# Patient Record
Sex: Female | Born: 1950 | Race: Black or African American | Hispanic: No | Smoking: Never smoker
Health system: Southern US, Community
[De-identification: ages and names within clinical notes are randomized; demographics above are authoritative.]

## PROBLEM LIST (undated history)

## (undated) DIAGNOSIS — C801 Malignant (primary) neoplasm, unspecified: Secondary | ICD-10-CM

## (undated) DIAGNOSIS — E739 Lactose intolerance, unspecified: Secondary | ICD-10-CM

## (undated) DIAGNOSIS — E119 Type 2 diabetes mellitus without complications: Secondary | ICD-10-CM

## (undated) DIAGNOSIS — J309 Allergic rhinitis, unspecified: Secondary | ICD-10-CM

## (undated) DIAGNOSIS — J45909 Unspecified asthma, uncomplicated: Secondary | ICD-10-CM

## (undated) DIAGNOSIS — T7840XA Allergy, unspecified, initial encounter: Secondary | ICD-10-CM

## (undated) DIAGNOSIS — E785 Hyperlipidemia, unspecified: Secondary | ICD-10-CM

## (undated) DIAGNOSIS — C50919 Malignant neoplasm of unspecified site of unspecified female breast: Secondary | ICD-10-CM

## (undated) DIAGNOSIS — M1990 Unspecified osteoarthritis, unspecified site: Secondary | ICD-10-CM

## (undated) DIAGNOSIS — R51 Headache: Secondary | ICD-10-CM

## (undated) DIAGNOSIS — K219 Gastro-esophageal reflux disease without esophagitis: Secondary | ICD-10-CM

## (undated) DIAGNOSIS — Z5189 Encounter for other specified aftercare: Secondary | ICD-10-CM

## (undated) DIAGNOSIS — I10 Essential (primary) hypertension: Secondary | ICD-10-CM

## (undated) DIAGNOSIS — E669 Obesity, unspecified: Secondary | ICD-10-CM

## (undated) DIAGNOSIS — D649 Anemia, unspecified: Secondary | ICD-10-CM

## (undated) HISTORY — DX: Encounter for other specified aftercare: Z51.89

## (undated) HISTORY — PX: KNEE SURGERY: SHX244

## (undated) HISTORY — DX: Anemia, unspecified: D64.9

## (undated) HISTORY — DX: Headache: R51

## (undated) HISTORY — DX: Lactose intolerance, unspecified: E73.9

## (undated) HISTORY — DX: Obesity, unspecified: E66.9

## (undated) HISTORY — DX: Unspecified asthma, uncomplicated: J45.909

## (undated) HISTORY — PX: BREAST LUMPECTOMY: SHX2

## (undated) HISTORY — DX: Allergic rhinitis, unspecified: J30.9

## (undated) HISTORY — PX: ABDOMINAL HYSTERECTOMY: SHX81

## (undated) HISTORY — DX: Essential (primary) hypertension: I10

## (undated) HISTORY — DX: Allergy, unspecified, initial encounter: T78.40XA

## (undated) HISTORY — DX: Unspecified osteoarthritis, unspecified site: M19.90

## (undated) HISTORY — PX: COLONOSCOPY: SHX174

## (undated) HISTORY — DX: Hyperlipidemia, unspecified: E78.5

---

## 1989-02-22 DIAGNOSIS — Z5189 Encounter for other specified aftercare: Secondary | ICD-10-CM

## 1989-02-22 HISTORY — DX: Encounter for other specified aftercare: Z51.89

## 1997-06-27 ENCOUNTER — Other Ambulatory Visit: Admission: RE | Admit: 1997-06-27 | Discharge: 1997-06-27 | Payer: Self-pay | Admitting: Obstetrics and Gynecology

## 1998-07-16 ENCOUNTER — Ambulatory Visit (HOSPITAL_COMMUNITY): Admission: RE | Admit: 1998-07-16 | Discharge: 1998-07-16 | Payer: Self-pay | Admitting: Obstetrics and Gynecology

## 1998-07-16 ENCOUNTER — Encounter: Payer: Self-pay | Admitting: Obstetrics and Gynecology

## 1998-07-18 ENCOUNTER — Other Ambulatory Visit: Admission: RE | Admit: 1998-07-18 | Discharge: 1998-07-18 | Payer: Self-pay | Admitting: Obstetrics and Gynecology

## 1999-08-07 ENCOUNTER — Encounter: Payer: Self-pay | Admitting: Obstetrics and Gynecology

## 1999-08-07 ENCOUNTER — Ambulatory Visit (HOSPITAL_COMMUNITY): Admission: RE | Admit: 1999-08-07 | Discharge: 1999-08-07 | Payer: Self-pay | Admitting: Obstetrics and Gynecology

## 2000-08-08 ENCOUNTER — Ambulatory Visit (HOSPITAL_COMMUNITY): Admission: RE | Admit: 2000-08-08 | Discharge: 2000-08-08 | Payer: Self-pay | Admitting: Obstetrics and Gynecology

## 2000-08-08 ENCOUNTER — Encounter: Payer: Self-pay | Admitting: Obstetrics and Gynecology

## 2001-11-24 ENCOUNTER — Other Ambulatory Visit: Admission: RE | Admit: 2001-11-24 | Discharge: 2001-11-24 | Payer: Self-pay | Admitting: Obstetrics and Gynecology

## 2001-12-06 ENCOUNTER — Encounter: Payer: Self-pay | Admitting: Obstetrics and Gynecology

## 2001-12-06 ENCOUNTER — Ambulatory Visit (HOSPITAL_COMMUNITY): Admission: RE | Admit: 2001-12-06 | Discharge: 2001-12-06 | Payer: Self-pay | Admitting: Family Medicine

## 2002-12-21 ENCOUNTER — Ambulatory Visit (HOSPITAL_COMMUNITY): Admission: RE | Admit: 2002-12-21 | Discharge: 2002-12-21 | Payer: Self-pay | Admitting: Obstetrics and Gynecology

## 2003-12-23 ENCOUNTER — Ambulatory Visit (HOSPITAL_COMMUNITY): Admission: RE | Admit: 2003-12-23 | Discharge: 2003-12-23 | Payer: Self-pay | Admitting: Obstetrics and Gynecology

## 2004-10-25 ENCOUNTER — Encounter: Admission: RE | Admit: 2004-10-25 | Discharge: 2004-10-25 | Payer: Self-pay | Admitting: Emergency Medicine

## 2004-12-25 ENCOUNTER — Ambulatory Visit (HOSPITAL_COMMUNITY): Admission: RE | Admit: 2004-12-25 | Discharge: 2004-12-25 | Payer: Self-pay | Admitting: Emergency Medicine

## 2005-04-21 ENCOUNTER — Encounter: Admission: RE | Admit: 2005-04-21 | Discharge: 2005-04-21 | Payer: Self-pay | Admitting: Emergency Medicine

## 2005-05-05 ENCOUNTER — Encounter: Admission: RE | Admit: 2005-05-05 | Discharge: 2005-05-05 | Payer: Self-pay | Admitting: Emergency Medicine

## 2005-12-28 ENCOUNTER — Ambulatory Visit (HOSPITAL_COMMUNITY): Admission: RE | Admit: 2005-12-28 | Discharge: 2005-12-28 | Payer: Self-pay | Admitting: Emergency Medicine

## 2006-02-22 LAB — HM COLONOSCOPY

## 2006-08-05 ENCOUNTER — Ambulatory Visit (HOSPITAL_COMMUNITY): Admission: RE | Admit: 2006-08-05 | Discharge: 2006-08-05 | Payer: Self-pay | Admitting: Gastroenterology

## 2007-08-14 ENCOUNTER — Encounter: Payer: Self-pay | Admitting: Internal Medicine

## 2007-12-18 ENCOUNTER — Ambulatory Visit (HOSPITAL_COMMUNITY): Admission: RE | Admit: 2007-12-18 | Discharge: 2007-12-18 | Payer: Self-pay | Admitting: Obstetrics and Gynecology

## 2008-05-15 ENCOUNTER — Ambulatory Visit: Payer: Self-pay | Admitting: Internal Medicine

## 2008-05-15 DIAGNOSIS — R519 Headache, unspecified: Secondary | ICD-10-CM | POA: Insufficient documentation

## 2008-05-15 DIAGNOSIS — M1990 Unspecified osteoarthritis, unspecified site: Secondary | ICD-10-CM | POA: Insufficient documentation

## 2008-05-15 DIAGNOSIS — I10 Essential (primary) hypertension: Secondary | ICD-10-CM

## 2008-05-15 DIAGNOSIS — E739 Lactose intolerance, unspecified: Secondary | ICD-10-CM

## 2008-05-15 DIAGNOSIS — J45909 Unspecified asthma, uncomplicated: Secondary | ICD-10-CM

## 2008-05-15 DIAGNOSIS — R51 Headache: Secondary | ICD-10-CM

## 2008-05-15 DIAGNOSIS — E119 Type 2 diabetes mellitus without complications: Secondary | ICD-10-CM | POA: Insufficient documentation

## 2008-05-15 DIAGNOSIS — J309 Allergic rhinitis, unspecified: Secondary | ICD-10-CM | POA: Insufficient documentation

## 2008-05-15 DIAGNOSIS — J4520 Mild intermittent asthma, uncomplicated: Secondary | ICD-10-CM | POA: Insufficient documentation

## 2008-05-15 HISTORY — DX: Essential (primary) hypertension: I10

## 2008-05-15 HISTORY — DX: Allergic rhinitis, unspecified: J30.9

## 2008-05-15 HISTORY — DX: Unspecified asthma, uncomplicated: J45.909

## 2008-05-15 HISTORY — DX: Headache: R51

## 2008-05-15 HISTORY — DX: Unspecified osteoarthritis, unspecified site: M19.90

## 2008-05-15 HISTORY — DX: Lactose intolerance, unspecified: E73.9

## 2008-12-19 ENCOUNTER — Ambulatory Visit (HOSPITAL_COMMUNITY): Admission: RE | Admit: 2008-12-19 | Discharge: 2008-12-19 | Payer: Self-pay | Admitting: Emergency Medicine

## 2009-05-22 ENCOUNTER — Telehealth: Payer: Self-pay | Admitting: Internal Medicine

## 2009-06-05 ENCOUNTER — Ambulatory Visit: Payer: Self-pay | Admitting: Internal Medicine

## 2009-06-05 LAB — CONVERTED CEMR LAB
Bilirubin Urine: NEGATIVE
Blood in Urine, dipstick: NEGATIVE
Glucose, Urine, Semiquant: NEGATIVE
Ketones, urine, test strip: NEGATIVE
Nitrite: NEGATIVE
Protein, U semiquant: NEGATIVE
Specific Gravity, Urine: 1.02
Urobilinogen, UA: 0.2
WBC Urine, dipstick: NEGATIVE
pH: 5.5

## 2009-06-06 LAB — CONVERTED CEMR LAB
ALT: 22 units/L (ref 0–35)
AST: 22 units/L (ref 0–37)
Albumin: 4 g/dL (ref 3.5–5.2)
Alkaline Phosphatase: 64 units/L (ref 39–117)
BUN: 10 mg/dL (ref 6–23)
Basophils Absolute: 0 10*3/uL (ref 0.0–0.1)
Basophils Relative: 0.5 % (ref 0.0–3.0)
Bilirubin, Direct: 0 mg/dL (ref 0.0–0.3)
CO2: 30 meq/L (ref 19–32)
Calcium: 9.6 mg/dL (ref 8.4–10.5)
Chloride: 102 meq/L (ref 96–112)
Cholesterol: 192 mg/dL (ref 0–200)
Creatinine, Ser: 0.6 mg/dL (ref 0.4–1.2)
Eosinophils Absolute: 0.1 10*3/uL (ref 0.0–0.7)
Eosinophils Relative: 1.9 % (ref 0.0–5.0)
GFR calc non Af Amer: 130.84 mL/min (ref >60)
Glucose, Bld: 134 mg/dL — ABNORMAL HIGH (ref 70–99)
HCT: 39.2 % (ref 36.0–46.0)
HDL: 48.8 mg/dL (ref >39.00)
Hemoglobin: 13.6 g/dL (ref 12.0–15.0)
LDL Cholesterol: 119 mg/dL — ABNORMAL HIGH (ref 0–99)
Lymphocytes Relative: 26.8 % (ref 12.0–46.0)
Lymphs Abs: 1.8 10*3/uL (ref 0.7–4.0)
MCHC: 34.7 g/dL (ref 30.0–36.0)
MCV: 92.4 fL (ref 78.0–100.0)
Monocytes Absolute: 0.5 10*3/uL (ref 0.1–1.0)
Monocytes Relative: 7.8 % (ref 3.0–12.0)
Neutro Abs: 4.2 10*3/uL (ref 1.4–7.7)
Neutrophils Relative %: 63 % (ref 43.0–77.0)
Platelets: 202 10*3/uL (ref 150.0–400.0)
Potassium: 2.9 meq/L — ABNORMAL LOW (ref 3.5–5.1)
RBC: 4.24 M/uL (ref 3.87–5.11)
RDW: 13.8 % (ref 11.5–14.6)
Sodium: 143 meq/L (ref 135–145)
TSH: 1.44 microintl units/mL (ref 0.35–5.50)
Total Bilirubin: 0.4 mg/dL (ref 0.3–1.2)
Total CHOL/HDL Ratio: 4
Total Protein: 8 g/dL (ref 6.0–8.3)
Triglycerides: 122 mg/dL (ref 0.0–149.0)
VLDL: 24.4 mg/dL (ref 0.0–40.0)
WBC: 6.6 10*3/uL (ref 4.5–10.5)

## 2009-06-12 ENCOUNTER — Ambulatory Visit: Payer: Self-pay | Admitting: Internal Medicine

## 2009-09-03 ENCOUNTER — Ambulatory Visit (HOSPITAL_COMMUNITY): Admission: RE | Admit: 2009-09-03 | Discharge: 2009-09-03 | Payer: Self-pay | Admitting: Obstetrics & Gynecology

## 2009-12-11 ENCOUNTER — Ambulatory Visit: Payer: Self-pay | Admitting: Internal Medicine

## 2009-12-22 ENCOUNTER — Ambulatory Visit (HOSPITAL_COMMUNITY): Admission: RE | Admit: 2009-12-22 | Discharge: 2009-12-22 | Payer: Self-pay | Admitting: Internal Medicine

## 2009-12-22 LAB — HM MAMMOGRAPHY: HM Mammogram: NEGATIVE
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
## 2010-03-26 NOTE — Assessment & Plan Note (Signed)
Summary: 6 month fup//ccm   Vital Signs:  Mclaughlin profile:   60 year old female Height:      63 inches Weight:      272 pounds BMI:     48.36 Temp:     98.2 degrees F oral Pulse rate:   80 / minute Resp:     14 per minute BP sitting:   120 / 70  (left arm) Cuff size:   large  Vitals Entered By: Willy Eddy, LPN (December 11, 2009 10:39 AM) CC: 6 month roa Is Mclaughlin Diabetic? No   CC:  6 month roa.  History of Present Illness: Darlene Mclaughlin who is seen today for follow up of her hypertension.  She has a history of exogenous obesity and has lost 8 pounds since her last visit here.  She has a history of headaches and a history of mild episodic asthma.  No new concerns or complaints.  She feels quite well.  Today, no asthma  Preventive Screening-Counseling & Management  Alcohol-Tobacco     Smoking Status: never  Current Problems (verified): 1)  Preventive Health Care  (ICD-V70.0) 2)  Impaired Glucose Tolerance  (ICD-271.3) 3)  Osteoarthritis  (ICD-715.90) 4)  Hypertension  (ICD-401.9) 5)  Headache  (ICD-784.0) 6)  Asthma  (ICD-493.90) 7)  Allergic Rhinitis  (ICD-477.9)  Current Medications (verified): 1)  Meloxicam 15 Mg Tabs (Meloxicam) .Marland Kitchen.. 1 Once Daily 2)  Cyclobenzaprine Hcl 5 Mg Tabs (Cyclobenzaprine Hcl) .Marland Kitchen.. 1 Three Times A Day As Needed 3)  Chlorthalidone 25 Mg Tabs (Chlorthalidone) .Marland Kitchen.. 1 Once Daily 4)  Klor-Con M20 20 Meq Cr-Tabs (Potassium Chloride Crys Cr) .... Two Times A Day For 2 Weeks Then Qd  Allergies (verified): 1)  ! Naproxen Dr (Naproxen)  Contraindications/Deferment of Procedures/Staging:    Test/Procedure: FLU VAX    Reason for deferment: Mclaughlin declined   Past History:  Past Medical History: Reviewed history from 05/15/2008 and no changes required. Allergic rhinitis Asthma Headache Hypertension Osteoarthritis impaired glucose tolerance exogenous obesity  Past Surgical History: Reviewed history from 06/12/2009 and no  changes required. gravida two, para two, abortus zero, status post C-sections 1972 in 1979 Hysterectomy 1992 arthroscopic left knee surgery x 2 colonoscopy [address])  Review of Systems       The Mclaughlin complains of weight loss.  The Mclaughlin denies anorexia, fever, weight gain, vision loss, decreased hearing, hoarseness, chest pain, syncope, dyspnea on exertion, peripheral edema, prolonged cough, headaches, hemoptysis, abdominal pain, melena, hematochezia, severe indigestion/heartburn, hematuria, incontinence, genital sores, muscle weakness, suspicious skin lesions, transient blindness, difficulty walking, depression, unusual weight change, abnormal bleeding, enlarged lymph nodes, angioedema, and breast masses.    Physical Exam  General:  overweight-appearing.  130/78overweight-appearing.   Head:  Normocephalic and atraumatic without obvious abnormalities. No apparent alopecia or balding. Eyes:  No corneal or conjunctival inflammation noted. EOMI. Perrla. Funduscopic exam benign, without hemorrhages, exudates or papilledema. Vision grossly normal. Ears:  External ear exam shows no significant lesions or deformities.  Otoscopic examination reveals clear canals, tympanic membranes are intact bilaterally without bulging, retraction, inflammation or discharge. Hearing is grossly normal bilaterally. Mouth:  Oral mucosa and oropharynx without lesions or exudates.  Teeth in good repair. Neck:  No deformities, masses, or tenderness noted. Lungs:  Normal respiratory effort, chest expands symmetrically. Lungs are clear to auscultation, no crackles or wheezes. Heart:  Normal rate and regular rhythm. S1 and S2 normal without gallop, murmur, click, rub or other extra sounds. Abdomen:  Bowel sounds positive,abdomen  soft and non-tender without masses, organomegaly or hernias noted. Msk:  No deformity or scoliosis noted of thoracic or lumbar spine.   Pulses:  R and L carotid,radial,femoral,dorsalis pedis and  posterior tibial pulses are full and equal bilaterally   Impression & Recommendations:  Problem # 1:  IMPAIRED GLUCOSE TOLERANCE (ICD-271.3)  Problem # 2:  OSTEOARTHRITIS (ICD-715.90)  Her updated medication list for this problem includes:    Meloxicam 15 Mg Tabs (Meloxicam) .Marland Kitchen... 1 once daily  Her updated medication list for this problem includes:    Meloxicam 15 Mg Tabs (Meloxicam) .Marland Kitchen... 1 once daily  Problem # 3:  HYPERTENSION (ICD-401.9)  Her updated medication list for this problem includes:    Chlorthalidone 25 Mg Tabs (Chlorthalidone) .Marland Kitchen... 1 once daily  Her updated medication list for this problem includes:    Chlorthalidone 25 Mg Tabs (Chlorthalidone) .Marland Kitchen... 1 once daily  Complete Medication List: 1)  Meloxicam 15 Mg Tabs (Meloxicam) .Marland Kitchen.. 1 once daily 2)  Cyclobenzaprine Hcl 5 Mg Tabs (Cyclobenzaprine hcl) .Marland Kitchen.. 1 three times a day as needed 3)  Chlorthalidone 25 Mg Tabs (Chlorthalidone) .Marland Kitchen.. 1 once daily 4)  Klor-con M20 20 Meq Cr-tabs (Potassium chloride crys cr) .... Two times a day for 2 weeks then qd  Mclaughlin Instructions: 1)  Please schedule a follow-up appointment in 6 months for CPX  2)  Limit your Sodium (Salt) to less than 2 grams a day(slightly less than 1/2 a teaspoon) to prevent fluid retention, swelling, or worsening of symptoms. 3)  It is important that you exercise regularly at least 20 minutes 5 times a week. If you develop chest pain, have severe difficulty breathing, or feel very tired , stop exercising immediately and seek medical attention. 4)  You need to lose weight. Consider a lower calorie diet and regular exercise.  Prescriptions: KLOR-CON M20 20 MEQ CR-TABS (POTASSIUM CHLORIDE CRYS CR) two times a day for 2 weeks then qd  #90 x 6   Entered and Authorized by:   Gordy Savers  MD   Signed by:   Gordy Savers  MD on 12/11/2009   Method used:   [facility]        [facility] [address] [phone]* (retail)       [address]       [place], [place]  [address]       Ph: [phone] or [phone]       Fax: [phone]   RxID:   [id number] CHLORTHALIDONE 25 MG TABS (CHLORTHALIDONE) 1 once daily  #90 x 5   Entered and Authorized by:   Gordy Savers  MD   Signed by:   Gordy Savers  MD on 12/11/2009   Method used:   [facility]        [facility]  [address] [phone]* (retail)       [address]       [place], [place]  [address]       Ph: [phone] or [phone]       Fax: [phone]   RxID:   [id number] CYCLOBENZAPRINE HCL 5 MG TABS (CYCLOBENZAPRINE HCL) 1 three times a day as needed  #90 x 4   Entered and Authorized by:   Gordy Savers  MD   Signed by:   Gordy Savers  MD on 12/11/2009   Method used:   [facility]  [facility]        [facility] [address] [phone]* (retail)       [address]       [place], [place]  [address]       Ph: [phone] or [phone]       Fax: [phone]   RxID:   [id number] MELOXICAM 15 MG TABS (MELOXICAM) 1 once daily  #90 x 6   Entered and Authorized by:   Gordy Savers  MD   Signed by:   Gordy Savers  MD on 12/11/2009   Method used:   [facility]        [facility] [address] [phone]* (retail)       [address]       [place], [place]  [address]       Ph: [phone] or [phone]       Fax: [phone]   RxID:   [id number]    Orders Added: 1)  Est. [facility] Level III [[address]]

## 2010-03-26 NOTE — Progress Notes (Signed)
Summary: refill chlorthalidone  Phone Note Call from Patient Call back at Home Phone [phone]   Caller: Patient Call For: Darlene Savers  MD Summary of Call: chlorthalidone 25 mg call [facility] [address] [phone] pt has 2 pills left. Initial call taken by: [name],  May 22, 2009 9:22 AM    Prescriptions: CHLORTHALIDONE 25 MG TABS (CHLORTHALIDONE) 1 once daily  #30 x 1   Entered by:   [name] LPN   Authorized by:   Darlene Savers  MD   Signed by:   [name] LPN on 44/04/4740   Method used:   [facility]        [facility]  [address] [phone]* (retail)       [address]       [place], [place]  [address]       Ph: [phone] or [phone]       Fax: [phone]   RxID:   [id number]

## 2010-03-26 NOTE — Letter (Signed)
Summary: Records from Dr. Janeth Rase Office 2007 thru 2009  Records from Dr. Janeth Rase Office 2007 thru 2009   Imported By: Maryln Gottron 05/22/2008 13:19:26  _____________________________________________________________________  External Attachment:    Type:   Image     Comment:   External Document

## 2010-03-26 NOTE — Assessment & Plan Note (Signed)
Summary: CPX // RS   Vital Signs:  Patient profile:   60 year old female Height:      63 inches Weight:      280 pounds BMI:     49.78 Temp:     98.0 degrees F oral BP sitting:   118 / 80  (left arm) Cuff size:   large  Vitals Entered By: [name] LPN (June 12, 2009 1:13 PM) CC: cpx - pap 05/2009 norn , mammo 01/2009 norm , colo 2008 norm return 5-10 yrs Is Patient Diabetic? No   CC:  cpx - pap 05/2009 norn , mammo 01/2009 norm , and colo 2008 norm return 5-10 yrs.  History of Present Illness: 60 year old female, whose medical problems include hypertension impaired glucose tolerance.  Exogenous obesity, and arthritis.  She has a history of mild asthma and allergic rhinitis, which has been stable.  She has done quite well over the past year.  She has been adjusted to the death of her husband from cerebrovascular disease in September of last year.  She is a recent grandmother.  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  Preventive Screening-Counseling & Management  Alcohol-Tobacco     Smoking Status: never  Allergies: 1)  ! Naproxen Dr (Naproxen)  Past History:  Past Medical History: Reviewed history from 05/15/2008 and no changes required. Allergic rhinitis Asthma Headache Hypertension Osteoarthritis impaired glucose tolerance exogenous obesity  Past Surgical History: gravida two, para two, abortus zero, status post C-sections 1972 in 1979 Hysterectomy 1992 arthroscopic left knee surgery x 2 colonoscopy [address])  Family History: Reviewed history from 05/15/2008 and no changes required. father died at 83, a sudden cardiac death with a history of hypercholesterolemia mother, age 62 has type 2 diabetes and hypertension  Four brothers, one deceased from complications of diabetes also positive for hypertension no sisters  Social History: Reviewed history from 05/15/2008 and no changes required. 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    Physical Exam  General:  overweight-appearing.  110/74 Head:  Normocephalic and atraumatic without obvious abnormalities. No apparent alopecia or balding. Eyes:  No corneal or conjunctival inflammation noted. EOMI. Perrla. Funduscopic exam benign, without hemorrhages, exudates or papilledema. Vision grossly normal. Ears:  External ear exam shows no significant lesions or deformities.  Otoscopic examination reveals clear canals, tympanic membranes are intact bilaterally without bulging, retraction, inflammation or discharge. Hearing is grossly normal bilaterally. Nose:  External nasal examination shows no deformity or inflammation. Nasal mucosa are pink and moist without lesions or exudates. Mouth:  Oral mucosa and oropharynx without lesions or exudates.  Teeth in good repair. Neck:  No deformities, masses, or tenderness noted. Chest Wall:  No deformities, masses, or tenderness noted. Breasts:  No mass, nodules, thickening, tenderness, bulging, retraction, inflamation, nipple discharge or skin changes noted.   Lungs:  Normal respiratory effort, chest expands symmetrically. Lungs are clear to auscultation, no crackles or wheezes. Heart:  Normal rate and regular rhythm. S1 and  S2 normal without gallop, murmur, click, rub or other extra  sounds. Abdomen:  Bowel sounds positive,abdomen soft and non-tender without masses, organomegaly or hernias noted. Msk:  No deformity or scoliosis noted of thoracic or lumbar spine.   Pulses:  R and L carotid,radial,femoral,dorsalis pedis and posterior tibial pulses are full and equal bilaterally Extremities:  No clubbing, cyanosis, edema, or deformity noted with normal full range of motion of all joints.   Neurologic:  No cranial nerve deficits noted. Station and gait are normal. Plantar reflexes are down-going bilaterally. DTRs are symmetrical throughout. Sensory, motor and coordinative functions appear intact. Skin:  Intact without suspicious lesions or rashes Cervical Nodes:  No lymphadenopathy noted Axillary Nodes:  No palpable lymphadenopathy Inguinal Nodes:  No significant adenopathy Psych:  Cognition and judgment appear intact. Alert and cooperative with normal attention span and concentration. No apparent delusions, illusions, hallucinations   Impression & Recommendations:  Problem # 1:  Preventive Health Care (ICD-V70.0)  Complete Medication List: 1)  Meloxicam 15 Mg Tabs (Meloxicam) .Marland Kitchen.. 1 once daily 2)  Cyclobenzaprine Hcl 5 Mg Tabs (Cyclobenzaprine hcl) .Marland Kitchen.. 1 three times a day as needed 3)  Chlorthalidone 25 Mg Tabs (Chlorthalidone) .Marland Kitchen.. 1 once daily 4)  Klor-con M20 20 Meq Cr-tabs (Potassium chloride crys cr) .... Two times a day for 2 weeks then qd  Other Orders: EKG w/ Interpretation (93000) Prescription Created Electronically [id number])  Patient Instructions: 1)  Limit your Sodium (Salt). 2)  It is important that you exercise regularly at least 20 minutes 5 times a week. If you develop chest pain, have severe difficulty breathing, or feel very tired , stop exercising immediately and seek medical attention. 3)  You need to lose weight. Consider a lower calorie diet and regular exercise.  4)  Check your Blood Pressure regularly. If it is above: you should make an  appointment. 5)  Please schedule a follow-up appointment in 6 months. Prescriptions: KLOR-CON M20 20 MEQ CR-TABS (POTASSIUM CHLORIDE CRYS CR) two times a day for 2 weeks then qd  #90 x 6   Entered and Authorized by:   Gordy Savers  MD   Signed by:   Gordy Savers  MD on 06/12/2009   Method used:   [facility]        [facility]  [address] [phone]* (retail)       [address]       [place], [place]  [address]       Ph: [phone] or [phone]       Fax: [phone]   RxID:   [id number] CHLORTHALIDONE 25 MG TABS (CHLORTHALIDONE) 1 once daily  #90 x 6   Entered and Authorized by:   Gordy Savers  MD   Signed by:   Gordy Savers  MD on 06/12/2009   Method used:   [facility]        [facility]  [address] [phone]* (retail)       [address]       [place], [place]  [address]       Ph: [phone] or [phone]       Fax: [phone]   RxID:   [id number] MELOXICAM 15 MG TABS (MELOXICAM) 1 once daily  #90 x 6   Entered and Authorized by:   Gordy Savers  MD   Signed by:   Gordy Savers  MD on 06/12/2009   Method used:   [facility] to        [facility] [phone]* (retail)       [address]       [place], [place]  [address]       Ph: [phone] or [phone]       Fax: [phone]   RxID:   [id number]

## 2010-06-15 ENCOUNTER — Encounter: Payer: Self-pay | Admitting: Internal Medicine

## 2010-06-17 ENCOUNTER — Encounter: Payer: Self-pay | Admitting: Internal Medicine

## 2010-06-18 ENCOUNTER — Encounter: Payer: Self-pay | Admitting: Internal Medicine

## 2010-06-18 ENCOUNTER — Ambulatory Visit (INDEPENDENT_AMBULATORY_CARE_PROVIDER_SITE_OTHER): Payer: PRIVATE HEALTH INSURANCE | Admitting: Internal Medicine

## 2010-06-18 VITALS — BP 124/82 | HR 90 | Temp 98.1°F | Resp 20 | Ht 63.5 in | Wt 273.0 lb

## 2010-06-18 DIAGNOSIS — I10 Essential (primary) hypertension: Secondary | ICD-10-CM

## 2010-06-18 DIAGNOSIS — E739 Lactose intolerance, unspecified: Secondary | ICD-10-CM

## 2010-06-18 DIAGNOSIS — M1990 Unspecified osteoarthritis, unspecified site: Secondary | ICD-10-CM

## 2010-06-18 DIAGNOSIS — Z0000 Encounter for general adult medical examination without abnormal findings: Secondary | ICD-10-CM

## 2010-06-18 LAB — BASIC METABOLIC PANEL
BUN: 11 mg/dL (ref 6–23)
CO2: 30 mEq/L (ref 19–32)
Calcium: 9.3 mg/dL (ref 8.4–10.5)
Chloride: 99 mEq/L (ref 96–112)
Creatinine, Ser: 0.6 mg/dL (ref 0.4–1.2)
GFR: 125.55 mL/min (ref >60.00)
Glucose, Bld: 165 mg/dL — ABNORMAL HIGH (ref 70–99)
Potassium: 3.4 mEq/L — ABNORMAL LOW (ref 3.5–5.1)
Sodium: 140 mEq/L (ref 135–145)

## 2010-06-18 LAB — HEPATIC FUNCTION PANEL
ALT: 18 U/L (ref 0–35)
AST: 20 U/L (ref 0–37)
Albumin: 3.7 g/dL (ref 3.5–5.2)
Alkaline Phosphatase: 68 U/L (ref 39–117)
Bilirubin, Direct: 0.1 mg/dL (ref 0.0–0.3)
Total Bilirubin: 0.7 mg/dL (ref 0.3–1.2)
Total Protein: 7.3 g/dL (ref 6.0–8.3)

## 2010-06-18 LAB — CBC WITH DIFFERENTIAL/PLATELET
Basophils Absolute: 0 10*3/uL (ref 0.0–0.1)
Basophils Relative: 0.3 % (ref 0.0–3.0)
Eosinophils Absolute: 0.1 10*3/uL (ref 0.0–0.7)
Eosinophils Relative: 1.8 % (ref 0.0–5.0)
HCT: 38.4 % (ref 36.0–46.0)
Hemoglobin: 13.1 g/dL (ref 12.0–15.0)
Lymphocytes Relative: 27 % (ref 12.0–46.0)
Lymphs Abs: 1.8 10*3/uL (ref 0.7–4.0)
MCHC: 34 g/dL (ref 30.0–36.0)
MCV: 93.3 fl (ref 78.0–100.0)
Monocytes Absolute: 0.5 10*3/uL (ref 0.1–1.0)
Monocytes Relative: 7.8 % (ref 3.0–12.0)
Neutro Abs: 4.2 10*3/uL (ref 1.4–7.7)
Neutrophils Relative %: 63.1 % (ref 43.0–77.0)
Platelets: 190 10*3/uL (ref 150.0–400.0)
RBC: 4.11 Mil/uL (ref 3.87–5.11)
RDW: 13.7 % (ref 11.5–14.6)
WBC: 6.7 10*3/uL (ref 4.5–10.5)

## 2010-06-18 LAB — HEMOGLOBIN A1C: Hgb A1c MFr Bld: 8.9 % — ABNORMAL HIGH (ref 4.6–6.5)

## 2010-06-18 LAB — LIPID PANEL
Cholesterol: 190 mg/dL (ref 0–200)
HDL: 47.6 mg/dL (ref >39.00)
LDL Cholesterol: 119 mg/dL — ABNORMAL HIGH (ref 0–99)
Total CHOL/HDL Ratio: 4
Triglycerides: 117 mg/dL (ref 0.0–149.0)
VLDL: 23.4 mg/dL (ref 0.0–40.0)

## 2010-06-18 LAB — TSH: TSH: 1.02 u[IU]/mL (ref 0.35–5.50)

## 2010-06-18 MED ORDER — CHLORTHALIDONE 25 MG PO TABS: 25.0000 mg | ORAL_TABLET | Freq: Every day | ORAL | Status: DC

## 2010-06-18 MED ORDER — POTASSIUM CHLORIDE CRYS ER 20 MEQ PO TBCR: 20.0000 meq | EXTENDED_RELEASE_TABLET | Freq: Every day | ORAL | Status: DC

## 2010-06-18 MED ORDER — CYCLOBENZAPRINE HCL 5 MG PO TABS: 5.0000 mg | ORAL_TABLET | Freq: Three times a day (TID) | ORAL | Status: DC | PRN

## 2010-06-18 NOTE — Progress Notes (Signed)
Subjective:    Patient ID: Darlene Mclaughlin, female    DOB: March 29, 1950, 60 y.o.   MRN: [id number]  HPI This is a 60 year old patient who is seen today for a wellness exam. She was seen by gynecology 2 days ago. She has a history of fibroids and is status post remote hysterectomy. She has hypertension mild asthma and allergic rhinitis which have been stable. She has exogenous obesity and a history of impaired glucose tolerance she has mild osteoarthritis. Her blood pressure has been well controlled on diuretic therapy. Family history is remarkable for a history of breast cancer hypertension and diabetes    CC: cpx - pap 05/2009 norn , mammo 01/2009 norm , and colo 2008 norm return 5-10 yrs.  History of Present Illness:  60 year old female, whose medical problems include hypertension impaired glucose tolerance. Exogenous obesity, and arthritis. She has a history of mild asthma and allergic rhinitis, which has been stable. She has done quite well over the past year. She has been adjusted to the death of her husband from cerebrovascular disease in September of last year. She is a recent grandmother. 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  Preventive Screening-Counseling & Management  Alcohol-Tobacco  Smoking Status: never  Allergies:  1) ! Naproxen Dr (Naproxen)  Past History:  Past Medical History:  Reviewed history from 05/15/2008 and no changes required.  Allergic rhinitis  Asthma  Headache  Hypertension  Osteoarthritis  impaired glucose tolerance  exogenous obesity  Past Surgical History:  gravida two, para two, abortus zero, status post C-sections 1972 in 1979  Hysterectomy 1992  arthroscopic left knee surgery x 2  colonoscopy [address])  Family History:  Reviewed history from 05/15/2008 and no changes required.  father died at 8, a sudden cardiac death with a history of hypercholesterolemia  mother, age 63 has type 2 diabetes and  hypertension  Four brothers, one deceased from complications of diabetes also positive for hypertension  no sisters  Social History:  Reviewed history from 05/15/2008 and no changes required.  Married-widowed 9-10  husband is disabled due to left brain stroke with chronic right hip  two sons- one grandchild  school bus driver  Never Smoked  Review of Systems   Review of Systems  Constitutional: Negative for fever, appetite change, fatigue and unexpected weight change.  HENT: Negative for hearing loss, ear pain, nosebleeds, congestion, sore throat, mouth sores, trouble swallowing, neck stiffness, dental problem, voice change, sinus pressure and tinnitus.   Eyes: Negative for photophobia, pain, redness and visual disturbance.  Respiratory: Negative for cough, chest tightness and shortness of breath.   Cardiovascular: Negative for chest pain, palpitations and leg swelling.  Gastrointestinal: Negative for nausea, vomiting, abdominal pain, diarrhea, constipation, blood in stool, abdominal distention and rectal pain.  Genitourinary: Negative for dysuria, urgency, frequency, hematuria, flank pain, vaginal bleeding, vaginal discharge, difficulty urinating, genital sores, vaginal pain, menstrual problem and pelvic pain.  Musculoskeletal: Negative for back pain and arthralgias.  Skin: Negative for rash.  Neurological: Negative for dizziness, syncope, speech difficulty, weakness, light-headedness, numbness and headaches.  Hematological: Negative for adenopathy. Does not bruise/bleed easily.  Psychiatric/Behavioral: Negative for suicidal ideas, behavioral problems, self-injury, dysphoric mood and agitation. The patient is not nervous/anxious.        Objective:   Physical Exam  Constitutional: She is oriented to person, place, and time. She appears well-developed and well-nourished.       Morbid obesity  HENT:  Head: Normocephalic  and atraumatic.  Right Ear: External ear normal.  Left Ear:  External ear normal.  Mouth/Throat: Oropharynx is clear and moist.  Eyes: Conjunctivae and EOM are normal.  Neck: Normal range of motion. Neck supple. No JVD present. No thyromegaly present.  Cardiovascular: Normal rate, regular rhythm, normal heart sounds and intact distal pulses.   No murmur heard. Pulmonary/Chest: Effort normal and breath sounds normal. She has no wheezes. She has no rales.  Abdominal: Soft. Bowel sounds are normal. She exhibits no distension and no mass. There is no tenderness. There is no rebound and no guarding.  Musculoskeletal: Normal range of motion. She exhibits no edema and no tenderness.  Neurological: She is alert and oriented to person, place, and time. She has normal reflexes. No cranial nerve deficit. She exhibits normal muscle tone. Coordination normal.  Skin: Skin is warm and dry. No rash noted.  Psychiatric: She has a normal mood and affect. Her behavior is normal.          Assessment & Plan:   Annual health exam Exogenous obesity History of impaired glucose tolerance. We'll check a hemoglobin A1c Hypertension stable Mild osteoarthritis stable  Laboratory data reviewed. Will recheck in 6 months weight loss low salt diet exercise all encouraged

## 2010-06-18 NOTE — Patient Instructions (Signed)
You need to lose weight.  Consider a lower calorie diet and regular exercise.  Limit your sodium (Salt) intake    It is important that you exercise regularly, at least 20 minutes 3 to 4 times per week.  If you develop chest pain or shortness of breath seek  medical attention.  Take a calcium supplement, plus [phone] units of vitamin D  Return in 6 months for follow-up

## 2010-06-19 ENCOUNTER — Other Ambulatory Visit: Payer: Self-pay | Admitting: Internal Medicine

## 2010-06-19 MED ORDER — GLIMEPIRIDE 4 MG PO TABS: 4.0000 mg | ORAL_TABLET | ORAL | Status: DC

## 2010-06-19 MED ORDER — METFORMIN HCL ER 500 MG PO TB24: 500.0000 mg | ORAL_TABLET | Freq: Every day | ORAL | Status: DC

## 2010-06-19 NOTE — Progress Notes (Signed)
Quick Note:  Spoke with pt - informed of labs and new meds x2 and how to take - also discussed diet ,exercise and wt loss. Need rov in 4 weeks. KIK ______

## 2010-06-25 ENCOUNTER — Telehealth: Payer: Self-pay | Admitting: Internal Medicine

## 2010-06-25 MED ORDER — METFORMIN HCL ER 500 MG PO TB24: 1000.0000 mg | ORAL_TABLET | Freq: Every day | ORAL | Status: DC

## 2010-06-25 MED ORDER — GLIMEPIRIDE 4 MG PO TABS: 2.0000 mg | ORAL_TABLET | ORAL | Status: DC

## 2010-06-25 NOTE — Telephone Encounter (Signed)
Pt came by office and said that she had lft a vm for nurse on Monday, stating she needs clarification on how to take new meds for diabetes. Pt was instructed to take 1/2 a pill of amaryl per day, but pill is so small. Pt was also told to take 2 of the Metformin per day, but instruction on pill bottle, says to take 1 per day.

## 2010-06-25 NOTE — Telephone Encounter (Signed)
Spoke with pt - discussed meds and how to take - will see that pharmacy has new rx's with correct sig. KIK

## 2010-07-07 NOTE — Op Note (Signed)
Darlene Mclaughlin, Darlene Mclaughlin              ACCOUNT NO.:  [id number]   MEDICAL RECORD NO.:  [id number]          PATIENT TYPE:  AMB   LOCATION:  ENDO                         FACILITY:  [facility]   PHYSICIAN:  Anselmo Rod, M.D.  DATE OF BIRTH:  1950/02/25   DATE OF PROCEDURE:  08/05/2006  DATE OF DISCHARGE:                               OPERATIVE REPORT   PROCEDURE PERFORMED:  Screening colonoscopy.   ENDOSCOPIST:  Anselmo Rod.   INSTRUMENT USED:  Pentax video colonoscope.   INDICATION FOR PROCEDURE:  This 60 year old African-American female  underwent a screening colonoscopy to rule out colonic polyps, masses,  etc.  Screening colonoscopy being done in a 60 year old female who has a  history of rectal bleeding, question hemorrhoids.   PREPROCEDURE PREPARATION:  Informed consent was procured from the  patient.  The patient fasted for 4 hours prior to the procedure after  being prepped with 20 OsmoPrep pills the night of and 12 OsmoPrep on the  morning of the procedure.  Risks and benefits of the procedure,  including a 10% miss rate of cancer and polyp, were discussed with the  patient as well.   PREPROCEDURE PHYSICAL:  The patient had stable vital signs.  NECK:  Supple.  CHEST:  Clear to auscultation.  S1, S2 regular.  ABDOMEN:  Soft with normal bowel sounds.   DESCRIPTION OF THE PROCEDURE:  The patient was placed in the left  lateral decubitus position, sedated with 100 mcg of Fentanyl and 10 mg  of Versed given intravenously in slow incremental doses.  Once the  patient was adequately sedated and maintained on low flow oxygen and  continuous cardiac monitoring, the Pentax video colonoscope was advanced  from the rectum to the cecum.  The appendiceal orifice and ileocecal  valve were clearly visualized and photographed.  The terminal ileum  appeared healthy and without lesions.  No masses, polyps, erosions,  ulcerations or diverticula were seen.  Small internal hemorrhoids were  appreciated on retroflexion in the rectum.  The patient tolerated the  procedure well without immediate complications.   IMPRESSION:  1. Normal colonoscopy of the terminal ileum.  No masses, polyps or      diverticula seen.  2. Small internal hemorrhoids seen on retroflexion.   RECOMMENDATION:  1. Repeat colonoscopy within the next 10 years.  If the patient has      any abnormal symptoms in the interim, she should contact the office      immediately for further recommendations.  2. Outpatient followup as need arises in the future.      Anselmo Rod, M.D.  Electronically Signed     JNM/MEDQ  D:  08/05/2006  T:  08/05/2006  Job:  [id number]   cc:   Reuben Likes, M.D.  Fax: [phone]

## 2010-11-23 ENCOUNTER — Other Ambulatory Visit: Payer: Self-pay | Admitting: Internal Medicine

## 2010-12-10 ENCOUNTER — Telehealth: Payer: Self-pay | Admitting: *Deleted

## 2010-12-10 MED ORDER — METFORMIN HCL ER 500 MG PO TB24: 1000.0000 mg | ORAL_TABLET | Freq: Every day | ORAL | Status: DC

## 2010-12-10 NOTE — Telephone Encounter (Signed)
Spoke with pt - med refilled incorrectly - called [facility] ,spoke with pharmacist and dc's 1 qd and sent new rx for 1000mg  at dinner time

## 2010-12-10 NOTE — Telephone Encounter (Signed)
Pt is requesting a call from [name] re: a dosage change on Metformin.

## 2010-12-18 ENCOUNTER — Ambulatory Visit (INDEPENDENT_AMBULATORY_CARE_PROVIDER_SITE_OTHER): Payer: BC Managed Care – PPO | Admitting: Internal Medicine

## 2010-12-18 ENCOUNTER — Encounter: Payer: Self-pay | Admitting: Internal Medicine

## 2010-12-18 DIAGNOSIS — I10 Essential (primary) hypertension: Secondary | ICD-10-CM

## 2010-12-18 DIAGNOSIS — E119 Type 2 diabetes mellitus without complications: Secondary | ICD-10-CM

## 2010-12-18 LAB — GLUCOSE, POCT (MANUAL RESULT ENTRY): POC Glucose: 120

## 2010-12-18 MED ORDER — CHLORTHALIDONE 25 MG PO TABS: 25.0000 mg | ORAL_TABLET | Freq: Every day | ORAL | Status: DC

## 2010-12-18 MED ORDER — POTASSIUM CHLORIDE CRYS ER 20 MEQ PO TBCR: 20.0000 meq | EXTENDED_RELEASE_TABLET | Freq: Every day | ORAL | Status: DC

## 2010-12-18 MED ORDER — GLIMEPIRIDE 4 MG PO TABS: 2.0000 mg | ORAL_TABLET | ORAL | Status: DC

## 2010-12-18 MED ORDER — METFORMIN HCL ER 500 MG PO TB24: 1000.0000 mg | ORAL_TABLET | Freq: Every day | ORAL | Status: DC

## 2010-12-18 NOTE — Patient Instructions (Addendum)
Limit your sodium (Salt) intake   Please check your hemoglobin A1c every 3 months    It is important that you exercise regularly, at least 20 minutes 3 to 4 times per week.  If you develop chest pain or shortness of breath seek  medical attention.  You need to lose weight.  Consider a lower calorie diet and regular exercise.Diabetes and Exercise Regular exercise is important and can help:    Control blood glucose (sugar).     Decrease blood pressure.     Control blood lipids (cholesterol, triglycerides).     Improve overall health.  BENEFITS FROM EXERCISE  Improved fitness.     Improved flexibility.     Improved endurance.     Increased bone density.     Weight control.     Increased muscle strength.     Decreased body fat.     Improvement of the body's use of insulin, a hormone.     Increased insulin sensitivity.     Reduction of insulin needs.     Reduced stress and tension.     Helps you feel better.  People with diabetes who add exercise to their lifestyle gain additional benefits, including:  Weight loss.     Reduced appetite.     Improvement of the body's use of blood glucose.     Decreased risk factors for heart disease:     Lowering of cholesterol and triglycerides.     Raising the level of good cholesterol (high-density lipoproteins, HDL).     Lowering blood sugar.     Decreased blood pressure.  TYPE 1 DIABETES AND EXERCISE  Exercise will usually lower your blood glucose.     If blood glucose is greater than 240 mg/dl, check urine ketones. If ketones are present, do not exercise.     Location of the insulin injection sites may need to be adjusted with exercise. Avoid injecting insulin into areas of the body that will be exercised. For example, avoid injecting insulin into:     The arms when playing tennis.     The legs when jogging. For more information, discuss this with your caregiver.     Keep a record of:     Food intake.      Type and amount of exercise.     Expected peak times of insulin action.     Blood glucose levels.  Do this before, during, and after exercise. Review your records with your caregiver. This will help you to develop guidelines for adjusting food intake and insulin amounts.   TYPE 2 DIABETES AND EXERCISE  Regular physical activity can help control blood glucose.     Exercise is important because it may:     Increase the body's sensitivity to insulin.     Improve blood glucose control.     Exercise reduces the risk of heart disease. It decreases serum cholesterol and triglycerides. It also lowers blood pressure.     Those who take insulin or oral hypoglycemic agents should watch for signs of hypoglycemia. These signs include dizziness, shaking, sweating, chills, and confusion.     Body water is lost during exercise. It must be replaced. This will help to avoid loss of body fluids (dehydration) or heat stroke.  Be sure to talk to your caregiver before starting an exercise program to make sure it is safe for you. Remember, any activity is better than none.   Document Released: 05/01/2003 Document Revised: 10/21/2010 Document Reviewed: 08/15/2008 [employer] Patient  Information 2012 [place], [place].Diabetes and Standards of Medical Care   Diabetes is complicated. You may find that your diabetes team includes a dietitian, nurse, diabetes educator, eye doctor, and more. To help everyone know what is going on and to help you get the care you deserve, the following schedule of care was developed to help keep you on track. Below are the tests, exams, vaccines, medicines, education, and plans you will need. A1c test  Performed at least 2 times a year if you are meeting treatment goals.     Performed 4 times a year if therapy has changed or if you are not meeting therapy/glycemic goals.  Aspirin medicine  Take daily as directed by your caregiver.  Blood pressure test  Performed at every routine  medical visit. The goal is less than 130/80 mm/Hg.  Dental exam  Get a dental exam at least 2 times a year.  Dilated eye exam (retinal exam)  Type 1 diabetes: Get an exam within 5 years of diagnosis and then yearly.     Type 2 diabetes: Get an exam at diagnosis and then yearly.  All exams thereafter can be extended to every 2 to 3 years if one or more exams have been normal. Foot care exam  Visual foot exams are performed at every routine medical visit. The exams check for cuts, injuries, or other problems with the feet.     A comprehensive foot exam should be done yearly. This includes visual inspection as well as assessing foot pulses and testing for loss of sensation.  Kidney function test (urine microalbumin)  Performed once a year.     Type 1 diabetes: The first test is performed 5 years after diagnosis.     Type 2 diabetes: The first test is performed at the time of diagnosis.     A serum creatinine and estimated glomerular filtration rate (eGFR) test is done once a year to tell the level of chronic kidney disease (CKD), if present.  Lipid profile (Cholesterol, HDL, LDL, Triglycerides)  Performed once a year for most people. If at low risk, may be assessed every 2 years.     The goal for LDL is less than 100 mg/dl. If at high risk, the goal is less than 70 mg/dl.     The goal for HDL is higher than 40 mg/dl for men and higher than 50 mg/dl for women.     The goal for triglycerides is less than 150 mg/dl.  Flu vaccine, pneumonia vaccine, and hepatitis B vaccine  The flu vaccine is recommended yearly.     The pneumonia vaccine is generally given once in a lifetime. However, there are some instances where another vaccine is recommended. Check with your caregiver.     The hepatitis B vaccine is also recommended for adults with diabetes.  Diabetes self-management education  Recommended at diagnosis and ongoing as needed.  Treatment plan  Reviewed at every medical visit.    Document Released: 12/06/2008 Document Revised: 10/21/2010 Document Reviewed: 08/11/2010 [facility] Patient Information 2012 [place], [place].Diabetes Meal Planning Guide The diabetes meal planning guide is a tool to help you plan your meals and snacks. It is important for people with diabetes to manage their blood glucose (sugar) levels. Choosing the right foods and the right amounts throughout your day will help control your blood glucose. Eating right can even help you improve your blood pressure and reach or maintain a healthy weight. CARBOHYDRATE COUNTING MADE EASY When you eat carbohydrates, they turn to  sugar. This raises your blood glucose level. Counting carbohydrates can help you control this level so you feel better. When you plan your meals by counting carbohydrates, you can have more flexibility in what you eat and balance your medicine with your food intake. Carbohydrate counting simply means adding up the total amount of carbohydrate grams in your meals and snacks. Try to eat about the same amount at each meal. Foods with carbohydrates are listed below. Each portion below is 1 carbohydrate serving or 15 grams of carbohydrates. Ask your dietician how many grams of carbohydrates you should eat at each meal or snack. Grains and Starches  1 slice bread.      English muffin or hotdog/hamburger bun.      cup cold cereal (unsweetened).     ? cup cooked pasta or rice.      cup starchy vegetables (corn, potatoes, peas, beans, winter squash).     1 tortilla (6 inches).      bagel.     1 waffle or pancake (size of a CD).      cup cooked cereal.     4 to 6 small crackers.  *Whole grain is recommended. Fruit  1 cup fresh unsweetened berries, melon, papaya, pineapple.     1 small fresh fruit.      banana or mango.      cup fruit juice (4 oz unsweetened).      cup canned fruit in natural juice or water.     2 tbs dried fruit.     12 to 15 grapes or cherries.  Milk  and Yogurt  1 cup fat-free or 1% milk.     1 cup soy milk.     6 oz light yogurt with sugar-free sweetener.     6 oz low-fat soy yogurt.     6 oz plain yogurt.  Vegetables  1 cup raw or  cup cooked is counted as 0 carbohydrates or a "free" food.     If you eat 3 or more servings at 1 meal, count them as 1 carbohydrate serving.  Other Carbohydrates   oz chips or pretzels.      cup ice cream or frozen yogurt.      cup sherbet or sorbet.     2 inch square cake, no frosting.     1 tbs honey, sugar, jam, jelly, or syrup.     2 small cookies.     3 squares of graham crackers.     3 cups popcorn.     6 crackers.     1 cup broth-based soup.     Count 1 cup casserole or other mixed foods as 2 carbohydrate servings.     Foods with less than 20 calories in a serving may be counted as 0 carbohydrates or a "free" food.  You may want to purchase a book or computer software that lists the carbohydrate gram counts of different foods. In addition, the nutrition facts panel on the labels of the foods you eat are a good source of this information. The label will tell you how big the serving size is and the total number of carbohydrate grams you will be eating per serving. Divide this number by 15 to obtain the number of carbohydrate servings in a portion. Remember, 1 carbohydrate serving equals 15 grams of carbohydrate. SERVING SIZES Measuring foods and serving sizes helps you make sure you are getting the right amount of food. The list below tells how big or  small some common serving sizes are.  1 oz.........4 stacked dice.     3 oz........Mar[place] KitchenDeck of cards.     1 tsp.......Marland KitchenTip of little finger.     1 tbs......Marland KitchenMarland KitchenThumb.     2 tbs.......Marland KitchenGolf ball.      cup......Marland KitchenHalf of a fist.     1 cup.......Marland KitchenA fist.  SAMPLE DIABETES MEAL PLAN Below is a sample meal plan that includes foods from the grain and starches, dairy, vegetable, fruit, and meat groups. A dietician can  individualize a meal plan to fit your calorie needs and tell you the number of servings needed from each food group. However, controlling the total amount of carbohydrates in your meal or snack is more important than making sure you include all of the food groups at every meal. You may interchange carbohydrate containing foods (dairy, starches, and fruits). The meal plan below is an example of a 2000 calorie diet using carbohydrate counting. This meal plan has 17 carbohydrate servings. Breakfast  1 cup oatmeal (2 carb servings).      cup light yogurt (1 carb serving).     1 cup blueberries (1 carb serving).      cup almonds.  Snack  1 large apple (2 carb servings).     1 low-fat string cheese stick.  Lunch  Chicken breast salad.     1 cup spinach.      cup chopped tomatoes.     2 oz chicken breast, sliced.     2 tbs low-fat [place] dressing.     12 whole-wheat crackers (2 carb servings).     12 to 15 grapes (1 carb serving).     1 cup low-fat milk (1 carb serving).  Snack  1 cup carrots.      cup hummus (1 carb serving).  Dinner  3 oz broiled salmon.     1 cup brown rice (3 carb servings).  Snack  1  cups steamed broccoli (1 carb serving) drizzled with 1 tsp olive oil and lemon juice.     1 cup light pudding (2 carb servings).  DIABETES MEAL PLANNING WORKSHEET Your dietician can use this worksheet to help you decide how many servings of foods and what types of foods are right for you.   BREAKFAST Food Group and Servings / Carb Servings Grain/Starches __________________________________ Dairy __________________________________________ Vegetable ______________________________________ Fruit ___________________________________________ Meat __________________________________________ Fat ____________________________________________ LUNCH Food Group and Servings / Carb Servings Grain/Starches ___________________________________ Dairy  ___________________________________________ Fruit ____________________________________________ Meat ___________________________________________ Fat _____________________________________________ Laural Golden Food Group and Servings / Carb Servings Grain/Starches ___________________________________ Dairy ___________________________________________ Fruit ____________________________________________ Meat ___________________________________________ Fat _____________________________________________ SNACKS Food Group and Servings / Carb Servings Grain/Starches ___________________________________ Dairy ___________________________________________ Vegetable _______________________________________ Fruit ____________________________________________ Meat ___________________________________________ Fat _____________________________________________ DAILY TOTALS Starches _________________________ Vegetable ________________________ Fruit ____________________________ Dairy ____________________________ Meat ____________________________ Fat ______________________________ Document Released: 11/05/2004 Document Revised: 10/21/2010 Document Reviewed: 09/16/2008 [employer] Patient Information 2012 [place], [place].Diabetes, Eating Away From Home Sometimes, you might eat in a restaurant or have meals that are prepared by someone else. You can enjoy eating out. However, the portions in restaurants may be much larger than needed. Listed below are some ideas to help you choose foods that will keep your blood glucose (sugar) in better control.   TIPS FOR EATING OUT  Know your meal plan and how many carbohydrate servings you should have at each meal. You may wish to carry a copy of your meal plan in your purse or wallet. Learn the foods included in each food group.  Make a list of restaurants near you that offer healthy choices. Take a copy of the carry-out menus to see what they offer. Then, you can plan what you will  order ahead of time.     Become familiar with serving sizes by practicing them at home using measuring cups and spoons. Once you learn to recognize portion sizes, you will be able to correctly estimate the amount of total carbohydrate you are allowed to eat at the restaurant. Ask for a takeout box if the portion is more than you should have. When your food comes, leave the amount you should have on the plate, and put the rest in the takeout box before you start eating.     Plan ahead if your mealtime will be different from usual. Check with your caregiver to find out how to time meals and medicine if you are taking insulin.     Avoid high-fat foods, such as fried foods, cream sauces, high-fat salad dressings, or any added butter or margarine.     Do not be afraid to ask questions. Ask your server about the portion size, cooking methods, ingredients and if items can be substituted. Restaurants do not list all available items on the menu. You can ask for your main entree to be prepared using skim milk, oil instead of butter or margarine, and without gravy or sauces. Ask your waiter or waitress to serve salad dressings, gravy, sauces, margarine, and sour cream on the side. You can then add the amount your meal plan suggests.     Add more vegetables whenever possible.     Avoid items that are labeled "jumbo," "giant," "deluxe," or "supersized."     You may want to split an entre with someone and order an extra side salad.     Watch for hidden calories in foods like croutons, bacon, or cheese.     Ask your server to take away the bread basket or chips from your table.     Order a dinner salad as an appetizer.  You can eat most foods served in a restaurant. Some foods are better choices than others. Breads and Starches  Recommended: All kinds of bread (wheat, rye, white, oatmeal, [place], [place], raisin), hard or soft dinner rolls, frankfurter or hamburger buns, small bagels, small corn or  whole-wheat flour tortillas.     Avoid: Frosted or glazed breads, butter rolls, egg or cheese breads, croissants, sweet rolls, pastries, coffee cake, glazed or frosted doughnuts, muffins.  Crackers  Recommended: Animal crackers, graham, rye, saltine, oyster, and matzoth crackers. Bread sticks, melba toast, rusks, pretzels, popcorn (without fat), zwieback toast.     Avoid: High-fat snack crackers or chips. Buttered popcorn.  Cereals  Recommended: Hot and cold cereals. Whole grains such as oatmeal or shredded wheat are good choices.     Avoid: Sugar-coated or granola type cereals.  Potatoes/Pasta/Rice/Beans  Recommended: Order baked, boiled, or mashed potatoes, rice or noodles without added fat, whole beans. Order gravies, butter, margarine, or sauces on the side so you can control the amount you add.     Avoid: Hash browns or fried potatoes. Potatoes, pasta, or rice prepared with cream or cheese sauce. Potato or pasta salads prepared with large amounts of dressing. Fried beans or fried rice.  Vegetables  Recommended: Order steamed, baked, boiled, or stewed vegetables without sauces or extra fat. Ask that sauce be served on the side. If vegetables are not listed on the menu, ask what is available.  Avoid: Vegetables prepared with cream, butter, or cheese sauce. Fried vegetables.  Salad Bars  Recommended: Many of the vegetables at a salad bar are considered "free." Use lemon juice, vinegar, or low-calorie salad dressing (fewer than 20 calories per serving) as "free" dressings for your salad. Look for salad bar ingredients that have no added fat or sugar such as tomatoes, lettuce, cucumbers, broccoli, carrots, onions, and mushrooms.     Avoid: Prepared salads with large amounts of dressing, such as coleslaw, caesar salad, macaroni salad, bean salad, or carrot salad.  Fruit  Recommended: Eat fresh fruit or fresh fruit salad without added dressing. A salad bar often offers fresh fruit  choices, but canned fruit at a restaurant is usually packed in sugar or syrup.     Avoid: Sweetened canned or frozen fruits, plain or sweetened fruit juice. Fruit salads with dressing, sour cream, or sugar added to them.  Meat and Meat Substitutes  Recommended: Order broiled, baked, roasted, or grilled meat, poultry, or fish. Trim off all visible fat. Do not eat the skin of poultry. The size stated on the menu is the raw weight. Meat shrinks by  in cooking (for example, 4 oz raw equals 3 oz cooked meat).     Avoid: Deep-fat fried meat, poultry, or fish. Breaded meats.  Eggs  Recommended: Order soft, hard-cooked, poached, or scrambled eggs. Omelets may be okay, depending on what ingredients are added. Egg substitutes are also a good choice.     Avoid: Fried eggs, eggs prepared with cream or cheese sauce.  Milk  Recommended: Order low-fat or fat-free milk according to your meal plan. Plain, nonfat yogurt or flavored yogurt with no sugar added may be used as a substitute for milk. Soy milk may also be used.     Avoid: Milk shakes or sweetened milk beverages.  Soups and Combination Foods  Recommended: Clear broth or consomm are "free" foods and may be used as an appetizer. Broth-based soups with fat removed count as a starch serving and are preferred over cream soups. Soups made with beans or split peas may be eaten but count as a starch.     Avoid: Fatty soups, soup made with cream, cheese soup. Combination foods prepared with excessive amounts of fat or with cream or cheese sauces.  Desserts and Sweets  Recommended: Ask for fresh fruit. Sponge or angel food cake without icing, ice milk, no sugar added ice cream, sherbet, or frozen yogurt may fit into your meal plan occasionally.     Avoid: Pastries, puddings, pies, cakes with icing, custard, gelatin desserts.  Fats and Oils  Recommended: Choose healthy fats such as olive oil, canola oil, or tub margarine, reduced fat or fat-free sour  cream, cream cheese, avocado, or nuts.     Avoid: Any fats in excess of your allowed portion. Deep-fried foods or any food with a large amount of fat.  Note: Ask for all fats to be served on the side, and limit your portion sizes according to your meal plan. Document Released: 02/08/2005 Document Revised: 10/21/2010 Document Reviewed: 08/29/2008 [facility] Patient Information 2012 [place], [place].Diabetes, Frequently Asked Questions WHAT IS DIABETES? Most of the food we eat is turned into glucose (sugar). Our bodies use it for energy. The pancreas makes a hormone called insulin. It helps glucose get into the cells of our bodies. When you have diabetes, your body either does not make enough insulin or cannot use its own insulin as well as it should. This causes sugars to  build up in your blood. WHAT ARE THE SYMPTOMS OF DIABETES?  Frequent urination.     Excessive thirst.     Unexplained weight loss.     Extreme hunger.     Blurred vision.     Tingling or numbness in hands or feet.     Feeling very tired much of the time.     Dry, itchy skin.     Sores that are slow to heal.     Yeast infections.  WHAT ARE THE TYPES OF DIABETES? Type 1 Diabetes   About 10% of affected people have this type.     Usually occurs before the age of 34.     Usually occurs in thin to normal weight people.  Type 2 Diabetes  About 90% of affected people have this type.     Usually occurs after the age of 75.     Usually occurs in overweight people.     More likely to have:     A family history of diabetes.     A history of diabetes during pregnancy (gestational diabetes).     High blood pressure.     High cholesterol and triglycerides.  Gestational Diabetes  Occurs in about 4% of pregnancies.     Usually goes away after the baby is born.     More likely to occur in women with:     Family history of diabetes.     Previous gestational diabetes.     Obese.    Over 23 years old.    WHAT IS PRE-DIABETES? Pre-diabetes means your blood glucose is higher than normal, but lower than the diabetes range. It also means you are at risk of getting type 2 diabetes and heart disease. If you are told you have pre-diabetes, have your blood glucose checked again in 1 to 2 years. WHAT IS THE TREATMENT FOR DIABETES? Treatment is aimed at keeping blood glucose near normal levels at all times. Learning how to manage this yourself is important in treating diabetes. Depending on the type of diabetes you have, your treatment will include one or more of the following:  Monitoring your blood glucose.     Meal planning.     Exercise.    Oral medicine (pills) or insulin.  CAN DIABETES BE PREVENTED? With type 1 diabetes, prevention is more difficult, because the triggers that cause it are not yet known. With type 2 diabetes, prevention is more likely, with lifestyle changes:  Maintain a healthy weight.     Eat healthy.     Exercise.  IS THERE A CURE FOR DIABETES? No, there is no cure for diabetes. There is a lot of research going on that is looking for a cure, and progress is being made. Diabetes can be treated and controlled. People with diabetes can manage their diabetes and lead normal, active lives. SHOULD I BE TESTED FOR DIABETES? If you are at least 62 years old, you should be tested for diabetes. You should be tested again every 3 years. If you are 45 or older and overweight, you may want to get tested more often. If you are younger than 45, overweight, and have one or more of the following risk factors, you should be tested:  Family history of diabetes.     Inactive lifestyle.     High blood pressure.  WHAT ARE SOME OTHER SOURCES FOR INFORMATION ON DIABETES? The following organizations may help in your search for more information on diabetes: National Diabetes Education  Program (NDEP) Internet: SolarDiscussions.es American Diabetes Association Internet:  http://www.diabetes.org   Juvenile Diabetes Foundation International Internet: [employer]Wash.[employer] Document Released: 02/11/2003 Document Revised: 10/21/2010 Document Reviewed: 12/06/2008 [facility] Patient Information 2012 [place], [place].Diabetes, Type 2 Diabetes is a long-lasting (chronic) disease. In type 2 diabetes, the pancreas does not make enough insulin (a hormone), and the body does not respond normally to the insulin that is made. This type of diabetes was also previously called adult-onset diabetes. It usually occurs after the age of 40, but it can occur at any age.   CAUSES   Type 2 diabetes happens because the pancreas is not making enough insulin or your body has trouble using the insulin that your pancreas does make properly. SYMPTOMS    Drinking more than usual.     Urinating more than usual.     Blurred vision.     Dry, itchy skin.     Frequent infections.     Feeling more tired than usual (fatigue).  DIAGNOSIS The diagnosis of type 2 diabetes is usually made by one of the following tests:  Fasting blood glucose test. You will not eat for at least 8 hours and then take a blood test.     Random blood glucose test. Your blood glucose (sugar) is checked at any time of the day regardless of when you ate.     Oral glucose tolerance test (OGTT). Your blood glucose is measured after you have not eaten (fasted) and then after you drink a glucose containing beverage.  TREATMENT    Healthy eating.     Exercise.    Medicine, if needed.     Monitoring blood glucose.     Seeing your caregiver regularly.  HOME CARE INSTRUCTIONS    Check your blood glucose at least once a day. More frequent monitoring may be necessary, depending on your medicines and on how well your diabetes is controlled. Your caregiver will advise you.     Take your medicine as directed by your caregiver.     Do not smoke.     Make wise food choices. Ask your caregiver for information. Weight loss  can improve your diabetes.     Learn about low blood glucose (hypoglycemia) and how to treat it.     Get your eyes checked regularly.     Have a yearly physical exam. Have your blood pressure checked and your blood and urine tested.     Wear a pendant or bracelet saying that you have diabetes.     Check your feet every night for cuts, sores, blisters, and redness. Let your caregiver know if you have any problems.  SEEK MEDICAL CARE IF:    You have problems keeping your blood glucose in target range.     You have problems with your medicines.     You have symptoms of an illness that do not improve after 24 hours.     You have a sore or wound that is not healing.     You notice a change in vision or a new problem with your vision.     You have a fever.  MAKE SURE YOU:  Understand these instructions.     Will watch your condition.     Will get help right away if you are not doing well or get worse.  Document Released: 02/08/2005 Document Revised: 10/22/2010 Document Reviewed: 07/27/2010 [facility] Patient Information 2012 [place], [place].Monitoring for Diabetes There are two blood tests that help you monitor and manage your  diabetes. These include:  An A1c (hemoglobin A1c) test.   This test is an average of your glucose (or blood sugar) control over the past 3 months.     This is recommended as a way for you and your caregiver to understand how well your glucose levels are controlled on the average.     Your A1c goal will be determined by your caregiver, but it is usually best if it is less than 6.5% to 7.0%.     Glucose (sugar) attaches itself to red blood cells. The amount of glucose then can then be measured. The amount of glucose on the cells depends on how high your blood glucose has been.     SMBG test (self-monitoring blood glucose).   Using a blood glucose monitor (meter) to do SMBG testing is an easy way to monitor the amount of glucose in your blood and can help  you improve your control. The monitor will tell you what your blood glucose is at that very moment. Every person with diabetes should have a blood glucose monitor and know how to use it. The better you control your blood sugar on a daily basis, the better your A1c levels will be.  HOW OFTEN SHOULD I HAVE AN A1C LEVEL?  Every 3 months if your diabetes is not well controlled or if therapy has changed.     Every 6 months if you are meeting your treatment goals.  HOW OFTEN SHOULD I DO SMBG TESTING?   Your caregiver will recommend how often you should test. Testing times are based on the kind of medicine you take, type of diabetes you have, and your blood glucose control. Testing times can include:  Type 1 diabetes: test 3 or 4 times a day or as directed.     Type 2 diabetes and if you are taking insulin and diabetes pills: test 3 or 4 times a day or as directed.     If you are taking diabetes pills only and not reaching your target A1c: test 2 to 4 times a day or as directed.     If you are taking diabetes pills and are controlling your diabetes well with diet and exercise, your caregiver will help you decide what is appropriate.  WHAT TIME OF DAY SHOULD I TEST?   The best time of day to test your blood glucose depends on medications, mealtimes, exercise, and blood glucose control. It is best to test at different times because this will help you know how you are doing throughout the day. Your caregiver will help you decide what is best. WHAT SHOULD MY BLOOD GLUCOSE BE? Blood glucose target goals may vary depending on each persons needs, whether they have type 1 or type 2 diabetes or what medications they are taking. However, as a general rule, blood glucose should be:  Before meals...70-130 mg/dl.     After meals .Marland Kitchen...less than 180 mg/dl.  CHECK YOUR BLOOD GLUCOSE IF:  You have symptoms of low blood sugar (hypoglycemia), which may include dizziness, shaking, sweating, chills and confusion.       You have symptoms of high blood sugar (hyperglycemia), which may include sleepiness, blurred vision, frequent urination and excessive thirst.     You are learning how meals, physical activity and medicine affect your blood glucose level. The more you learn about how various foods, your medications, and activities affect you, the better job you will do of taking care of yourself.     You have a  job in which poor control could cause safety problems while driving or operating machinery.  CHECK YOUR BLOOD SUGAR MORE FREQUENTLY:  If you have medication or dietary changes.     If you begin taking other kinds of medicines.     If you become sick or your level of stress increases. With an illness, your blood sugar may even be high without eating.     Before and after exercise.  Follow your caregiver's testing recommendations during this time.   TO DISPOSE OF SHARPS: Each city or state may have different regulations. Check with your public works or Engineer, structural.  Sharps containers can be purchased from [employer] all used sharps in a container. You do not need to replace any protective covers over the needle or break the needle.     Sharps should be contained in a ridge, leakproof, puncture-resistant container.     Plastic detergent bottle.     Bleach bottle.     When container is almost full, add a solution that is 1 part laundry bleach and 9 parts tap water (it is ok to use undiluted bleach if you wish). You may want to wear gloves since bleach can damage tissue. Let the solution sit for 30 minutes.     Carefully pour all the liquid into the sanitary sewer. Be sure to prevent the sharps from falling out.     Once liquid is drained, reseal the container with lid and tape it shut with duct tape. This will prevent the cap from coming off.     Dispose of the container with your regular household trash and waste. It is a good idea to let your trash hauler know that  you will be disposing of sharps.  Document Released: 02/11/2003 Document Revised: 10/21/2010 Document Reviewed: 08/12/2008 [facility] Patient Information 2012 [place], [place].

## 2010-12-18 NOTE — Progress Notes (Signed)
  Subjective:    Patient ID: Darlene Mclaughlin, female    DOB: [birth year], 60 y.o.   MRN: [id number]  HPI  Wt Readings from Last 3 Encounters:  12/18/10 274 lb (124.286 kg)  06/18/10 273 lb (123.832 kg)  12/11/09 272 lb (123.50 kg)   60 year old patient who has a history of impaired glucose tolerance. She was seen here 6 months ago for followup of her hypertension and hemoglobin A1c 8.9. She was placed on oral medications at that time but has not been seen in followup until today. She is doing quite well and a fasting blood sugar today 120. She has had no hypoglycemia. There's been no change in her weight although she states she is eating better and exercising more. No new concerns or complaints. A glucometer and diabetic instructions are given today. Review of Systems  Constitutional: Negative.   HENT: Negative for hearing loss, congestion, sore throat, rhinorrhea, dental problem, sinus pressure and tinnitus.   Eyes: Negative for pain, discharge and visual disturbance.  Respiratory: Negative for cough and shortness of breath.   Cardiovascular: Negative for chest pain, palpitations and leg swelling.  Gastrointestinal: Negative for nausea, vomiting, abdominal pain, diarrhea, constipation, blood in stool and abdominal distention.  Genitourinary: Negative for dysuria, urgency, frequency, hematuria, flank pain, vaginal bleeding, vaginal discharge, difficulty urinating, vaginal pain and pelvic pain.  Musculoskeletal: Negative for joint swelling, arthralgias and gait problem.  Skin: Negative for rash.  Neurological: Negative for dizziness, syncope, speech difficulty, weakness, numbness and headaches.  Hematological: Negative for adenopathy.  Psychiatric/Behavioral: Negative for behavioral problems, dysphoric mood and agitation. The patient is not nervous/anxious.        Objective:   Physical Exam  Constitutional: She is oriented to person, place, and time. She appears well-developed and  well-nourished.       Weight 274 Repeat blood pressure 118/72  HENT:  Head: Normocephalic.  Right Ear: External ear normal.  Left Ear: External ear normal.  Mouth/Throat: Oropharynx is clear and moist.  Eyes: Conjunctivae and EOM are normal. Pupils are equal, round, and reactive to light.  Neck: Normal range of motion. Neck supple. No thyromegaly present.  Cardiovascular: Normal rate, regular rhythm, normal heart sounds and intact distal pulses.   Pulmonary/Chest: Effort normal and breath sounds normal.  Abdominal: Soft. Bowel sounds are normal. She exhibits no mass. There is no tenderness.  Musculoskeletal: Normal range of motion.  Lymphadenopathy:    She has no cervical adenopathy.  Neurological: She is alert and oriented to person, place, and time.  Skin: Skin is warm and dry. No rash noted.  Psychiatric: She has a normal mood and affect. Her behavior is normal.          Assessment & Plan:   Diabetes mellitus. We'll check a hemoglobin A1c. A glucometer and considerable diabetic information dispensed. Weight loss exercise all encouraged Hypertension well controlled We'll recheck 3 months

## 2011-01-12 ENCOUNTER — Other Ambulatory Visit: Payer: Self-pay | Admitting: Internal Medicine

## 2011-02-10 ENCOUNTER — Other Ambulatory Visit: Payer: Self-pay | Admitting: Internal Medicine

## 2011-02-10 DIAGNOSIS — Z1231 Encounter for screening mammogram for malignant neoplasm of breast: Secondary | ICD-10-CM

## 2011-02-11 ENCOUNTER — Ambulatory Visit (HOSPITAL_COMMUNITY)
Admission: RE | Admit: 2011-02-11 | Discharge: 2011-02-11 | Disposition: A | Discharge status: Home / Self Care | Payer: BC Managed Care – PPO | Source: Ambulatory Visit | Attending: Internal Medicine | Admitting: Internal Medicine

## 2011-02-11 DIAGNOSIS — Z1231 Encounter for screening mammogram for malignant neoplasm of breast: Secondary | ICD-10-CM | POA: Insufficient documentation

## 2011-02-17 ENCOUNTER — Other Ambulatory Visit: Payer: Self-pay | Admitting: Internal Medicine

## 2011-03-22 ENCOUNTER — Encounter: Payer: Self-pay | Admitting: Internal Medicine

## 2011-03-22 ENCOUNTER — Ambulatory Visit (INDEPENDENT_AMBULATORY_CARE_PROVIDER_SITE_OTHER): Payer: BC Managed Care – PPO | Admitting: Internal Medicine

## 2011-03-22 DIAGNOSIS — E119 Type 2 diabetes mellitus without complications: Secondary | ICD-10-CM

## 2011-03-22 DIAGNOSIS — J45909 Unspecified asthma, uncomplicated: Secondary | ICD-10-CM

## 2011-03-22 DIAGNOSIS — I10 Essential (primary) hypertension: Secondary | ICD-10-CM

## 2011-03-22 LAB — HEMOGLOBIN A1C: Hgb A1c MFr Bld: 6.1 % (ref 4.6–6.5)

## 2011-03-22 MED ORDER — CYCLOBENZAPRINE HCL 5 MG PO TABS: 10.0000 mg | ORAL_TABLET | Freq: Three times a day (TID) | ORAL | Status: DC | PRN

## 2011-03-22 NOTE — Patient Instructions (Signed)
Limit your sodium (Salt) intake   Please check your hemoglobin A1c every 3 months  You need to lose weight.  Consider a lower calorie diet and regular exercise.    It is important that you exercise regularly, at least 20 minutes 3 to 4 times per week.  If you develop chest pain or shortness of breath seek  medical attention. 

## 2011-03-22 NOTE — Progress Notes (Signed)
  Subjective:    Patient ID: Darlene Mclaughlin, female    DOB: [birth year], 61 y.o.   MRN: [id number]  HPI  61 year old patient who has a history of type 2 diabetes. This is fairly new onset last year and she has been controlled on dual therapy. Fasting blood sugar today 105. There has been no hemoglobin A1c since initiating therapy. She feels well today. No concerns or complaints. She has treated hypertension and also has done well. She remains on diuretic therapy with potassium supplementation. She has exogenous obesity. She has a history of asthma which has been stable  Wt Readings from Last 3 Encounters:  03/22/11 276 lb (125.193 kg)  12/18/10 274 lb (124.286 kg)  06/18/10 273 lb (123.832 kg)       Review of Systems  Constitutional: Negative.   HENT: Negative for hearing loss, congestion, sore throat, rhinorrhea, dental problem, sinus pressure and tinnitus.   Eyes: Negative for pain, discharge and visual disturbance.  Respiratory: Negative for cough and shortness of breath.   Cardiovascular: Negative for chest pain, palpitations and leg swelling.  Gastrointestinal: Negative for nausea, vomiting, abdominal pain, diarrhea, constipation, blood in stool and abdominal distention.  Genitourinary: Negative for dysuria, urgency, frequency, hematuria, flank pain, vaginal bleeding, vaginal discharge, difficulty urinating, vaginal pain and pelvic pain.  Musculoskeletal: Negative for joint swelling, arthralgias and gait problem.  Skin: Negative for rash.  Neurological: Negative for dizziness, syncope, speech difficulty, weakness, numbness and headaches.  Hematological: Negative for adenopathy.  Psychiatric/Behavioral: Negative for behavioral problems, dysphoric mood and agitation. The patient is not nervous/anxious.        Objective:   Physical Exam  Constitutional: She is oriented to person, place, and time. She appears well-developed and well-nourished.       Obese. Weight 276 Blood pressure  well controlled  HENT:  Head: Normocephalic.  Right Ear: External ear normal.  Left Ear: External ear normal.  Mouth/Throat: Oropharynx is clear and moist.  Eyes: Conjunctivae and EOM are normal. Pupils are equal, round, and reactive to light.  Neck: Normal range of motion. Neck supple. No thyromegaly present.  Cardiovascular: Normal rate, regular rhythm, normal heart sounds and intact distal pulses.   Pulmonary/Chest: Effort normal and breath sounds normal.  Abdominal: Soft. Bowel sounds are normal. She exhibits no mass. There is no tenderness.  Musculoskeletal: Normal range of motion.  Lymphadenopathy:    She has no cervical adenopathy.  Neurological: She is alert and oriented to person, place, and time.  Skin: Skin is warm and dry. No rash noted.  Psychiatric: She has a normal mood and affect. Her behavior is normal.          Assessment & Plan:     Diabetes mellitus appears to be well controlled. We'll check a halo A1c Hypertension stable.  Exogenous obesity. Weight loss encouraged  Recheck 3 months

## 2011-03-30 ENCOUNTER — Telehealth: Payer: Self-pay | Admitting: *Deleted

## 2011-03-30 NOTE — Telephone Encounter (Signed)
hghA1C  6.1-  excellent diabetic control.  Please call/notify patient that lab/test/procedure is normal

## 2011-03-30 NOTE — Telephone Encounter (Signed)
Pt aware.

## 2011-03-30 NOTE — Telephone Encounter (Signed)
Pt would like to have lab results.

## 2011-06-08 ENCOUNTER — Ambulatory Visit (INDEPENDENT_AMBULATORY_CARE_PROVIDER_SITE_OTHER): Payer: BC Managed Care – PPO | Admitting: Internal Medicine

## 2011-06-08 ENCOUNTER — Encounter: Payer: Self-pay | Admitting: Internal Medicine

## 2011-06-08 VITALS — BP 112/70 | Temp 98.7°F | Wt 270.0 lb

## 2011-06-08 DIAGNOSIS — M1990 Unspecified osteoarthritis, unspecified site: Secondary | ICD-10-CM

## 2011-06-08 DIAGNOSIS — E119 Type 2 diabetes mellitus without complications: Secondary | ICD-10-CM

## 2011-06-08 DIAGNOSIS — M79609 Pain in unspecified limb: Secondary | ICD-10-CM

## 2011-06-08 DIAGNOSIS — M79646 Pain in unspecified finger(s): Secondary | ICD-10-CM

## 2011-06-08 DIAGNOSIS — I10 Essential (primary) hypertension: Secondary | ICD-10-CM

## 2011-06-08 MED ORDER — NAPROXEN-ESOMEPRAZOLE 500-20 MG PO TBEC: 1.0000 | DELAYED_RELEASE_TABLET | Freq: Two times a day (BID) | ORAL | Status: DC

## 2011-06-08 NOTE — Patient Instructions (Signed)
Return as scheduled for followup  Call or return to clinic prn if these symptoms worsen or fail to improve as anticipated.

## 2011-06-08 NOTE — Progress Notes (Signed)
  Subjective:    Patient ID: Darlene Mclaughlin, female    DOB: Oct 24, 1950, 61 y.o.   MRN: [id number]  HPI  61 year old patient who has a history of treated hypertension and type 2 diabetes. For the past 6 weeks she's had intermittent left thumb pain. She is right-handed. At times she has felt a bit chilled but no history of fever symptoms are intermittent. She has been using acetaminophen only. She does have a history of osteoarthritis. No history of trauma    Review of Systems  Constitutional: Negative.   HENT: Negative for hearing loss, congestion, sore throat, rhinorrhea, dental problem, sinus pressure and tinnitus.   Eyes: Negative for pain, discharge and visual disturbance.  Respiratory: Negative for cough and shortness of breath.   Cardiovascular: Negative for chest pain, palpitations and leg swelling.  Gastrointestinal: Negative for nausea, vomiting, abdominal pain, diarrhea, constipation, blood in stool and abdominal distention.  Genitourinary: Negative for dysuria, urgency, frequency, hematuria, flank pain, vaginal bleeding, vaginal discharge, difficulty urinating, vaginal pain and pelvic pain.  Musculoskeletal: Positive for arthralgias. Negative for joint swelling and gait problem.  Skin: Negative for rash.  Neurological: Negative for dizziness, syncope, speech difficulty, weakness, numbness and headaches.  Hematological: Negative for adenopathy.  Psychiatric/Behavioral: Negative for behavioral problems, dysphoric mood and agitation. The patient is not nervous/anxious.        Objective:   Physical Exam  Constitutional: She appears well-developed and well-nourished. No distress.       Blood pressure 112/70 Weight 270 Fasting blood sugar 103  Musculoskeletal:       There is mild soft tissue swelling involving the left thumb. There is very mild tenderness along the MCP joint;  the thenar eminence also is slightly tender but no obvious soft tissue swelling. Flexion and extension of  the thumb mildly impaired due to the soft tissue swelling          Assessment & Plan:   Osteoarthritis left thumb pain. We'll place on anti-inflammatory medication. Samples of Vimovo dispensed. She will take this medication twice daily. She is scheduled for followup in 2 weeks will call if there are any worsening symptoms. Otherwise we'll reassess at that time. If she worsens we'll consider x-ray and orthopedic referral

## 2011-06-09 ENCOUNTER — Telehealth: Payer: Self-pay | Admitting: Internal Medicine

## 2011-06-09 MED ORDER — TRAMADOL HCL 50 MG PO TABS: 50.0000 mg | ORAL_TABLET | Freq: Three times a day (TID) | ORAL | Status: DC | PRN

## 2011-06-09 NOTE — Telephone Encounter (Signed)
Pt aware.

## 2011-06-09 NOTE — Telephone Encounter (Signed)
Pt called and said that the pain med she was given yesterday was Naproxen and pt is allergic to this med. Pt req different pain med for thumb. Pls let pt know if there are samples avail of diff pain med or pt uses [facility] on  [address]

## 2011-06-09 NOTE — Telephone Encounter (Signed)
Please advise change to med

## 2011-06-09 NOTE — Telephone Encounter (Signed)
Please call in a prescription for tramadol 50 mg #50 one every 6 hours as needed for pain

## 2011-06-21 ENCOUNTER — Encounter: Payer: Self-pay | Admitting: Internal Medicine

## 2011-06-21 ENCOUNTER — Ambulatory Visit (INDEPENDENT_AMBULATORY_CARE_PROVIDER_SITE_OTHER): Payer: BC Managed Care – PPO | Admitting: Internal Medicine

## 2011-06-21 DIAGNOSIS — E119 Type 2 diabetes mellitus without complications: Secondary | ICD-10-CM

## 2011-06-21 DIAGNOSIS — M1990 Unspecified osteoarthritis, unspecified site: Secondary | ICD-10-CM

## 2011-06-21 DIAGNOSIS — I10 Essential (primary) hypertension: Secondary | ICD-10-CM

## 2011-06-21 LAB — HEMOGLOBIN A1C: Hgb A1c MFr Bld: 6.1 % (ref 4.6–6.5)

## 2011-06-21 MED ORDER — CYCLOBENZAPRINE HCL 5 MG PO TABS: 10.0000 mg | ORAL_TABLET | Freq: Three times a day (TID) | ORAL | Status: DC | PRN

## 2011-06-21 MED ORDER — GLIMEPIRIDE 4 MG PO TABS: 2.0000 mg | ORAL_TABLET | ORAL | Status: DC

## 2011-06-21 MED ORDER — GLUCOSE BLOOD VI STRP: 1.0000 | ORAL_STRIP | Freq: Two times a day (BID) | Status: DC

## 2011-06-21 MED ORDER — POTASSIUM CHLORIDE CRYS ER 20 MEQ PO TBCR: 20.0000 meq | EXTENDED_RELEASE_TABLET | Freq: Every day | ORAL | Status: DC

## 2011-06-21 MED ORDER — METFORMIN HCL ER 500 MG PO TB24: 1000.0000 mg | ORAL_TABLET | Freq: Every day | ORAL | Status: DC

## 2011-06-21 NOTE — Progress Notes (Signed)
  Subjective:    Patient ID: Darlene Mclaughlin, female    DOB: March 07, 1950, 61 y.o.   MRN: [id number]  HPI 61 year old patient who has a history of hypertension and diabetes. She was syncopal with good pain and swelling involving her left thumb this has greatly improved she still has a trigger finger involving the thumb but the pain and soft tissue swelling about the MCP joint and thenar eminence has largely resolved. She has maintained her in ice twice in the control. Her last hemoglobin A1c 6.1. There has been no hypoglycemic symptoms     Review of Systems  Constitutional: Negative.   HENT: Negative for hearing loss, congestion, sore throat, rhinorrhea, dental problem, sinus pressure and tinnitus.   Eyes: Negative for pain, discharge and visual disturbance.  Respiratory: Negative for cough and shortness of breath.   Cardiovascular: Negative for chest pain, palpitations and leg swelling.  Gastrointestinal: Negative for nausea, vomiting, abdominal pain, diarrhea, constipation, blood in stool and abdominal distention.  Genitourinary: Negative for dysuria, urgency, frequency, hematuria, flank pain, vaginal bleeding, vaginal discharge, difficulty urinating, vaginal pain and pelvic pain.  Musculoskeletal: Negative for joint swelling, arthralgias (left thumb pain resolved) and gait problem.  Skin: Negative for rash.  Neurological: Negative for dizziness, syncope, speech difficulty, weakness, numbness and headaches.  Hematological: Negative for adenopathy.  Psychiatric/Behavioral: Negative for behavioral problems, dysphoric mood and agitation. The patient is not nervous/anxious.        Objective:   Physical Exam  Constitutional: She is oriented to person, place, and time. She appears well-developed and well-nourished.       Blood pressure low normal Weight 272  HENT:  Head: Normocephalic.  Right Ear: External ear normal.  Left Ear: External ear normal.  Mouth/Throat: Oropharynx is clear and  moist.  Eyes: Conjunctivae and EOM are normal. Pupils are equal, round, and reactive to light.  Neck: Normal range of motion. Neck supple. No thyromegaly present.  Cardiovascular: Normal rate, regular rhythm, normal heart sounds and intact distal pulses.   Pulmonary/Chest: Effort normal and breath sounds normal.  Abdominal: Soft. Bowel sounds are normal. She exhibits no mass. There is no tenderness.  Musculoskeletal: Normal range of motion.       Trigger finger left thumb no significant tenderness or soft tissue swelling about the left thumb or thenar eminence  Lymphadenopathy:    She has no cervical adenopathy.  Neurological: She is alert and oriented to person, place, and time.  Skin: Skin is warm and dry. No rash noted.  Psychiatric: She has a normal mood and affect. Her behavior is normal.          Assessment & Plan:  Diabetes. Appears to be under excellent control. We'll check a hemoglobin A1c Diabetes well controlled Exogenous obesity  Check a hemoglobin A1c Weight loss encouraged Recheck 3 months

## 2011-06-21 NOTE — Patient Instructions (Signed)
Please check your hemoglobin A1c every 3 months    It is important that you exercise regularly, at least 20 minutes 3 to 4 times per week.  If you develop chest pain or shortness of breath seek  medical attention.  You need to lose weight.  Consider a lower calorie diet and regular exercise. 

## 2011-06-25 ENCOUNTER — Telehealth: Payer: Self-pay | Admitting: Internal Medicine

## 2011-06-25 NOTE — Telephone Encounter (Signed)
Spoke with pt - informed lab normal 6.1

## 2011-06-25 NOTE — Telephone Encounter (Signed)
Patient called stating that she would like a call back with A1C results. Please assist.

## 2011-07-08 ENCOUNTER — Other Ambulatory Visit: Payer: Self-pay | Admitting: Internal Medicine

## 2011-08-06 ENCOUNTER — Other Ambulatory Visit: Payer: Self-pay | Admitting: Internal Medicine

## 2011-08-19 ENCOUNTER — Other Ambulatory Visit: Payer: Self-pay | Admitting: Internal Medicine

## 2011-09-20 ENCOUNTER — Telehealth: Payer: Self-pay | Admitting: Internal Medicine

## 2011-09-20 ENCOUNTER — Ambulatory Visit (INDEPENDENT_AMBULATORY_CARE_PROVIDER_SITE_OTHER): Payer: BC Managed Care – PPO | Admitting: Internal Medicine

## 2011-09-20 ENCOUNTER — Encounter: Payer: Self-pay | Admitting: Internal Medicine

## 2011-09-20 VITALS — BP 120/80 | Temp 98.2°F | Wt 265.0 lb

## 2011-09-20 DIAGNOSIS — M1990 Unspecified osteoarthritis, unspecified site: Secondary | ICD-10-CM

## 2011-09-20 DIAGNOSIS — E119 Type 2 diabetes mellitus without complications: Secondary | ICD-10-CM

## 2011-09-20 DIAGNOSIS — I10 Essential (primary) hypertension: Secondary | ICD-10-CM

## 2011-09-20 LAB — HEMOGLOBIN A1C: Hgb A1c MFr Bld: 5.9 % (ref 4.6–6.5)

## 2011-09-20 MED ORDER — TRAMADOL HCL 50 MG PO TABS: 50.0000 mg | ORAL_TABLET | Freq: Four times a day (QID) | ORAL | Status: DC | PRN

## 2011-09-20 MED ORDER — CYCLOBENZAPRINE HCL 5 MG PO TABS: ORAL_TABLET | ORAL | Status: DC

## 2011-09-20 NOTE — Patient Instructions (Signed)
Please check your hemoglobin A1c every 3 months    It is important that you exercise regularly, at least 20 minutes 3 to 4 times per week.  If you develop chest pain or shortness of breath seek  medical attention.  Limit your sodium (Salt) intake  You need to lose weight.  Consider a lower calorie diet and regular exercise.    

## 2011-09-20 NOTE — Telephone Encounter (Signed)
noted 

## 2011-09-20 NOTE — Progress Notes (Signed)
Subjective:    Patient ID: Darlene Mclaughlin, female    DOB: February 26, 1950, 61 y.o.   MRN: [id number]  HPI  60 year old patient who is seen today for followup. She has type 2 diabetes and hypertension. She has osteoarthritis. She has done quite well. Her last 2 hemoglobin A1c's have been 6.1. No new concerns or complaints.  Past Medical History  Diagnosis Date  . ALLERGIC RHINITIS 05/15/2008  . ASTHMA 05/15/2008  . Headache 05/15/2008  . HYPERTENSION 05/15/2008  . IMPAIRED GLUCOSE TOLERANCE 05/15/2008  . OSTEOARTHRITIS 05/15/2008  . Obesity     History   Social History  . Marital Status: Married    Spouse Name: N/A    Number of Children: N/A  . Years of Education: N/A   Occupational History  . Not on file.   Social History Main Topics  . Smoking status: Never Smoker   . Smokeless tobacco: Never Used  . Alcohol Use: Yes     RARELY  . Drug Use: No  . Sexually Active: Not on file   Other Topics Concern  . Not on file   Social History Narrative  . No narrative on file    Past Surgical History  Procedure Date  . Cesarean section   . Abdominal hysterectomy   . Knee surgery     arthroscopic left    Family History  Problem Relation Age of Onset  . Diabetes Mother   . Hypertension Mother   . Cancer Mother     Breast cancer  . Hyperlipidemia Father     Allergies  Allergen Reactions  . Naproxen     Current Outpatient Prescriptions on File Prior to Visit  Medication Sig Dispense Refill  . acetaminophen (TYLENOL) 650 MG CR tablet Take 650 mg by mouth every 8 (eight) hours as needed.        Marland Kitchen aspirin 81 MG tablet Take 81 mg by mouth daily.        . chlorthalidone (HYGROTON) 25 MG tablet TAKE 1 TABLET BY MOUTH EVERY DAY  90 tablet  3  . cyclobenzaprine (FLEXERIL) 5 MG tablet TAKE 2 TABLETS (10 MG TOTAL) BY MOUTH 3 (THREE) TIMES DAILY AS NEEDED FOR MUSCLE SPASMS.  30 tablet  1  . glimepiride (AMARYL) 4 MG tablet TAKE 1/2 TABLET BY MOUTH EVERY MORNING.  45 tablet  3  .  glucose blood (FREESTYLE LITE) test strip 1 each by Other route 2 (two) times daily. Use as instructed  100 each  12  . metFORMIN (GLUCOPHAGE XR) 500 MG 24 hr tablet Take 2 tablets (1,000 mg total) by mouth daily with supper.  180 tablet  3  . Multiple Vitamin (MULTIVITAMIN) tablet Take 1 tablet by mouth daily.        . NON FORMULARY WEEKLY ALLERGY INJECTIONS       . potassium chloride SA (K-DUR,KLOR-CON) 20 MEQ tablet Take 1 tablet (20 mEq total) by mouth daily.  90 tablet  6  . traMADol (ULTRAM) 50 MG tablet TAKE 1 TABLET (50 MG TOTAL) BY MOUTH EVERY 8 (EIGHT) HOURS AS NEEDED FOR PAIN.  50 tablet  1  . DISCONTD: glimepiride (AMARYL) 4 MG tablet Take 0.5 tablets (2 mg total) by mouth every morning.  45 tablet  3    BP 120/80  Temp 98.2 F (36.8 C) (Oral)  Wt 265 lb (120.203 kg)      Review of Systems  Constitutional: Negative.   HENT: Negative for hearing loss, congestion, sore throat, rhinorrhea, dental  problem, sinus pressure and tinnitus.   Eyes: Negative for pain, discharge and visual disturbance.  Respiratory: Negative for cough and shortness of breath.   Cardiovascular: Negative for chest pain, palpitations and leg swelling.  Gastrointestinal: Negative for nausea, vomiting, abdominal pain, diarrhea, constipation, blood in stool and abdominal distention.  Genitourinary: Negative for dysuria, urgency, frequency, hematuria, flank pain, vaginal bleeding, vaginal discharge, difficulty urinating, vaginal pain and pelvic pain.  Musculoskeletal: Negative for joint swelling, arthralgias and gait problem.  Skin: Negative for rash.  Neurological: Negative for dizziness, syncope, speech difficulty, weakness, numbness and headaches.  Hematological: Negative for adenopathy.  Psychiatric/Behavioral: Negative for behavioral problems, dysphoric mood and agitation. The patient is not nervous/anxious.        Objective:   Physical Exam  Constitutional: She is oriented to person, place, and  time. She appears well-developed and well-nourished.  HENT:  Head: Normocephalic.  Right Ear: External ear normal.  Left Ear: External ear normal.  Mouth/Throat: Oropharynx is clear and moist.  Eyes: Conjunctivae and EOM are normal. Pupils are equal, round, and reactive to light.  Neck: Normal range of motion. Neck supple. No thyromegaly present.  Cardiovascular: Normal rate, regular rhythm, normal heart sounds and intact distal pulses.   Pulmonary/Chest: Effort normal and breath sounds normal.  Abdominal: Soft. Bowel sounds are normal. She exhibits no mass. There is no tenderness.  Musculoskeletal: Normal range of motion.  Lymphadenopathy:    She has no cervical adenopathy.  Neurological: She is alert and oriented to person, place, and time.  Skin: Skin is warm and dry. No rash noted.  Psychiatric: She has a normal mood and affect. Her behavior is normal.          Assessment & Plan:    Diabetes Mellitus.   Check HghA1C Htn- stable DJD

## 2011-10-11 ENCOUNTER — Ambulatory Visit (INDEPENDENT_AMBULATORY_CARE_PROVIDER_SITE_OTHER): Payer: BC Managed Care – PPO | Admitting: Internal Medicine

## 2011-10-11 DIAGNOSIS — Z0000 Encounter for general adult medical examination without abnormal findings: Secondary | ICD-10-CM

## 2011-10-13 LAB — TB SKIN TEST: TB Skin Test: NEGATIVE

## 2011-10-25 ENCOUNTER — Other Ambulatory Visit: Payer: Self-pay | Admitting: Internal Medicine

## 2011-12-21 ENCOUNTER — Encounter: Payer: Self-pay | Admitting: Internal Medicine

## 2011-12-21 ENCOUNTER — Ambulatory Visit (INDEPENDENT_AMBULATORY_CARE_PROVIDER_SITE_OTHER): Payer: BC Managed Care – PPO | Admitting: Internal Medicine

## 2011-12-21 VITALS — BP 110/70 | Temp 98.5°F | Wt 263.0 lb

## 2011-12-21 DIAGNOSIS — M1990 Unspecified osteoarthritis, unspecified site: Secondary | ICD-10-CM

## 2011-12-21 DIAGNOSIS — I10 Essential (primary) hypertension: Secondary | ICD-10-CM

## 2011-12-21 DIAGNOSIS — E119 Type 2 diabetes mellitus without complications: Secondary | ICD-10-CM

## 2011-12-21 DIAGNOSIS — Z23 Encounter for immunization: Secondary | ICD-10-CM

## 2011-12-21 NOTE — Progress Notes (Signed)
Subjective:    Patient ID: Darlene Mclaughlin, female    DOB: 03-19-1950, 61 y.o.   MRN: [id number]  HPI  61 year old patient who is seen today for followup of type 2 diabetes and hypertension. She has obesity and chronic low back pain. Generally doing quite well except for the back pain. Her last hemoglobin A1c 5.9. Hemoglobin A1c have been normal since starting therapy approximately one year ago she does monitor home blood sugar readings with fairly normal results. No new concerns or complaints today  Past Medical History  Diagnosis Date  . ALLERGIC RHINITIS 05/15/2008  . ASTHMA 05/15/2008  . Headache 05/15/2008  . HYPERTENSION 05/15/2008  . IMPAIRED GLUCOSE TOLERANCE 05/15/2008  . OSTEOARTHRITIS 05/15/2008  . Obesity     History   Social History  . Marital Status: Married    Spouse Name: N/A    Number of Children: N/A  . Years of Education: N/A   Occupational History  . Not on file.   Social History Main Topics  . Smoking status: Never Smoker   . Smokeless tobacco: Never Used  . Alcohol Use: Yes     RARELY  . Drug Use: No  . Sexually Active: Not on file   Other Topics Concern  . Not on file   Social History Narrative  . No narrative on file    Past Surgical History  Procedure Date  . Cesarean section   . Abdominal hysterectomy   . Knee surgery     arthroscopic left    Family History  Problem Relation Age of Onset  . Diabetes Mother   . Hypertension Mother   . Cancer Mother     Breast cancer  . Hyperlipidemia Father     Allergies  Allergen Reactions  . Naproxen     Current Outpatient Prescriptions on File Prior to Visit  Medication Sig Dispense Refill  . acetaminophen (TYLENOL) 650 MG CR tablet Take 650 mg by mouth every 8 (eight) hours as needed.        Marland Kitchen aspirin 81 MG tablet Take 81 mg by mouth daily.        . chlorthalidone (HYGROTON) 25 MG tablet TAKE 1 TABLET BY MOUTH EVERY DAY  90 tablet  3  . cyclobenzaprine (FLEXERIL) 5 MG tablet 1 tablet every 8  hours as needed for muscle spasm  30 tablet  1  . glimepiride (AMARYL) 4 MG tablet TAKE 1/2 TABLET BY MOUTH EVERY MORNING.  45 tablet  3  . glucose blood (FREESTYLE LITE) test strip 1 each by Other route 2 (two) times daily. Use as instructed  100 each  12  . metFORMIN (GLUCOPHAGE XR) 500 MG 24 hr tablet Take 2 tablets (1,000 mg total) by mouth daily with supper.  180 tablet  3  . Multiple Vitamin (MULTIVITAMIN) tablet Take 1 tablet by mouth daily.        . NON FORMULARY WEEKLY ALLERGY INJECTIONS       . potassium chloride SA (K-DUR,KLOR-CON) 20 MEQ tablet Take 1 tablet (20 mEq total) by mouth daily.  90 tablet  6  . traMADol (ULTRAM) 50 MG tablet Take 1 tablet (50 mg total) by mouth every 6 (six) hours as needed for pain.  50 tablet  4  . traMADol (ULTRAM) 50 MG tablet TAKE 1 TABLET (50 MG TOTAL) BY MOUTH EVERY 8 (EIGHT) HOURS AS NEEDED FOR PAIN.  50 tablet  1    BP 110/70  Temp 98.5 F (36.9 C) (Oral)  Wt  263 lb (119.296 kg)      Review of Systems  Constitutional: Negative.   HENT: Negative for hearing loss, congestion, sore throat, rhinorrhea, dental problem, sinus pressure and tinnitus.   Eyes: Negative for pain, discharge and visual disturbance.  Respiratory: Negative for cough and shortness of breath.   Cardiovascular: Negative for chest pain, palpitations and leg swelling.  Gastrointestinal: Negative for nausea, vomiting, abdominal pain, diarrhea, constipation, blood in stool and abdominal distention.  Genitourinary: Negative for dysuria, urgency, frequency, hematuria, flank pain, vaginal bleeding, vaginal discharge, difficulty urinating, vaginal pain and pelvic pain.  Musculoskeletal: Positive for back pain. Negative for joint swelling, arthralgias and gait problem.  Skin: Negative for rash.  Neurological: Negative for dizziness, syncope, speech difficulty, weakness, numbness and headaches.  Hematological: Negative for adenopathy.  Psychiatric/Behavioral: Negative for  behavioral problems, dysphoric mood and agitation. The patient is not nervous/anxious.        Objective:   Physical Exam  Constitutional: She is oriented to person, place, and time. She appears well-developed and well-nourished.       Obese weight 263 blood pressure 110/70   HENT:  Head: Normocephalic.  Right Ear: External ear normal.  Left Ear: External ear normal.  Mouth/Throat: Oropharynx is clear and moist.  Eyes: Conjunctivae normal and EOM are normal. Pupils are equal, round, and reactive to light.  Neck: Normal range of motion. Neck supple. No thyromegaly present.  Cardiovascular: Normal rate, regular rhythm, normal heart sounds and intact distal pulses.   Pulmonary/Chest: Effort normal and breath sounds normal.  Abdominal: Soft. Bowel sounds are normal. She exhibits no mass. There is no tenderness.  Musculoskeletal: Normal range of motion.  Lymphadenopathy:    She has no cervical adenopathy.  Neurological: She is alert and oriented to person, place, and time.  Skin: Skin is warm and dry. No rash noted.  Psychiatric: She has a normal mood and affect. Her behavior is normal.          Assessment & Plan:  Diabetes mellitus type 2. Well controlled on oral agents. We'll schedule CPX in 4 months Hypertension excellent control on diuretic therapy. We'll consider ACE inhibition if additional blood pressure medications needed Obesity weight loss encouraged Chronic low back pain. Continue present regimen and efforts at weight loss  CPX 4 months

## 2011-12-21 NOTE — Patient Instructions (Signed)
Limit your sodium (Salt) intake    It is important that you exercise regularly, at least 20 minutes 3 to 4 times per week.  If you develop chest pain or shortness of breath seek  medical attention.  You need to lose weight.  Consider a lower calorie diet and regular exercise.   Please check your hemoglobin A1c every 3 months   

## 2012-01-05 ENCOUNTER — Other Ambulatory Visit: Payer: Self-pay | Admitting: Internal Medicine

## 2012-01-05 NOTE — Telephone Encounter (Signed)
Med filled.  

## 2012-01-09 ENCOUNTER — Other Ambulatory Visit: Payer: Self-pay | Admitting: Internal Medicine

## 2012-01-10 NOTE — Telephone Encounter (Signed)
Med filled.  

## 2012-01-25 ENCOUNTER — Other Ambulatory Visit (HOSPITAL_COMMUNITY): Payer: Self-pay | Admitting: Obstetrics & Gynecology

## 2012-01-25 DIAGNOSIS — Z1231 Encounter for screening mammogram for malignant neoplasm of breast: Secondary | ICD-10-CM

## 2012-02-14 ENCOUNTER — Ambulatory Visit (HOSPITAL_COMMUNITY)
Admission: RE | Admit: 2012-02-14 | Discharge: 2012-02-14 | Disposition: A | Discharge status: Home / Self Care | Payer: BC Managed Care – PPO | Source: Ambulatory Visit | Attending: Obstetrics & Gynecology | Admitting: Obstetrics & Gynecology

## 2012-02-14 DIAGNOSIS — Z1231 Encounter for screening mammogram for malignant neoplasm of breast: Secondary | ICD-10-CM | POA: Insufficient documentation

## 2012-04-12 ENCOUNTER — Other Ambulatory Visit: Payer: Self-pay | Admitting: Internal Medicine

## 2012-04-14 ENCOUNTER — Other Ambulatory Visit (INDEPENDENT_AMBULATORY_CARE_PROVIDER_SITE_OTHER): Payer: BC Managed Care – PPO

## 2012-04-14 DIAGNOSIS — Z0000 Encounter for general adult medical examination without abnormal findings: Secondary | ICD-10-CM

## 2012-04-14 LAB — POCT URINALYSIS DIPSTICK
Bilirubin, UA: NEGATIVE
Blood, UA: NEGATIVE
Glucose, UA: NEGATIVE
Ketones, UA: NEGATIVE
Leukocytes, UA: NEGATIVE
Nitrite, UA: NEGATIVE
Protein, UA: NEGATIVE
Spec Grav, UA: 1.01
Urobilinogen, UA: 0.2
pH, UA: 6.5

## 2012-04-14 LAB — CBC WITH DIFFERENTIAL/PLATELET
Basophils Absolute: 0 10*3/uL (ref 0.0–0.1)
Basophils Relative: 0.5 % (ref 0.0–3.0)
Eosinophils Absolute: 0.1 10*3/uL (ref 0.0–0.7)
Eosinophils Relative: 1.7 % (ref 0.0–5.0)
HCT: 36.3 % (ref 36.0–46.0)
Hemoglobin: 12.1 g/dL (ref 12.0–15.0)
Lymphocytes Relative: 27.6 % (ref 12.0–46.0)
Lymphs Abs: 2.1 10*3/uL (ref 0.7–4.0)
MCHC: 33.5 g/dL (ref 30.0–36.0)
MCV: 90.6 fl (ref 78.0–100.0)
Monocytes Absolute: 0.5 10*3/uL (ref 0.1–1.0)
Monocytes Relative: 6.8 % (ref 3.0–12.0)
Neutro Abs: 4.9 10*3/uL (ref 1.4–7.7)
Neutrophils Relative %: 63.4 % (ref 43.0–77.0)
Platelets: 203 10*3/uL (ref 150.0–400.0)
RBC: 4 Mil/uL (ref 3.87–5.11)
RDW: 14 % (ref 11.5–14.6)
WBC: 7.8 10*3/uL (ref 4.5–10.5)

## 2012-04-14 LAB — MICROALBUMIN / CREATININE URINE RATIO
Creatinine,U: 62.8 mg/dL
Microalb Creat Ratio: 0.3 mg/g (ref 0.0–30.0)
Microalb, Ur: 0.2 mg/dL (ref 0.0–1.9)

## 2012-04-14 LAB — BASIC METABOLIC PANEL
BUN: 13 mg/dL (ref 6–23)
CO2: 31 mEq/L (ref 19–32)
Calcium: 9.3 mg/dL (ref 8.4–10.5)
Chloride: 104 mEq/L (ref 96–112)
Creatinine, Ser: 0.6 mg/dL (ref 0.4–1.2)
GFR: 140.37 mL/min (ref >60.00)
Glucose, Bld: 97 mg/dL (ref 70–99)
Potassium: 3.4 mEq/L — ABNORMAL LOW (ref 3.5–5.1)
Sodium: 140 mEq/L (ref 135–145)

## 2012-04-14 LAB — HEPATIC FUNCTION PANEL
ALT: 17 U/L (ref 0–35)
AST: 17 U/L (ref 0–37)
Albumin: 3.6 g/dL (ref 3.5–5.2)
Alkaline Phosphatase: 62 U/L (ref 39–117)
Bilirubin, Direct: 0 mg/dL (ref 0.0–0.3)
Total Bilirubin: 0.4 mg/dL (ref 0.3–1.2)
Total Protein: 7.3 g/dL (ref 6.0–8.3)

## 2012-04-14 LAB — LIPID PANEL
Cholesterol: 175 mg/dL (ref 0–200)
HDL: 43.8 mg/dL (ref >39.00)
LDL Cholesterol: 116 mg/dL — ABNORMAL HIGH (ref 0–99)
Total CHOL/HDL Ratio: 4
Triglycerides: 75 mg/dL (ref 0.0–149.0)
VLDL: 15 mg/dL (ref 0.0–40.0)

## 2012-04-14 LAB — TSH: TSH: 1.69 u[IU]/mL (ref 0.35–5.50)

## 2012-04-14 LAB — HEMOGLOBIN A1C: Hgb A1c MFr Bld: 6 % (ref 4.6–6.5)

## 2012-04-21 ENCOUNTER — Encounter: Payer: Self-pay | Admitting: Internal Medicine

## 2012-04-21 ENCOUNTER — Ambulatory Visit (INDEPENDENT_AMBULATORY_CARE_PROVIDER_SITE_OTHER): Payer: BC Managed Care – PPO | Admitting: Internal Medicine

## 2012-04-21 VITALS — BP 120/70 | HR 83 | Temp 98.0°F | Resp 18 | Ht 63.0 in | Wt 256.0 lb

## 2012-04-21 DIAGNOSIS — E119 Type 2 diabetes mellitus without complications: Secondary | ICD-10-CM

## 2012-04-21 DIAGNOSIS — I10 Essential (primary) hypertension: Secondary | ICD-10-CM

## 2012-04-21 DIAGNOSIS — M1990 Unspecified osteoarthritis, unspecified site: Secondary | ICD-10-CM

## 2012-04-21 DIAGNOSIS — Z0000 Encounter for general adult medical examination without abnormal findings: Secondary | ICD-10-CM

## 2012-04-21 NOTE — Patient Instructions (Addendum)
Please check your hemoglobin A1c every 3 months  Limit your sodium (Salt) intake    It is important that you exercise regularly, at least 20 minutes 3 to 4 times per week.  If you develop chest pain or shortness of breath seek  medical attention.  You need to lose weight.  Consider a lower calorie diet and regular exercise. 

## 2012-04-21 NOTE — Progress Notes (Signed)
Subjective:    Patient ID: Darlene Mclaughlin, female    DOB: [birth year], 62 y.o.   MRN: [id number]  HPI  62 year old patient seen today for a wellness exam  Preventive Screening-Counseling & Management  Alcohol-Tobacco  Smoking Status: never   Allergies:  1) ! Naproxen Dr (Naproxen)   Past History:  Past Medical History:  Reviewed history from 05/15/2008 and no changes required.  Allergic rhinitis  Asthma  Headache  Hypertension  Osteoarthritis  Diabetes mellitus type 2 exogenous obesity   Past Surgical History:  gravida two, para two, abortus zero, status post C-sections 1972 in 1979  Hysterectomy 1992  arthroscopic left knee surgery x 2  colonoscopy [address])   Family History:  Reviewed history from 05/15/2008 and no changes required.  father died at 51, a sudden cardiac death with a history of hypercholesterolemia  mother, age 73 has type 2 diabetes and hypertension  Four brothers, one deceased from complications of diabetes also positive for hypertension  no sisters   Social History:  Reviewed history from 05/15/2008 and no changes required.  Married-widowed 9-10   two sons- one grandchild  school bus driver  Never Smoked    Past Medical History  Diagnosis Date  . ALLERGIC RHINITIS 05/15/2008  . ASTHMA 05/15/2008  . Headache 05/15/2008  . HYPERTENSION 05/15/2008  . IMPAIRED GLUCOSE TOLERANCE 05/15/2008  . OSTEOARTHRITIS 05/15/2008  . Obesity     History   Social History  . Marital Status: Widowed    Spouse Name: N/A    Number of Children: N/A  . Years of Education: N/A   Occupational History  . Not on file.   Social History Main Topics  . Smoking status: Never Smoker   . Smokeless tobacco: Never Used  . Alcohol Use: Yes     Comment: RARELY  . Drug Use: No  . Sexually Active: Not on file   Other Topics Concern  . Not on file   Social History Narrative  . No narrative on file    Past Surgical History  Procedure Laterality Date  .  Cesarean section    . Abdominal hysterectomy    . Knee surgery      arthroscopic left    Family History  Problem Relation Age of Onset  . Diabetes Mother   . Hypertension Mother   . Cancer Mother     Breast cancer  . Hyperlipidemia Father     Allergies  Allergen Reactions  . Naproxen     Current Outpatient Prescriptions on File Prior to Visit  Medication Sig Dispense Refill  . acetaminophen (TYLENOL) 650 MG CR tablet Take 650 mg by mouth every 8 (eight) hours as needed.        Marland Kitchen aspirin 81 MG tablet Take 81 mg by mouth daily.        . chlorthalidone (HYGROTON) 25 MG tablet TAKE 1 TABLET BY MOUTH EVERY DAY  90 tablet  3  . cyclobenzaprine (FLEXERIL) 5 MG tablet TAKE1 TABLET EVERY 8 HOURS AS NEEDED FOR MUSCLE SPASM  30 tablet  0  . glimepiride (AMARYL) 4 MG tablet TAKE 1/2 TABLET BY MOUTH EVERY MORNING.  45 tablet  3  . glucose blood (FREESTYLE LITE) test strip 1 each by Other route 2 (two) times daily. Use as instructed  100 each  12  . metFORMIN (GLUCOPHAGE XR) 500 MG 24 hr tablet Take 2 tablets (1,000 mg total) by mouth daily with supper.  180 tablet  3  . Multiple Vitamin (  MULTIVITAMIN) tablet Take 1 tablet by mouth daily.        . NON FORMULARY WEEKLY ALLERGY INJECTIONS       . potassium chloride SA (K-DUR,KLOR-CON) 20 MEQ tablet Take 1 tablet (20 mEq total) by mouth daily.  90 tablet  6  . traMADol (ULTRAM) 50 MG tablet Take 1 tablet (50 mg total) by mouth every 6 (six) hours as needed for pain.  50 tablet  4   No current facility-administered medications on file prior to visit.    BP 120/70  Pulse 83  Temp(Src) 98 F (36.7 C) (Oral)  Resp 18  Ht 5\' 3"  (1.6 m)  Wt 256 lb (116.121 kg)  BMI 45.36 kg/m2  SpO2 98%     Review of Systems  Constitutional: Negative.   HENT: Negative for hearing loss, congestion, sore throat, rhinorrhea, dental problem, sinus pressure and tinnitus.   Eyes: Negative for pain, discharge and visual disturbance.  Respiratory: Negative for  cough and shortness of breath.   Cardiovascular: Negative for chest pain, palpitations and leg swelling.  Gastrointestinal: Negative for nausea, vomiting, abdominal pain, diarrhea, constipation, blood in stool and abdominal distention.  Genitourinary: Negative for dysuria, urgency, frequency, hematuria, flank pain, vaginal bleeding, vaginal discharge, difficulty urinating, vaginal pain and pelvic pain.  Musculoskeletal: Negative for joint swelling, arthralgias and gait problem.  Skin: Negative for rash.  Neurological: Negative for dizziness, syncope, speech difficulty, weakness, numbness and headaches.  Hematological: Negative for adenopathy.  Psychiatric/Behavioral: Negative for behavioral problems, dysphoric mood and agitation. The patient is not nervous/anxious.        Objective:   Physical Exam  Constitutional: She is oriented to person, place, and time. She appears well-developed and well-nourished.  HENT:  Head: Normocephalic and atraumatic.  Right Ear: External ear normal.  Left Ear: External ear normal.  Mouth/Throat: Oropharynx is clear and moist.  Eyes: Conjunctivae and EOM are normal.  Neck: Normal range of motion. Neck supple. No JVD present. No thyromegaly present.  Cardiovascular: Normal rate, regular rhythm, normal heart sounds and intact distal pulses.   No murmur heard. Pulmonary/Chest: Effort normal and breath sounds normal. She has no wheezes. She has no rales.  Abdominal: Soft. Bowel sounds are normal. She exhibits no distension and no mass. There is no tenderness. There is no rebound and no guarding.  Musculoskeletal: Normal range of motion. She exhibits no edema and no tenderness.  Neurological: She is alert and oriented to person, place, and time. She has normal reflexes. No cranial nerve deficit. She exhibits normal muscle tone. Coordination normal.  Skin: Skin is warm and dry. No rash noted.  Psychiatric: She has a normal mood and affect. Her behavior is normal.           Assessment & Plan:   Preventive health examination Hypertension stable Type 2 diabetes.  Well-controlled. Hemoglobin A1c 6.0 Recheck 3 months

## 2012-05-18 ENCOUNTER — Other Ambulatory Visit: Payer: Self-pay | Admitting: Internal Medicine

## 2012-07-13 ENCOUNTER — Other Ambulatory Visit: Payer: Self-pay | Admitting: Internal Medicine

## 2012-07-19 ENCOUNTER — Encounter: Payer: Self-pay | Admitting: Internal Medicine

## 2012-07-19 ENCOUNTER — Ambulatory Visit (INDEPENDENT_AMBULATORY_CARE_PROVIDER_SITE_OTHER): Payer: BC Managed Care – PPO | Admitting: Internal Medicine

## 2012-07-19 VITALS — BP 110/66 | HR 82 | Temp 98.6°F | Resp 18 | Wt 263.0 lb

## 2012-07-19 DIAGNOSIS — M1990 Unspecified osteoarthritis, unspecified site: Secondary | ICD-10-CM

## 2012-07-19 DIAGNOSIS — I10 Essential (primary) hypertension: Secondary | ICD-10-CM

## 2012-07-19 DIAGNOSIS — E119 Type 2 diabetes mellitus without complications: Secondary | ICD-10-CM

## 2012-07-19 MED ORDER — FLUTICASONE PROPIONATE 50 MCG/ACT NA SUSP: 1.0000 | Freq: Every day | NASAL | Status: DC

## 2012-07-19 NOTE — Progress Notes (Signed)
  Subjective:    Patient ID: Darlene Mclaughlin, female    DOB: Sep 17, 1950, 62 y.o.   MRN: [id number]  HPI  62 year old patient who is seen today for followup. She has hypertension obesity and type 2 diabetes. Hemoglobin A1c is have basically been in a normal range on therapy. No concerns or complaints. No hypoglycemia. Was seen 3 months ago for an annual exam. Laboratory studies were reviewed.  Wt Readings from Last 3 Encounters:  07/19/12 263 lb (119.296 kg)  04/21/12 256 lb (116.121 kg)  12/21/11 263 lb (119.296 kg)      Review of Systems  Constitutional: Negative.   HENT: Negative for hearing loss, congestion, sore throat, rhinorrhea, dental problem, sinus pressure and tinnitus.   Eyes: Negative for pain, discharge and visual disturbance.  Respiratory: Negative for cough and shortness of breath.   Cardiovascular: Negative for chest pain, palpitations and leg swelling.  Gastrointestinal: Negative for nausea, vomiting, abdominal pain, diarrhea, constipation, blood in stool and abdominal distention.  Genitourinary: Negative for dysuria, urgency, frequency, hematuria, flank pain, vaginal bleeding, vaginal discharge, difficulty urinating, vaginal pain and pelvic pain.  Musculoskeletal: Negative for joint swelling, arthralgias and gait problem.  Skin: Negative for rash.  Neurological: Negative for dizziness, syncope, speech difficulty, weakness, numbness and headaches.  Hematological: Negative for adenopathy.  Psychiatric/Behavioral: Negative for behavioral problems, dysphoric mood and agitation. The patient is not nervous/anxious.        Objective:   Physical Exam  Constitutional: She is oriented to person, place, and time. She appears well-developed and well-nourished.  HENT:  Head: Normocephalic.  Right Ear: External ear normal.  Left Ear: External ear normal.  Mouth/Throat: Oropharynx is clear and moist.  Eyes: Conjunctivae and EOM are normal. Pupils are equal, round, and reactive  to light.  Neck: Normal range of motion. Neck supple. No thyromegaly present.  Cardiovascular: Normal rate, regular rhythm, normal heart sounds and intact distal pulses.   Pulmonary/Chest: Effort normal and breath sounds normal.  Abdominal: Soft. Bowel sounds are normal. She exhibits no mass. There is no tenderness.  Musculoskeletal: Normal range of motion.  Lymphadenopathy:    She has no cervical adenopathy.  Neurological: She is alert and oriented to person, place, and time.  Skin: Skin is warm and dry. No rash noted.  Psychiatric: She has a normal mood and affect. Her behavior is normal.          Assessment & Plan:      Diabetes mellitus-   Well controlled. We'll recheck in 5 months Exogenous obesity. Weight loss encouraged Hypertension stable  Medications refilled

## 2012-07-19 NOTE — Patient Instructions (Signed)
Limit your sodium (Salt) intake    It is important that you exercise regularly, at least 20 minutes 3 to 4 times per week.  If you develop chest pain or shortness of breath seek  medical attention.  You need to lose weight.  Consider a lower calorie diet and regular exercise. 

## 2012-08-03 ENCOUNTER — Other Ambulatory Visit: Payer: Self-pay | Admitting: Internal Medicine

## 2012-09-18 ENCOUNTER — Other Ambulatory Visit: Payer: Self-pay | Admitting: Internal Medicine

## 2012-11-15 ENCOUNTER — Other Ambulatory Visit: Payer: Self-pay | Admitting: Internal Medicine

## 2012-12-19 ENCOUNTER — Encounter: Payer: Self-pay | Admitting: Internal Medicine

## 2012-12-19 ENCOUNTER — Ambulatory Visit (INDEPENDENT_AMBULATORY_CARE_PROVIDER_SITE_OTHER): Payer: BC Managed Care – PPO | Admitting: Internal Medicine

## 2012-12-19 VITALS — BP 120/66 | HR 74 | Temp 98.2°F | Resp 20 | Wt 269.0 lb

## 2012-12-19 DIAGNOSIS — I10 Essential (primary) hypertension: Secondary | ICD-10-CM

## 2012-12-19 DIAGNOSIS — Z23 Encounter for immunization: Secondary | ICD-10-CM

## 2012-12-19 DIAGNOSIS — E119 Type 2 diabetes mellitus without complications: Secondary | ICD-10-CM

## 2012-12-19 DIAGNOSIS — E669 Obesity, unspecified: Secondary | ICD-10-CM

## 2012-12-19 LAB — HEMOGLOBIN A1C: Hgb A1c MFr Bld: 6.2 % (ref 4.6–6.5)

## 2012-12-19 NOTE — Progress Notes (Signed)
Subjective:    Patient ID: Darlene Mclaughlin, female    DOB: 08-Sep-1950, 62 y.o.   MRN: [id number]  HPI  44 ear old patient who is seen today for followup. She has type 2 diabetes which has been tightly controlled on oral agents. She has treated hypertension exogenous obesity and a history of asthma. No new concerns or complaints today. Unfortunately she continues to gain weight.  Past Medical History  Diagnosis Date  . ALLERGIC RHINITIS 05/15/2008  . ASTHMA 05/15/2008  . Headache(784.0) 05/15/2008  . HYPERTENSION 05/15/2008  . IMPAIRED GLUCOSE TOLERANCE 05/15/2008  . OSTEOARTHRITIS 05/15/2008  . Obesity     History   Social History  . Marital Status: Widowed    Spouse Name: N/A    Number of Children: N/A  . Years of Education: N/A   Occupational History  . Not on file.   Social History Main Topics  . Smoking status: Never Smoker   . Smokeless tobacco: Never Used  . Alcohol Use: Yes     Comment: RARELY  . Drug Use: No  . Sexual Activity: Not on file   Other Topics Concern  . Not on file   Social History Narrative  . No narrative on file    Past Surgical History  Procedure Laterality Date  . Cesarean section    . Abdominal hysterectomy    . Knee surgery      arthroscopic left    Family History  Problem Relation Age of Onset  . Diabetes Mother   . Hypertension Mother   . Cancer Mother     Breast cancer  . Hyperlipidemia Father     Allergies  Allergen Reactions  . Naproxen     Current Outpatient Prescriptions on File Prior to Visit  Medication Sig Dispense Refill  . acetaminophen (TYLENOL) 650 MG CR tablet Take 650 mg by mouth every 8 (eight) hours as needed.        Marland Kitchen aspirin 81 MG tablet Take 81 mg by mouth daily.        . chlorthalidone (HYGROTON) 25 MG tablet TAKE 1 TABLET BY MOUTH EVERY DAY  90 tablet  3  . cyclobenzaprine (FLEXERIL) 5 MG tablet TAKE 1 TABLET BY MOUTH EVERY 8 HOURS AS NEEDED FOR MUSCLE SPASMS  30 tablet  3  . fluticasone (FLONASE) 50  MCG/ACT nasal spray Place 1 spray into the nose daily.  16 g  6  . glimepiride (AMARYL) 4 MG tablet TAKE 1/2 TABLET (2 MG TOTAL) BY MOUTH EVERY MORNING.  45 tablet  3  . glucose blood (FREESTYLE LITE) test strip 1 each by Other route 2 (two) times daily. Use as instructed  100 each  12  . metFORMIN (GLUCOPHAGE-XR) 500 MG 24 hr tablet TAKE 2 TABLETS (1,000 MG TOTAL) BY MOUTH DAILY WITH SUPPER.  180 tablet  3  . Multiple Vitamin (MULTIVITAMIN) tablet Take 1 tablet by mouth daily.        . NON FORMULARY WEEKLY ALLERGY INJECTIONS       . potassium chloride SA (KLOR-CON M20) 20 MEQ tablet TAKE 1 TABLET DAILY.  90 tablet  2  . traMADol (ULTRAM) 50 MG tablet TAKE 1 TABLET (50 MG TOTAL) BY MOUTH EVERY 6 (SIX) HOURS AS NEEDED FOR PAIN.  50 tablet  4   No current facility-administered medications on file prior to visit.    BP 120/66  Pulse 74  Temp(Src) 98.2 F (36.8 C) (Oral)  Resp 20  Wt 269 lb (122.018 kg)  BMI 47.66 kg/m2  SpO2 99%       Review of Systems  Constitutional: Positive for unexpected weight change.  HENT: Negative for congestion, dental problem, hearing loss, rhinorrhea, sinus pressure, sore throat and tinnitus.   Eyes: Negative for pain, discharge and visual disturbance.  Respiratory: Negative for cough and shortness of breath.   Cardiovascular: Negative for chest pain, palpitations and leg swelling.  Gastrointestinal: Negative for nausea, vomiting, abdominal pain, diarrhea, constipation, blood in stool and abdominal distention.  Genitourinary: Negative for dysuria, urgency, frequency, hematuria, flank pain, vaginal bleeding, vaginal discharge, difficulty urinating, vaginal pain and pelvic pain.  Musculoskeletal: Negative for arthralgias, gait problem and joint swelling.  Skin: Negative for rash.  Neurological: Negative for dizziness, syncope, speech difficulty, weakness, numbness and headaches.  Hematological: Negative for adenopathy.  Psychiatric/Behavioral: Negative  for behavioral problems, dysphoric mood and agitation. The patient is not nervous/anxious.        Objective:   Physical Exam  Constitutional: She is oriented to person, place, and time. She appears well-developed and well-nourished.  Weight 269 Blood pressure 120/70  HENT:  Head: Normocephalic.  Right Ear: External ear normal.  Left Ear: External ear normal.  Mouth/Throat: Oropharynx is clear and moist.  Eyes: Conjunctivae and EOM are normal. Pupils are equal, round, and reactive to light.  Neck: Normal range of motion. Neck supple. No thyromegaly present.  Cardiovascular: Normal rate, regular rhythm, normal heart sounds and intact distal pulses.   Pulmonary/Chest: Effort normal and breath sounds normal.  Abdominal: Soft. Bowel sounds are normal. She exhibits no mass. There is no tenderness.  Musculoskeletal: Normal range of motion.  Lymphadenopathy:    She has no cervical adenopathy.  Neurological: She is alert and oriented to person, place, and time.  Skin: Skin is warm and dry. No rash noted.  Psychiatric: She has a normal mood and affect. Her behavior is normal.          Assessment & Plan:   Diabetes mellitus. We'll check a hemoglobin A1c. Weight loss encouraged Hypertension well controlled. We'll continue present therapy Exogenous obesity. Weight loss encouraged

## 2012-12-19 NOTE — Patient Instructions (Signed)

## 2012-12-19 NOTE — Progress Notes (Signed)
  Subjective:    Patient ID: Darlene Mclaughlin, female    DOB: 07-15-1950, 62 y.o.   MRN: [id number]  HPI  Wt Readings from Last 3 Encounters:  12/19/12 269 lb (122.018 kg)  07/19/12 263 lb (119.296 kg)  04/21/12 256 lb (116.121 kg)    Review of Systems     Objective:   Physical Exam        Assessment & Plan:

## 2013-01-02 ENCOUNTER — Other Ambulatory Visit: Payer: Self-pay | Admitting: Internal Medicine

## 2013-01-24 ENCOUNTER — Other Ambulatory Visit: Payer: Self-pay | Admitting: Internal Medicine

## 2013-03-22 ENCOUNTER — Encounter: Payer: Self-pay | Admitting: Family Medicine

## 2013-03-22 ENCOUNTER — Ambulatory Visit (INDEPENDENT_AMBULATORY_CARE_PROVIDER_SITE_OTHER): Payer: BC Managed Care – PPO | Admitting: Family Medicine

## 2013-03-22 VITALS — BP 126/68 | HR 87 | Temp 97.7°F | Wt 271.0 lb

## 2013-03-22 DIAGNOSIS — J329 Chronic sinusitis, unspecified: Secondary | ICD-10-CM

## 2013-03-22 DIAGNOSIS — L259 Unspecified contact dermatitis, unspecified cause: Secondary | ICD-10-CM

## 2013-03-22 MED ORDER — PREDNISONE 10 MG PO TABS: ORAL_TABLET | ORAL | Status: DC

## 2013-03-22 MED ORDER — DOXYCYCLINE HYCLATE 100 MG PO CAPS: 100.0000 mg | ORAL_CAPSULE | Freq: Two times a day (BID) | ORAL | Status: DC

## 2013-03-22 NOTE — Progress Notes (Signed)
Pre visit review using our clinic review tool, if applicable. No additional management support is needed unless otherwise documented below in the visit note. 

## 2013-03-22 NOTE — Progress Notes (Signed)
   Subjective:    Patient ID: Darlene Mclaughlin, female    DOB: 20-Dec-1950, 63 y.o.   MRN: [id number]  HPI Acute visit. Patient seen as a work-in. 2 week history sinus congestion and facial pain. She's had some yellowish nasal discharge. Intermittent malaise. No fever. She's tried over-the-counter medications without much improvement. Rare cough. Thick yellow nasal discharge. Occasional bloody discharge.  Patient also describes a small bump on the left side of her nose about 2 weeks ago. She popped this and started using some topical Neosporin. She now has pruritic rash which is spreading around this region. No further pustules. No fever. No history of MRSA.  Past Medical History  Diagnosis Date  . ALLERGIC RHINITIS 05/15/2008  . ASTHMA 05/15/2008  . Headache(784.0) 05/15/2008  . HYPERTENSION 05/15/2008  . IMPAIRED GLUCOSE TOLERANCE 05/15/2008  . OSTEOARTHRITIS 05/15/2008  . Obesity    Past Surgical History  Procedure Laterality Date  . Cesarean section    . Abdominal hysterectomy    . Knee surgery      arthroscopic left    reports that she has never smoked. She has never used smokeless tobacco. She reports that she drinks alcohol. She reports that she does not use illicit drugs. family history includes Cancer in her mother; Diabetes in her mother; Hyperlipidemia in her father; Hypertension in her mother. Allergies  Allergen Reactions  . Naproxen       Review of Systems  Constitutional: Negative for fever and chills.  HENT: Positive for congestion and sinus pressure. Negative for sore throat.   Respiratory: Positive for cough. Negative for shortness of breath.   Cardiovascular: Negative for chest pain.  Neurological: Negative for headaches.  Hematological: Negative for adenopathy.       Objective:   Physical Exam  Constitutional: She appears well-developed and well-nourished.  HENT:  Right Ear: External ear normal.  Left Ear: External ear normal.  Nose: Nose normal.    Mouth/Throat: Oropharynx is clear and moist.  Neck: Neck supple.  Cardiovascular: Normal rate.   Pulmonary/Chest: Effort normal and breath sounds normal. No respiratory distress. She has no wheezes. She has no rales.  Lymphadenopathy:    She has no cervical adenopathy.  Skin: Rash noted.  Patient has slightly raised erythematous rash left side of nose extending to left cheek region. Nontender. No warmth to touch. No pustules. No vesicles.          Assessment & Plan:  #1 acute sinusitis. Given duration of symptoms start doxycycline 100 mg twice a day for 10 days #2 probable contact dermatitis left face secondary to Neosporin. She describes initial pustule followed by Neosporin and pruritic rash. Her current rash is not consistent with cellulitis. Nevertheless, she will be on doxycycline. We've also recommended brief prednisone taper as she has tried topical hydrocortisone cream without improvement. She will leave off Neosporin

## 2013-03-22 NOTE — Patient Instructions (Signed)
Contact Dermatitis Contact dermatitis is a reaction to certain substances that touch the skin. Contact dermatitis can be either irritant contact dermatitis or allergic contact dermatitis. Irritant contact dermatitis does not require previous exposure to the substance for a reaction to occur.Allergic contact dermatitis only occurs if you have been exposed to the substance before. Upon a repeat exposure, your body reacts to the substance.  CAUSES  Many substances can cause contact dermatitis. Irritant dermatitis is most commonly caused by repeated exposure to mildly irritating substances, such as:  Makeup.  Soaps.  Detergents.  Bleaches.  Acids.  Metal salts, such as nickel. Allergic contact dermatitis is most commonly caused by exposure to:  Poisonous plants.  Chemicals (deodorants, shampoos).  Jewelry.  Latex.  Neomycin in triple antibiotic cream.  Preservatives in products, including clothing. SYMPTOMS  The area of skin that is exposed may develop:  Dryness or flaking.  Redness.  Cracks.  Itching.  Pain or a burning sensation.  Blisters. With allergic contact dermatitis, there may also be swelling in areas such as the eyelids, mouth, or genitals.  DIAGNOSIS  Your caregiver can usually tell what the problem is by doing a physical exam. In cases where the cause is uncertain and an allergic contact dermatitis is suspected, a patch skin test may be performed to help determine the cause of your dermatitis. TREATMENT Treatment includes protecting the skin from further contact with the irritating substance by avoiding that substance if possible. Barrier creams, powders, and gloves may be helpful. Your caregiver may also recommend:  Steroid creams or ointments applied 2 times daily. For best results, soak the rash area in cool water for 20 minutes. Then apply the medicine. Cover the area with a plastic wrap. You can store the steroid cream in the refrigerator for a "chilly"  effect on your rash. That may decrease itching. Oral steroid medicines may be needed in more severe cases.  Antibiotics or antibacterial ointments if a skin infection is present.  Antihistamine lotion or an antihistamine taken by mouth to ease itching.  Lubricants to keep moisture in your skin.  Burow's solution to reduce redness and soreness or to dry a weeping rash. Mix one packet or tablet of solution in 2 cups cool water. Dip a clean washcloth in the mixture, wring it out a bit, and put it on the affected area. Leave the cloth in place for 30 minutes. Do this as often as possible throughout the day.  Taking several cornstarch or baking soda baths daily if the area is too large to cover with a washcloth. Harsh chemicals, such as alkalis or acids, can cause skin damage that is like a burn. You should flush your skin for 15 to 20 minutes with cold water after such an exposure. You should also seek immediate medical care after exposure. Bandages (dressings), antibiotics, and pain medicine may be needed for severely irritated skin.  HOME CARE INSTRUCTIONS  Avoid the substance that caused your reaction.  Keep the area of skin that is affected away from hot water, soap, sunlight, chemicals, acidic substances, or anything else that would irritate your skin.  Do not scratch the rash. Scratching may cause the rash to become infected.  You may take cool baths to help stop the itching.  Only take over-the-counter or prescription medicines as directed by your caregiver.  See your caregiver for follow-up care as directed to make sure your skin is healing properly. SEEK MEDICAL CARE IF:   Your condition is not better after 3   days of treatment.  You seem to be getting worse.  You see signs of infection such as swelling, tenderness, redness, soreness, or warmth in the affected area.  You have any problems related to your medicines. Document Released: 02/06/2000 Document Revised: 05/03/2011  Document Reviewed: 07/14/2010 [facility] Patient Information 2014 [place], [place]. Sinusitis Sinusitis is redness, soreness, and swelling (inflammation) of the paranasal sinuses. Paranasal sinuses are air pockets within the bones of your face (beneath the eyes, the middle of the forehead, or above the eyes). In healthy paranasal sinuses, mucus is able to drain out, and air is able to circulate through them by way of your nose. However, when your paranasal sinuses are inflamed, mucus and air can become trapped. This can allow bacteria and other germs to grow and cause infection. Sinusitis can develop quickly and last only a short time (acute) or continue over a long period (chronic). Sinusitis that lasts for more than 12 weeks is considered chronic.  CAUSES  Causes of sinusitis include:  Allergies.  Structural abnormalities, such as displacement of the cartilage that separates your nostrils (deviated septum), which can decrease the air flow through your nose and sinuses and affect sinus drainage.  Functional abnormalities, such as when the small hairs (cilia) that line your sinuses and help remove mucus do not work properly or are not present. SYMPTOMS  Symptoms of acute and chronic sinusitis are the same. The primary symptoms are pain and pressure around the affected sinuses. Other symptoms include:  Upper toothache.  Earache.  Headache.  Bad breath.  Decreased sense of smell and taste.  A cough, which worsens when you are lying flat.  Fatigue.  Fever.  Thick drainage from your nose, which often is green and may contain pus (purulent).  Swelling and warmth over the affected sinuses. DIAGNOSIS  Your caregiver will perform a physical exam. During the exam, your caregiver may:  Look in your nose for signs of abnormal growths in your nostrils (nasal polyps).  Tap over the affected sinus to check for signs of infection.  View the inside of your sinuses (endoscopy) with a special  imaging device with a light attached (endoscope), which is inserted into your sinuses. If your caregiver suspects that you have chronic sinusitis, one or more of the following tests may be recommended:  Allergy tests.  Nasal culture A sample of mucus is taken from your nose and sent to a lab and screened for bacteria.  Nasal cytology A sample of mucus is taken from your nose and examined by your caregiver to determine if your sinusitis is related to an allergy. TREATMENT  Most cases of acute sinusitis are related to a viral infection and will resolve on their own within 10 days. Sometimes medicines are prescribed to help relieve symptoms (pain medicine, decongestants, nasal steroid sprays, or saline sprays).  However, for sinusitis related to a bacterial infection, your caregiver will prescribe antibiotic medicines. These are medicines that will help kill the bacteria causing the infection.  Rarely, sinusitis is caused by a fungal infection. In theses cases, your caregiver will prescribe antifungal medicine. For some cases of chronic sinusitis, surgery is needed. Generally, these are cases in which sinusitis recurs more than 3 times per year, despite other treatments. HOME CARE INSTRUCTIONS   Drink plenty of water. Water helps thin the mucus so your sinuses can drain more easily.  Use a humidifier.  Inhale steam 3 to 4 times a day (for example, sit in the bathroom with the shower running).  Apply a warm, moist washcloth to your face 3 to 4 times a day, or as directed by your caregiver.  Use saline nasal sprays to help moisten and clean your sinuses.  Take over-the-counter or prescription medicines for pain, discomfort, or fever only as directed by your caregiver. SEEK IMMEDIATE MEDICAL CARE IF:  You have increasing pain or severe headaches.  You have nausea, vomiting, or drowsiness.  You have swelling around your face.  You have vision problems.  You have a stiff neck.  You have  difficulty breathing. MAKE SURE YOU:   Understand these instructions.  Will watch your condition.  Will get help right away if you are not doing well or get worse. Document Released: 02/08/2005 Document Revised: 05/03/2011 Document Reviewed: 02/23/2011 [facility] Patient Information 2014 [place], [place].

## 2013-05-11 ENCOUNTER — Other Ambulatory Visit: Payer: Self-pay | Admitting: Internal Medicine

## 2013-05-11 DIAGNOSIS — Z1231 Encounter for screening mammogram for malignant neoplasm of breast: Secondary | ICD-10-CM

## 2013-05-18 ENCOUNTER — Ambulatory Visit (HOSPITAL_COMMUNITY)
Admission: RE | Admit: 2013-05-18 | Discharge: 2013-05-18 | Disposition: A | Discharge status: Home / Self Care | Payer: BC Managed Care – PPO | Source: Ambulatory Visit | Attending: Internal Medicine | Admitting: Internal Medicine

## 2013-05-18 DIAGNOSIS — Z1231 Encounter for screening mammogram for malignant neoplasm of breast: Secondary | ICD-10-CM | POA: Insufficient documentation

## 2013-05-28 ENCOUNTER — Other Ambulatory Visit (INDEPENDENT_AMBULATORY_CARE_PROVIDER_SITE_OTHER): Payer: BC Managed Care – PPO

## 2013-05-28 DIAGNOSIS — Z0000 Encounter for general adult medical examination without abnormal findings: Secondary | ICD-10-CM

## 2013-05-28 LAB — LIPID PANEL
Cholesterol: 197 mg/dL (ref 0–200)
HDL: 51.4 mg/dL (ref >39.00)
LDL Cholesterol: 123 mg/dL — ABNORMAL HIGH (ref 0–99)
Total CHOL/HDL Ratio: 4
Triglycerides: 111 mg/dL (ref 0.0–149.0)
VLDL: 22.2 mg/dL (ref 0.0–40.0)

## 2013-05-28 LAB — CBC WITH DIFFERENTIAL/PLATELET
Basophils Absolute: 0 10*3/uL (ref 0.0–0.1)
Basophils Relative: 0.4 % (ref 0.0–3.0)
Eosinophils Absolute: 0.2 10*3/uL (ref 0.0–0.7)
Eosinophils Relative: 2.4 % (ref 0.0–5.0)
HCT: 39.7 % (ref 36.0–46.0)
Hemoglobin: 13.1 g/dL (ref 12.0–15.0)
Lymphocytes Relative: 27.6 % (ref 12.0–46.0)
Lymphs Abs: 1.9 10*3/uL (ref 0.7–4.0)
MCHC: 33 g/dL (ref 30.0–36.0)
MCV: 93.1 fl (ref 78.0–100.0)
Monocytes Absolute: 0.6 10*3/uL (ref 0.1–1.0)
Monocytes Relative: 7.9 % (ref 3.0–12.0)
Neutro Abs: 4.3 10*3/uL (ref 1.4–7.7)
Neutrophils Relative %: 61.7 % (ref 43.0–77.0)
Platelets: 208 10*3/uL (ref 150.0–400.0)
RBC: 4.26 Mil/uL (ref 3.87–5.11)
RDW: 14 % (ref 11.5–14.6)
WBC: 7 10*3/uL (ref 4.5–10.5)

## 2013-05-28 LAB — BASIC METABOLIC PANEL
BUN: 19 mg/dL (ref 6–23)
CO2: 30 mEq/L (ref 19–32)
Calcium: 9.3 mg/dL (ref 8.4–10.5)
Chloride: 102 mEq/L (ref 96–112)
Creatinine, Ser: 0.6 mg/dL (ref 0.4–1.2)
GFR: 137.05 mL/min (ref >60.00)
Glucose, Bld: 106 mg/dL — ABNORMAL HIGH (ref 70–99)
Potassium: 3.4 mEq/L — ABNORMAL LOW (ref 3.5–5.1)
Sodium: 140 mEq/L (ref 135–145)

## 2013-05-28 LAB — POCT URINALYSIS DIPSTICK
Bilirubin, UA: NEGATIVE
Blood, UA: NEGATIVE
Glucose, UA: NEGATIVE
Ketones, UA: NEGATIVE
Leukocytes, UA: NEGATIVE
Nitrite, UA: NEGATIVE
Protein, UA: NEGATIVE
Spec Grav, UA: 1.02
Urobilinogen, UA: 0.2
pH, UA: 6

## 2013-05-28 LAB — HEPATIC FUNCTION PANEL
ALT: 22 U/L (ref 0–35)
AST: 19 U/L (ref 0–37)
Albumin: 3.8 g/dL (ref 3.5–5.2)
Alkaline Phosphatase: 53 U/L (ref 39–117)
Bilirubin, Direct: 0 mg/dL (ref 0.0–0.3)
Total Bilirubin: 0.5 mg/dL (ref 0.3–1.2)
Total Protein: 7.6 g/dL (ref 6.0–8.3)

## 2013-05-28 LAB — MICROALBUMIN / CREATININE URINE RATIO
Creatinine,U: 139.8 mg/dL
Microalb Creat Ratio: 0.4 mg/g (ref 0.0–30.0)
Microalb, Ur: 0.5 mg/dL (ref 0.0–1.9)

## 2013-05-28 LAB — TSH: TSH: 0.88 u[IU]/mL (ref 0.35–5.50)

## 2013-05-28 LAB — HEMOGLOBIN A1C: Hgb A1c MFr Bld: 6.4 % (ref 4.6–6.5)

## 2013-05-28 NOTE — Addendum Note (Signed)
Addended by: Elmer Picker on: 05/28/2013 08:05 AM   Modules accepted: Orders

## 2013-06-04 ENCOUNTER — Encounter: Payer: Self-pay | Admitting: Internal Medicine

## 2013-06-04 ENCOUNTER — Ambulatory Visit (INDEPENDENT_AMBULATORY_CARE_PROVIDER_SITE_OTHER): Payer: BC Managed Care – PPO | Admitting: Internal Medicine

## 2013-06-04 VITALS — BP 130/82 | HR 87 | Temp 98.6°F | Resp 20 | Ht 64.0 in | Wt 273.0 lb

## 2013-06-04 DIAGNOSIS — I10 Essential (primary) hypertension: Secondary | ICD-10-CM

## 2013-06-04 DIAGNOSIS — M1990 Unspecified osteoarthritis, unspecified site: Secondary | ICD-10-CM

## 2013-06-04 DIAGNOSIS — J45909 Unspecified asthma, uncomplicated: Secondary | ICD-10-CM

## 2013-06-04 DIAGNOSIS — Z23 Encounter for immunization: Secondary | ICD-10-CM

## 2013-06-04 DIAGNOSIS — E669 Obesity, unspecified: Secondary | ICD-10-CM

## 2013-06-04 DIAGNOSIS — E119 Type 2 diabetes mellitus without complications: Secondary | ICD-10-CM

## 2013-06-04 MED ORDER — TRAMADOL HCL 50 MG PO TABS: ORAL_TABLET | ORAL | Status: DC

## 2013-06-04 NOTE — Progress Notes (Signed)
Subjective:    Patient ID: Darlene Mclaughlin, female    DOB: 1950-05-22, 63 y.o.   MRN: [id number]  HPI   63 year-old patient seen today for a wellness exam  Wt Readings from Last 3 Encounters:  06/04/13 273 lb (123.832 kg)  03/22/13 271 lb (122.925 kg)  12/19/12 269 lb (122.018 kg)    Preventive Screening-Counseling & Management  Alcohol-Tobacco  Smoking Status: never   Allergies:  1) ! Naproxen Dr (Naproxen)   Past History:  Past Medical History:   Allergic rhinitis  Asthma  Headache  Hypertension  Osteoarthritis  Diabetes mellitus type 2 exogenous obesity   Past Surgical History:  gravida two, para two, abortus zero, status post C-sections 1972 in 1979  Hysterectomy 1992  arthroscopic left knee surgery x 2  colonoscopy 2008 [name]   Family History:   father died at 41, a sudden cardiac death with a history of hypercholesterolemia  mother, age 70 has type 2 diabetes and hypertension  Four brothers, one deceased from complications of diabetes also positive for hypertension  no sisters   Social History:   Married-widowed 9-10   two sons- one grandchild  school bus driver  Never Smoked    Past Medical History  Diagnosis Date  . ALLERGIC RHINITIS 05/15/2008  . ASTHMA 05/15/2008  . Headache(784.0) 05/15/2008  . HYPERTENSION 05/15/2008  . IMPAIRED GLUCOSE TOLERANCE 05/15/2008  . OSTEOARTHRITIS 05/15/2008  . Obesity     History   Social History  . Marital Status: Widowed    Spouse Name: N/A    Number of Children: N/A  . Years of Education: N/A   Occupational History  . Not on file.   Social History Main Topics  . Smoking status: Never Smoker   . Smokeless tobacco: Never Used  . Alcohol Use: Yes     Comment: RARELY  . Drug Use: No  . Sexual Activity: Not on file   Other Topics Concern  . Not on file   Social History Narrative  . No narrative on file    Past Surgical History  Procedure Laterality Date  . Cesarean section    . Abdominal  hysterectomy    . Knee surgery      arthroscopic left    Family History  Problem Relation Age of Onset  . Diabetes Mother   . Hypertension Mother   . Cancer Mother     Breast cancer  . Hyperlipidemia Father     Allergies  Allergen Reactions  . Naproxen     Current Outpatient Prescriptions on File Prior to Visit  Medication Sig Dispense Refill  . acetaminophen (TYLENOL) 650 MG CR tablet Take 650 mg by mouth every 8 (eight) hours as needed.        Marland Kitchen aspirin 81 MG tablet Take 81 mg by mouth daily.        . chlorthalidone (HYGROTON) 25 MG tablet TAKE 1 TABLET (25 MG TOTAL) BY MOUTH DAILY.  90 tablet  3  . cyclobenzaprine (FLEXERIL) 5 MG tablet TAKE 1 TABLET BY MOUTH EVERY 8 HOURS AS NEEDED FOR MUSCLE SPASMS  30 tablet  3  . fluticasone (FLONASE) 50 MCG/ACT nasal spray Place 1 spray into the nose daily.  16 g  6  . glimepiride (AMARYL) 4 MG tablet TAKE 1/2 TABLET (2 MG TOTAL) BY MOUTH EVERY MORNING.  45 tablet  3  . glucose blood (FREESTYLE LITE) test strip 1 each by Other route 2 (two) times daily. Use as instructed  100  each  12  . metFORMIN (GLUCOPHAGE-XR) 500 MG 24 hr tablet TAKE 2 TABLETS (1,000 MG TOTAL) BY MOUTH DAILY WITH SUPPER.  180 tablet  3  . Multiple Vitamin (MULTIVITAMIN) tablet Take 1 tablet by mouth daily.        . NON FORMULARY WEEKLY ALLERGY INJECTIONS       . potassium chloride SA (KLOR-CON M20) 20 MEQ tablet TAKE 1 TABLET DAILY.  90 tablet  2   No current facility-administered medications on file prior to visit.    BP 130/82  Pulse 87  Temp(Src) 98.6 F (37 C) (Oral)  Resp 20  Ht 5\' 4"  (1.626 m)  Wt 273 lb (123.832 kg)  BMI 46.84 kg/m2  SpO2 97%     Review of Systems  Constitutional: Negative.   HENT: Negative for congestion, dental problem, hearing loss, rhinorrhea, sinus pressure, sore throat and tinnitus.   Eyes: Negative for pain, discharge and visual disturbance.  Respiratory: Negative for cough and shortness of breath.   Cardiovascular:  Negative for chest pain, palpitations and leg swelling.  Gastrointestinal: Negative for nausea, vomiting, abdominal pain, diarrhea, constipation, blood in stool and abdominal distention.  Genitourinary: Negative for dysuria, urgency, frequency, hematuria, flank pain, vaginal bleeding, vaginal discharge, difficulty urinating, vaginal pain and pelvic pain.  Musculoskeletal: Negative for arthralgias, gait problem and joint swelling.  Skin: Negative for rash.  Neurological: Negative for dizziness, syncope, speech difficulty, weakness, numbness and headaches.  Hematological: Negative for adenopathy.  Psychiatric/Behavioral: Negative for behavioral problems, dysphoric mood and agitation. The patient is not nervous/anxious.        Objective:   Physical Exam  Constitutional: She is oriented to person, place, and time. She appears well-developed and well-nourished.  HENT:  Head: Normocephalic and atraumatic.  Right Ear: External ear normal.  Left Ear: External ear normal.  Mouth/Throat: Oropharynx is clear and moist.  Eyes: Conjunctivae and EOM are normal.  Neck: Normal range of motion. Neck supple. No JVD present. No thyromegaly present.  Cardiovascular: Normal rate, regular rhythm, normal heart sounds and intact distal pulses.   No murmur heard. Pulmonary/Chest: Effort normal and breath sounds normal. She has no wheezes. She has no rales.  Abdominal: Soft. Bowel sounds are normal. She exhibits no distension and no mass. There is no tenderness. There is no rebound and no guarding.  Musculoskeletal: Normal range of motion. She exhibits no edema and no tenderness.  Neurological: She is alert and oriented to person, place, and time. She has normal reflexes. No cranial nerve deficit. She exhibits normal muscle tone. Coordination normal.  Skin: Skin is warm and dry. No rash noted.  Psychiatric: She has a normal mood and affect. Her behavior is normal.          Assessment & Plan:   Preventive  health examination Hypertension stable Type 2 diabetes.  Well-controlled. Hemoglobin A1c 6.0 Recheck 6 months  Will discuss statin therapy Eye  Examination recommended

## 2013-06-04 NOTE — Patient Instructions (Addendum)
You need to lose weight.  Consider a lower calorie diet and regular exercise.    It is important that you exercise regularly, at least 20 minutes 3 to 4 times per week.  If you develop chest pain or shortness of breath seek  medical attention.  Please see your eye doctor yearly to check for diabetic eye damage

## 2013-06-04 NOTE — Progress Notes (Signed)
Pre-visit discussion using our clinic review tool. No additional management support is needed unless otherwise documented below in the visit note.  

## 2013-06-10 ENCOUNTER — Other Ambulatory Visit: Payer: Self-pay | Admitting: Internal Medicine

## 2013-06-18 ENCOUNTER — Telehealth: Payer: Self-pay

## 2013-06-18 NOTE — Telephone Encounter (Signed)
Relevant patient education mailed to patient.  

## 2013-07-09 ENCOUNTER — Other Ambulatory Visit: Payer: Self-pay | Admitting: Internal Medicine

## 2013-07-31 ENCOUNTER — Other Ambulatory Visit: Payer: Self-pay | Admitting: Internal Medicine

## 2013-08-10 ENCOUNTER — Other Ambulatory Visit: Payer: Self-pay | Admitting: Internal Medicine

## 2013-12-05 ENCOUNTER — Encounter: Payer: Self-pay | Admitting: Internal Medicine

## 2013-12-05 ENCOUNTER — Ambulatory Visit (INDEPENDENT_AMBULATORY_CARE_PROVIDER_SITE_OTHER): Payer: Medicare Other | Admitting: Internal Medicine

## 2013-12-05 VITALS — BP 110/60 | HR 71 | Temp 98.0°F | Resp 20 | Ht 64.0 in | Wt 270.0 lb

## 2013-12-05 DIAGNOSIS — M4806 Spinal stenosis, lumbar region: Secondary | ICD-10-CM

## 2013-12-05 DIAGNOSIS — E119 Type 2 diabetes mellitus without complications: Secondary | ICD-10-CM

## 2013-12-05 DIAGNOSIS — I10 Essential (primary) hypertension: Secondary | ICD-10-CM

## 2013-12-05 DIAGNOSIS — M48061 Spinal stenosis, lumbar region without neurogenic claudication: Secondary | ICD-10-CM | POA: Insufficient documentation

## 2013-12-05 DIAGNOSIS — Z23 Encounter for immunization: Secondary | ICD-10-CM

## 2013-12-05 LAB — HEMOGLOBIN A1C: Hgb A1c MFr Bld: 6.1 % (ref 4.6–6.5)

## 2013-12-05 NOTE — Progress Notes (Signed)
Subjective:    Patient ID: Darlene Mclaughlin, female    DOB: 22-Jan-1951, 63 y.o.   MRN: [id number]  HPI BP Readings from Last 3 Encounters:  12/05/13 110/60  06/04/13 130/82  03/22/13 126/68    Wt Readings from Last 3 Encounters:  12/05/13 270 lb (122.471 kg)  06/04/13 273 lb (123.832 kg)  03/22/13 271 lb (122.55 kg)   63 year old patient who is seen today for followup.  She has type 2 diabetes, which has been under excellent control.  She has treated hypertension.  She has a history of excised obesity and osteoarthritis.  She has moderately severe spinal stenosis and continues to have significant low back and right leg pain.  A lumbar MRI was performed in 2007 Otherwise, no complaints Denies any cardiopulmonary complaints On examination, earlier this year CPX 6 months ago   Past Medical History  Diagnosis Date  . ALLERGIC RHINITIS 05/15/2008  . ASTHMA 05/15/2008  . Headache(784.0) 05/15/2008  . HYPERTENSION 05/15/2008  . IMPAIRED GLUCOSE TOLERANCE 05/15/2008  . OSTEOARTHRITIS 05/15/2008  . Obesity     History   Social History  . Marital Status: Widowed    Spouse Name: N/A    Number of Children: N/A  . Years of Education: N/A   Occupational History  . Not on file.   Social History Main Topics  . Smoking status: Never Smoker   . Smokeless tobacco: Never Used  . Alcohol Use: Yes     Comment: RARELY  . Drug Use: No  . Sexual Activity: Not on file   Other Topics Concern  . Not on file   Social History Narrative  . No narrative on file    Past Surgical History  Procedure Laterality Date  . Cesarean section    . Abdominal hysterectomy    . Knee surgery      arthroscopic left    Family History  Problem Relation Age of Onset  . Diabetes Mother   . Hypertension Mother   . Cancer Mother     Breast cancer  . Hyperlipidemia Father     Allergies  Allergen Reactions  . Naproxen     Current Outpatient Prescriptions on File Prior to Visit  Medication Sig  Dispense Refill  . acetaminophen (TYLENOL) 650 MG CR tablet Take 650 mg by mouth every 8 (eight) hours as needed.        Marland Kitchen aspirin 81 MG tablet Take 81 mg by mouth daily.        . chlorthalidone (HYGROTON) 25 MG tablet TAKE 1 TABLET (25 MG TOTAL) BY MOUTH DAILY.  90 tablet  3  . cyclobenzaprine (FLEXERIL) 5 MG tablet TAKE 1 TABLET BY MOUTH EVERY 8 HOURS AS NEEDED FOR MUSCLE SPASMS  30 tablet  3  . cyclobenzaprine (FLEXERIL) 5 MG tablet TAKE 1 TABLET BY MOUTH EVERY 8 HOURS AS NEEDED FOR MUSCLE SPASMS  30 tablet  5  . fluticasone (FLONASE) 50 MCG/ACT nasal spray Place 1 spray into the nose daily.  16 g  6  . glimepiride (AMARYL) 4 MG tablet TAKE 1/2 TABLET (2 MG TOTAL) BY MOUTH EVERY MORNING.  45 tablet  1  . glucose blood (FREESTYLE LITE) test strip 1 each by Other route 2 (two) times daily. Use as instructed  100 each  12  . KLOR-CON M20 20 MEQ tablet TAKE 1 TABLET DAILY.  90 tablet  1  . metFORMIN (GLUCOPHAGE-XR) 500 MG 24 hr tablet TAKE 2 TABLETS (1,000 MG TOTAL) BY MOUTH DAILY  WITH SUPPER.  180 tablet  1  . Multiple Vitamin (MULTIVITAMIN) tablet Take 1 tablet by mouth daily.        . NON FORMULARY WEEKLY ALLERGY INJECTIONS       . traMADol (ULTRAM) 50 MG tablet TAKE 1 TABLET (50 MG TOTAL) BY MOUTH EVERY 6 (SIX) HOURS AS NEEDED FOR PAIN.  50 tablet  2   No current facility-administered medications on file prior to visit.    BP 110/60  Pulse 71  Temp(Src) 98 F (36.7 C) (Oral)  Resp 20  Ht 5\' 4"  (1.626 m)  Wt 270 lb (122.471 kg)  BMI 46.32 kg/m2  SpO2 99%     Review of Systems  Constitutional: Negative.   HENT: Negative for congestion, dental problem, hearing loss, rhinorrhea, sinus pressure, sore throat and tinnitus.   Eyes: Negative for pain, discharge and visual disturbance.  Respiratory: Negative for cough and shortness of breath.   Cardiovascular: Negative for chest pain, palpitations and leg swelling.  Gastrointestinal: Negative for nausea, vomiting, abdominal pain,  diarrhea, constipation, blood in stool and abdominal distention.  Genitourinary: Negative for dysuria, urgency, frequency, hematuria, flank pain, vaginal bleeding, vaginal discharge, difficulty urinating, vaginal pain and pelvic pain.  Musculoskeletal: Positive for back pain. Negative for arthralgias, gait problem and joint swelling.  Skin: Negative for rash.  Neurological: Negative for dizziness, syncope, speech difficulty, weakness, numbness and headaches.  Hematological: Negative for adenopathy.  Psychiatric/Behavioral: Negative for behavioral problems, dysphoric mood and agitation. The patient is not nervous/anxious.        Objective:   Physical Exam  Constitutional: She is oriented to person, place, and time. She appears well-developed and well-nourished.  HENT:  Head: Normocephalic.  Right Ear: External ear normal.  Left Ear: External ear normal.  Mouth/Throat: Oropharynx is clear and moist.  Eyes: Conjunctivae and EOM are normal. Pupils are equal, round, and reactive to light.  Neck: Normal range of motion. Neck supple. No thyromegaly present.  Cardiovascular: Normal rate, regular rhythm, normal heart sounds and intact distal pulses.   Decreased right dorsalis pedis pulse  Pulmonary/Chest: Effort normal and breath sounds normal.  Abdominal: Soft. Bowel sounds are normal. She exhibits no mass. There is no tenderness.  Musculoskeletal: Normal range of motion.  Lymphadenopathy:    She has no cervical adenopathy.  Neurological: She is alert and oriented to person, place, and time.  Skin: Skin is warm and dry. No rash noted.  Psychiatric: She has a normal mood and affect. Her behavior is normal.          Assessment & Plan:   Diabetes mellitus.  Appears to be under excellent control.  We'll check a hemoglobin A1c Hypertension well controlled Symptomatic spinal stenosis.  Options discussed.  She will consider osteoarthritis Exogenous obesity  CPX 6 months

## 2013-12-05 NOTE — Patient Instructions (Signed)
You need to lose weight.  Consider a lower calorie diet and regular exercise.  Limit your sodium (Salt) intake    It is important that you exercise regularly, at least 20 minutes 3 to 4 times per week.  If you develop chest pain or shortness of breath seek  medical attention.  Return in 6 months for follow-up

## 2013-12-05 NOTE — Progress Notes (Signed)
Pre visit review using our clinic review tool, if applicable. No additional management support is needed unless otherwise documented below in the visit note. 

## 2013-12-06 ENCOUNTER — Telehealth: Payer: Self-pay | Admitting: Internal Medicine

## 2013-12-06 NOTE — Telephone Encounter (Signed)
emmi mailed  °

## 2013-12-12 ENCOUNTER — Other Ambulatory Visit: Payer: Self-pay | Admitting: Internal Medicine

## 2013-12-28 ENCOUNTER — Other Ambulatory Visit: Payer: Self-pay | Admitting: Internal Medicine

## 2014-01-25 ENCOUNTER — Other Ambulatory Visit: Payer: Self-pay | Admitting: Internal Medicine

## 2014-02-22 DIAGNOSIS — C50919 Malignant neoplasm of unspecified site of unspecified female breast: Secondary | ICD-10-CM

## 2014-02-22 HISTORY — DX: Malignant neoplasm of unspecified site of unspecified female breast: C50.919

## 2014-03-14 ENCOUNTER — Other Ambulatory Visit: Payer: Self-pay | Admitting: *Deleted

## 2014-03-14 MED ORDER — TIZANIDINE HCL 4 MG PO TABS: 4.0000 mg | ORAL_TABLET | Freq: Three times a day (TID) | ORAL | Status: DC | PRN

## 2014-03-14 NOTE — Telephone Encounter (Signed)
Pt notified medication change for Flexeril to Zanaflex Rx sent to pharmacy. Pt verbalized understanding.

## 2014-03-19 ENCOUNTER — Ambulatory Visit (INDEPENDENT_AMBULATORY_CARE_PROVIDER_SITE_OTHER): Payer: Medicare Other | Admitting: Internal Medicine

## 2014-03-19 ENCOUNTER — Encounter: Payer: Self-pay | Admitting: Internal Medicine

## 2014-03-19 VITALS — BP 120/70 | HR 85 | Temp 98.0°F | Resp 20 | Ht 64.0 in | Wt 265.0 lb

## 2014-03-19 DIAGNOSIS — I10 Essential (primary) hypertension: Secondary | ICD-10-CM

## 2014-03-19 DIAGNOSIS — M48061 Spinal stenosis, lumbar region without neurogenic claudication: Secondary | ICD-10-CM

## 2014-03-19 DIAGNOSIS — G5711 Meralgia paresthetica, right lower limb: Secondary | ICD-10-CM

## 2014-03-19 DIAGNOSIS — R002 Palpitations: Secondary | ICD-10-CM

## 2014-03-19 DIAGNOSIS — M4806 Spinal stenosis, lumbar region: Secondary | ICD-10-CM

## 2014-03-19 DIAGNOSIS — E119 Type 2 diabetes mellitus without complications: Secondary | ICD-10-CM

## 2014-03-19 MED ORDER — PANTOPRAZOLE SODIUM 40 MG PO TBEC: 40.0000 mg | DELAYED_RELEASE_TABLET | Freq: Every day | ORAL | Status: DC

## 2014-03-19 NOTE — Progress Notes (Signed)
Subjective:    Patient ID: Darlene Mclaughlin, female    DOB: 1950/07/22, 64 y.o.   MRN: [id number]  HPI  64 year old patient who has hypertension and diabetes.  She also has a history of lumbar spinal stenosis.  She continues to have chronic low back pain that is aggravated by activity.  The past month she has had a painful dysesthesia involving her right lateral thigh area.  For the past couple days.  She is also has some vague chest discomfort that is worse at night and improves with sitting.  She has tried some antacids with some improvement.   Past Medical History  Diagnosis Date  . ALLERGIC RHINITIS 05/15/2008  . ASTHMA 05/15/2008  . Headache(784.0) 05/15/2008  . HYPERTENSION 05/15/2008  . IMPAIRED GLUCOSE TOLERANCE 05/15/2008  . OSTEOARTHRITIS 05/15/2008  . Obesity     History   Social History  . Marital Status: Widowed    Spouse Name: N/A    Number of Children: N/A  . Years of Education: N/A   Occupational History  . Not on file.   Social History Main Topics  . Smoking status: Never Smoker   . Smokeless tobacco: Never Used  . Alcohol Use: Yes     Comment: RARELY  . Drug Use: No  . Sexual Activity: Not on file   Other Topics Concern  . Not on file   Social History Narrative    Past Surgical History  Procedure Laterality Date  . Cesarean section    . Abdominal hysterectomy    . Knee surgery      arthroscopic left    Family History  Problem Relation Age of Onset  . Diabetes Mother   . Hypertension Mother   . Cancer Mother     Breast cancer  . Hyperlipidemia Father     Allergies  Allergen Reactions  . Naproxen     Current Outpatient Prescriptions on File Prior to Visit  Medication Sig Dispense Refill  . acetaminophen (TYLENOL) 650 MG CR tablet Take 650 mg by mouth every 8 (eight) hours as needed.      Marland Kitchen aspirin 81 MG tablet Take 81 mg by mouth daily.      . chlorthalidone (HYGROTON) 25 MG tablet TAKE 1 TABLET (25 MG TOTAL) BY MOUTH DAILY. 90 tablet  1  . fluticasone (FLONASE) 50 MCG/ACT nasal spray Place 1 spray into the nose daily. 16 g 6  . glimepiride (AMARYL) 4 MG tablet TAKE 1/2 TABLET (2 MG TOTAL) BY MOUTH EVERY MORNING. 45 tablet 1  . glucose blood (FREESTYLE LITE) test strip 1 each by Other route 2 (two) times daily. Use as instructed 100 each 12  . KLOR-CON M20 20 MEQ tablet TAKE 1 TABLET DAILY. 90 tablet 1  . metFORMIN (GLUCOPHAGE-XR) 500 MG 24 hr tablet TAKE 2 TABLETS (1,000 MG TOTAL) BY MOUTH DAILY WITH SUPPER. 180 tablet 1  . Multiple Vitamin (MULTIVITAMIN) tablet Take 1 tablet by mouth daily.      . NON FORMULARY WEEKLY ALLERGY INJECTIONS     . tiZANidine (ZANAFLEX) 4 MG tablet Take 1 tablet (4 mg total) by mouth every 8 (eight) hours as needed for muscle spasms. 30 tablet 5  . traMADol (ULTRAM) 50 MG tablet TAKE 1 TABLET BY MOUTH EVERY 6 HOURS AS NEEDED FOR PAIN 50 tablet 2   No current facility-administered medications on file prior to visit.    BP 120/70 mmHg  Pulse 85  Temp(Src) 98 F (36.7 C) (Oral)  Resp 20  Ht 5\' 4"  (1.626 m)  Wt 265 lb (120.203 kg)  BMI 45.46 kg/m2  SpO2 97%      Review of Systems  Constitutional: Negative.   HENT: Negative for congestion, dental problem, hearing loss, rhinorrhea, sinus pressure, sore throat and tinnitus.   Eyes: Negative for pain, discharge and visual disturbance.  Respiratory: Negative for cough and shortness of breath.   Cardiovascular: Positive for chest pain. Negative for palpitations and leg swelling.  Gastrointestinal: Negative for nausea, vomiting, abdominal pain, diarrhea, constipation, blood in stool and abdominal distention.  Genitourinary: Negative for dysuria, urgency, frequency, hematuria, flank pain, vaginal bleeding, vaginal discharge, difficulty urinating, vaginal pain and pelvic pain.  Musculoskeletal: Positive for back pain. Negative for joint swelling, arthralgias and gait problem.  Skin: Negative for rash.  Neurological: Positive for numbness.  Negative for dizziness, syncope, speech difficulty, weakness and headaches.  Hematological: Negative for adenopathy.  Psychiatric/Behavioral: Negative for behavioral problems, dysphoric mood and agitation. The patient is not nervous/anxious.        Objective:   Physical Exam  Constitutional: She is oriented to person, place, and time. She appears well-developed and well-nourished.  Obese No distress Blood pressure 120/70  HENT:  Head: Normocephalic.  Right Ear: External ear normal.  Left Ear: External ear normal.  Mouth/Throat: Oropharynx is clear and moist.  Eyes: Conjunctivae and EOM are normal. Pupils are equal, round, and reactive to light.  Neck: Normal range of motion. Neck supple. No thyromegaly present.  Cardiovascular: Normal rate, regular rhythm, normal heart sounds and intact distal pulses.   Pulmonary/Chest: Effort normal and breath sounds normal.  Abdominal: Soft. Bowel sounds are normal. She exhibits no mass. There is no tenderness.  Musculoskeletal: Normal range of motion.  Lymphadenopathy:    She has no cervical adenopathy.  Neurological: She is alert and oriented to person, place, and time.  Numbness in the distribution of the right lateral femoral cutaneous nerve  Skin: Skin is warm and dry. No rash noted.  Psychiatric: She has a normal mood and affect. Her behavior is normal.          Assessment & Plan:    dyspepsia.  Will place on a antireflux diet and treat with short-term PPI therapy Diabetes, well controlled Hypertension, well-controlled

## 2014-03-19 NOTE — Patient Instructions (Addendum)
Avoids foods high in acid such as tomatoes citrus juices, and spicy foods.  Avoid eating within two hours of lying down or before exercising.  Do not overheat.  Try smaller more frequent meals.  If symptoms persist, elevate the head of her bed 12 inches while sleeping.  Limit your sodium (Salt) intake   Please check your hemoglobin A1c every 3 months  Food Choices for Gastroesophageal Reflux Disease When you have gastroesophageal reflux disease (GERD), the foods you eat and your eating habits are very important. Choosing the right foods can help ease your discomfort.  WHAT GUIDELINES DO I NEED TO FOLLOW?   Choose fruits, vegetables, whole grains, and low-fat dairy products.   Choose low-fat meat, fish, and poultry.  Limit fats such as oils, salad dressings, butter, nuts, and avocado.   Keep a food diary. This helps you identify foods that cause symptoms.   Avoid foods that cause symptoms. These may be different for everyone.   Eat small meals often instead of 3 large meals a day.   Eat your meals slowly, in a place where you are relaxed.   Limit fried foods.   Cook foods using methods other than frying.   Avoid drinking alcohol.   Avoid drinking large amounts of liquids with your meals.   Avoid bending over or lying down until 2-3 hours after eating.  WHAT FOODS ARE NOT RECOMMENDED?  These are some foods and drinks that may make your symptoms worse: Vegetables Tomatoes. Tomato juice. Tomato and spaghetti sauce. Chili peppers. Onion and garlic. Horseradish. Fruits Oranges, grapefruit, and lemon (fruit and juice). Meats High-fat meats, fish, and poultry. This includes hot dogs, ribs, ham, sausage, salami, and bacon. Dairy Whole milk and chocolate milk. Sour cream. Cream. Butter. Ice cream. Cream cheese.  Drinks Coffee and tea. Bubbly (carbonated) drinks or energy drinks. Condiments Hot sauce. Barbecue sauce.  Sweets/Desserts Chocolate and cocoa. Donuts.  Peppermint and spearmint. Fats and Oils High-fat foods. This includes [place] fries and potato chips. Other Vinegar. Strong spices. This includes black pepper, white pepper, red pepper, cayenne, curry powder, cloves, ginger, and chili powder. The items listed above may not be a complete list of foods and drinks to avoid. Contact your dietitian for more information. Document Released: 08/10/2011 Document Revised: 02/13/2013 Document Reviewed: 12/13/2012 [facility] Patient Information 2015 [place], [place]. This information is not intended to replace advice given to you by your health care provider. Make sure you discuss any questions you have with your health care provider.

## 2014-04-17 ENCOUNTER — Other Ambulatory Visit: Payer: Self-pay | Admitting: *Deleted

## 2014-04-17 MED ORDER — PANTOPRAZOLE SODIUM 40 MG PO TBEC: 40.0000 mg | DELAYED_RELEASE_TABLET | Freq: Every day | ORAL | Status: DC

## 2014-04-18 ENCOUNTER — Telehealth: Payer: Self-pay | Admitting: *Deleted

## 2014-04-18 NOTE — Telephone Encounter (Signed)
Rx sent yesterday

## 2014-04-18 NOTE — Telephone Encounter (Signed)
pantoprazole (PROTONIX) 40 MG tablet  Medication   Date: 04/17/2014  Department: [facility]: Marletta Lor, MD      Order Providers    Prescribing Provider Encounter Provider   Marletta Lor, MD Marian Sorrow, LPN    Medication Detail      Disp Refills Start End     pantoprazole (PROTONIX) 40 MG tablet 90 tablet 3 04/17/2014     Sig - Route: Take 1 tablet (40 mg total) by mouth daily. - Oral    E-Prescribing Status: Receipt confirmed by pharmacy (04/17/2014 12:51 PM EST)     Pharmacy    [id number] - [place], [place] Encounter   Priority and Order Details

## 2014-05-18 ENCOUNTER — Other Ambulatory Visit: Payer: Self-pay | Admitting: Internal Medicine

## 2014-06-05 ENCOUNTER — Other Ambulatory Visit: Payer: Medicare Other

## 2014-06-06 ENCOUNTER — Other Ambulatory Visit: Payer: Self-pay | Admitting: Internal Medicine

## 2014-06-12 ENCOUNTER — Encounter: Payer: Medicare Other | Admitting: Internal Medicine

## 2014-06-13 ENCOUNTER — Encounter: Payer: Self-pay | Admitting: Family Medicine

## 2014-06-13 ENCOUNTER — Ambulatory Visit (INDEPENDENT_AMBULATORY_CARE_PROVIDER_SITE_OTHER): Payer: Medicare Other | Admitting: Family Medicine

## 2014-06-13 VITALS — BP 124/70 | HR 120 | Temp 98.1°F | Ht 64.0 in | Wt 261.5 lb

## 2014-06-13 DIAGNOSIS — R300 Dysuria: Secondary | ICD-10-CM | POA: Diagnosis not present

## 2014-06-13 LAB — POCT URINALYSIS DIPSTICK
Blood, UA: NEGATIVE
Glucose, UA: NEGATIVE
Ketones, UA: NEGATIVE
Leukocytes, UA: NEGATIVE
Nitrite, UA: NEGATIVE
Protein, UA: NEGATIVE
Spec Grav, UA: 1.015
Urobilinogen, UA: >=8
pH, UA: 7

## 2014-06-13 LAB — URINALYSIS, MICROSCOPIC ONLY: RBC / HPF: NONE SEEN (ref 0–?)

## 2014-06-13 NOTE — Progress Notes (Signed)
Pre visit review using our clinic review tool, if applicable. No additional management support is needed unless otherwise documented below in the visit note. 

## 2014-06-13 NOTE — Progress Notes (Signed)
HPI:  Darlene Mclaughlin is a pleasant 64 yo patient of Dr. Burnice Logan here for an acute visit for:  Dysuria: -started 3 days ago -symptoms: urinary frequency and urgency, odor, thought had blood in urine when wiped a few days ago (this was after a large hard stool and she has hemorrhoids and sometimes has bleeding with these so she is not sure if this was in the urine or not) -denies: fevers, nausea, flank pain, abd pain, vaginal discharge, diarrhea -took some motrin the week prior to these symptoms for a few days for tooth pain -she reports a hx of UTI  ROS: See pertinent positives and negatives per HPI.  Past Medical History  Diagnosis Date  . ALLERGIC RHINITIS 05/15/2008  . ASTHMA 05/15/2008  . Headache(784.0) 05/15/2008  . HYPERTENSION 05/15/2008  . IMPAIRED GLUCOSE TOLERANCE 05/15/2008  . OSTEOARTHRITIS 05/15/2008  . Obesity     Past Surgical History  Procedure Laterality Date  . Cesarean section    . Abdominal hysterectomy    . Knee surgery      arthroscopic left    Family History  Problem Relation Age of Onset  . Diabetes Mother   . Hypertension Mother   . Cancer Mother     Breast cancer  . Hyperlipidemia Father     History   Social History  . Marital Status: Widowed    Spouse Name: N/A  . Number of Children: N/A  . Years of Education: N/A   Social History Main Topics  . Smoking status: Never Smoker   . Smokeless tobacco: Never Used  . Alcohol Use: Yes     Comment: RARELY  . Drug Use: No  . Sexual Activity: Not on file   Other Topics Concern  . None   Social History Narrative     Current outpatient prescriptions:  .  acetaminophen (TYLENOL) 650 MG CR tablet, Take 650 mg by mouth every 8 (eight) hours as needed.  , Disp: , Rfl:  .  aspirin 81 MG tablet, Take 81 mg by mouth daily.  , Disp: , Rfl:  .  chlorthalidone (HYGROTON) 25 MG tablet, TAKE 1 TABLET (25 MG TOTAL) BY MOUTH DAILY., Disp: 90 tablet, Rfl: 1 .  fluticasone (FLONASE) 50 MCG/ACT nasal  spray, Place 1 spray into the nose daily., Disp: 16 g, Rfl: 6 .  glimepiride (AMARYL) 4 MG tablet, TAKE 1/2 TABLET (2 MG TOTAL) BY MOUTH EVERY MORNING., Disp: 45 tablet, Rfl: 1 .  glucose blood (FREESTYLE LITE) test strip, 1 each by Other route 2 (two) times daily. Use as instructed, Disp: 100 each, Rfl: 12 .  KLOR-CON M20 20 MEQ tablet, TAKE 1 TABLET DAILY., Disp: 90 tablet, Rfl: 1 .  metFORMIN (GLUCOPHAGE-XR) 500 MG 24 hr tablet, TAKE 2 TABLETS (1,000 MG TOTAL) BY MOUTH DAILY WITH SUPPER., Disp: 180 tablet, Rfl: 4 .  Multiple Vitamin (MULTIVITAMIN) tablet, Take 1 tablet by mouth daily.  , Disp: , Rfl:  .  NON FORMULARY, WEEKLY ALLERGY INJECTIONS , Disp: , Rfl:  .  pantoprazole (PROTONIX) 40 MG tablet, Take 1 tablet (40 mg total) by mouth daily., Disp: 90 tablet, Rfl: 3 .  tiZANidine (ZANAFLEX) 4 MG tablet, Take 1 tablet (4 mg total) by mouth every 8 (eight) hours as needed for muscle spasms., Disp: 30 tablet, Rfl: 5 .  traMADol (ULTRAM) 50 MG tablet, TAKE 1 TABLET BY MOUTH EVERY 6 HOURS AS NEEDED FOR PAIN, Disp: 50 tablet, Rfl: 2  EXAM:  Filed Vitals:   06/13/14  1254  BP: 124/70  Pulse: 120  Temp: 98.1 F (36.7 C)    Body mass index is 44.86 kg/(m^2).  GENERAL: vitals reviewed and listed above, alert, oriented, appears well hydrated and in no acute distress  HEENT: atraumatic, conjunttiva clear, no obvious abnormalities on inspection of external nose and ears  NECK: no obvious masses on inspection  LUNGS: clear to auscultation bilaterally, no wheezes, rales or rhonchi, good air movement  CV: HRRR, no peripheral edema  ABD: soft, NTTP, no CVA TTP  MS: moves all extremities without noticeable abnormality  PSYCH: pleasant and cooperative, no obvious depression or anxiety  ASSESSMENT AND PLAN:  Discussed the following assessment and plan:  Dysuria  -udip pending,micro, cult pedning tx accordingly -offered eval of her hemorrhoids, but she reports this is a chronic problem  that Dr. Raliegh Ip is aware of and she has a physical scheduled in 1 month -advised if urine studies not revealing and symptoms persist to recheck at follow up, or rtc if new or worsening concerns   There are no Patient Instructions on file for this visit.   Colin Benton R.

## 2014-06-15 ENCOUNTER — Other Ambulatory Visit: Payer: Self-pay | Admitting: Family Medicine

## 2014-06-15 LAB — URINE CULTURE: Colony Count: >=100000

## 2014-06-18 MED ORDER — NITROFURANTOIN MONOHYD MACRO 100 MG PO CAPS: 100.0000 mg | ORAL_CAPSULE | Freq: Two times a day (BID) | ORAL | Status: DC

## 2014-06-18 NOTE — Addendum Note (Signed)
Addended by: Agnes Lawrence on: 06/18/2014 10:42 AM   Modules accepted: Orders

## 2014-06-21 ENCOUNTER — Encounter: Payer: Medicare Other | Admitting: Internal Medicine

## 2014-06-26 ENCOUNTER — Other Ambulatory Visit: Payer: Self-pay | Admitting: Internal Medicine

## 2014-07-10 ENCOUNTER — Other Ambulatory Visit (INDEPENDENT_AMBULATORY_CARE_PROVIDER_SITE_OTHER): Payer: Medicare Other

## 2014-07-10 DIAGNOSIS — Z0000 Encounter for general adult medical examination without abnormal findings: Secondary | ICD-10-CM | POA: Diagnosis not present

## 2014-07-10 DIAGNOSIS — I10 Essential (primary) hypertension: Secondary | ICD-10-CM

## 2014-07-10 LAB — POCT URINALYSIS DIPSTICK
Bilirubin, UA: NEGATIVE
Blood, UA: NEGATIVE
Glucose, UA: NEGATIVE
Ketones, UA: NEGATIVE
Leukocytes, UA: NEGATIVE
Nitrite, UA: NEGATIVE
Protein, UA: NEGATIVE
Spec Grav, UA: 1.015
Urobilinogen, UA: 4
pH, UA: 7.5

## 2014-07-10 LAB — CBC WITH DIFFERENTIAL/PLATELET
Basophils Absolute: 0 10*3/uL (ref 0.0–0.1)
Basophils Relative: 0.4 % (ref 0.0–3.0)
Eosinophils Absolute: 0.1 10*3/uL (ref 0.0–0.7)
Eosinophils Relative: 2.2 % (ref 0.0–5.0)
HCT: 39.7 % (ref 36.0–46.0)
Hemoglobin: 13.5 g/dL (ref 12.0–15.0)
Lymphocytes Relative: 29.2 % (ref 12.0–46.0)
Lymphs Abs: 1.9 10*3/uL (ref 0.7–4.0)
MCHC: 33.9 g/dL (ref 30.0–36.0)
MCV: 96.4 fl (ref 78.0–100.0)
Monocytes Absolute: 0.8 10*3/uL (ref 0.1–1.0)
Monocytes Relative: 11.8 % (ref 3.0–12.0)
Neutro Abs: 3.7 10*3/uL (ref 1.4–7.7)
Neutrophils Relative %: 56.4 % (ref 43.0–77.0)
Platelets: 133 10*3/uL — ABNORMAL LOW (ref 150.0–400.0)
RBC: 4.12 Mil/uL (ref 3.87–5.11)
RDW: 20.9 % — ABNORMAL HIGH (ref 11.5–15.5)
WBC: 6.6 10*3/uL (ref 4.0–10.5)

## 2014-07-10 LAB — MICROALBUMIN / CREATININE URINE RATIO
Creatinine,U: 52.5 mg/dL
Microalb Creat Ratio: 1.3 mg/g (ref 0.0–30.0)
Microalb, Ur: <0.7 mg/dL (ref 0.0–1.9)

## 2014-07-10 LAB — LIPID PANEL
Cholesterol: 166 mg/dL (ref 0–200)
HDL: 33.2 mg/dL — ABNORMAL LOW (ref >39.00)
LDL Cholesterol: 115 mg/dL — ABNORMAL HIGH (ref 0–99)
NonHDL: 132.8
Total CHOL/HDL Ratio: 5
Triglycerides: 91 mg/dL (ref 0.0–149.0)
VLDL: 18.2 mg/dL (ref 0.0–40.0)

## 2014-07-10 LAB — COMPREHENSIVE METABOLIC PANEL
ALT: 229 U/L — ABNORMAL HIGH (ref 0–35)
AST: 207 U/L — ABNORMAL HIGH (ref 0–37)
Albumin: 2.9 g/dL — ABNORMAL LOW (ref 3.5–5.2)
Alkaline Phosphatase: 95 U/L (ref 39–117)
BUN: 9 mg/dL (ref 6–23)
CO2: 30 mEq/L (ref 19–32)
Calcium: 9.2 mg/dL (ref 8.4–10.5)
Chloride: 102 mEq/L (ref 96–112)
Creatinine, Ser: 0.67 mg/dL (ref 0.40–1.20)
GFR: 113.33 mL/min (ref >60.00)
Glucose, Bld: 62 mg/dL — ABNORMAL LOW (ref 70–99)
Potassium: 3.4 mEq/L — ABNORMAL LOW (ref 3.5–5.1)
Sodium: 139 mEq/L (ref 135–145)
Total Bilirubin: 3.5 mg/dL — ABNORMAL HIGH (ref 0.2–1.2)
Total Protein: 7.2 g/dL (ref 6.0–8.3)

## 2014-07-10 LAB — TSH: TSH: 1.88 u[IU]/mL (ref 0.35–4.50)

## 2014-07-10 LAB — HEMOGLOBIN A1C: Hgb A1c MFr Bld: 5.2 % (ref 4.6–6.5)

## 2014-07-11 ENCOUNTER — Other Ambulatory Visit: Payer: Self-pay | Admitting: Internal Medicine

## 2014-07-11 DIAGNOSIS — R748 Abnormal levels of other serum enzymes: Secondary | ICD-10-CM

## 2014-07-16 ENCOUNTER — Other Ambulatory Visit: Payer: Self-pay | Admitting: Internal Medicine

## 2014-07-16 ENCOUNTER — Telehealth: Payer: Self-pay | Admitting: *Deleted

## 2014-07-16 ENCOUNTER — Encounter: Payer: Self-pay | Admitting: Internal Medicine

## 2014-07-16 ENCOUNTER — Ambulatory Visit (INDEPENDENT_AMBULATORY_CARE_PROVIDER_SITE_OTHER): Payer: Medicare Other | Admitting: Internal Medicine

## 2014-07-16 ENCOUNTER — Other Ambulatory Visit: Payer: Self-pay | Admitting: *Deleted

## 2014-07-16 ENCOUNTER — Encounter: Payer: Self-pay | Admitting: *Deleted

## 2014-07-16 VITALS — BP 124/70 | HR 106 | Temp 98.0°F | Resp 20 | Ht 64.0 in | Wt 263.0 lb

## 2014-07-16 DIAGNOSIS — R7989 Other specified abnormal findings of blood chemistry: Secondary | ICD-10-CM

## 2014-07-16 DIAGNOSIS — M48061 Spinal stenosis, lumbar region without neurogenic claudication: Secondary | ICD-10-CM

## 2014-07-16 DIAGNOSIS — R945 Abnormal results of liver function studies: Secondary | ICD-10-CM

## 2014-07-16 DIAGNOSIS — I10 Essential (primary) hypertension: Secondary | ICD-10-CM | POA: Diagnosis not present

## 2014-07-16 DIAGNOSIS — R17 Unspecified jaundice: Secondary | ICD-10-CM

## 2014-07-16 DIAGNOSIS — R748 Abnormal levels of other serum enzymes: Secondary | ICD-10-CM

## 2014-07-16 DIAGNOSIS — Z0000 Encounter for general adult medical examination without abnormal findings: Secondary | ICD-10-CM | POA: Diagnosis not present

## 2014-07-16 DIAGNOSIS — E119 Type 2 diabetes mellitus without complications: Secondary | ICD-10-CM

## 2014-07-16 DIAGNOSIS — J3089 Other allergic rhinitis: Secondary | ICD-10-CM

## 2014-07-16 LAB — COMPREHENSIVE METABOLIC PANEL
ALT: 225 U/L — ABNORMAL HIGH (ref 0–35)
AST: 219 U/L — ABNORMAL HIGH (ref 0–37)
Albumin: 3.1 g/dL — ABNORMAL LOW (ref 3.5–5.2)
Alkaline Phosphatase: 93 U/L (ref 39–117)
BUN: 17 mg/dL (ref 6–23)
CO2: 29 mEq/L (ref 19–32)
Calcium: 9.4 mg/dL (ref 8.4–10.5)
Chloride: 102 mEq/L (ref 96–112)
Creatinine, Ser: 0.73 mg/dL (ref 0.40–1.20)
GFR: 102.64 mL/min (ref >60.00)
Glucose, Bld: 98 mg/dL (ref 70–99)
Potassium: 3.2 mEq/L — ABNORMAL LOW (ref 3.5–5.1)
Sodium: 140 mEq/L (ref 135–145)
Total Bilirubin: 3.9 mg/dL — ABNORMAL HIGH (ref 0.2–1.2)
Total Protein: 7.6 g/dL (ref 6.0–8.3)

## 2014-07-16 MED ORDER — TIZANIDINE HCL 4 MG PO TABS: 4.0000 mg | ORAL_TABLET | Freq: Three times a day (TID) | ORAL | Status: DC | PRN

## 2014-07-16 MED ORDER — TRAMADOL HCL 50 MG PO TABS: 50.0000 mg | ORAL_TABLET | Freq: Four times a day (QID) | ORAL | Status: DC | PRN

## 2014-07-16 MED ORDER — GLIMEPIRIDE 4 MG PO TABS: ORAL_TABLET | ORAL | Status: DC

## 2014-07-16 NOTE — Telephone Encounter (Signed)
Called [facility] and spoke to [place], told her Dr. Raliegh Ip said to cancel Abd Ultrasound and I just put in orders for STAT CT ABD/PELVIS W/WO Contrast, Dx: Jaundice and elevated Liver enzymes. Roberta verbalized understanding and stated can do tomorrow at 4:00pm at [facility] [address]. Have pt pickup contrast tomorrow morning and we will give her further instructions. Told her okay, thanks.

## 2014-07-16 NOTE — Progress Notes (Signed)
Subjective:    Patient ID: Darlene Mclaughlin, female    DOB: [birth year], 64 y.o.   MRN: [id number]  HPI 64 year old patient who is seen today for a preventive health examination Screening laboratory data was reviewed and revealed elevated liver function studies.  She is scheduled for an abdominal ultrasound in 2 days.  She feels poorly with anorexia and weakness.  Symptoms are improved compared to last month when she also had some abdominal discomfort  Glimepiride was discontinued due to mild hypoglycemia and poor oral intake.  Hemoglobin A1c was in a nondiabetic range.  She remains on metformin therapy for type 2 diabetes  She has a long history of lumbar spinal stenosis.  Lumbar MRI was reviewed from 2007.  The patient walks with a cane and continues to have back pain and some leg weakness  Past Medical History  Diagnosis Date  . ALLERGIC RHINITIS 05/15/2008  . ASTHMA 05/15/2008  . Headache(784.0) 05/15/2008  . HYPERTENSION 05/15/2008  . IMPAIRED GLUCOSE TOLERANCE 05/15/2008  . OSTEOARTHRITIS 05/15/2008  . Obesity     History   Social History  . Marital Status: Widowed    Spouse Name: N/A  . Number of Children: N/A  . Years of Education: N/A   Occupational History  . Not on file.   Social History Main Topics  . Smoking status: Never Smoker   . Smokeless tobacco: Never Used  . Alcohol Use: Yes     Comment: RARELY  . Drug Use: No  . Sexual Activity: Not on file   Other Topics Concern  . Not on file   Social History Narrative    Past Surgical History  Procedure Laterality Date  . Cesarean section    . Abdominal hysterectomy    . Knee surgery      arthroscopic left    Family History  Problem Relation Age of Onset  . Diabetes Mother   . Hypertension Mother   . Cancer Mother     Breast cancer  . Hyperlipidemia Father     Allergies  Allergen Reactions  . Naproxen     Current Outpatient Prescriptions on File Prior to Visit  Medication Sig Dispense Refill  .  acetaminophen (TYLENOL) 650 MG CR tablet Take 650 mg by mouth every 8 (eight) hours as needed.      Marland Kitchen aspirin 81 MG tablet Take 81 mg by mouth daily.      . chlorthalidone (HYGROTON) 25 MG tablet TAKE 1 TABLET (25 MG TOTAL) BY MOUTH DAILY. 90 tablet 1  . fluticasone (FLONASE) 50 MCG/ACT nasal spray Place 1 spray into the nose daily. 16 g 6  . glucose blood (FREESTYLE LITE) test strip 1 each by Other route 2 (two) times daily. Use as instructed 100 each 12  . KLOR-CON M20 20 MEQ tablet TAKE 1 TABLET DAILY. 90 tablet 1  . metFORMIN (GLUCOPHAGE-XR) 500 MG 24 hr tablet TAKE 2 TABLETS (1,000 MG TOTAL) BY MOUTH DAILY WITH SUPPER. 180 tablet 4  . Multiple Vitamin (MULTIVITAMIN) tablet Take 1 tablet by mouth daily.      . NON FORMULARY WEEKLY ALLERGY INJECTIONS     . pantoprazole (PROTONIX) 40 MG tablet Take 1 tablet (40 mg total) by mouth daily. 90 tablet 3   No current facility-administered medications on file prior to visit.    BP 124/70 mmHg  Pulse 106  Temp(Src) 98 F (36.7 C) (Oral)  Resp 20  Ht 5\' 4"  (1.626 m)  Wt 263 lb (119.296 kg)  BMI 45.12 kg/m2  SpO2 98%   1. Risk factors, based on past  M,S,F history.  Cardiovascular risk factors include hypertension, type 2 diabetes  2.  Physical activities: Walks with a cane fairly sedentary.  Complains of unsteady gait  3.  Depression/mood: No history of major depression or mood disorder  4.  Hearing: No deficits  5.  ADL's: Independent  6.  Fall risk: Moderate to high.  Walks with a cane  7.  Home safety: No problems identified  8.  Height weight, and visual acuity; height and weight stable no change in visual acuity  9.  Counseling: Heart healthy diet.  Annual eye examination.  All encouraged  10. Lab orders based on risk factors: Laboratory studies reviewed including hemoglobin A1c  11. Referral : Neurosurgical referral for evaluation of spinal stenosis  12. Care plan: Heart healthy diet, weight loss.  More activity.  All  encouraged.  Will follow-up the liver function studies as well as an abdominal ultrasound.  May need ERCP  13. Cognitive assessment: Alert in order with normal affect.  No cognitive dysfunction  14. Screening: Annual eye examinations recommended.  The patient will continue to have colonoscopies at 10 year intervals.  Will consider bone density studies every 2 or 3 years  15. Provider List Update: Includes primary care medicine, neurosurgery radiology and ophthalmology     Review of Systems  Constitutional: Positive for activity change, appetite change and fatigue.  HENT: Negative for congestion, dental problem, hearing loss, rhinorrhea, sinus pressure, sore throat and tinnitus.   Eyes: Negative for pain, discharge and visual disturbance.  Respiratory: Negative for cough and shortness of breath.   Cardiovascular: Negative for chest pain, palpitations and leg swelling.  Gastrointestinal: Positive for abdominal pain. Negative for nausea, vomiting, diarrhea, constipation, blood in stool and abdominal distention.  Genitourinary: Negative for dysuria, urgency, frequency, hematuria, flank pain, vaginal bleeding, vaginal discharge, difficulty urinating, vaginal pain and pelvic pain.  Musculoskeletal: Negative for joint swelling, arthralgias and gait problem.  Skin: Negative for rash.  Neurological: Positive for weakness. Negative for dizziness, syncope, speech difficulty, numbness and headaches.  Hematological: Negative for adenopathy.  Psychiatric/Behavioral: Negative for behavioral problems, dysphoric mood and agitation. The patient is not nervous/anxious.        Objective:   Physical Exam  Constitutional: She is oriented to person, place, and time. She appears well-developed and well-nourished.  Obese No distress Blood pressure well controlled  HENT:  Head: Normocephalic and atraumatic.  Right Ear: External ear normal.  Left Ear: External ear normal.  Mouth/Throat: Oropharynx is clear  and moist.  Eyes: Conjunctivae and EOM are normal.  Neck: Normal range of motion. Neck supple. No JVD present. No thyromegaly present.  Cardiovascular: Normal rate, regular rhythm, normal heart sounds and intact distal pulses.   No murmur heard. Pulmonary/Chest: Effort normal and breath sounds normal. She has no wheezes. She has no rales.  Abdominal: Soft. Bowel sounds are normal. She exhibits no distension and no mass. There is no tenderness. There is no rebound and no guarding.  Obese, soft and nontender.  No organomegaly Lower abdominal surgical scars  Genitourinary: Vagina normal.  Musculoskeletal: Normal range of motion. She exhibits no edema or tenderness.  Neurological: She is alert and oriented to person, place, and time. She has normal reflexes. No cranial nerve deficit. She exhibits normal muscle tone. Coordination normal.  Skin: Skin is warm and dry. No rash noted.  Psychiatric: She has a normal mood and affect. Her behavior  is normal.          Assessment & Plan:   Preventive health examination Elevated liver function studies.  Will repeat today in follow-up abdominal ultrasound as scheduled.  May need ERCP Diabetes, stable Essential hypertension, well-controlled Lumbar spinal stenosis.  Patient has persistent back pain and leg discomfort.  Will repeat follow-up.  Lumbar MRI and refer to neurosurgery  Recheck 2 weeks Will call if she develops any new or worsening symptoms

## 2014-07-16 NOTE — Telephone Encounter (Signed)
Spoke to pt told her Dr.K has cancelled the Ultrasound that was scheduled and now is ordering a CT Abd/Pelvis W/WO Contrast and is scheduled for tomorrow at 4:00pm address is  [address]. Pt verbalized understanding. Told pt need to go there tomorrow morning to pickup contrast and they will give you further instructions prior to procedure at 4:00 pm. Pt verbalized understanding.

## 2014-07-16 NOTE — Progress Notes (Signed)
Pre visit review using our clinic review tool, if applicable. No additional management support is needed unless otherwise documented below in the visit note. 

## 2014-07-16 NOTE — Patient Instructions (Signed)
Lumbar MRI Neurosurgical consultation Abdominal ultrasound as scheduled  .

## 2014-07-17 ENCOUNTER — Ambulatory Visit
Admission: RE | Admit: 2014-07-17 | Discharge: 2014-07-17 | Disposition: A | Discharge status: Home / Self Care | Payer: Medicare Other | Source: Ambulatory Visit | Attending: Internal Medicine | Admitting: Internal Medicine

## 2014-07-17 ENCOUNTER — Other Ambulatory Visit: Payer: Self-pay | Admitting: Internal Medicine

## 2014-07-17 ENCOUNTER — Telehealth: Payer: Self-pay | Admitting: Internal Medicine

## 2014-07-17 ENCOUNTER — Inpatient Hospital Stay: Admission: RE | Admit: 2014-07-17 | Payer: Medicare Other | Source: Ambulatory Visit

## 2014-07-17 DIAGNOSIS — R17 Unspecified jaundice: Secondary | ICD-10-CM

## 2014-07-17 DIAGNOSIS — R748 Abnormal levels of other serum enzymes: Secondary | ICD-10-CM

## 2014-07-17 MED ORDER — IOPAMIDOL (ISOVUE-300) INJECTION 61%
125.0000 mL | Freq: Once | INTRAVENOUS | Status: AC | PRN
  Administered 2014-07-17: 125 mL via INTRAVENOUS

## 2014-07-17 NOTE — Telephone Encounter (Signed)
Spoke to Karns at [place] and put new order in for CT ABD W Contrast due to insurance. Dorian Pod verbalized understanding.

## 2014-07-17 NOTE — Telephone Encounter (Signed)
PT IS SCHEDULED FOR A CT ABDOMEN PELVIS W CONTRAST. The only way insurance would approve this is it was CT Abdomen w/o &w/contrast  . So [facility]  ( [id number]) is asking if you can can enter a A new order for CT abdomen

## 2014-07-18 ENCOUNTER — Telehealth: Payer: Self-pay | Admitting: Internal Medicine

## 2014-07-18 ENCOUNTER — Other Ambulatory Visit: Payer: Medicare Other

## 2014-07-18 DIAGNOSIS — R17 Unspecified jaundice: Secondary | ICD-10-CM

## 2014-07-18 DIAGNOSIS — R748 Abnormal levels of other serum enzymes: Secondary | ICD-10-CM

## 2014-07-18 NOTE — Telephone Encounter (Signed)
Pt called back, told her okay to  resume Metformin in 48 hours per Dr.K. Pt verbalized understanding.

## 2014-07-18 NOTE — Telephone Encounter (Signed)
Pt had ct scan yesterday and per instructions do not take metformin for 48 hrs and to contact your doctor. Pt would like donna to return her call to see when she can resume metformin

## 2014-07-18 NOTE — Telephone Encounter (Signed)
Left message on voicemail to call office on home and mobile.  

## 2014-07-19 ENCOUNTER — Telehealth: Payer: Self-pay

## 2014-07-19 ENCOUNTER — Ambulatory Visit
Admission: RE | Admit: 2014-07-19 | Discharge: 2014-07-19 | Disposition: A | Discharge status: Home / Self Care | Payer: Medicare Other | Source: Ambulatory Visit | Attending: Internal Medicine | Admitting: Internal Medicine

## 2014-07-19 DIAGNOSIS — R945 Abnormal results of liver function studies: Principal | ICD-10-CM

## 2014-07-19 DIAGNOSIS — M48061 Spinal stenosis, lumbar region without neurogenic claudication: Secondary | ICD-10-CM

## 2014-07-19 DIAGNOSIS — R7989 Other specified abnormal findings of blood chemistry: Secondary | ICD-10-CM

## 2014-07-19 NOTE — Telephone Encounter (Signed)
Pt has been notified of appt and labs, she was given the address and phone number

## 2014-07-19 NOTE — Telephone Encounter (Signed)
I did speak with Dr. Raliegh Ip about her earlier this week. OK to double book for next Friday. I have office hours Monday but am double booked twice already that day.  Thanks         I'd like her to get the following labs; Hepatitis A (IgM and IgG), Hepatitis B surface antigen, Hepatitis B surface antibody, Hepatitis C antibody, total iron, ferritin, TIBC, ANA, AMA, alphafeto protein (AFP), anti smooth muscle antibody, TTG, total IgA level, CBC, CMET, INR.        thanks            ----- Message -----     From: Barron Alvine, CMA     Sent: 07/18/2014  3:22 PM      To: Milus Banister, MD        Dr Ardis Hughs do you want this pt double booked?        ----- Message -----     From: Marvel Plan     Sent: 07/18/2014  3:15 PM      To: Barron Alvine, CMA        Please notes from this referral - Looks like Dr. Burnice Logan and Dr. Ardis Hughs have spoke; not sure what they had in mind in terms of scheduling. Advise please.

## 2014-07-21 ENCOUNTER — Other Ambulatory Visit: Payer: Self-pay | Admitting: Internal Medicine

## 2014-07-23 ENCOUNTER — Other Ambulatory Visit (INDEPENDENT_AMBULATORY_CARE_PROVIDER_SITE_OTHER): Payer: Medicare Other

## 2014-07-23 DIAGNOSIS — Z5181 Encounter for therapeutic drug level monitoring: Secondary | ICD-10-CM | POA: Diagnosis not present

## 2014-07-23 DIAGNOSIS — R945 Abnormal results of liver function studies: Principal | ICD-10-CM

## 2014-07-23 DIAGNOSIS — R7989 Other specified abnormal findings of blood chemistry: Secondary | ICD-10-CM

## 2014-07-23 LAB — CBC WITH DIFFERENTIAL/PLATELET
Basophils Absolute: 0 10*3/uL (ref 0.0–0.1)
Basophils Relative: 0.3 % (ref 0.0–3.0)
Eosinophils Absolute: 0.1 10*3/uL (ref 0.0–0.7)
Eosinophils Relative: 2.4 % (ref 0.0–5.0)
HCT: 43.1 % (ref 36.0–46.0)
Hemoglobin: 14.8 g/dL (ref 12.0–15.0)
Lymphocytes Relative: 27.7 % (ref 12.0–46.0)
Lymphs Abs: 1.5 10*3/uL (ref 0.7–4.0)
MCHC: 34.3 g/dL (ref 30.0–36.0)
MCV: 97.6 fl (ref 78.0–100.0)
Monocytes Absolute: 0.5 10*3/uL (ref 0.1–1.0)
Monocytes Relative: 10.1 % (ref 3.0–12.0)
Neutro Abs: 3.2 10*3/uL (ref 1.4–7.7)
Neutrophils Relative %: 59.5 % (ref 43.0–77.0)
Platelets: 152 10*3/uL (ref 150.0–400.0)
RBC: 4.42 Mil/uL (ref 3.87–5.11)
RDW: 19 % — ABNORMAL HIGH (ref 11.5–15.5)
WBC: 5.3 10*3/uL (ref 4.0–10.5)

## 2014-07-23 LAB — COMPREHENSIVE METABOLIC PANEL
ALT: 213 U/L — ABNORMAL HIGH (ref 0–35)
AST: 187 U/L — ABNORMAL HIGH (ref 0–37)
Albumin: 3.1 g/dL — ABNORMAL LOW (ref 3.5–5.2)
Alkaline Phosphatase: 86 U/L (ref 39–117)
BUN: 11 mg/dL (ref 6–23)
CO2: 31 mEq/L (ref 19–32)
Calcium: 9.3 mg/dL (ref 8.4–10.5)
Chloride: 100 mEq/L (ref 96–112)
Creatinine, Ser: 0.76 mg/dL (ref 0.40–1.20)
GFR: 97.98 mL/min (ref >60.00)
Glucose, Bld: 127 mg/dL — ABNORMAL HIGH (ref 70–99)
Potassium: 3.2 mEq/L — ABNORMAL LOW (ref 3.5–5.1)
Sodium: 139 mEq/L (ref 135–145)
Total Bilirubin: 2.7 mg/dL — ABNORMAL HIGH (ref 0.2–1.2)
Total Protein: 7.7 g/dL (ref 6.0–8.3)

## 2014-07-23 LAB — FERRITIN: Ferritin: 781.2 ng/mL — ABNORMAL HIGH (ref 10.0–291.0)

## 2014-07-23 LAB — PROTIME-INR
INR: 1.3 ratio — ABNORMAL HIGH (ref 0.8–1.0)
Prothrombin Time: 14.2 s — ABNORMAL HIGH (ref 9.6–13.1)

## 2014-07-23 LAB — IGA: IgA: 826 mg/dL — ABNORMAL HIGH (ref 68–378)

## 2014-07-24 LAB — HEPATITIS B SURFACE ANTIBODY,QUALITATIVE: Hep B S Ab: NEGATIVE

## 2014-07-24 LAB — HEPATITIS A ANTIBODY, TOTAL: Hep A Total Ab: REACTIVE — AB

## 2014-07-24 LAB — HEPATITIS B SURFACE ANTIGEN: Hepatitis B Surface Ag: NEGATIVE

## 2014-07-24 LAB — HEPATITIS B CORE ANTIBODY, TOTAL: Hep B Core Total Ab: NONREACTIVE

## 2014-07-24 LAB — TISSUE TRANSGLUTAMINASE, IGA: Tissue Transglutaminase Ab, IgA: 1 U/mL (ref <4)

## 2014-07-24 LAB — AFP TUMOR MARKER: AFP-Tumor Marker: 19.4 ng/mL — ABNORMAL HIGH (ref <6.1)

## 2014-07-24 LAB — HEPATITIS C ANTIBODY: HCV Ab: NEGATIVE

## 2014-07-24 LAB — ANA: Anti Nuclear Antibody(ANA): NEGATIVE

## 2014-07-25 LAB — MITOCHONDRIAL ANTIBODIES: Mitochondrial M2 Ab, IgG: 0.61 (ref <0.91)

## 2014-07-26 ENCOUNTER — Other Ambulatory Visit (INDEPENDENT_AMBULATORY_CARE_PROVIDER_SITE_OTHER): Payer: Medicare Other

## 2014-07-26 ENCOUNTER — Encounter: Payer: Self-pay | Admitting: Gastroenterology

## 2014-07-26 ENCOUNTER — Ambulatory Visit (INDEPENDENT_AMBULATORY_CARE_PROVIDER_SITE_OTHER): Payer: Medicare Other | Admitting: Gastroenterology

## 2014-07-26 VITALS — BP 128/76 | HR 80 | Ht 64.0 in | Wt 263.0 lb

## 2014-07-26 DIAGNOSIS — R748 Abnormal levels of other serum enzymes: Secondary | ICD-10-CM | POA: Diagnosis not present

## 2014-07-26 LAB — ANTI-SMOOTH MUSCLE ANTIBODY, IGG: Smooth Muscle Ab: 49 U — ABNORMAL HIGH (ref <20)

## 2014-07-26 LAB — IBC PANEL
Iron: 172 ug/dL — ABNORMAL HIGH (ref 42–145)
Saturation Ratios: 65.3 % — ABNORMAL HIGH (ref 20.0–50.0)
Transferrin: 188 mg/dL — ABNORMAL LOW (ref 212.0–360.0)

## 2014-07-26 NOTE — Patient Instructions (Addendum)
You will have labs checked today in the basement lab.  Please head down after you check out with the front desk  (hepatitis A Ab IgG, hepatitis A Ab IgM, total iron, TIBC) macrobid started April 2016, can cause hepatitis, cholestasis (jaundice), hepatic necrosis... You should not resume this zanaflex started 02/2014 #1 most common, serious reaction is hepatotoxicity.  You should stop this MRI abd with MRCP images (?dilated CBD, elevated liver tests)  You have been scheduled for an MRI at [facility] on  08/06/14. Your appointment time is 4 pm. Please arrive 15 minutes prior to your appointment time for registration purposes. Please make certain not to have anything to eat or drink 6 hours prior to your test. In addition, if you have any metal in your body, have a pacemaker or defibrillator, please be sure to let your ordering physician know. This test typically takes 45 minutes to 1 hour to complete. Obesity can cause liver problems that can even progress to cirrhosis, liver failure, you should try to lose weight.

## 2014-07-26 NOTE — Progress Notes (Signed)
HPI: This is a very pleasant 63 year old woman    who was referred to me by Marletta Lor, MD  to evaluate  Elevated liver tests    Chief complaint is elevated liver tests  Never had liver disease; never hepatitis.  Her brother had pancreatic cancer, died.  Never big etoh.  Overall weight has been stable.  No signficant abd pains.  No jaudice.  CT scan with IV and PO contrast 06/2014: 1. Mild to moderate degradation, secondary to patient body habitus.2. Possible cholelithiasis, without acute cholecystitis.3. No biliary ductal dilatation or other explanation for jaundice.4. Two indeterminate left renal lesions. Most likely complex cysts. If the patient can undergo pre and post contrast abdominal MRI, this is suggested. If not, ultrasound surveillance could be performed toconfirm ongoing size stability.5. Possible constipation  Labs 06/2014: T bili 2.7, AST 180-200, ALT 200-230. (LFTs 2012-2105 all normal) Platelets normal.. Ferritin 781. Alpha-fetoprotein 19.4. INR 1.3, ANA negative, anti-smooth muscle antibody pending, hepatitis A total antibody reactive, hepatitis B surface antigen negative, hepatitis B surface antibody negative, hepatitis B core antibody negative, hepatitis C antibody negative, celiac sprue test negative, AMA negative  macrobid started April 2016, can cause hepatitis, cholestasis (jaundice), hepatic necrosis (never took this before) zanaflex started 02/2014 #1 most common, serious reaction is hepatotoxicity  (stopped 2 weeks ago, took at night)  Stopped due to hallucinations, which resolved after she stopped.   Review of systems: Pertinent positive and negative review of systems were noted in the above HPI section. Complete review of systems was performed and was otherwise normal.   Past Medical History  Diagnosis Date  . ALLERGIC RHINITIS 05/15/2008  . ASTHMA 05/15/2008  . Headache(784.0) 05/15/2008  . HYPERTENSION 05/15/2008  . IMPAIRED GLUCOSE TOLERANCE  05/15/2008  . OSTEOARTHRITIS 05/15/2008  . Obesity     Past Surgical History  Procedure Laterality Date  . Cesarean section    . Abdominal hysterectomy    . Knee surgery      arthroscopic left    Current Outpatient Prescriptions  Medication Sig Dispense Refill  . acetaminophen (TYLENOL) 650 MG CR tablet Take 650 mg by mouth every 8 (eight) hours as needed.      Marland Kitchen aspirin 81 MG tablet Take 81 mg by mouth daily.      . chlorthalidone (HYGROTON) 25 MG tablet TAKE 1 TABLET (25 MG TOTAL) BY MOUTH DAILY. 90 tablet 1  . EPIPEN 2-PAK 0.3 MG/0.3ML SOAJ injection 0.3 mg.   1  . fluticasone (FLONASE) 50 MCG/ACT nasal spray Place 1 spray into the nose daily. 16 g 6  . glucose blood (FREESTYLE LITE) test strip 1 each by Other route 2 (two) times daily. Use as instructed 100 each 12  . KLOR-CON M20 20 MEQ tablet TAKE 1 TABLET DAILY. 90 tablet 1  . metFORMIN (GLUCOPHAGE-XR) 500 MG 24 hr tablet TAKE 2 TABLETS (1,000 MG TOTAL) BY MOUTH DAILY WITH SUPPER. 180 tablet 4  . Multiple Vitamin (MULTIVITAMIN) tablet Take 1 tablet by mouth daily.      . NON FORMULARY WEEKLY ALLERGY INJECTIONS     . pantoprazole (PROTONIX) 40 MG tablet Take 1 tablet (40 mg total) by mouth daily. 90 tablet 3  . PROAIR HFA 108 (90 BASE) MCG/ACT inhaler Inhale 1 puff into the lungs every 6 (six) hours as needed.   0  . tiZANidine (ZANAFLEX) 4 MG tablet Take 1 tablet (4 mg total) by mouth every 8 (eight) hours as needed for muscle spasms. 30 tablet 5  .  traMADol (ULTRAM) 50 MG tablet Take 1 tablet (50 mg total) by mouth every 6 (six) hours as needed. for pain 50 tablet 3   No current facility-administered medications for this visit.    Allergies as of 07/26/2014 - Review Complete 07/26/2014  Allergen Reaction Noted  . Naproxen      Family History  Problem Relation Age of Onset  . Diabetes Mother   . Hypertension Mother   . Cancer Mother     Breast cancer  . Hyperlipidemia Father     History   Social History  .  Marital Status: Widowed    Spouse Name: N/A  . Number of Children: N/A  . Years of Education: N/A   Occupational History  . Retired    Social History Main Topics  . Smoking status: Never Smoker   . Smokeless tobacco: Never Used  . Alcohol Use: Yes     Comment: RARELY  . Drug Use: No  . Sexual Activity: Not on file   Other Topics Concern  . Not on file   Social History Narrative     Physical Exam: Ht 5\' 4"  (1.626 m)  Wt 263 lb (119.296 kg)  BMI 45.12 kg/m2 Constitutional: generally well-appearing Psychiatric: alert and oriented x3 Eyes: extraocular movements intact Mouth: oral pharynx moist, no lesions Neck: supple no lymphadenopathy Cardiovascular: heart regular rate and rhythm Lungs: clear to auscultation bilaterally Abdomen: soft, nontender, nondistended, no obvious ascites, no peritoneal signs, normal bowel sounds Extremities: no lower extremity edema bilaterally Skin: no lesions on visible extremities   Assessment and plan: 64 y.o. female with  elevated liver tests, unclear etiology  2 of her recently started medicines both can cause liver toxicity. Zanaflex and Macrobid. She artery stopped Zanaflex for some hallucination side effects. The Macrobid was the first time she never taken it and she is artery stopped. We are adding these to her allergy list and discontinuing them from her medicine list now. She is morbidly obese and this can cause fatty liver, and even progress to cirrhosis and liver failure. Her liver tests are a bit out of range for usual fatty liver disease but perhaps that is contributing. I recommended she try to lose weight which she has artery been doing her common bile duct was on the high side of normal diameter and given her family history of pancreatic cancer I would like to repeat imaging, this time with MRI, MRCP images to rule out structural, anatomic causes of her liver irritation.   Owens Loffler, MD [facility] 07/26/2014, 1:13  PM  Cc: Marletta Lor, MD

## 2014-07-27 LAB — HEPATITIS A ANTIBODY, TOTAL: Hep A Total Ab: REACTIVE — AB

## 2014-07-30 ENCOUNTER — Ambulatory Visit (INDEPENDENT_AMBULATORY_CARE_PROVIDER_SITE_OTHER): Payer: Medicare Other | Admitting: Internal Medicine

## 2014-07-30 ENCOUNTER — Other Ambulatory Visit: Payer: Self-pay

## 2014-07-30 ENCOUNTER — Other Ambulatory Visit: Payer: Self-pay | Admitting: *Deleted

## 2014-07-30 ENCOUNTER — Encounter: Payer: Self-pay | Admitting: Internal Medicine

## 2014-07-30 VITALS — BP 118/70 | HR 81 | Temp 98.0°F | Resp 20 | Ht 64.0 in | Wt 263.0 lb

## 2014-07-30 DIAGNOSIS — R7989 Other specified abnormal findings of blood chemistry: Secondary | ICD-10-CM

## 2014-07-30 DIAGNOSIS — I10 Essential (primary) hypertension: Secondary | ICD-10-CM

## 2014-07-30 DIAGNOSIS — E119 Type 2 diabetes mellitus without complications: Secondary | ICD-10-CM | POA: Diagnosis not present

## 2014-07-30 DIAGNOSIS — R945 Abnormal results of liver function studies: Secondary | ICD-10-CM

## 2014-07-30 MED ORDER — FLUTICASONE PROPIONATE 50 MCG/ACT NA SUSP: 1.0000 | Freq: Every day | NASAL | Status: AC

## 2014-07-30 NOTE — Patient Instructions (Signed)
Limit your sodium (Salt) intake    It is important that you exercise regularly, at least 20 minutes 3 to 4 times per week.  If you develop chest pain or shortness of breath seek  medical attention.  You need to lose weight.  Consider a lower calorie diet and regular exercise.  Return in 6 months for follow-up   

## 2014-07-30 NOTE — Progress Notes (Signed)
Subjective:    Patient ID: Darlene Mclaughlin, female    DOB: 07/05/1950, 64 y.o.   MRN: [id number]  HPI  Lab Results  Component Value Date   HGBA1C 5.2 07/10/2014   64 year old patient who has been seen recently by GI due to elevated LFTs.  She is scheduled for a MRCP later this month.  She continues to feel well without fatigue, pruritus, anorexia or other constitutional complaints She has hypertension which has been well controlled She has type 2 diabetes.  Past Medical History  Diagnosis Date  . ALLERGIC RHINITIS 05/15/2008  . ASTHMA 05/15/2008  . Headache(784.0) 05/15/2008  . HYPERTENSION 05/15/2008  . IMPAIRED GLUCOSE TOLERANCE 05/15/2008  . OSTEOARTHRITIS 05/15/2008  . Obesity     History   Social History  . Marital Status: Widowed    Spouse Name: N/A  . Number of Children: N/A  . Years of Education: N/A   Occupational History  . Retired    Social History Main Topics  . Smoking status: Never Smoker   . Smokeless tobacco: Never Used  . Alcohol Use: Yes     Comment: RARELY  . Drug Use: No  . Sexual Activity: Not on file   Other Topics Concern  . Not on file   Social History Narrative    Past Surgical History  Procedure Laterality Date  . Cesarean section    . Abdominal hysterectomy    . Knee surgery      arthroscopic left    Family History  Problem Relation Age of Onset  . Diabetes Mother   . Hypertension Mother   . Cancer Mother     Breast cancer  . Hyperlipidemia Father     Allergies  Allergen Reactions  . Macrobid [Nitrofurantoin Monohyd Macro]      Liver problems  . Naproxen   . Zanaflex [Tizanidine Hcl] Other (See Comments)     Liver problems    Current Outpatient Prescriptions on File Prior to Visit  Medication Sig Dispense Refill  . acetaminophen (TYLENOL) 650 MG CR tablet Take 650 mg by mouth every 8 (eight) hours as needed.      Marland Kitchen aspirin 81 MG tablet Take 81 mg by mouth daily.      . chlorthalidone (HYGROTON) 25 MG tablet TAKE 1  TABLET (25 MG TOTAL) BY MOUTH DAILY. 90 tablet 1  . EPIPEN 2-PAK 0.3 MG/0.3ML SOAJ injection 0.3 mg.   1  . fluticasone (FLONASE) 50 MCG/ACT nasal spray Place 1 spray into the nose daily. 16 g 6  . glucose blood (FREESTYLE LITE) test strip 1 each by Other route 2 (two) times daily. Use as instructed 100 each 12  . KLOR-CON M20 20 MEQ tablet TAKE 1 TABLET DAILY. 90 tablet 1  . metFORMIN (GLUCOPHAGE-XR) 500 MG 24 hr tablet TAKE 2 TABLETS (1,000 MG TOTAL) BY MOUTH DAILY WITH SUPPER. 180 tablet 4  . Multiple Vitamin (MULTIVITAMIN) tablet Take 1 tablet by mouth daily.      . NON FORMULARY WEEKLY ALLERGY INJECTIONS     . pantoprazole (PROTONIX) 40 MG tablet Take 1 tablet (40 mg total) by mouth daily. 90 tablet 3  . PROAIR HFA 108 (90 BASE) MCG/ACT inhaler Inhale 1 puff into the lungs every 6 (six) hours as needed.   0  . traMADol (ULTRAM) 50 MG tablet Take 1 tablet (50 mg total) by mouth every 6 (six) hours as needed. for pain 50 tablet 3   No current facility-administered medications on file prior to  visit.    BP 118/70 mmHg  Pulse 81  Temp(Src) 98 F (36.7 C) (Oral)  Resp 20  Ht 5\' 4"  (1.626 m)  Wt 263 lb (119.296 kg)  BMI 45.12 kg/m2  SpO2 98%    Review of Systems  Constitutional: Negative.   HENT: Negative for congestion, dental problem, hearing loss, rhinorrhea, sinus pressure, sore throat and tinnitus.   Eyes: Negative for pain, discharge and visual disturbance.  Respiratory: Negative for cough and shortness of breath.   Cardiovascular: Negative for chest pain, palpitations and leg swelling.  Gastrointestinal: Negative for nausea, vomiting, abdominal pain, diarrhea, constipation, blood in stool and abdominal distention.  Genitourinary: Negative for dysuria, urgency, frequency, hematuria, flank pain, vaginal bleeding, vaginal discharge, difficulty urinating, vaginal pain and pelvic pain.  Musculoskeletal: Negative for joint swelling, arthralgias and gait problem.  Skin: Negative  for rash.  Neurological: Negative for dizziness, syncope, speech difficulty, weakness, numbness and headaches.  Hematological: Negative for adenopathy.  Psychiatric/Behavioral: Negative for behavioral problems, dysphoric mood and agitation. The patient is not nervous/anxious.        Objective:   Physical Exam  Constitutional: She is oriented to person, place, and time. She appears well-developed and well-nourished.  Obese No distress Blood pressure low normal  HENT:  Head: Normocephalic.  Right Ear: External ear normal.  Left Ear: External ear normal.  Mouth/Throat: Oropharynx is clear and moist.  Anicteric  Eyes: Conjunctivae and EOM are normal. Pupils are equal, round, and reactive to light.  Neck: Normal range of motion. Neck supple. No thyromegaly present.  Cardiovascular: Normal rate, regular rhythm, normal heart sounds and intact distal pulses.   Pulmonary/Chest: Effort normal and breath sounds normal.  Abdominal: Soft. Bowel sounds are normal. She exhibits no mass. There is no tenderness.  Musculoskeletal: Normal range of motion.  Lymphadenopathy:    She has no cervical adenopathy.  Neurological: She is alert and oriented to person, place, and time.  Skin: Skin is warm and dry. No rash noted.  Psychiatric: She has a normal mood and affect. Her behavior is normal.          Assessment & Plan:   Elevated LFTs.  Workup in progress.  Possible fatty liver or drug toxicity Hypertension, stable Diabetes mellitus stable  Recheck 6 months MRCP pending

## 2014-07-30 NOTE — Progress Notes (Signed)
Pre visit review using our clinic review tool, if applicable. No additional management support is needed unless otherwise documented below in the visit note. 

## 2014-08-04 LAB — HM DIABETES EYE EXAM

## 2014-08-05 ENCOUNTER — Telehealth: Payer: Self-pay

## 2014-08-05 ENCOUNTER — Other Ambulatory Visit (INDEPENDENT_AMBULATORY_CARE_PROVIDER_SITE_OTHER): Payer: Medicare Other

## 2014-08-05 DIAGNOSIS — R945 Abnormal results of liver function studies: Principal | ICD-10-CM

## 2014-08-05 DIAGNOSIS — R7989 Other specified abnormal findings of blood chemistry: Secondary | ICD-10-CM | POA: Diagnosis not present

## 2014-08-05 LAB — HEPATIC FUNCTION PANEL
ALT: 70 U/L — ABNORMAL HIGH (ref 0–35)
AST: 60 U/L — ABNORMAL HIGH (ref 0–37)
Albumin: 3 g/dL — ABNORMAL LOW (ref 3.5–5.2)
Alkaline Phosphatase: 77 U/L (ref 39–117)
Bilirubin, Direct: 0.8 mg/dL — ABNORMAL HIGH (ref 0.0–0.3)
Total Bilirubin: 2.2 mg/dL — ABNORMAL HIGH (ref 0.2–1.2)
Total Protein: 7.1 g/dL (ref 6.0–8.3)

## 2014-08-05 NOTE — Telephone Encounter (Signed)
-----   Message from Barron Alvine, [place] sent at 07/30/2014  4:32 PM EDT ----- Pt to get labs see 07/30/14 note

## 2014-08-05 NOTE — Telephone Encounter (Signed)
Pt had labs drawn today

## 2014-08-06 ENCOUNTER — Ambulatory Visit (HOSPITAL_COMMUNITY)
Admission: RE | Admit: 2014-08-06 | Discharge: 2014-08-06 | Disposition: A | Discharge status: Home / Self Care | Payer: Medicare Other | Source: Ambulatory Visit | Attending: Gastroenterology | Admitting: Gastroenterology

## 2014-08-06 ENCOUNTER — Encounter: Payer: Self-pay | Admitting: Internal Medicine

## 2014-08-06 ENCOUNTER — Other Ambulatory Visit: Payer: Self-pay | Admitting: Gastroenterology

## 2014-08-06 ENCOUNTER — Other Ambulatory Visit: Payer: Self-pay

## 2014-08-06 DIAGNOSIS — R748 Abnormal levels of other serum enzymes: Secondary | ICD-10-CM

## 2014-08-06 DIAGNOSIS — R7989 Other specified abnormal findings of blood chemistry: Secondary | ICD-10-CM | POA: Diagnosis not present

## 2014-08-06 DIAGNOSIS — N289 Disorder of kidney and ureter, unspecified: Secondary | ICD-10-CM | POA: Diagnosis not present

## 2014-08-06 DIAGNOSIS — E669 Obesity, unspecified: Secondary | ICD-10-CM | POA: Diagnosis not present

## 2014-08-06 DIAGNOSIS — K8020 Calculus of gallbladder without cholecystitis without obstruction: Secondary | ICD-10-CM | POA: Insufficient documentation

## 2014-08-06 MED ORDER — GADOBENATE DIMEGLUMINE 529 MG/ML IV SOLN
20.0000 mL | Freq: Once | INTRAVENOUS | Status: AC | PRN
  Administered 2014-08-06: 19 mL via INTRAVENOUS

## 2014-09-03 ENCOUNTER — Telehealth: Payer: Self-pay

## 2014-09-03 NOTE — Telephone Encounter (Signed)
Letter mailed to remind pt to have labs 

## 2014-09-03 NOTE — Telephone Encounter (Signed)
-----   Message from [place] sent at 08/06/2014  9:48 AM EDT ----- Pt to get labs see 08/06/14 results note

## 2014-09-05 ENCOUNTER — Other Ambulatory Visit: Payer: Self-pay

## 2014-09-05 ENCOUNTER — Other Ambulatory Visit (INDEPENDENT_AMBULATORY_CARE_PROVIDER_SITE_OTHER): Payer: Medicare Other

## 2014-09-05 DIAGNOSIS — R7989 Other specified abnormal findings of blood chemistry: Secondary | ICD-10-CM

## 2014-09-05 DIAGNOSIS — R748 Abnormal levels of other serum enzymes: Secondary | ICD-10-CM | POA: Diagnosis not present

## 2014-09-05 DIAGNOSIS — R945 Abnormal results of liver function studies: Principal | ICD-10-CM

## 2014-09-05 LAB — HEPATIC FUNCTION PANEL
ALT: 47 U/L — ABNORMAL HIGH (ref 0–35)
AST: 37 U/L (ref 0–37)
Albumin: 3.3 g/dL — ABNORMAL LOW (ref 3.5–5.2)
Alkaline Phosphatase: 68 U/L (ref 39–117)
Bilirubin, Direct: 0.3 mg/dL (ref 0.0–0.3)
Total Bilirubin: 1.1 mg/dL (ref 0.2–1.2)
Total Protein: 7.3 g/dL (ref 6.0–8.3)

## 2014-09-05 LAB — FERRITIN: Ferritin: 408.4 ng/mL — ABNORMAL HIGH (ref 10.0–291.0)

## 2014-10-25 ENCOUNTER — Other Ambulatory Visit: Payer: Self-pay | Admitting: Internal Medicine

## 2014-10-25 DIAGNOSIS — Z1231 Encounter for screening mammogram for malignant neoplasm of breast: Secondary | ICD-10-CM

## 2014-11-05 ENCOUNTER — Ambulatory Visit (HOSPITAL_COMMUNITY)
Admission: RE | Admit: 2014-11-05 | Discharge: 2014-11-05 | Disposition: A | Discharge status: Home / Self Care | Payer: Medicare Other | Source: Ambulatory Visit | Attending: Internal Medicine | Admitting: Internal Medicine

## 2014-11-05 DIAGNOSIS — Z1231 Encounter for screening mammogram for malignant neoplasm of breast: Secondary | ICD-10-CM | POA: Insufficient documentation

## 2014-11-07 ENCOUNTER — Other Ambulatory Visit: Payer: Self-pay | Admitting: Internal Medicine

## 2014-11-07 DIAGNOSIS — R928 Other abnormal and inconclusive findings on diagnostic imaging of breast: Secondary | ICD-10-CM

## 2014-11-12 ENCOUNTER — Ambulatory Visit
Admission: RE | Admit: 2014-11-12 | Discharge: 2014-11-12 | Disposition: A | Discharge status: Home / Self Care | Payer: Medicare Other | Source: Ambulatory Visit | Attending: Internal Medicine | Admitting: Internal Medicine

## 2014-11-12 ENCOUNTER — Other Ambulatory Visit: Payer: Self-pay | Admitting: Internal Medicine

## 2014-11-12 DIAGNOSIS — R928 Other abnormal and inconclusive findings on diagnostic imaging of breast: Secondary | ICD-10-CM

## 2014-11-15 ENCOUNTER — Ambulatory Visit
Admission: RE | Admit: 2014-11-15 | Discharge: 2014-11-15 | Disposition: A | Discharge status: Home / Self Care | Payer: Medicare Other | Source: Ambulatory Visit | Attending: Internal Medicine | Admitting: Internal Medicine

## 2014-11-15 ENCOUNTER — Other Ambulatory Visit: Payer: Self-pay | Admitting: Internal Medicine

## 2014-11-15 DIAGNOSIS — R928 Other abnormal and inconclusive findings on diagnostic imaging of breast: Secondary | ICD-10-CM

## 2014-11-22 ENCOUNTER — Ambulatory Visit: Payer: Self-pay | Admitting: Surgery

## 2014-11-22 DIAGNOSIS — D0511 Intraductal carcinoma in situ of right breast: Secondary | ICD-10-CM

## 2014-11-22 NOTE — H&P (Signed)
Darlene Mclaughlin 11/22/2014 9:52 AM Location: [facility] Patient #: [id number] DOB: 05/03/1950 Widowed / Language: [name] / Race: Black or African American Female History of Present Illness Darlene Moores A. Cornett MD; 11/22/2014 12:24 PM) Patient words: right breast DCIS  Pt sent at the request of Dr Darlene Mclaughlin for abnormal microcalcifications on her mammogram on th right. Core bx showed DCIS. Cluster appears small after viewing on mammogram. Area is lower to outer quadrant. Pt denies mass, discharge or pain.        CLINICAL DATA: Patient returns today to evaluate right breast calcifications identified on a recent screening mammogram.  EXAM: DIGITAL DIAGNOSTIC RIGHT MAMMOGRAM  COMPARISON: Previous exams including recent screening mammogram dated 11/05/2014.  ACR Breast Density Category b: There are scattered areas of fibroglandular density.  FINDINGS: On today's additional views with magnification, grouped punctate and amorphous calcifications are confirmed within the lower outer quadrant of the right breast, 6-7 o'clock axis, at middle to posterior depth.  IMPRESSION: Grouped punctate and amorphous calcifications confirmed within the lower outer quadrant of the right breast, 6-7 o'clock axis region, for which stereotactic biopsy is recommended.           ADDITIONAL INFORMATION: PROGNOSTIC INDICATORS Results: IMMUNOHISTOCHEMICAL AND MORPHOMETRIC ANALYSIS PERFORMED MANUALLY Estrogen Receptor: 100%, POSITIVE, STRONG STAINING INTENSITY Progesterone Receptor: 90%, POSITIVE, STRONG STAINING INTENSITY REFERENCE RANGE ESTROGEN RECEPTOR NEGATIVE 0% POSITIVE =>1% REFERENCE RANGE PROGESTERONE RECEPTOR NEGATIVE 0% POSITIVE =>1% All controls stained appropriately Darlene Cutter MD Pathologist, Electronic Signature ( Signed 11/19/2014) FINAL DIAGNOSIS Diagnosis Breast, right, needle core biopsy, lower outer - DUCTAL CARCINOMA IN SITU WITH CALCIFICATIONS. -  FIBROADENOMA. - SEE COMMENT. 1 of 2 FINAL for Darlene Mclaughlin [phone]) Microscopic Comment The carcinoma appears intermediate grade. Estrogen receptor and progesterone receptor studies will be performed and the results reported separately. (JBK:gt, 11/18/14) Darlene Cutter MD Pathologist, Electronic Signature (Case signed 11/18/2014) Specimen Gross and Clinical Information Specimen Comment In formalin 9:35; right breast calcs Specimen(s) Obtained: Breast, right, needle core biopsy, lower outer Specimen Clinical Information FCC/fibroadenoma vs. DCIS Gross TIF is 9:35 a.m. on 11/15/14, the CIT is not provided. Received in formalin within a coretainer is a 2.8 x 2.8 x 1.4 cm aggregate of soft, yellow white fibrofatty tissue. The specimen is entirely submitted in three blocks. (TB:kh 11-15-14) Stain(s) used in Diagnosis: The following stain(s) were used in diagnosing the case: PR-ACIS, ER-ACIS. The control(s) stained appropriately. Disclaimer Estrogen receptor (SP1), immunohistochemical stains are performed on formalin fixed, paraffin embedded tissue using a 3,3"-diaminobenzidine (DAB) chromogen and DAKO Autostainer System. The staining intensity of the nucleus is scored morphometrically using the Automated Cellular Imaging System (ACIS) and is reported as the percentage of tumor cell nuclei demonstrating specific nuclear staining. PR progesterone receptor (PgR 636), immunohistochemical stains are performed on formalin fixed, paraffin embedded tissue using a 3,3"-diaminobenzidine (DAB) chromogen and DAKO Autostainer System. The staining intensity of the nucleus is scored morphometrically using the Automated Cellular Imaging System (ACIS) and is reported as the percentage of tumor cell nuclei demonstrating specific nuclear staining. Report signed out from the following location(s) Technical Component was performed at [facility]. [address],[place] [address].[id number]., Technical component and interpretation was performed at [facility] [address], [place], [place] [address]. CLIA #: [id number], 2 of.  The patient is a 64 year old female   Other Problems Darlene Mclaughlin, CMA; 11/22/2014 9:52 AM) Arthritis Asthma Back Pain Breast Cancer Diabetes Mellitus Hemorrhoids High blood pressure Kidney Stone Lump In Breast  Past Surgical History Darlene Mclaughlin, CMA; 11/22/2014 9:52 AM) Breast Biopsy Right. Cesarean Section - Multiple Hysterectomy (due to cancer) - Partial Knee Surgery Left. Sentinel Lymph Node Biopsy  Diagnostic Studies History Darlene Mclaughlin, CMA; 11/22/2014 9:52 AM) Colonoscopy 5-10 years ago Mammogram 1-3 years ago  Allergies Darlene Mclaughlin, CMA; 11/22/2014 9:54 AM) Macrobid *URINARY ANTI-INFECTIVES* Naproxen *ANALGESICS - ANTI-INFLAMMATORY* Zanaflex *MUSCULOSKELETAL THERAPY AGENTS*  Medication History (Darlene Mclaughlin, CMA; 11/22/2014 9:55 AM) Chlorthalidone (25MG  Tablet, Oral) Active. Fluticasone Propionate (50MCG/ACT Suspension, Nasal) Active. [place]-Con M20 [facility] Tablet ER, Oral) Active. MetFORMIN HCl ER (500MG  Tablet ER 24HR, Oral) Active. ProAir HFA (108 (90 Base)MCG/ACT Aerosol Soln, Inhalation) Active. TiZANidine HCl (4MG  Tablet, Oral) Active. Pantoprazole Sodium (40MG  Tablet DR, Oral) Active. TraMADol HCl (50MG  Tablet, Oral as needed) Active. EpiPen 2-Pak (0.3MG /0.3ML Soln Auto-inj, Injection) Active. Medications Reconciled  Social History Darlene Mclaughlin, CMA; 11/22/2014 9:52 AM) Alcohol use Occasional alcohol use. Caffeine use Carbonated beverages, Coffee, Tea. No drug use Tobacco use Never smoker.  Family History Darlene Mclaughlin, [place]; 11/22/2014 9:52 AM) Alcohol Abuse Brother. Arthritis Mother. Breast Cancer Mother. Depression Brother. Diabetes Mellitus Brother, Mother. Heart Disease Brother. Hypertension Brother,  Mother. Malignant Neoplasm Of Pancreas Brother.  Pregnancy / Birth History Darlene Mclaughlin, [place]; 11/22/2014 9:52 AM) Age at menarche 31 years. Gravida 2 Maternal age 64-25 Para 2 Regular periods     Review of Systems ([place]; 11/22/2014 9:52 AM) General Present- Fatigue. Not Present- Appetite Loss, Chills, Fever, Night Sweats, Weight Gain and Weight Loss. Skin Present- Dryness. Not Present- Change in Wart/Mole, Hives, Jaundice, New Lesions, Non-Healing Wounds, Rash and Ulcer. HEENT Present- Seasonal Allergies and Wears glasses/contact lenses. Not Present- Earache, Hearing Loss, Hoarseness, Nose Bleed, Oral Ulcers, Ringing in the Ears, Sinus Pain, Sore Throat, Visual Disturbances and Yellow Eyes. Respiratory Not Present- Bloody sputum, Chronic Cough, Difficulty Breathing, Snoring and Wheezing. Breast Not Present- Breast Mass, Breast Pain, Nipple Discharge and Skin Changes. Cardiovascular Present- Leg Cramps, Palpitations and Swelling of Extremities. Not Present- Chest Pain, Difficulty Breathing Lying Down, Rapid Heart Rate and Shortness of Breath. Gastrointestinal Present- Bloating, Constipation, Excessive gas, Hemorrhoids and Indigestion. Not Present- Abdominal Pain, Bloody Stool, Change in Bowel Habits, Chronic diarrhea, Difficulty Swallowing, Gets full quickly at meals, Nausea, Rectal Pain and Vomiting. Female Genitourinary Present- Nocturia and Urgency. Not Present- Frequency, Painful Urination and Pelvic Pain. Musculoskeletal Present- Back Pain, Joint Pain, Joint Stiffness, Muscle Pain, Muscle Weakness and Swelling of Extremities. Neurological Present- Numbness, Trouble walking and Weakness. Not Present- Decreased Memory, Fainting, Headaches, Seizures, Tingling and Tremor. Psychiatric Not Present- Anxiety, Bipolar, Change in Sleep Pattern, Depression, Fearful and Frequent crying. Endocrine Present- Cold Intolerance and New Diabetes. Not Present- Excessive Hunger, Hair Changes,  Heat Intolerance and Hot flashes. Hematology Not Present- Easy Bruising, Excessive bleeding, Gland problems, HIV and Persistent Infections.  Vitals (Darlene Mclaughlin CMA; 11/22/2014 9:53 AM) 11/22/2014 9:53 AM Weight: 261 lb Height: 65in Body Surface Area: 2.33 m Body Mass Index: 43.43 kg/m Temp.: 97.77F(Temporal)  Pulse: 77 (Regular)  BP: 138/78 (Sitting, Left Arm, Standard)     Physical Exam (Thomas A. Cornett MD; 11/22/2014 12:22 PM)  General Mental Status-Alert. General Appearance-Consistent with stated age. Hydration-Well hydrated. Voice-Normal.  Head and Neck Head-normocephalic, atraumatic with no lesions or palpable masses. Trachea-midline. Thyroid Gland Characteristics - normal size and consistency.  Eye Eyeball - Bilateral-Extraocular movements intact. Sclera/Conjunctiva - Bilateral-No scleral icterus.  Chest and Lung Exam Chest and lung exam reveals -quiet, even and easy respiratory effort with no use of accessory muscles and on  auscultation, normal breath sounds, no adventitious sounds and normal vocal resonance. Inspection Chest Wall - Normal. Back - normal.  Breast Breast - Left-Symmetric, Non Tender, No Biopsy scars, no Dimpling, No Inflammation, No Lumpectomy scars, No Mastectomy scars, No Peau d' Orange. Breast - Right-Symmetric, Non Tender, No Biopsy scars, no Dimpling, No Inflammation, No Lumpectomy scars, No Mastectomy scars, No Peau d' Orange. Breast Lump-No Palpable Breast Mass.  Cardiovascular Cardiovascular examination reveals -normal heart sounds, regular rate and rhythm with no murmurs and normal pedal pulses bilaterally.  Abdomen Inspection Inspection of the abdomen reveals - No Hernias. Skin - Scar - no surgical scars. Palpation/Percussion Palpation and Percussion of the abdomen reveal - Soft, Non Tender, No Rebound tenderness, No Rigidity (guarding) and No hepatosplenomegaly. Auscultation Auscultation of  the abdomen reveals - Bowel sounds normal.  Neurologic Neurologic evaluation reveals -alert and oriented x 3 with no impairment of recent or remote memory. Mental Status-Normal.  Musculoskeletal Normal Exam - Left-Upper Extremity Strength Normal and Lower Extremity Strength Normal. Normal Exam - Right-Upper Extremity Strength Normal and Lower Extremity Strength Normal.  Lymphatic Head & Neck  General Head & Neck Lymphatics: Bilateral - Description - Normal. Axillary  General Axillary Region: Bilateral - Description - Normal. Tenderness - Non Tender. Femoral & Inguinal  Generalized Femoral & Inguinal Lymphatics: Bilateral - Description - Normal. Tenderness - Non Tender.    Assessment & Plan (Thomas A. Cornett MD; 11/22/2014 12:24 PM)  BREAST NEOPLASM, TIS (DCIS), RIGHT (D05.11) Impression: discussed mastectomy with reconstruction vs patial mastectomy discussed other treatments and the need for more surgery she would like to conserve her breast at this point she has opted for right breast seed localized lumpectomy she understands she may need radiation treatment or may need more surgery Risk of lumpectomy include bleeding, infection, seroma, more surgery, use of seed/wire, wound care, cosmetic deformity and the need for other treatments, death , blood clots, death. Pt agrees to proceed.  Current Plans Pt Education - [facility] Breast Cancer Information Given - [facility] "Breast Journey" Package Pt Education - [facility] Breast Biopsy HCI: discussed with patient and provided information. Pt Education - Patient education: Ductal carcinoma in situ (DCIS) (The Basics): discussed with patient and provided information.   The anatomy and the physiology was discussed. The pathophysiology and natural history of the disease was discussed. Options were discussed and recommendations were made. Technique, risks, benefits, & alternatives were discussed. Risks such as stroke, heart attack, bleeding,  indection, death, and other risks discussed. Questions answered. The patient agrees to proceed.

## 2014-11-25 ENCOUNTER — Telehealth: Payer: Self-pay | Admitting: *Deleted

## 2014-11-25 NOTE — Telephone Encounter (Signed)
Received referral from [facility].  Called pt and confirmed 11/26/14 appt w/ her.  Unable to mail packet - gave verbal, directions, instructions and placed a note for an intake form to be given to the pt at time of check in.  Emailed Shavon at [facility] to make her aware.  Emailed [place] and [place] for tracking.  Placed a copy of the records in Dr. Ernestina Penna box and took one to HIM to scan.

## 2014-11-26 ENCOUNTER — Encounter: Payer: Self-pay | Admitting: Hematology

## 2014-11-26 ENCOUNTER — Other Ambulatory Visit: Payer: Self-pay | Admitting: Surgery

## 2014-11-26 ENCOUNTER — Telehealth: Payer: Self-pay | Admitting: Hematology

## 2014-11-26 ENCOUNTER — Ambulatory Visit (HOSPITAL_BASED_OUTPATIENT_CLINIC_OR_DEPARTMENT_OTHER): Payer: Medicare Other | Admitting: Hematology

## 2014-11-26 VITALS — BP 161/53 | HR 77 | Temp 98.4°F | Resp 18 | Ht 65.0 in | Wt 262.3 lb

## 2014-11-26 DIAGNOSIS — Z170 Estrogen receptor positive status [ER+]: Secondary | ICD-10-CM

## 2014-11-26 DIAGNOSIS — D0511 Intraductal carcinoma in situ of right breast: Secondary | ICD-10-CM | POA: Insufficient documentation

## 2014-11-26 DIAGNOSIS — Z23 Encounter for immunization: Secondary | ICD-10-CM | POA: Diagnosis not present

## 2014-11-26 DIAGNOSIS — C50511 Malignant neoplasm of lower-outer quadrant of right female breast: Secondary | ICD-10-CM

## 2014-11-26 MED ORDER — INFLUENZA VAC SPLIT QUAD 0.5 ML IM SUSY
0.5000 mL | PREFILLED_SYRINGE | Freq: Once | INTRAMUSCULAR | Status: AC
  Administered 2014-11-26: 0.5 mL via INTRAMUSCULAR
  Filled 2014-11-26: qty 0.5

## 2014-11-26 NOTE — Progress Notes (Signed)
[facility] NOTE  Patient Care Team: Marletta Lor, MD as PCP - General  CHIEF COMPLAINTS/PURPOSE OF CONSULTATION:  Right breast DCIS   HISTORY OF PRESENTING ILLNESS:  Darlene Mclaughlin 64 y.o. female is here because of recent diagnosis of right breast DCIS.   The cancer was detected by screening mammogram. She does mammogram every year. The cancer was not palpable prior to diagnosis. She denies any other new symptoms.  She has chronic back pain, and had epidural injection twice which helped a lot. She also has chronic arthritis, especially knees and feet, she takes tramadol for both back and knee pain, she takes 0-1tab/day.  She denies any other symptoms. She has good appetite and eats well. She has good energy level. She is widowed, lives alone and very independent. She has 2 sons and 3 grandchildren, and she sees him often.   SUMMARY OF ONCOLOGIC HISTORY: Oncology History   Breast cancer of lower-outer quadrant of right female breast [facility])   Staging form: Breast, AJCC 7th Edition     Clinical stage from 11/15/2014: Stage 0 (Tis (DCIS), N0, M0) - Signed by Truitt Merle, MD on 11/26/2014       Breast cancer of lower-outer quadrant of right female breast ([facility])   11/06/2014 Mammogram Screening mammogram showed calcifications in the lower outer quadrant of the right breast, which was confirmed by diagnostic mammogram   11/15/2014 Initial Biopsy Breast core needle biopsy showed ductal carcinoma in situ with calcifications.   11/15/2014 Initial Diagnosis Breast cancer of lower-outer quadrant of right female breast ([facility])   11/15/2014 Receptors her2 ER 100% positive, PR 90% positive    In terms of breast cancer risk profile:  She menarched at early age of 53 and went to menopause at age 61 (hysrectomy)  She had 2 pregnancy and 2 children, her first child was born at age 48 She did not breast-fed her child.  She received birth control pills for approximately 5 years   She was never exposed to fertility medications or hormone replacement therapy.  She has family history of Breast cancer (mother)   MEDICAL HISTORY:  Past Medical History  Diagnosis Date  . ALLERGIC RHINITIS 05/15/2008  . ASTHMA 05/15/2008  . Headache(784.0) 05/15/2008  . HYPERTENSION 05/15/2008  . IMPAIRED GLUCOSE TOLERANCE 05/15/2008  . OSTEOARTHRITIS 05/15/2008  . Obesity     SURGICAL HISTORY: Past Surgical History  Procedure Laterality Date  . Cesarean section    . Abdominal hysterectomy    . Knee surgery      arthroscopic left    SOCIAL HISTORY: Social History   Social History  . Marital Status: Widowed    Spouse Name: N/A  . Number of Children: N/A  . Years of Education: N/A   Occupational History  . Retired    Social History Main Topics  . Smoking status: Never Smoker   . Smokeless tobacco: Never Used  . Alcohol Use: Yes     Comment: RARELY  . Drug Use: No  . Sexual Activity: Not on file   Other Topics Concern  . Not on file   Social History Narrative    FAMILY HISTORY: Family History  Problem Relation Age of Onset  . Diabetes Mother   . Hypertension Mother   . Cancer Mother 54    Breast cancer twice   . Hyperlipidemia Father   . Cancer Brother     pancreatic cancer   . Diabetes Brother     ALLERGIES:  is allergic to macrobid; naproxen; and zanaflex.  MEDICATIONS:  Current Outpatient Prescriptions  Medication Sig Dispense Refill  . acetaminophen (TYLENOL) 650 MG CR tablet Take 650 mg by mouth every 8 (eight) hours as needed.      . chlorthalidone (HYGROTON) 25 MG tablet TAKE 1 TABLET (25 MG TOTAL) BY MOUTH DAILY. 90 tablet 1  . fluticasone (FLONASE) 50 MCG/ACT nasal spray Place 1 spray into both nostrils daily. 16 g 5  . glucose blood (FREESTYLE LITE) test strip 1 each by Other route 2 (two) times daily. Use as instructed 100 each 12  . KLOR-CON M20 20 MEQ tablet TAKE 1 TABLET DAILY. 90 tablet 1  . metFORMIN (GLUCOPHAGE-XR) 500 MG 24 hr  tablet TAKE 2 TABLETS (1,000 MG TOTAL) BY MOUTH DAILY WITH SUPPER. 180 tablet 4  . Multiple Vitamin (MULTIVITAMIN) tablet Take 1 tablet by mouth daily.      . NON FORMULARY WEEKLY ALLERGY INJECTIONS     . pantoprazole (PROTONIX) 40 MG tablet Take 1 tablet (40 mg total) by mouth daily. 90 tablet 3  . PROAIR HFA 108 (90 BASE) MCG/ACT inhaler Inhale 1 puff into the lungs every 6 (six) hours as needed.   0  . traMADol (ULTRAM) 50 MG tablet Take 1 tablet (50 mg total) by mouth every 6 (six) hours as needed. for pain 50 tablet 3  . aspirin 81 MG tablet Take 81 mg by mouth daily.      Marland Kitchen EPIPEN 2-PAK 0.3 MG/0.3ML SOAJ injection 0.3 mg.   1   No current facility-administered medications for this visit.    REVIEW OF SYSTEMS:   Constitutional: Denies fevers, chills or abnormal night sweats Eyes: Denies blurriness of vision, double vision or watery eyes Ears, nose, mouth, throat, and face: Denies mucositis or sore throat Respiratory: Denies cough, dyspnea or wheezes Cardiovascular: Denies palpitation, chest discomfort or lower extremity swelling Gastrointestinal:  Denies nausea, heartburn or change in bowel habits Skin: Denies abnormal skin rashes Lymphatics: Denies new lymphadenopathy or easy bruising Neurological:Denies numbness, tingling or new weaknesses Behavioral/Psych: Mood is stable, no new changes  All other systems were reviewed with the patient and are negative.  PHYSICAL EXAMINATION: ECOG PERFORMANCE STATUS: 1 - Symptomatic but completely ambulatory  Filed Vitals:   11/26/14 1443  BP: 161/53  Pulse: 77  Temp: 98.4 F (36.9 C)  Resp: 18   Filed Weights   11/26/14 1443  Weight: 262 lb 4.8 oz (118.978 kg)    GENERAL:alert, no distress and comfortable SKIN: skin color, texture, turgor are normal, no rashes or significant lesions EYES: normal, conjunctiva are pink and non-injected, sclera clear OROPHARYNX:no exudate, no erythema and lips, buccal mucosa, and tongue normal   NECK: supple, thyroid normal size, non-tender, without nodularity LYMPH:  no palpable lymphadenopathy in the cervical, axillary or inguinal LUNGS: clear to auscultation and percussion with normal breathing effort HEART: regular rate & rhythm and no murmurs and no lower extremity edema ABDOMEN:abdomen soft, non-tender and normal bowel sounds Musculoskeletal:no cyanosis of digits and no clubbing  PSYCH: alert & oriented x 3 with fluent speech NEURO: no focal motor/sensory deficits Breasts: Breast inspection showed them to be symmetrical with no nipple discharge. (+) Small ecchymosis and a small lump at the biopsy site, likely hematoma.  Palpation of the left breast and axilla revealed no obvious mass that I could appreciate.   LABORATORY DATA:  I have reviewed the data as listed Lab Results  Component Value Date   WBC 5.3 07/23/2014  HGB 14.8 07/23/2014   HCT 43.1 07/23/2014   MCV 97.6 07/23/2014   PLT 152.0 07/23/2014   Lab Results  Component Value Date   NA 139 07/23/2014   K 3.2* 07/23/2014   CL 100 07/23/2014   CO2 31 07/23/2014    PATHOLOGY REPORT Diagnosis 11/15/2014 Breast, right, needle core biopsy, lower outer - DUCTAL CARCINOMA IN SITU WITH CALCIFICATIONS. - FIBROADENOMA. - SEE COMMENT. Results: IMMUNOHISTOCHEMICAL AND MORPHOMETRIC ANALYSIS PERFORMED MANUALLY Estrogen Receptor: 100%, POSITIVE, STRONG STAINING INTENSITY Progesterone Receptor: 90%, POSITIVE, STRONG STAINING INTENSITY   RADIOGRAPHIC STUDIES: I have personally reviewed the radiological images as listed and agreed with the findings in the report.  Mm Digital Diagnostic Unilat R 11/12/2014      IMPRESSION: Grouped punctate and amorphous calcifications confirmed within the lower outer quadrant of the right breast, 6-7 o'clock axis region, for which stereotactic biopsy is recommended.  RECOMMENDATION: Stereotactic biopsy, possibly with tomosynthesis guidance, for the right breast calcifications.   Stereotactic biopsy is scheduled for September 23rd.  I have discussed the findings and recommendations with the patient. Results were also provided in writing at the conclusion of the visit. If applicable, a reminder letter will be sent to the patient regarding the next appointment.  BI-RADS CATEGORY  4: Suspicious.   Electronically Signed   By: Franki Cabot M.D.   On: 11/12/2014 12:09   Mm Digital Screening Bilateral 11/06/2014   ACR Breast Density Category b: There are scattered areas of fibroglandular density.  FINDINGS: In the right breast, calcifications warrant further evaluation with magnified views. In the left breast, no findings suspicious for malignancy. Images were processed with CAD.  IMPRESSION: Further evaluation is suggested for calcifications in the right breast.  RECOMMENDATION: Diagnostic mammogram of the right breast. (Code:FI-R-3M)  The patient will be contacted regarding the findings, and additional imaging will be scheduled.  BI-RADS CATEGORY  0: Incomplete. Need additional imaging evaluation and/or prior mammograms for comparison.   Electronically Signed   By: Nolon Nations M.D.   On: 11/06/2014 10:27     ASSESSMENT: 15 -year-old African-American female, with past medical history of hypertension, IgG, arthritis, chronic back pain, obesity, and the well-controlled asthma, who was found to have right breast DCIS by screening mammogram.  PLAN:  #1 right breast DCIS, ER/PR positive -The patient had early stage disease. She will be cured by complete surgical resection.  -She is scheduled to have right lumpectomy by Dr. Brantley Stage on August 25 -Any form of adjuvant treatment is for prevention of disease recurrence.   -She is scheduled to see radiation oncologist Dr. Valere Dross next week -Giving a strong ER/PR positivity of her tumor, I recommend chemoprevention with tamoxifen or anastrozole. She does have moderate arthritis, and has had hysterectomy in the past, no risk for endometrial  cancer, I recommended tamoxifen. The potential side effects, including hot flash, skiing and vaginal dryness, slight increase of cardiovascular disease, thrombosis, were discussed with her, and I given her a written material about tamoxifen today.  I plan to see her back in approximately 4 weeks, to review her surgical past, and for further discussion about the role of adjuvant endocrine therapy.   My plan would be to start her on adjuvant tamoxifen after she recovers from radiation treatment   #2 Bone health Her last DEXA scan was 2011, will repeat a 1 after her surgery.  #3 arthritis, hypertension, diabetes -She'll continue follow-up with her primary care physician  Plan -I'll see her back in 4 weeks.  All  questions were answered. The patient knows to call the clinic with any problems, questions or concerns. I spent 50 minutes counseling the patient face to face. The total time spent in the appointment was 60 minutes and more than 50% was on counseling.     Truitt Merle, MD 11/26/2014 3:45 PM

## 2014-11-26 NOTE — Telephone Encounter (Signed)
Pt confirmed MD visit per 10/04 POF, gave pt AVS and Calendar.Marland Kitchen KJ

## 2014-11-26 NOTE — Addendum Note (Signed)
Addended by: Jesse Fall on: 11/26/2014 04:03 PM   Modules accepted: Orders

## 2014-11-29 ENCOUNTER — Encounter: Payer: Self-pay | Admitting: Hematology

## 2014-11-29 NOTE — Progress Notes (Signed)
Called pt to introduce myself as Estate manager/land agent and to see if patient has any financial questions or concerns. Pt unsure if she has met deductible/oop for this year. Explained to pt what my role is once treatment plan has been established. Pt states she will be having Radiation at this time. Gave pt Meredith's contact information for Radiation financial questions or concerns. Pt has my name and number for any other questions or concerns regarding financial assistance.

## 2014-12-03 ENCOUNTER — Encounter: Payer: Self-pay | Admitting: Radiation Oncology

## 2014-12-03 NOTE — Progress Notes (Signed)
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 - SEE COMMENT.  To have a Lumpectomy on 03/18/14  Receptor Status: ER(100%), PR (90%), Her2-neu ()  Darlene Mclaughlin. Darlene Mclaughlin presented with a positive mammogram finding which showed calcifications in the lower outer quadrant of the right breast  Past/Anticipated interventions by surgeon, if XID:HWYSHU of Right Breast  Past/Anticipated interventions by medical oncology, if any: Chemotherapy - adjuvant Tamoxifen following radiation therapy  Lymphedema issues, if any: None  Pain issues, if any:  Yes- constant from Arthritis  SAFETY ISSUES:  Prior radiation? No  Pacemaker/ICD? No  Possible current pregnancy?No  Is the patient on methotrexate? No  Current Complaints / other details:   She menarched at early age of 74 and went to menopause at age 17 (hysterectomy)  She had 2 pregnancy and 2 children, her first child was born at age 69 She did not breast-fed her child.  She received birth control pills for approximately 5 years  She was never exposed to fertility medications or hormone replacement therapy.  She has family history of Breast cancer (mother)     Darlene Mclaughlin, Darlene Curb, RN 12/03/2014,4:50 PM

## 2014-12-04 ENCOUNTER — Ambulatory Visit
Admission: RE | Admit: 2014-12-04 | Discharge: 2014-12-04 | Disposition: A | Discharge status: Home / Self Care | Payer: Medicare Other | Source: Ambulatory Visit | Attending: Radiation Oncology | Admitting: Radiation Oncology

## 2014-12-04 ENCOUNTER — Encounter: Payer: Self-pay | Admitting: Radiation Oncology

## 2014-12-04 ENCOUNTER — Telehealth: Payer: Self-pay | Admitting: *Deleted

## 2014-12-04 VITALS — BP 121/71 | HR 99 | Temp 99.6°F | Ht 65.0 in | Wt 258.2 lb

## 2014-12-04 DIAGNOSIS — E669 Obesity, unspecified: Secondary | ICD-10-CM | POA: Diagnosis not present

## 2014-12-04 DIAGNOSIS — Z170 Estrogen receptor positive status [ER+]: Secondary | ICD-10-CM | POA: Diagnosis not present

## 2014-12-04 DIAGNOSIS — C50511 Malignant neoplasm of lower-outer quadrant of right female breast: Secondary | ICD-10-CM

## 2014-12-04 DIAGNOSIS — Z7982 Long term (current) use of aspirin: Secondary | ICD-10-CM | POA: Insufficient documentation

## 2014-12-04 DIAGNOSIS — Z6841 Body Mass Index (BMI) 40.0 and over, adult: Secondary | ICD-10-CM | POA: Diagnosis not present

## 2014-12-04 DIAGNOSIS — Z79899 Other long term (current) drug therapy: Secondary | ICD-10-CM | POA: Insufficient documentation

## 2014-12-04 NOTE — Addendum Note (Signed)
Encounter addended by: Benn Moulder, RN on: 12/04/2014  2:34 PM<BR>     Documentation filed: Charges VN

## 2014-12-04 NOTE — Progress Notes (Signed)
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 female who is seen today through the courtesy of Dr. Brantley Stage for evaluation of her DCIS of the right breast.  At the time of a screening mammogram at [facility] on 11/05/2014 she was felt to have suspicious calcifications within the right breast.  Additional views on 11/12/2014 showed a grouped punctate and amorphous calcifications within the lower outer quadrant in the 6 to 7:00 position.  Stereotactic biopsy on 11/15/2014 was diagnostic for DCIS, intermediate grade which was strongly ER/PR positive.  She tells me she is scheduled for surgery with Dr. Brantley Stage on October 25.  She is without complaints today. PREVIOUS RADIATION THERAPY: No   PAST MEDICAL HISTORY:  has a past medical history of ALLERGIC RHINITIS (05/15/2008); ASTHMA (05/15/2008); VOZDGUYQ(034.7) (05/15/2008); HYPERTENSION (05/15/2008); IMPAIRED GLUCOSE TOLERANCE (05/15/2008); OSTEOARTHRITIS (05/15/2008); and Obesity.     PAST SURGICAL HISTORY:  Past Surgical History  Procedure Laterality Date  . Cesarean section    . Abdominal hysterectomy    . Knee surgery      arthroscopic left     FAMILY HISTORY: family history includes Cancer in her brother; Cancer (age of onset: 76) in her mother; Diabetes in her brother and mother; Hyperlipidemia in her father; Hypertension in her mother.  Her mother died at age 47 with a history of breast cancer diagnoses at age 70, and again at 99 (separate primaries).  Her father died at 9 from cardiac disease.   SOCIAL HISTORY:  reports that she has never smoked. She has never used smokeless  tobacco. She reports that she drinks alcohol. She reports that she does not use illicit drugs.  Would've for the past 6 years, 2 sons.  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   ALLERGIES: Macrobid; Naproxen; and Zanaflex   MEDICATIONS:  Current Outpatient Prescriptions  Medication Sig Dispense Refill  . acetaminophen (TYLENOL) 650 MG CR tablet Take 650 mg by mouth every 8 (eight) hours as needed.      Marland Kitchen aspirin 81 MG tablet Take 81 mg by mouth daily.      . chlorthalidone (HYGROTON) 25 MG tablet TAKE 1 TABLET (25 MG TOTAL) BY MOUTH DAILY. 90 tablet 1  . EPIPEN 2-PAK 0.3 MG/0.3ML SOAJ injection 0.3 mg.   1  . fluticasone (FLONASE) 50 MCG/ACT nasal spray Place 1 spray into both nostrils daily. 16 g 5  . glucose blood (FREESTYLE LITE) test strip 1 each by Other route 2 (two) times daily. Use as instructed 100 each 12  . KLOR-CON M20 20 MEQ tablet TAKE 1 TABLET DAILY. 90 tablet 1  . metFORMIN (GLUCOPHAGE-XR) 500 MG 24 hr tablet TAKE 2 TABLETS (1,000 MG TOTAL) BY MOUTH DAILY WITH SUPPER. 180 tablet 4  . Multiple Vitamin (MULTIVITAMIN) tablet Take 1 tablet by mouth daily.      . NON FORMULARY WEEKLY ALLERGY INJECTIONS     . pantoprazole (PROTONIX) 40 MG tablet Take 1 tablet (40 mg total) by mouth daily. 90 tablet 3  . PROAIR HFA 108 (90  BASE) MCG/ACT inhaler Inhale 1 puff into the lungs every 6 (six) hours as needed.   0  . traMADol (ULTRAM) 50 MG tablet Take 1 tablet (50 mg total) by mouth every 6 (six) hours as needed. for pain 50 tablet 3   No current facility-administered medications for this encounter.     REVIEW OF SYSTEMS:  Pertinent items are noted in HPI.    PHYSICAL EXAM:  height is 5\' 5"  (1.651 m) and weight is 258 lb 3.2 oz (117.119 kg). Her temperature is 99.6 F (37.6 C). Her blood pressure is 121/71 and her pulse is 99.   Alert and oriented 63 year old African-American female appearing younger than her stated age.  Nodes: There is no  palpable cervical, supraclavicular, or axillary lymphadenopathy.  Breasts: She is large breasted.  There is a punctate biopsy wound at approximately 7:00 along the lower outer quadrant of the right breast.  No masses are appreciated.  Extremities: Without edema.   LABORATORY DATA:  Lab Results  Component Value Date   WBC 5.3 07/23/2014   HGB 14.8 07/23/2014   HCT 43.1 07/23/2014   MCV 97.6 07/23/2014   PLT 152.0 07/23/2014   Lab Results  Component Value Date   NA 139 07/23/2014   K 3.2* 07/23/2014   CL 100 07/23/2014   CO2 31 07/23/2014   Lab Results  Component Value Date   ALT 47* 09/05/2014   AST 37 09/05/2014   ALKPHOS 68 09/05/2014   BILITOT 1.1 09/05/2014      IMPRESSION: Stage 0 (Tis N0 M0) DCIS of the right breast.  We discussed local management options which include mastectomy or partial mastectomy.  We will obtain a post surgical mammogram to confirm removal of all suspicious microcalcifications.  Ordinarily, we would offer her post operative radiation therapy but there is a chance that her risk for recurrence would be satisfactorily low that she may do well with antiestrogen therapy alone.  I will make a final recommendation following her surgery.   PLAN: As discussed above.  I will see her for a follow-up visit in early November.   I spent 45  minutes face to face with the patient and more than 50% of that time was spent in counseling and/or coordination of care.

## 2014-12-04 NOTE — Addendum Note (Signed)
Encounter addended by: Benn Moulder, RN on: 12/04/2014  3:34 PM<BR>     Documentation filed: Arn Medal VN

## 2014-12-04 NOTE — Telephone Encounter (Signed)
SPOKE WITH MS. Darlene Mclaughlin ADVISED HER OF HER APPOINTMENT ON NOV 8TH @ 9 AM

## 2014-12-06 ENCOUNTER — Telehealth: Payer: Self-pay | Admitting: *Deleted

## 2014-12-06 NOTE — Telephone Encounter (Signed)
Spoke to pt concerning care plan and needs. Pt denies questions or concerns regarding dx or treatment care plan. Encourage pt to call with needs. Received verbal understanding. Discuss survivorship program that she will be referred to at the end of xrt.  Contact information given.

## 2014-12-06 NOTE — Telephone Encounter (Signed)
Left vm for pt to return call regarding navigation resources. Contact information given. 

## 2014-12-09 ENCOUNTER — Other Ambulatory Visit (INDEPENDENT_AMBULATORY_CARE_PROVIDER_SITE_OTHER): Payer: Medicare Other

## 2014-12-09 ENCOUNTER — Other Ambulatory Visit: Payer: Self-pay | Admitting: *Deleted

## 2014-12-09 DIAGNOSIS — R7989 Other specified abnormal findings of blood chemistry: Secondary | ICD-10-CM

## 2014-12-09 DIAGNOSIS — R945 Abnormal results of liver function studies: Principal | ICD-10-CM

## 2014-12-09 LAB — HEPATIC FUNCTION PANEL
ALT: 21 U/L (ref 0–35)
AST: 21 U/L (ref 0–37)
Albumin: 3.6 g/dL (ref 3.5–5.2)
Alkaline Phosphatase: 71 U/L (ref 39–117)
Bilirubin, Direct: 0.2 mg/dL (ref 0.0–0.3)
Total Bilirubin: 0.6 mg/dL (ref 0.2–1.2)
Total Protein: 7.5 g/dL (ref 6.0–8.3)

## 2014-12-10 ENCOUNTER — Encounter (HOSPITAL_BASED_OUTPATIENT_CLINIC_OR_DEPARTMENT_OTHER): Payer: Self-pay | Admitting: *Deleted

## 2014-12-11 ENCOUNTER — Encounter (HOSPITAL_BASED_OUTPATIENT_CLINIC_OR_DEPARTMENT_OTHER)
Admission: RE | Admit: 2014-12-11 | Discharge: 2014-12-11 | Disposition: A | Discharge status: Home / Self Care | Payer: Medicare Other | Source: Ambulatory Visit | Attending: Surgery | Admitting: Surgery

## 2014-12-11 DIAGNOSIS — D0511 Intraductal carcinoma in situ of right breast: Secondary | ICD-10-CM | POA: Diagnosis not present

## 2014-12-11 DIAGNOSIS — E119 Type 2 diabetes mellitus without complications: Secondary | ICD-10-CM | POA: Diagnosis not present

## 2014-12-11 DIAGNOSIS — I10 Essential (primary) hypertension: Secondary | ICD-10-CM | POA: Diagnosis not present

## 2014-12-11 DIAGNOSIS — Z6841 Body Mass Index (BMI) 40.0 and over, adult: Secondary | ICD-10-CM | POA: Diagnosis not present

## 2014-12-11 LAB — COMPREHENSIVE METABOLIC PANEL
ALT: 22 U/L (ref 14–54)
AST: 27 U/L (ref 15–41)
Albumin: 3.5 g/dL (ref 3.5–5.0)
Alkaline Phosphatase: 69 U/L (ref 38–126)
Anion gap: 12 (ref 5–15)
BUN: 11 mg/dL (ref 6–20)
CO2: 28 mmol/L (ref 22–32)
Calcium: 9.3 mg/dL (ref 8.9–10.3)
Chloride: 99 mmol/L — ABNORMAL LOW (ref 101–111)
Creatinine, Ser: 0.61 mg/dL (ref 0.44–1.00)
GFR calc Af Amer: >60 mL/min (ref >60)
GFR calc non Af Amer: >60 mL/min (ref >60)
Glucose, Bld: 150 mg/dL — ABNORMAL HIGH (ref 65–99)
Potassium: 3.4 mmol/L — ABNORMAL LOW (ref 3.5–5.1)
Sodium: 139 mmol/L (ref 135–145)
Total Bilirubin: 0.7 mg/dL (ref 0.3–1.2)
Total Protein: 7.5 g/dL (ref 6.5–8.1)

## 2014-12-11 LAB — CBC WITH DIFFERENTIAL/PLATELET
Basophils Absolute: 0 10*3/uL (ref 0.0–0.1)
Basophils Relative: 1 %
Eosinophils Absolute: 0.2 10*3/uL (ref 0.0–0.7)
Eosinophils Relative: 3 %
HCT: 42 % (ref 36.0–46.0)
Hemoglobin: 14.5 g/dL (ref 12.0–15.0)
Lymphocytes Relative: 30 %
Lymphs Abs: 2.4 10*3/uL (ref 0.7–4.0)
MCH: 33.6 pg (ref 26.0–34.0)
MCHC: 34.5 g/dL (ref 30.0–36.0)
MCV: 97.2 fL (ref 78.0–100.0)
Monocytes Absolute: 0.7 10*3/uL (ref 0.1–1.0)
Monocytes Relative: 9 %
Neutro Abs: 4.7 10*3/uL (ref 1.7–7.7)
Neutrophils Relative %: 57 %
Platelets: 192 10*3/uL (ref 150–400)
RBC: 4.32 MIL/uL (ref 3.87–5.11)
RDW: 12.1 % (ref 11.5–15.5)
WBC: 8 10*3/uL (ref 4.0–10.5)

## 2014-12-16 ENCOUNTER — Ambulatory Visit
Admission: RE | Admit: 2014-12-16 | Discharge: 2014-12-16 | Disposition: A | Discharge status: Home / Self Care | Payer: Medicare Other | Source: Ambulatory Visit | Attending: Surgery | Admitting: Surgery

## 2014-12-16 DIAGNOSIS — D0511 Intraductal carcinoma in situ of right breast: Secondary | ICD-10-CM

## 2014-12-17 ENCOUNTER — Encounter (HOSPITAL_BASED_OUTPATIENT_CLINIC_OR_DEPARTMENT_OTHER)
Admission: RE | Disposition: A | Discharge status: Home / Self Care | Payer: Self-pay | Source: Ambulatory Visit | Attending: Surgery

## 2014-12-17 ENCOUNTER — Ambulatory Visit (HOSPITAL_BASED_OUTPATIENT_CLINIC_OR_DEPARTMENT_OTHER): Payer: Medicare Other | Admitting: Anesthesiology

## 2014-12-17 ENCOUNTER — Ambulatory Visit
Admission: RE | Admit: 2014-12-17 | Discharge: 2014-12-17 | Disposition: A | Discharge status: Home / Self Care | Payer: Medicare Other | Source: Ambulatory Visit | Attending: Surgery | Admitting: Surgery

## 2014-12-17 ENCOUNTER — Ambulatory Visit (HOSPITAL_BASED_OUTPATIENT_CLINIC_OR_DEPARTMENT_OTHER)
Admission: RE | Admit: 2014-12-17 | Discharge: 2014-12-17 | Disposition: A | Discharge status: Home / Self Care | Payer: Medicare Other | Source: Ambulatory Visit | Attending: Surgery | Admitting: Surgery

## 2014-12-17 ENCOUNTER — Encounter (HOSPITAL_BASED_OUTPATIENT_CLINIC_OR_DEPARTMENT_OTHER): Payer: Self-pay | Admitting: Anesthesiology

## 2014-12-17 DIAGNOSIS — I10 Essential (primary) hypertension: Secondary | ICD-10-CM | POA: Insufficient documentation

## 2014-12-17 DIAGNOSIS — E119 Type 2 diabetes mellitus without complications: Secondary | ICD-10-CM | POA: Insufficient documentation

## 2014-12-17 DIAGNOSIS — D0511 Intraductal carcinoma in situ of right breast: Secondary | ICD-10-CM | POA: Insufficient documentation

## 2014-12-17 DIAGNOSIS — Z6841 Body Mass Index (BMI) 40.0 and over, adult: Secondary | ICD-10-CM | POA: Insufficient documentation

## 2014-12-17 HISTORY — DX: Type 2 diabetes mellitus without complications: E11.9

## 2014-12-17 HISTORY — PX: BREAST LUMPECTOMY WITH RADIOACTIVE SEED LOCALIZATION: SHX6424

## 2014-12-17 HISTORY — PX: BREAST LUMPECTOMY: SHX2

## 2014-12-17 HISTORY — DX: Gastro-esophageal reflux disease without esophagitis: K21.9

## 2014-12-17 HISTORY — DX: Malignant (primary) neoplasm, unspecified: C80.1

## 2014-12-17 LAB — GLUCOSE, CAPILLARY
Glucose-Capillary: 117 mg/dL — ABNORMAL HIGH (ref 65–99)
Glucose-Capillary: 110 mg/dL — ABNORMAL HIGH (ref 65–99)

## 2014-12-17 SURGERY — BREAST LUMPECTOMY WITH RADIOACTIVE SEED LOCALIZATION
Anesthesia: General | Site: Breast | Laterality: Right

## 2014-12-17 MED ORDER — HYDROMORPHONE HCL 1 MG/ML IJ SOLN: 0.2500 mg | INTRAMUSCULAR | Status: DC | PRN

## 2014-12-17 MED ORDER — MIDAZOLAM HCL 2 MG/2ML IJ SOLN: 1.0000 mg | INTRAMUSCULAR | Status: DC | PRN

## 2014-12-17 MED ORDER — OXYCODONE-ACETAMINOPHEN 5-325 MG PO TABS: 1.0000 | ORAL_TABLET | ORAL | Status: DC | PRN

## 2014-12-17 MED ORDER — BUPIVACAINE-EPINEPHRINE (PF) 0.25% -1:200000 IJ SOLN
INTRAMUSCULAR | Status: DC | PRN
  Administered 2014-12-17: 10 mL

## 2014-12-17 MED ORDER — CEFAZOLIN SODIUM-DEXTROSE 2-3 GM-% IV SOLR
INTRAVENOUS | Status: AC
  Filled 2014-12-17: qty 50

## 2014-12-17 MED ORDER — GLYCOPYRROLATE 0.2 MG/ML IJ SOLN: 0.2000 mg | Freq: Once | INTRAMUSCULAR | Status: DC | PRN

## 2014-12-17 MED ORDER — LIDOCAINE HCL (CARDIAC) 20 MG/ML IV SOLN
INTRAVENOUS | Status: DC | PRN
  Administered 2014-12-17: 50 mg via INTRAVENOUS

## 2014-12-17 MED ORDER — DEXTROSE 5 % IV SOLN: 3.0000 g | INTRAVENOUS | Status: DC

## 2014-12-17 MED ORDER — MIDAZOLAM HCL 2 MG/2ML IJ SOLN
INTRAMUSCULAR | Status: AC
  Filled 2014-12-17: qty 4

## 2014-12-17 MED ORDER — MIDAZOLAM HCL 5 MG/5ML IJ SOLN
INTRAMUSCULAR | Status: DC | PRN
  Administered 2014-12-17: 2 mg via INTRAVENOUS

## 2014-12-17 MED ORDER — FENTANYL CITRATE (PF) 100 MCG/2ML IJ SOLN
INTRAMUSCULAR | Status: DC | PRN
  Administered 2014-12-17 (×2): 50 ug via INTRAVENOUS
  Administered 2014-12-17: 100 ug via INTRAVENOUS

## 2014-12-17 MED ORDER — FENTANYL CITRATE (PF) 100 MCG/2ML IJ SOLN: 50.0000 ug | INTRAMUSCULAR | Status: DC | PRN

## 2014-12-17 MED ORDER — METHYLENE BLUE 1 % INJ SOLN
INTRAMUSCULAR | Status: AC
  Filled 2014-12-17: qty 10

## 2014-12-17 MED ORDER — PROMETHAZINE HCL 25 MG/ML IJ SOLN
6.2500 mg | INTRAMUSCULAR | Status: DC | PRN
Start: 2014-12-17 — End: 2014-12-17

## 2014-12-17 MED ORDER — PROPOFOL 10 MG/ML IV BOLUS
INTRAVENOUS | Status: AC
  Filled 2014-12-17: qty 20

## 2014-12-17 MED ORDER — LIDOCAINE HCL (CARDIAC) 20 MG/ML IV SOLN
INTRAVENOUS | Status: AC
  Filled 2014-12-17: qty 5

## 2014-12-17 MED ORDER — ONDANSETRON HCL 4 MG/2ML IJ SOLN
INTRAMUSCULAR | Status: DC | PRN
  Administered 2014-12-17: 4 mg via INTRAVENOUS

## 2014-12-17 MED ORDER — DEXAMETHASONE SODIUM PHOSPHATE 10 MG/ML IJ SOLN
INTRAMUSCULAR | Status: AC
  Filled 2014-12-17: qty 1

## 2014-12-17 MED ORDER — CEFAZOLIN SODIUM-DEXTROSE 2-3 GM-% IV SOLR
INTRAVENOUS | Status: DC | PRN
  Administered 2014-12-17: 2 g via INTRAVENOUS

## 2014-12-17 MED ORDER — OXYCODONE-ACETAMINOPHEN 5-325 MG PO TABS
ORAL_TABLET | ORAL | Status: AC
  Filled 2014-12-17: qty 1

## 2014-12-17 MED ORDER — SCOPOLAMINE 1 MG/3DAYS TD PT72: 1.0000 | MEDICATED_PATCH | Freq: Once | TRANSDERMAL | Status: DC | PRN

## 2014-12-17 MED ORDER — LACTATED RINGERS IV SOLN
INTRAVENOUS | Status: DC
  Administered 2014-12-17 (×2): via INTRAVENOUS

## 2014-12-17 MED ORDER — SODIUM CHLORIDE 0.9 % IJ SOLN
INTRAMUSCULAR | Status: AC
  Filled 2014-12-17: qty 10

## 2014-12-17 MED ORDER — OXYCODONE-ACETAMINOPHEN 5-325 MG PO TABS
1.0000 | ORAL_TABLET | Freq: Once | ORAL | Status: AC
  Administered 2014-12-17: 1 via ORAL

## 2014-12-17 MED ORDER — SODIUM CHLORIDE 0.9 % IR SOLN
Status: DC | PRN
  Administered 2014-12-17: 200 mL

## 2014-12-17 MED ORDER — FENTANYL CITRATE (PF) 100 MCG/2ML IJ SOLN
INTRAMUSCULAR | Status: AC
  Filled 2014-12-17: qty 4

## 2014-12-17 MED ORDER — ONDANSETRON HCL 4 MG/2ML IJ SOLN
INTRAMUSCULAR | Status: AC
  Filled 2014-12-17: qty 2

## 2014-12-17 MED ORDER — PROPOFOL 10 MG/ML IV BOLUS
INTRAVENOUS | Status: DC | PRN
  Administered 2014-12-17: 200 mg via INTRAVENOUS

## 2014-12-17 SURGICAL SUPPLY — 43 items
APPLIER CLIP 9.375 MED OPEN (MISCELLANEOUS) ×2
APR CLP MED 9.3 20 MLT OPN (MISCELLANEOUS) ×1
BINDER BREAST XLRG (GAUZE/BANDAGES/DRESSINGS) IMPLANT
BINDER BREAST XXLRG (GAUZE/BANDAGES/DRESSINGS) IMPLANT
BLADE SURG 15 STRL LF DISP TIS (BLADE) ×1 IMPLANT
BLADE SURG 15 STRL SS (BLADE) ×2
CANISTER SUC SOCK COL 7IN (MISCELLANEOUS) ×1 IMPLANT
CANISTER SUCT 1200ML W/VALVE (MISCELLANEOUS) IMPLANT
CHLORAPREP W/TINT 26ML (MISCELLANEOUS) ×2 IMPLANT
CLIP APPLIE 9.375 MED OPEN (MISCELLANEOUS) IMPLANT
COVER BACK TABLE 60X90IN (DRAPES) ×2 IMPLANT
COVER MAYO STAND STRL (DRAPES) ×2 IMPLANT
COVER PROBE W GEL 5X96 (DRAPES) ×2 IMPLANT
DEVICE DUBIN W/COMP PLATE 8390 (MISCELLANEOUS) ×2 IMPLANT
DRAPE LAPAROTOMY 100X72 PEDS (DRAPES) ×2 IMPLANT
DRAPE UTILITY XL STRL (DRAPES) ×2 IMPLANT
ELECT COATED BLADE 2.86 ST (ELECTRODE) ×2 IMPLANT
ELECT REM PT RETURN 9FT ADLT (ELECTROSURGICAL) ×2
ELECTRODE REM PT RTRN 9FT ADLT (ELECTROSURGICAL) ×1 IMPLANT
GLOVE BIOGEL PI IND STRL 7.0 (GLOVE) IMPLANT
GLOVE BIOGEL PI IND STRL 8 (GLOVE) ×1 IMPLANT
GLOVE BIOGEL PI INDICATOR 7.0 (GLOVE) ×2
GLOVE BIOGEL PI INDICATOR 8 (GLOVE) ×1
GLOVE ECLIPSE 6.5 STRL STRAW (GLOVE) ×1 IMPLANT
GLOVE ECLIPSE 8.0 STRL XLNG CF (GLOVE) ×2 IMPLANT
GOWN STRL REUS W/ TWL LRG LVL3 (GOWN DISPOSABLE) ×2 IMPLANT
GOWN STRL REUS W/TWL LRG LVL3 (GOWN DISPOSABLE) ×4
HEMOSTAT SNOW SURGICEL 2X4 (HEMOSTASIS) IMPLANT
KIT MARKER MARGIN INK (KITS) ×2 IMPLANT
LIQUID BAND (GAUZE/BANDAGES/DRESSINGS) ×2 IMPLANT
NDL HYPO 25X1 1.5 SAFETY (NEEDLE) ×1 IMPLANT
NEEDLE HYPO 25X1 1.5 SAFETY (NEEDLE) ×2 IMPLANT
NS IRRIG 1000ML POUR BTL (IV SOLUTION) ×2 IMPLANT
PACK BASIN DAY SURGERY FS (CUSTOM PROCEDURE TRAY) ×2 IMPLANT
PENCIL BUTTON HOLSTER BLD 10FT (ELECTRODE) ×2 IMPLANT
SLEEVE SCD COMPRESS KNEE MED (MISCELLANEOUS) ×2 IMPLANT
SPONGE LAP 18X18 X RAY DECT (DISPOSABLE) ×1 IMPLANT
SUT MNCRL AB 4-0 PS2 18 (SUTURE) ×2 IMPLANT
SUT VICRYL 3-0 CR8 SH (SUTURE) ×3 IMPLANT
SYR CONTROL 10ML LL (SYRINGE) ×2 IMPLANT
TOWEL OR 17X24 6PK STRL BLUE (TOWEL DISPOSABLE) ×2 IMPLANT
TUBE CONNECTING 20X1/4 (TUBING) ×1 IMPLANT
YANKAUER SUCT BULB TIP NO VENT (SUCTIONS) ×1 IMPLANT

## 2014-12-17 NOTE — Interval H&P Note (Signed)
History and Physical Interval Note:  12/17/2014 9:48 AM  Darlene Mclaughlin  has presented today for surgery, with the diagnosis of Right Breast DCIS  The various methods of treatment have been discussed with the patient and family. After consideration of risks, benefits and other options for treatment, the patient has consented to  Procedure(s): RIGHT BREAST RADIOACTIVE SEED GUIDED PARTIAL MASTECTOMY (Right) as a surgical intervention .  The patient's history has been reviewed, patient examined, no change in status, stable for surgery.  I have reviewed the patient's chart and labs.  Questions were answered to the patient's satisfaction.     Elene Downum A.

## 2014-12-17 NOTE — Discharge Instructions (Signed)
[facility] °Office Phone Number [phone] ° °BREAST BIOPSY/ PARTIAL MASTECTOMY: POST OP INSTRUCTIONS ° °Always review your discharge instruction sheet given to you by the facility where your surgery was performed. ° °IF YOU HAVE DISABILITY OR FAMILY LEAVE FORMS, YOU MUST BRING THEM TO THE OFFICE FOR PROCESSING.  DO NOT GIVE THEM TO YOUR DOCTOR. ° °1. A prescription for pain medication may be given to you upon discharge.  Take your pain medication as prescribed, if needed.  If narcotic pain medicine is not needed, then you may take acetaminophen (Tylenol) or ibuprofen (Advil) as needed. °2. Take your usually prescribed medications unless otherwise directed °3. If you need a refill on your pain medication, please contact your pharmacy.  They will contact our office to request authorization.  Prescriptions will not be filled after 5pm or on week-ends. °4. You should eat very light the first 24 hours after surgery, such as soup, crackers, pudding, etc.  Resume your normal diet the day after surgery. °5. Most patients will experience some swelling and bruising in the breast.  Ice packs and a good support bra will help.  Swelling and bruising can take several days to resolve.  °6. It is common to experience some constipation if taking pain medication after surgery.  Increasing fluid intake and taking a stool softener will usually help or prevent this problem from occurring.  A mild laxative (Milk of Magnesia or Miralax) should be taken according to package directions if there are no bowel movements after 48 hours. °7. Unless discharge instructions indicate otherwise, you may remove your bandages 24-48 hours after surgery, and you may shower at that time.  You may have steri-strips (small skin tapes) in place directly over the incision.  These strips should be left on the skin for 7-10 days.  If your surgeon used skin glue on the incision, you may shower in 24 hours.  The glue will flake off over the  next 2-3 weeks.  Any sutures or staples will be removed at the office during your follow-up visit. °8. ACTIVITIES:  You may resume regular daily activities (gradually increasing) beginning the next day.  Wearing a good support bra or sports bra minimizes pain and swelling.  You may have sexual intercourse when it is comfortable. °a. You may drive when you no longer are taking prescription pain medication, you can comfortably wear a seatbelt, and you can safely maneuver your car and apply brakes. °b. RETURN TO WORK:  ______________________________________________________________________________________ °9. You should see your doctor in the office for a follow-up appointment approximately two weeks after your surgery.  Your doctor’s nurse will typically make your follow-up appointment when she calls you with your pathology report.  Expect your pathology report 2-3 business days after your surgery.  You may call to check if you do not hear from us after three days. °10. OTHER INSTRUCTIONS: _______________________________________________________________________________________________ _____________________________________________________________________________________________________________________________________ °_____________________________________________________________________________________________________________________________________ °_____________________________________________________________________________________________________________________________________ ° °WHEN TO CALL YOUR DOCTOR: °1. Fever over 101.0 °2. Nausea and/or vomiting. °3. Extreme swelling or bruising. °4. Continued bleeding from incision. °5. Increased pain, redness, or drainage from the incision. ° °The clinic staff is available to answer your questions during regular business hours.  Please don’t hesitate to call and ask to speak to one of the nurses for clinical concerns.  If you have a medical emergency, go to the nearest  emergency room or call 911.  A surgeon from [facility] is always on call at the hospital. ° °For further questions, please visit [email or web address]  ° ° ° °  Post Anesthesia Home Care Instructions ° °Activity: °Get plenty of rest for the remainder of the day. A responsible adult should stay with you for 24 hours following the procedure.  °For the next 24 hours, DO NOT: °-Drive a car °-Operate machinery °-Drink alcoholic beverages °-Take any medication unless instructed by your physician °-Make any legal decisions or sign important papers. ° °Meals: °Start with liquid foods such as gelatin or soup. Progress to regular foods as tolerated. Avoid greasy, spicy, heavy foods. If nausea and/or vomiting occur, drink only clear liquids until the nausea and/or vomiting subsides. Call your physician if vomiting continues. ° °Special Instructions/Symptoms: °Your throat may feel dry or sore from the anesthesia or the breathing tube placed in your throat during surgery. If this causes discomfort, gargle with warm salt water. The discomfort should disappear within 24 hours. ° °If you had a scopolamine patch placed behind your ear for the management of post- operative nausea and/or vomiting: ° °1. The medication in the patch is effective for 72 hours, after which it should be removed.  Wrap patch in a tissue and discard in the trash. Wash hands thoroughly with soap and water. °2. You may remove the patch earlier than 72 hours if you experience unpleasant side effects which may include dry mouth, dizziness or visual disturbances. °3. Avoid touching the patch. Wash your hands with soap and water after contact with the patch. °  ° °

## 2014-12-17 NOTE — Anesthesia Preprocedure Evaluation (Signed)
Anesthesia Evaluation  Patient identified by MRN, date of birth, ID band Patient awake    Reviewed: Allergy & Precautions, NPO status , Patient's Chart, lab work & pertinent test results  Airway Mallampati: II  TM Distance: >3 FB Neck ROM: Full    Dental no notable dental hx.    Pulmonary asthma ,    Pulmonary exam normal breath sounds clear to auscultation       Cardiovascular hypertension, Normal cardiovascular exam Rhythm:Regular Rate:Normal     Neuro/Psych negative neurological ROS  negative psych ROS   GI/Hepatic Neg liver ROS, GERD  Medicated,  Endo/Other  diabetesMorbid obesity  Renal/GU negative Renal ROS  negative genitourinary   Musculoskeletal negative musculoskeletal ROS (+)   Abdominal   Peds negative pediatric ROS (+)  Hematology negative hematology ROS (+)   Anesthesia Other Findings   Reproductive/Obstetrics negative OB ROS                             Anesthesia Physical Anesthesia Plan  ASA: III  Anesthesia Plan: General   Post-op Pain Management:    Induction: Intravenous  Airway Management Planned: LMA  Additional Equipment:   Intra-op Plan:   Post-operative Plan: Extubation in OR  Informed Consent: I have reviewed the patients History and Physical, chart, labs and discussed the procedure including the risks, benefits and alternatives for the proposed anesthesia with the patient or authorized representative who has indicated his/her understanding and acceptance.   Dental advisory given  Plan Discussed with: CRNA and Surgeon  Anesthesia Plan Comments:         Anesthesia Quick Evaluation

## 2014-12-17 NOTE — Anesthesia Postprocedure Evaluation (Signed)
Anesthesia Post Note  Patient: Darlene Mclaughlin  Procedure(s) Performed: Procedure(s) (LRB): RIGHT BREAST RADIOACTIVE SEED GUIDED PARTIAL MASTECTOMY (Right)  Anesthesia type: general  Patient location: PACU  Post pain: Pain level controlled  Post assessment: Patient's Cardiovascular Status Stable  Last Vitals:  Filed Vitals:   12/17/14 1246  BP: 147/75  Pulse: 68  Temp: 36.6 C  Resp: 18    Post vital signs: Reviewed and stable  Level of consciousness: sedated  Complications: No apparent anesthesia complications

## 2014-12-17 NOTE — Anesthesia Procedure Notes (Signed)
Procedure Name: LMA Insertion Date/Time: 12/17/2014 10:12 AM Performed by: Toula Moos L Pre-anesthesia Checklist: Patient identified, Emergency Drugs available, Suction available and Patient being monitored Patient Re-evaluated:Patient Re-evaluated prior to inductionOxygen Delivery Method: Circle System Utilized Preoxygenation: Pre-oxygenation with 100% oxygen Intubation Type: IV induction Ventilation: Mask ventilation without difficulty LMA: LMA inserted LMA Size: 4.0 Number of attempts: 1 Airway Equipment and Method: Bite block Placement Confirmation: positive ETCO2 Tube secured with: Tape Dental Injury: Teeth and Oropharynx as per pre-operative assessment

## 2014-12-17 NOTE — Op Note (Signed)
Preoperative diagnosis: DCIS right breast   Postoperative diagnosis: Same   Procedure: RIGHT  breast seed localized lumpectomy   Surgeon: Erroll Luna M.D.    Anesthesia: LMA with 0.25 % local with epinephrine    EBL: 20 cc    Specimen: Right  breast mass with clip and seed to pathology  Drains: None    Indications for procedure: Patient presents for treatment of her right breast DCIS . She has opted for breast conservation after lengthy discussion of treatment options to include breast conservation surgery and mastectomy and reconstruction. Risks, benefits and alternatives discussed with the patient.The procedure has been discussed with the patient. Alternatives to surgery have been discussed with the patient. Risks of surgery include bleeding, Infection, Seroma formation, death, and the need for further surgery. The patient understands and wishes to proceed.Sentinel lymph node mapping and dissection has been discussed with the patient. Risk of bleeding, Infection, Seroma formation, Additional procedures,, Shoulder weakness , Shoulder stiffness, Nerve and blood vessel injury and reaction to the mapping dyes have been discussed. Alternatives to surgery have been discussed with the patient. The patient agrees to proceed.   Description of procedure: Patient underwent placement of RIGHT  breast need a radiology earlier in the week. She presents to the holding area and questions are answered. Neoprobe was used to verify clip placement in the left breast.  Questions answered. Patient taken back to operating room and placed supine on the operating room table. Patient received 2 g of Ancef. After induction of LMA anesthesia RIGHT breast was prepped and draped in a sterile fashion.  Neoprobe was used to identify the radioactive seen in the RIGHT CENTRAL quadrant. Curvilinear incision made AROUND THE NAC  and dissection was carried around to excise all tissue around both the clip and seed. Radiograph  showed the mass,  Seed and clip with gross negative margins. Both seed  and clip were in the specimen. Specimen sent to pathology.  Wound  was irrigated and closed with 3-0 Vicryl and 4-0 Monocryl.Clips placed into the cavity. Liquid adhesive  applied. All final counts sponge, needle instruments found to be correct at this point. Patient awoke, taken to recovery in satisfactory condition.

## 2014-12-17 NOTE — Transfer of Care (Signed)
Immediate Anesthesia Transfer of Care Note  Patient: Darlene Mclaughlin  Procedure(s) Performed: Procedure(s): RIGHT BREAST RADIOACTIVE SEED GUIDED PARTIAL MASTECTOMY (Right)  Patient Location: PACU  Anesthesia Type:General  Level of Consciousness: awake and oriented  Airway & Oxygen Therapy: Patient Spontanous Breathing and Patient connected to face mask oxygen  Post-op Assessment: Report given to RN and Post -op Vital signs reviewed and stable  Post vital signs: Reviewed and stable  Last Vitals:  Filed Vitals:   12/17/14 0842  BP: 131/67  Pulse: 68  Temp: 36.9 C  Resp: 18    Complications: No apparent anesthesia complications

## 2014-12-17 NOTE — H&P (View-Only) (Signed)
Darlene Mclaughlin 11/22/2014 9:52 AM Location: [facility] Patient #: [id number] DOB: 05/03/1950 Widowed / Language: Darlene Mclaughlin / Race: Black or African American Female History of Present Illness [name] A. Cledith Kamiya MD; 11/22/2014 12:24 PM) Patient words: right breast DCIS  Pt sent at the request of Dr Shelly Bombard for abnormal microcalcifications on her mammogram on th right. Core bx showed DCIS. Cluster appears small after viewing on mammogram. Area is lower to outer quadrant. Pt denies mass, discharge or pain.        CLINICAL DATA: Patient returns today to evaluate right breast calcifications identified on a recent screening mammogram.  EXAM: DIGITAL DIAGNOSTIC RIGHT MAMMOGRAM  COMPARISON: Previous exams including recent screening mammogram dated 11/05/2014.  ACR Breast Density Category b: There are scattered areas of fibroglandular density.  FINDINGS: On today's additional views with magnification, grouped punctate and amorphous calcifications are confirmed within the lower outer quadrant of the right breast, 6-7 o'clock axis, at middle to posterior depth.  IMPRESSION: Grouped punctate and amorphous calcifications confirmed within the lower outer quadrant of the right breast, 6-7 o'clock axis region, for which stereotactic biopsy is recommended.           ADDITIONAL INFORMATION: PROGNOSTIC INDICATORS Results: IMMUNOHISTOCHEMICAL AND MORPHOMETRIC ANALYSIS PERFORMED MANUALLY Estrogen Receptor: 100%, POSITIVE, STRONG STAINING INTENSITY Progesterone Receptor: 90%, POSITIVE, STRONG STAINING INTENSITY REFERENCE RANGE ESTROGEN RECEPTOR NEGATIVE 0% POSITIVE =>1% REFERENCE RANGE PROGESTERONE RECEPTOR NEGATIVE 0% POSITIVE =>1% All controls stained appropriately Enid Cutter MD Pathologist, Electronic Signature ( Signed 11/19/2014) FINAL DIAGNOSIS Diagnosis Breast, right, needle core biopsy, lower outer - DUCTAL CARCINOMA IN SITU WITH CALCIFICATIONS. -  FIBROADENOMA. - SEE COMMENT. 1 of 2 FINAL for Darlene, Mclaughlin [phone]) Microscopic Comment The carcinoma appears intermediate grade. Estrogen receptor and progesterone receptor studies will be performed and the results reported separately. (JBK:gt, 11/18/14) Enid Cutter MD Pathologist, Electronic Signature (Case signed 11/18/2014) Specimen Gross and Clinical Information Specimen Comment In formalin 9:35; right breast calcs Specimen(s) Obtained: Breast, right, needle core biopsy, lower outer Specimen Clinical Information FCC/fibroadenoma vs. DCIS Gross TIF is 9:35 a.m. on 11/15/14, the CIT is not provided. Received in formalin within a coretainer is a 2.8 x 2.8 x 1.4 cm aggregate of soft, yellow white fibrofatty tissue. The specimen is entirely submitted in three blocks. (TB:kh 11-15-14) Stain(s) used in Diagnosis: The following stain(s) were used in diagnosing the case: PR-ACIS, ER-ACIS. The control(s) stained appropriately. Disclaimer Estrogen receptor (SP1), immunohistochemical stains are performed on formalin fixed, paraffin embedded tissue using a 3,3"-diaminobenzidine (DAB) chromogen and DAKO Autostainer System. The staining intensity of the nucleus is scored morphometrically using the Automated Cellular Imaging System (ACIS) and is reported as the percentage of tumor cell nuclei demonstrating specific nuclear staining. PR progesterone receptor (PgR 636), immunohistochemical stains are performed on formalin fixed, paraffin embedded tissue using a 3,3"-diaminobenzidine (DAB) chromogen and DAKO Autostainer System. The staining intensity of the nucleus is scored morphometrically using the Automated Cellular Imaging System (ACIS) and is reported as the percentage of tumor cell nuclei demonstrating specific nuclear staining. Report signed out from the following location(s) Technical Component was performed at [facility]. [address],[place] [address].[id number]., Technical component and interpretation was performed at [facility] [address], [place], [place] [address]. CLIA #: [id number], 2 of.  The patient is a 64 year old female   Other Problems Marjean Donna, CMA; 11/22/2014 9:52 AM) Arthritis Asthma Back Pain Breast Cancer Diabetes Mellitus Hemorrhoids High blood pressure Kidney Stone Lump In Breast  Past Surgical History Marjean Donna, CMA; 11/22/2014 9:52 AM) Breast Biopsy Right. Cesarean Section - Multiple Hysterectomy (due to cancer) - Partial Knee Surgery Left. Sentinel Lymph Node Biopsy  Diagnostic Studies History Marjean Donna, CMA; 11/22/2014 9:52 AM) Colonoscopy 5-10 years ago Mammogram 1-3 years ago  Allergies Marjean Donna, CMA; 11/22/2014 9:54 AM) Macrobid *URINARY ANTI-INFECTIVES* Naproxen *ANALGESICS - ANTI-INFLAMMATORY* Zanaflex *MUSCULOSKELETAL THERAPY AGENTS*  Medication History (Sonya Bynum, CMA; 11/22/2014 9:55 AM) Chlorthalidone (25MG  Tablet, Oral) Active. Fluticasone Propionate (50MCG/ACT Suspension, Nasal) Active. Klor-Con M20 [facility] Tablet ER, Oral) Active. MetFORMIN HCl ER (500MG  Tablet ER 24HR, Oral) Active. ProAir HFA (108 (90 Base)MCG/ACT Aerosol Soln, Inhalation) Active. TiZANidine HCl (4MG  Tablet, Oral) Active. Pantoprazole Sodium (40MG  Tablet DR, Oral) Active. TraMADol HCl (50MG  Tablet, Oral as needed) Active. EpiPen 2-Pak (0.3MG /0.3ML Soln Auto-inj, Injection) Active. Medications Reconciled  Social History Marjean Donna, CMA; 11/22/2014 9:52 AM) Alcohol use Occasional alcohol use. Caffeine use Carbonated beverages, Coffee, Tea. No drug use Tobacco use Never smoker.  Family History Marjean Donna, [place]; 11/22/2014 9:52 AM) Alcohol Abuse Brother. Arthritis Mother. Breast Cancer Mother. Depression Brother. Diabetes Mellitus Brother, Mother. Heart Disease Brother. Hypertension Brother,  Mother. Malignant Neoplasm Of Pancreas Brother.  Pregnancy / Birth History Marjean Donna, [place]; 11/22/2014 9:52 AM) Age at menarche 31 years. Gravida 2 Maternal age 64-25 Para 2 Regular periods     Review of Systems ([place]; 11/22/2014 9:52 AM) General Present- Fatigue. Not Present- Appetite Loss, Chills, Fever, Night Sweats, Weight Gain and Weight Loss. Skin Present- Dryness. Not Present- Change in Wart/Mole, Hives, Jaundice, New Lesions, Non-Healing Wounds, Rash and Ulcer. HEENT Present- Seasonal Allergies and Wears glasses/contact lenses. Not Present- Earache, Hearing Loss, Hoarseness, Nose Bleed, Oral Ulcers, Ringing in the Ears, Sinus Pain, Sore Throat, Visual Disturbances and Yellow Eyes. Respiratory Not Present- Bloody sputum, Chronic Cough, Difficulty Breathing, Snoring and Wheezing. Breast Not Present- Breast Mass, Breast Pain, Nipple Discharge and Skin Changes. Cardiovascular Present- Leg Cramps, Palpitations and Swelling of Extremities. Not Present- Chest Pain, Difficulty Breathing Lying Down, Rapid Heart Rate and Shortness of Breath. Gastrointestinal Present- Bloating, Constipation, Excessive gas, Hemorrhoids and Indigestion. Not Present- Abdominal Pain, Bloody Stool, Change in Bowel Habits, Chronic diarrhea, Difficulty Swallowing, Gets full quickly at meals, Nausea, Rectal Pain and Vomiting. Female Genitourinary Present- Nocturia and Urgency. Not Present- Frequency, Painful Urination and Pelvic Pain. Musculoskeletal Present- Back Pain, Joint Pain, Joint Stiffness, Muscle Pain, Muscle Weakness and Swelling of Extremities. Neurological Present- Numbness, Trouble walking and Weakness. Not Present- Decreased Memory, Fainting, Headaches, Seizures, Tingling and Tremor. Psychiatric Not Present- Anxiety, Bipolar, Change in Sleep Pattern, Depression, Fearful and Frequent crying. Endocrine Present- Cold Intolerance and New Diabetes. Not Present- Excessive Hunger, Hair Changes,  Heat Intolerance and Hot flashes. Hematology Not Present- Easy Bruising, Excessive bleeding, Gland problems, HIV and Persistent Infections.  Vitals (Sonya Bynum CMA; 11/22/2014 9:53 AM) 11/22/2014 9:53 AM Weight: 261 lb Height: 65in Body Surface Area: 2.33 m Body Mass Index: 43.43 kg/m Temp.: 97.77F(Temporal)  Pulse: 77 (Regular)  BP: 138/78 (Sitting, Left Arm, Standard)     Physical Exam (Gunda Maqueda A. Conal Shetley MD; 11/22/2014 12:22 PM)  General Mental Status-Alert. General Appearance-Consistent with stated age. Hydration-Well hydrated. Voice-Normal.  Head and Neck Head-normocephalic, atraumatic with no lesions or palpable masses. Trachea-midline. Thyroid Gland Characteristics - normal size and consistency.  Eye Eyeball - Bilateral-Extraocular movements intact. Sclera/Conjunctiva - Bilateral-No scleral icterus.  Chest and Lung Exam Chest and lung exam reveals -quiet, even and easy respiratory effort with no use of accessory muscles and on  auscultation, normal breath sounds, no adventitious sounds and normal vocal resonance. Inspection Chest Wall - Normal. Back - normal.  Breast Breast - Left-Symmetric, Non Tender, No Biopsy scars, no Dimpling, No Inflammation, No Lumpectomy scars, No Mastectomy scars, No Peau d' Orange. Breast - Right-Symmetric, Non Tender, No Biopsy scars, no Dimpling, No Inflammation, No Lumpectomy scars, No Mastectomy scars, No Peau d' Orange. Breast Lump-No Palpable Breast Mass.  Cardiovascular Cardiovascular examination reveals -normal heart sounds, regular rate and rhythm with no murmurs and normal pedal pulses bilaterally.  Abdomen Inspection Inspection of the abdomen reveals - No Hernias. Skin - Scar - no surgical scars. Palpation/Percussion Palpation and Percussion of the abdomen reveal - Soft, Non Tender, No Rebound tenderness, No Rigidity (guarding) and No hepatosplenomegaly. Auscultation Auscultation of  the abdomen reveals - Bowel sounds normal.  Neurologic Neurologic evaluation reveals -alert and oriented x 3 with no impairment of recent or remote memory. Mental Status-Normal.  Musculoskeletal Normal Exam - Left-Upper Extremity Strength Normal and Lower Extremity Strength Normal. Normal Exam - Right-Upper Extremity Strength Normal and Lower Extremity Strength Normal.  Lymphatic Head & Neck  General Head & Neck Lymphatics: Bilateral - Description - Normal. Axillary  General Axillary Region: Bilateral - Description - Normal. Tenderness - Non Tender. Femoral & Inguinal  Generalized Femoral & Inguinal Lymphatics: Bilateral - Description - Normal. Tenderness - Non Tender.    Assessment & Plan (Nazareth Norenberg A. Yosselin Zoeller MD; 11/22/2014 12:24 PM)  BREAST NEOPLASM, TIS (DCIS), RIGHT (D05.11) Impression: discussed mastectomy with reconstruction vs patial mastectomy discussed other treatments and the need for more surgery she would like to conserve her breast at this point she has opted for right breast seed localized lumpectomy she understands she may need radiation treatment or may need more surgery Risk of lumpectomy include bleeding, infection, seroma, more surgery, use of seed/wire, wound care, cosmetic deformity and the need for other treatments, death , blood clots, death. Pt agrees to proceed.  Current Plans Pt Education - [facility] Breast Cancer Information Given - [facility] "Breast Journey" Package Pt Education - [facility] Breast Biopsy HCI: discussed with patient and provided information. Pt Education - Patient education: Ductal carcinoma in situ (DCIS) (The Basics): discussed with patient and provided information.   The anatomy and the physiology was discussed. The pathophysiology and natural history of the disease was discussed. Options were discussed and recommendations were made. Technique, risks, benefits, & alternatives were discussed. Risks such as stroke, heart attack, bleeding,  indection, death, and other risks discussed. Questions answered. The patient agrees to proceed.

## 2014-12-18 ENCOUNTER — Encounter (HOSPITAL_BASED_OUTPATIENT_CLINIC_OR_DEPARTMENT_OTHER): Payer: Self-pay | Admitting: Surgery

## 2014-12-23 ENCOUNTER — Other Ambulatory Visit: Payer: Self-pay | Admitting: Internal Medicine

## 2014-12-27 ENCOUNTER — Telehealth: Payer: Self-pay | Admitting: Hematology

## 2014-12-27 ENCOUNTER — Ambulatory Visit (HOSPITAL_BASED_OUTPATIENT_CLINIC_OR_DEPARTMENT_OTHER): Payer: Medicare Other | Admitting: Hematology

## 2014-12-27 VITALS — BP 151/49 | HR 74 | Temp 98.5°F | Resp 18 | Ht 65.0 in | Wt 259.4 lb

## 2014-12-27 DIAGNOSIS — D0511 Intraductal carcinoma in situ of right breast: Secondary | ICD-10-CM | POA: Diagnosis not present

## 2014-12-27 DIAGNOSIS — I10 Essential (primary) hypertension: Secondary | ICD-10-CM | POA: Diagnosis not present

## 2014-12-27 DIAGNOSIS — E119 Type 2 diabetes mellitus without complications: Secondary | ICD-10-CM

## 2014-12-27 DIAGNOSIS — C50511 Malignant neoplasm of lower-outer quadrant of right female breast: Secondary | ICD-10-CM

## 2014-12-27 DIAGNOSIS — Z170 Estrogen receptor positive status [ER+]: Secondary | ICD-10-CM

## 2014-12-27 NOTE — Telephone Encounter (Signed)
per pof to sch pt appt-gave pt copy of avs °

## 2014-12-27 NOTE — Progress Notes (Signed)
[place] NOTE  Patient Care Team: Marletta Lor, MD as PCP - General Erroll Luna, MD as Consulting Physician (General Surgery)  CHIEF COMPLAINTS/PURPOSE OF CONSULTATION:  Right breast DCIS   HISTORY OF PRESENTING ILLNESS:  Darlene Mclaughlin 64 y.o. female is here because of recent diagnosis of right breast DCIS.   The cancer was detected by screening mammogram. She does mammogram every year. The cancer was not palpable prior to diagnosis. She denies any other new symptoms.  She has chronic back pain, and had epidural injection twice which helped a lot. She also has chronic arthritis, especially knees and feet, she takes tramadol for both back and knee pain, she takes 0-1tab/day.  She denies any other symptoms. She has good appetite and eats well. She has good energy level. She is widowed, lives alone and very independent. She has 2 sons and 3 grandchildren, and she sees him often.   SUMMARY OF ONCOLOGIC HISTORY: Oncology History   Breast cancer of lower-outer quadrant of right female breast [facility])   Staging form: Breast, AJCC 7th Edition     Clinical stage from 11/15/2014: Stage 0 (Tis (DCIS), N0, M0) - Signed by Truitt Merle, MD on 11/26/2014       Breast cancer of lower-outer quadrant of right female breast ([place])   11/06/2014 Mammogram Screening mammogram showed calcifications in the lower outer quadrant of the right breast, which was confirmed by diagnostic mammogram   11/15/2014 Initial Biopsy Breast core needle biopsy showed ductal carcinoma in situ with calcifications.   11/15/2014 Initial Diagnosis Breast cancer of lower-outer quadrant of right female breast ([place])   11/15/2014 Receptors her2 ER 100% positive, PR 90% positive   12/17/2014 Surgery Right breast lumpectomy.   12/17/2014 Pathology Results Right breast lumpectomy showed usual ductal Hyperplasia and fibroadenoma. No residual ductal carcinoma in situ.     In terms of breast cancer risk  profile:  She menarched at early age of 45 and went to menopause at age 42 (hysrectomy)  She had 2 pregnancy and 2 children, her first child was born at age 47 She did not breast-fed her child.  She received birth control pills for approximately 5 years  She was never exposed to fertility medications or hormone replacement therapy.  She has family history of Breast cancer (mother)  INTERIM HISTORY Josphine returns for follow-up. She underwent right breast lumpectomy on 12/17/2014. She tolerated surgery very well, has minimal residual pain at the incision site. She has recovered well from surgery. Her chronic back pain and arthritis are stable. No other new complaints.  MEDICAL HISTORY:  Past Medical History  Diagnosis Date  . ALLERGIC RHINITIS 05/15/2008  . ASTHMA 05/15/2008  . Headache(784.0) 05/15/2008  . HYPERTENSION 05/15/2008  . IMPAIRED GLUCOSE TOLERANCE 05/15/2008  . Obesity   . Diabetes mellitus without complication ([place])   . GERD (gastroesophageal reflux disease)   . OSTEOARTHRITIS 05/15/2008    back  . Cancer [facility])     DCIS right breast    SURGICAL HISTORY: Past Surgical History  Procedure Laterality Date  . Cesarean section    . Abdominal hysterectomy    . Knee surgery      arthroscopic left  . Breast lumpectomy with radioactive seed localization Right 12/17/2014    Procedure: RIGHT BREAST RADIOACTIVE SEED GUIDED PARTIAL MASTECTOMY;  Surgeon: Erroll Luna, MD;  Location: [facility];  Service: General;  Laterality: Right;    SOCIAL HISTORY: Social History   Social History  .  Marital Status: Widowed    Spouse Name: N/A  . Number of Children: N/A  . Years of Education: N/A   Occupational History  . Retired    Social History Main Topics  . Smoking status: Never Smoker   . Smokeless tobacco: Never Used  . Alcohol Use: Yes     Comment: RARELY  . Drug Use: No  . Sexual Activity: Not on file   Other Topics Concern  . Not on file   Social  History Narrative    FAMILY HISTORY: Family History  Problem Relation Age of Onset  . Diabetes Mother   . Hypertension Mother   . Cancer Mother 21    Breast cancer twice   . Hyperlipidemia Father   . Cancer Brother     pancreatic cancer   . Diabetes Brother     ALLERGIES:  is allergic to macrobid; naproxen; and zanaflex.  MEDICATIONS:  Current Outpatient Prescriptions  Medication Sig Dispense Refill  . acetaminophen (TYLENOL) 650 MG CR tablet Take 650 mg by mouth every 8 (eight) hours as needed.      Marland Kitchen aspirin 81 MG tablet Take 81 mg by mouth daily.      . chlorthalidone (HYGROTON) 25 MG tablet TAKE 1 TABLET (25 MG TOTAL) BY MOUTH DAILY. 90 tablet 1  . EPIPEN 2-PAK 0.3 MG/0.3ML SOAJ injection 0.3 mg.   1  . fluticasone (FLONASE) 50 MCG/ACT nasal spray Place 1 spray into both nostrils daily. 16 g 5  . glucose blood (FREESTYLE LITE) test strip 1 each by Other route 2 (two) times daily. Use as instructed 100 each 12  . KLOR-CON M20 20 MEQ tablet TAKE 1 TABLET DAILY. 90 tablet 1  . metFORMIN (GLUCOPHAGE-XR) 500 MG 24 hr tablet TAKE 2 TABLETS (1,000 MG TOTAL) BY MOUTH DAILY WITH SUPPER. 180 tablet 4  . Multiple Vitamin (MULTIVITAMIN) tablet Take 1 tablet by mouth daily.      . NON FORMULARY WEEKLY ALLERGY INJECTIONS     . oxyCODONE-acetaminophen (ROXICET) 5-325 MG tablet Take 1 tablet by mouth every 4 (four) hours as needed. 30 tablet 0  . pantoprazole (PROTONIX) 40 MG tablet Take 1 tablet (40 mg total) by mouth daily. 90 tablet 3  . PROAIR HFA 108 (90 BASE) MCG/ACT inhaler Inhale 1 puff into the lungs every 6 (six) hours as needed.   0  . traMADol (ULTRAM) 50 MG tablet Take 1 tablet (50 mg total) by mouth every 6 (six) hours as needed. for pain 50 tablet 3   No current facility-administered medications for this visit.    REVIEW OF SYSTEMS:   Constitutional: Denies fevers, chills or abnormal night sweats Eyes: Denies blurriness of vision, double vision or watery eyes Ears, nose,  mouth, throat, and face: Denies mucositis or sore throat Respiratory: Denies cough, dyspnea or wheezes Cardiovascular: Denies palpitation, chest discomfort or lower extremity swelling Gastrointestinal:  Denies nausea, heartburn or change in bowel habits Skin: Denies abnormal skin rashes Lymphatics: Denies new lymphadenopathy or easy bruising Neurological:Denies numbness, tingling or new weaknesses Behavioral/Psych: Mood is stable, no new changes  All other systems were reviewed with the patient and are negative.  PHYSICAL EXAMINATION: ECOG PERFORMANCE STATUS: 1 - Symptomatic but completely ambulatory  Filed Vitals:   12/27/14 1038  BP: 151/49  Pulse: 74  Temp: 98.5 F (36.9 C)  Resp: 18   Filed Weights   12/27/14 1038  Weight: 259 lb 6.4 oz (117.663 kg)    GENERAL:alert, no distress and comfortable SKIN: skin  color, texture, turgor are normal, no rashes or significant lesions EYES: normal, conjunctiva are pink and non-injected, sclera clear OROPHARYNX:no exudate, no erythema and lips, buccal mucosa, and tongue normal  NECK: supple, thyroid normal size, non-tender, without nodularity LYMPH:  no palpable lymphadenopathy in the cervical, axillary or inguinal LUNGS: clear to auscultation and percussion with normal breathing effort HEART: regular rate & rhythm and no murmurs and no lower extremity edema ABDOMEN:abdomen soft, non-tender and normal bowel sounds Musculoskeletal:no cyanosis of digits and no clubbing  PSYCH: alert & oriented x 3 with fluent speech NEURO: no focal motor/sensory deficits Breasts: Breast inspection showed them to be symmetrical with no nipple discharge. Right breast incision site is clean and healing well.  Palpation of the left breast and axilla revealed no obvious mass that I could appreciate.   LABORATORY DATA:  I have reviewed the data as listed Lab Results  Component Value Date   WBC 8.0 12/11/2014   HGB 14.5 12/11/2014   HCT 42.0 12/11/2014    MCV 97.2 12/11/2014   PLT 192 12/11/2014   Lab Results  Component Value Date   NA 139 12/11/2014   K 3.4* 12/11/2014   CL 99* 12/11/2014   CO2 28 12/11/2014    PATHOLOGY REPORT Diagnosis 11/15/2014 Breast, right, needle core biopsy, lower outer - DUCTAL CARCINOMA IN SITU WITH CALCIFICATIONS. - FIBROADENOMA. - SEE COMMENT. Results: IMMUNOHISTOCHEMICAL AND MORPHOMETRIC ANALYSIS PERFORMED MANUALLY Estrogen Receptor: 100%, POSITIVE, STRONG STAINING INTENSITY Progesterone Receptor: 90%, POSITIVE, STRONG STAINING INTENSITY  Diagnosis 12/17/2014 Breast, lumpectomy, Right USUAL DUCTAL HYPERPLASIA AND FIBROADENOMA BIOPSY SITE CHANGES NO RESIDUAL DUCTAL CARCINOMA IN SITU IS IDENTIFIED  RADIOGRAPHIC STUDIES: I have personally reviewed the radiological images as listed and agreed with the findings in the report.  Mm Digital Diagnostic Unilat R 11/12/2014      IMPRESSION: Grouped punctate and amorphous calcifications confirmed within the lower outer quadrant of the right breast, 6-7 o'clock axis region, for which stereotactic biopsy is recommended.  RECOMMENDATION: Stereotactic biopsy, possibly with tomosynthesis guidance, for the right breast calcifications.  Stereotactic biopsy is scheduled for September 23rd.  I have discussed the findings and recommendations with the patient. Results were also provided in writing at the conclusion of the visit. If applicable, a reminder letter will be sent to the patient regarding the next appointment.  BI-RADS CATEGORY  4: Suspicious.   Electronically Signed   By: Franki Cabot M.D.   On: 11/12/2014 12:09   Mm Digital Screening Bilateral 11/06/2014   ACR Breast Density Category b: There are scattered areas of fibroglandular density.  FINDINGS: In the right breast, calcifications warrant further evaluation with magnified views. In the left breast, no findings suspicious for malignancy. Images were processed with CAD.  IMPRESSION: Further evaluation is suggested  for calcifications in the right breast.  RECOMMENDATION: Diagnostic mammogram of the right breast. (Code:[id number])  The patient will be contacted regarding the findings, and additional imaging will be scheduled.  BI-RADS CATEGORY  0: Incomplete. Need additional imaging evaluation and/or prior mammograms for comparison.   Electronically Signed   By: Nolon Nations M.D.   On: 11/06/2014 10:27     ASSESSMENT: 12 -year-old African-American female, with past medical history of hypertension, IgG, arthritis, chronic back pain, obesity, and the well-controlled asthma, who was found to have right breast DCIS by screening mammogram.  PLAN:  #1 right breast DCIS, ER/PR positive, no residual tumor on surgical path -I reviewed her surgical pathology results, which showed no residual malignant cells.The patient had  early stage disease. She  Is cured.  -Any form of adjuvant treatment is for prevention of disease recurrence.  -Giving a strong ER/PR positivity of her tumor, I recommend chemoprevention with tamoxifen or anastrozole. She does have moderate arthritis, and has had hysterectomy in the past, no risk for endometrial cancer, I recommended tamoxifen. The potential side effects, including hot flash, skiing and vaginal dryness, slight increase of cardiovascular disease, thrombosis, were discussed with her again today.  she is interested and agrees to proceed after she completes adjuvant radiation  -She is scheduled to see Dr. Valere Dross again to start breast irradiation  -We discussed breast cancer surveillance, she is agreeable to continue annual screening mammogram. -I encouraged her to eat healthy and accessed regularly. She is not a very active due to the back pain and arthritis, I also encouraged her to consider weight loss, she is a motivated.  #2 Bone health Her last DEXA scan was 2011, will repeat one in the next few month   #3 arthritis, hypertension, diabetes -She'll continue follow-up with her  primary care physician  Plan -I'll see her back after she completes breast irradiation.   All questions were answered. The patient knows to call the clinic with any problems, questions or concerns. I spent 20 minutes counseling the patient face to face. The total time spent in the appointment was 30 minutes and more than 50% was on counseling.     Truitt Merle, MD 12/28/2014 11:59 AM

## 2014-12-28 ENCOUNTER — Encounter: Payer: Self-pay | Admitting: Hematology

## 2014-12-30 NOTE — Progress Notes (Signed)
Location of Breast Cancer: Right Breast, lower outer  Histology per Pathology Report:   12/17/14 Diagnosis Breast, lumpectomy, Right USUAL DUCTAL HYPERPLASIA AND FIBROADENOMA BIOPSY SITE CHANGES NO RESIDUAL DUCTAL CARCINOMA IN SITU IS IDENTIFIED  Receptor Status: ER(100%R (90%) Her2-neu (neg)  Presentation: Found on mammogram followed by a 3D mammogram  Past/Anticipated interventions by surgeon, if JME:[id number]: surgical pathology results, which showed no residual malignant cells  Past/Anticipated interventions by medical oncology, if any: Chemotherapy Giving a strong ER/PR positivity of her tumor,  recommend chemoprevention with tamoxifen or anastrozole  Lymphedema issues, if any:  None  Pain issues, if any:  Intermittent achy/throbbing pain in her right breast  SAFETY ISSUES:  Prior radiation? No  Pacemaker/ICD? No  Possible current pregnancy?No  Is the patient on methotrexate? No  Current Complaints / other details:   She menarched at early age of 6 and went to menopause at age 57 (hysrectomy)  She had 2 pregnancy and 2 children, her first child was born at age 9 She did not breast-fed her child.  She received birth control pills for approximately 5 years  She was never exposed to fertility medications or hormone replacement therapy.  She has family history of Breast cancer (mother)     Deirdre Evener, RN 12/30/2014,6:17 PM

## 2014-12-31 ENCOUNTER — Encounter: Payer: Self-pay | Admitting: Radiation Oncology

## 2014-12-31 ENCOUNTER — Ambulatory Visit
Admission: RE | Admit: 2014-12-31 | Discharge: 2014-12-31 | Disposition: A | Discharge status: Home / Self Care | Payer: Medicare Other | Source: Ambulatory Visit | Attending: Radiation Oncology | Admitting: Radiation Oncology

## 2014-12-31 ENCOUNTER — Telehealth: Payer: Self-pay | Admitting: *Deleted

## 2014-12-31 VITALS — BP 120/61 | HR 67 | Temp 97.8°F | Ht 65.0 in | Wt 261.1 lb

## 2014-12-31 DIAGNOSIS — C50511 Malignant neoplasm of lower-outer quadrant of right female breast: Secondary | ICD-10-CM | POA: Diagnosis not present

## 2014-12-31 NOTE — Telephone Encounter (Signed)
Called patient to inform of mammogram on 01-30-15- arrival time - 9:30 am @ [facility], spoke with patient and she  Is aware of this test

## 2014-12-31 NOTE — Progress Notes (Signed)
CC: Dr. Erroll Luna, Dr. Truitt Merle, Dr. 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 Heck is a pleasant 64 year old female who is seen today for review in the management of her DCIS of the right breast.  I first saw the patient in consultation on 12/04/2014. At the time of a screening mammogram at [facility] on 11/05/2014 she was felt to have suspicious calcifications within the right breast. Additional views on 11/12/2014 showed a grouped punctate and amorphous calcifications within the lower outer quadrant in the 6 to 7:00 position. Stereotactic biopsy on 11/15/2014 was diagnostic for DCIS, intermediate grade which was strongly ER/PR positive. She underwent a right partial mastectomy on 12/17/2014.  There is no residual DCIS within the surgical specimen.  She was seen in consultation by Dr. Burr Medico who offered her antiestrogen therapy.  She is without complaints today.  Physical examination: Alert and oriented 64 year old black female appearing her stated age. Filed Vitals:   12/31/14 0919  BP: 120/61  Pulse: 67  Temp: 97.8 F (36.6 C)   Nodes: There is no palpable cervical, supraclavicular, or axillary lymphadenopathy.  Breasts: There is a partial mastectomy wound along the periareolar region of the right breast extending from approximately 12 to 5:00.  The wound is healing well.  There is a right breast seroma measuring at least 8cm along the superomedial breast.  Left breast without masses or lesions.  Extremities: Without edema.  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  I explained to the patient that her risk for local recurrence is related to size of DCIS, surgical margins, grade of tumor, and age.  I performed a calculation for the risk of local recurrence using the [facility] nomogram.  With no adjuvant radiation or antiestrogen therapy she has a  16% risk for a local recurrence.  Adding radiation alone would reduce this risk to 6%, adding antiestrogen therapy alone would reduce the risk to 8%, and adding both radiation and antiestrogen therapy would reduce the risk for local recurrence down to 3%.  An advantage of antiestrogen therapy would be to reduce her risk, not only a local recurrence, but also the development of a new neoplasm in either breast.  She is willing to take antiestrogen therapy.  Adding radiation therapy to antiestrogen therapy would only reduce her risk for local recurrence a further 5%, or 1 in 20 patients.  Based on this nomogram, I would feel comfortable with antiestrogen therapy alone.  She will think things over and get back in touch with me if she changes her mind.  Lastly, we briefly discussed the use of Oncotype DX to determine the risk for local recurrence for DCIS, and this is currently experimental and being studied in a national trial.  I will get her scheduled for a postoperative mammogram in December to confirm removal of all suspicious microcalcifications and serve as a baseline study for future examinations.  Plan: As discussed above.  30 minutes was spent face-to-face with the patient, primarily counseling patient and coordinating her care.

## 2015-01-29 ENCOUNTER — Ambulatory Visit (INDEPENDENT_AMBULATORY_CARE_PROVIDER_SITE_OTHER): Payer: Medicare Other | Admitting: Internal Medicine

## 2015-01-29 ENCOUNTER — Encounter: Payer: Self-pay | Admitting: Internal Medicine

## 2015-01-29 VITALS — BP 130/68 | HR 71 | Temp 97.9°F | Resp 20 | Ht 65.0 in | Wt 262.0 lb

## 2015-01-29 DIAGNOSIS — Z23 Encounter for immunization: Secondary | ICD-10-CM

## 2015-01-29 DIAGNOSIS — I10 Essential (primary) hypertension: Secondary | ICD-10-CM | POA: Diagnosis not present

## 2015-01-29 DIAGNOSIS — E119 Type 2 diabetes mellitus without complications: Secondary | ICD-10-CM | POA: Diagnosis not present

## 2015-01-29 LAB — HEMOGLOBIN A1C: Hgb A1c MFr Bld: 6.6 % — ABNORMAL HIGH (ref 4.6–6.5)

## 2015-01-29 NOTE — Addendum Note (Signed)
Addended by: Marian Sorrow on: 01/29/2015 09:14 AM   Modules accepted: Orders

## 2015-01-29 NOTE — Progress Notes (Signed)
Subjective:    Patient ID: Darlene Mclaughlin, female    DOB: August 12, 1950, 64 y.o.   MRN: [id number]  HPI  Lab Results  Component Value Date   HGBA1C 5.2 07/10/2014   Wt Readings from Last 3 Encounters:  01/29/15 262 lb (118.842 kg)  12/31/14 261 lb 1.6 oz (118.434 kg)  12/27/14 259 lb 6.4 oz (117.62 kg)   64 year old patient who is seen today for hypertension and follow-up of her type 2 diabetes.  She continues to do well.  She is followed by oncology.  No concerns or complaints.  She does have obesity which is unchanged.  Past Medical History  Diagnosis Date  . ALLERGIC RHINITIS 05/15/2008  . ASTHMA 05/15/2008  . Headache(784.0) 05/15/2008  . HYPERTENSION 05/15/2008  . IMPAIRED GLUCOSE TOLERANCE 05/15/2008  . Obesity   . Diabetes mellitus without complication ([place])   . GERD (gastroesophageal reflux disease)   . OSTEOARTHRITIS 05/15/2008    back  . Cancer [facility])     DCIS right breast    Social History   Social History  . Marital Status: Widowed    Spouse Name: N/A  . Number of Children: N/A  . Years of Education: N/A   Occupational History  . Retired    Social History Main Topics  . Smoking status: Never Smoker   . Smokeless tobacco: Never Used  . Alcohol Use: Yes     Comment: RARELY  . Drug Use: No  . Sexual Activity: Not on file   Other Topics Concern  . Not on file   Social History Narrative    Past Surgical History  Procedure Laterality Date  . Cesarean section    . Abdominal hysterectomy    . Knee surgery      arthroscopic left  . Breast lumpectomy with radioactive seed localization Right 12/17/2014    Procedure: RIGHT BREAST RADIOACTIVE SEED GUIDED PARTIAL MASTECTOMY;  Surgeon: Erroll Luna, MD;  Location: [place];  Service: General;  Laterality: Right;    Family History  Problem Relation Age of Onset  . Diabetes Mother   . Hypertension Mother   . Cancer Mother 20    Breast cancer twice   . Hyperlipidemia Father   . Cancer  Brother     pancreatic cancer   . Diabetes Brother     Allergies  Allergen Reactions  . Macrobid [Nitrofurantoin Monohyd Macro]      Liver problems  . Naproxen   . Zanaflex [Tizanidine Hcl] Other (See Comments)     Liver problems    Current Outpatient Prescriptions on File Prior to Visit  Medication Sig Dispense Refill  . acetaminophen (TYLENOL) 650 MG CR tablet Take 650 mg by mouth every 8 (eight) hours as needed.      Marland Kitchen aspirin 81 MG tablet Take 81 mg by mouth daily.      . chlorthalidone (HYGROTON) 25 MG tablet TAKE 1 TABLET (25 MG TOTAL) BY MOUTH DAILY. 90 tablet 1  . EPIPEN 2-PAK 0.3 MG/0.3ML SOAJ injection 0.3 mg.   1  . fluticasone (FLONASE) 50 MCG/ACT nasal spray Place 1 spray into both nostrils daily. 16 g 5  . glucose blood (FREESTYLE LITE) test strip 1 each by Other route 2 (two) times daily. Use as instructed 100 each 12  . KLOR-CON M20 20 MEQ tablet TAKE 1 TABLET DAILY. 90 tablet 1  . metFORMIN (GLUCOPHAGE-XR) 500 MG 24 hr tablet TAKE 2 TABLETS (1,000 MG TOTAL) BY MOUTH DAILY WITH SUPPER. 180  tablet 4  . Multiple Vitamin (MULTIVITAMIN) tablet Take 1 tablet by mouth daily.      . NON FORMULARY WEEKLY ALLERGY INJECTIONS     . pantoprazole (PROTONIX) 40 MG tablet Take 1 tablet (40 mg total) by mouth daily. 90 tablet 3  . PROAIR HFA 108 (90 BASE) MCG/ACT inhaler Inhale 1 puff into the lungs every 6 (six) hours as needed.   0  . traMADol (ULTRAM) 50 MG tablet Take 1 tablet (50 mg total) by mouth every 6 (six) hours as needed. for pain 50 tablet 3   No current facility-administered medications on file prior to visit.    BP 130/68 mmHg  Pulse 71  Temp(Src) 97.9 F (36.6 C) (Oral)  Resp 20  Ht 5\' 5"  (1.651 m)  Wt 262 lb (118.842 kg)  BMI 43.60 kg/m2  SpO2 99%     Review of Systems  Constitutional: Negative.   HENT: Negative for congestion, dental problem, hearing loss, rhinorrhea, sinus pressure, sore throat and tinnitus.   Eyes: Negative for pain, discharge and  visual disturbance.  Respiratory: Negative for cough and shortness of breath.   Cardiovascular: Negative for chest pain, palpitations and leg swelling.  Gastrointestinal: Negative for nausea, vomiting, abdominal pain, diarrhea, constipation, blood in stool and abdominal distention.  Genitourinary: Negative for dysuria, urgency, frequency, hematuria, flank pain, vaginal bleeding, vaginal discharge, difficulty urinating, vaginal pain and pelvic pain.  Musculoskeletal: Negative for joint swelling, arthralgias and gait problem.  Skin: Negative for rash.  Neurological: Negative for dizziness, syncope, speech difficulty, weakness, numbness and headaches.  Hematological: Negative for adenopathy.  Psychiatric/Behavioral: Negative for behavioral problems, dysphoric mood and agitation. The patient is not nervous/anxious.        Objective:   Physical Exam  Constitutional: She is oriented to person, place, and time. She appears well-developed and well-nourished.  HENT:  Head: Normocephalic.  Right Ear: External ear normal.  Left Ear: External ear normal.  Mouth/Throat: Oropharynx is clear and moist.  Eyes: Conjunctivae and EOM are normal. Pupils are equal, round, and reactive to light.  Neck: Normal range of motion. Neck supple. No thyromegaly present.  Cardiovascular: Normal rate, regular rhythm, normal heart sounds and intact distal pulses.   Pulmonary/Chest: Effort normal and breath sounds normal.  Abdominal: Soft. Bowel sounds are normal. She exhibits no mass. There is no tenderness.  Musculoskeletal: Normal range of motion.  Lymphadenopathy:    She has no cervical adenopathy.  Neurological: She is alert and oriented to person, place, and time.  Skin: Skin is warm and dry. No rash noted.  Psychiatric: She has a normal mood and affect. Her behavior is normal.          Assessment & Plan:   Diabetes mellitus.  Will check hemoglobin A1c Essential hypertension, stable.  No change in  therapy  Recheck 6 months

## 2015-01-29 NOTE — Patient Instructions (Signed)
Limit your sodium (Salt) intake  You need to lose weight.  Consider a lower calorie diet and regular exercise.  Please check your blood pressure on a regular basis.  If it is consistently greater than 150/90, please make an office appointment.  Return in 6 months for follow-up   

## 2015-01-29 NOTE — Progress Notes (Signed)
Pre visit review using our clinic review tool, if applicable. No additional management support is needed unless otherwise documented below in the visit note. 

## 2015-01-30 ENCOUNTER — Encounter: Payer: Self-pay | Admitting: Radiation Oncology

## 2015-01-30 ENCOUNTER — Ambulatory Visit
Admission: RE | Admit: 2015-01-30 | Discharge: 2015-01-30 | Disposition: A | Discharge status: Home / Self Care | Payer: Medicare Other | Source: Ambulatory Visit | Attending: Radiation Oncology | Admitting: Radiation Oncology

## 2015-01-30 DIAGNOSIS — C50511 Malignant neoplasm of lower-outer quadrant of right female breast: Secondary | ICD-10-CM

## 2015-01-30 NOTE — Progress Notes (Signed)
CC: Dr. Truitt Merle, Dr. Erroll Luna   Chart note:  Darlene Mclaughlin had her right breast mammogram and there were no residual calcifications.  I spoke with the patient, and she will consider antiestrogen therapy which she meets with Dr. Burr Medico in early January.  No plans for radiation therapy.

## 2015-01-31 ENCOUNTER — Other Ambulatory Visit: Payer: Self-pay | Admitting: Internal Medicine

## 2015-02-07 ENCOUNTER — Telehealth: Payer: Self-pay | Admitting: Internal Medicine

## 2015-02-07 NOTE — Telephone Encounter (Signed)
Pt would like blood work results °

## 2015-02-10 NOTE — Telephone Encounter (Signed)
Spoke to pt, told her Hemoglobin A1c was 6.6, not bad one point above normal. Pt verbalized understanding.

## 2015-02-15 ENCOUNTER — Other Ambulatory Visit: Payer: Self-pay | Admitting: Internal Medicine

## 2015-02-26 ENCOUNTER — Ambulatory Visit (HOSPITAL_BASED_OUTPATIENT_CLINIC_OR_DEPARTMENT_OTHER): Payer: Medicare Other | Admitting: Hematology

## 2015-02-26 ENCOUNTER — Encounter: Payer: Self-pay | Admitting: Hematology

## 2015-02-26 VITALS — BP 121/60 | HR 85 | Temp 98.6°F | Resp 19 | Wt 252.7 lb

## 2015-02-26 DIAGNOSIS — C50511 Malignant neoplasm of lower-outer quadrant of right female breast: Secondary | ICD-10-CM

## 2015-02-26 DIAGNOSIS — D0511 Intraductal carcinoma in situ of right breast: Secondary | ICD-10-CM

## 2015-02-26 DIAGNOSIS — I10 Essential (primary) hypertension: Secondary | ICD-10-CM

## 2015-02-26 DIAGNOSIS — Z170 Estrogen receptor positive status [ER+]: Secondary | ICD-10-CM | POA: Diagnosis not present

## 2015-02-26 DIAGNOSIS — E119 Type 2 diabetes mellitus without complications: Secondary | ICD-10-CM

## 2015-02-26 MED ORDER — ANASTROZOLE 1 MG PO TABS: 1.0000 mg | ORAL_TABLET | Freq: Every day | ORAL | Status: DC

## 2015-02-26 MED ORDER — EXEMESTANE 25 MG PO TABS: 25.0000 mg | ORAL_TABLET | Freq: Every day | ORAL | Status: DC

## 2015-02-26 NOTE — Progress Notes (Signed)
[place] FOLLOW UP NOTE  Patient Care Team: Marletta Lor, MD as PCP - General Erroll Luna, MD as Consulting Physician (General Surgery)  CHIEF COMPLAINTS:  Follow up right breast DCIS  . Oncology History   Breast cancer of lower-outer quadrant of right female breast [facility])   Staging form: Breast, AJCC 7th Edition     Clinical stage from 11/15/2014: Stage 0 (Tis (DCIS), N0, M0) - Signed by Truitt Merle, MD on 11/26/2014       Breast cancer of lower-outer quadrant of right female breast ([place])   11/06/2014 Mammogram Screening mammogram showed calcifications in the lower outer quadrant of the right breast, which was confirmed by diagnostic mammogram   11/15/2014 Initial Biopsy Breast core needle biopsy showed ductal carcinoma in situ with calcifications.   11/15/2014 Initial Diagnosis Breast cancer of lower-outer quadrant of right female breast ([place])   11/15/2014 Receptors her2 ER 100% positive, PR 90% positive   12/17/2014 Surgery Right breast lumpectomy.   12/17/2014 Pathology Results Right breast lumpectomy showed usual ductal Hyperplasia and fibroadenoma. No residual ductal carcinoma in situ.     HISTORY OF PRESENTING ILLNESS:  Darlene Mclaughlin 65 y.o. female is here because of recent diagnosis of right breast DCIS.   The cancer was detected by screening mammogram. She does mammogram every year. The cancer was not palpable prior to diagnosis. She denies any other new symptoms.  She has chronic back pain, and had epidural injection twice which helped a lot. She also has chronic arthritis, especially knees and feet, she takes tramadol for both back and knee pain, she takes 0-1tab/day.  She denies any other symptoms. She has good appetite and eats well. She has good energy level. She is widowed, lives alone and very independent. She has 2 sons and 3 grandchildren, and she sees him often. In terms of breast cancer risk profile:  She menarched at early age of 58 and went  to menopause at age 40 (hysrectomy)  She had 2 pregnancy and 2 children, her first child was born at age 68 She did not breast-fed her child.  She received birth control pills for approximately 5 years  She was never exposed to fertility medications or hormone replacement therapy.  She has family history of Breast cancer (mother)   CURRENT THERAPY: pending  Aromasin  25 mg once daily  INTERIM HISTORY: Ms  Darlene Mclaughlin returns for follow-up. She is clinically doing very well.  She still has chronic stable back pain, for which she takes tramadol 2-3 times a week. No other significant pain, or other discomfort. She had one week location with her family members at the [facility] last week, and really enjoyed. She has met patient oncologist Dr. Valere Dross,  [facility] do not recommend adjuvant radiation. She is here to discuss adjuvant endocrine therapy.   MEDICAL HISTORY:  Past Medical History  Diagnosis Date  . ALLERGIC RHINITIS 05/15/2008  . ASTHMA 05/15/2008  . Headache(784.0) 05/15/2008  . HYPERTENSION 05/15/2008  . IMPAIRED GLUCOSE TOLERANCE 05/15/2008  . Obesity   . Diabetes mellitus without complication ([place])   . GERD (gastroesophageal reflux disease)   . OSTEOARTHRITIS 05/15/2008    back  . Cancer [facility])     DCIS right breast    SURGICAL HISTORY: Past Surgical History  Procedure Laterality Date  . Cesarean section    . Abdominal hysterectomy    . Knee surgery      arthroscopic left  . Breast lumpectomy with radioactive seed localization  Right 12/17/2014    Procedure: RIGHT BREAST RADIOACTIVE SEED GUIDED PARTIAL MASTECTOMY;  Surgeon: Erroll Luna, MD;  Location: [place];  Service: General;  Laterality: Right;    SOCIAL HISTORY: Social History   Social History  . Marital Status: Widowed    Spouse Name: N/A  . Number of Children: N/A  . Years of Education: N/A   Occupational History  . Retired    Social History Main Topics  . Smoking status: Never Smoker   .  Smokeless tobacco: Never Used  . Alcohol Use: Yes     Comment: RARELY  . Drug Use: No  . Sexual Activity: Not on file   Other Topics Concern  . Not on file   Social History Narrative    FAMILY HISTORY: Family History  Problem Relation Age of Onset  . Diabetes Mother   . Hypertension Mother   . Cancer Mother 90    Breast cancer twice   . Hyperlipidemia Father   . Cancer Brother     pancreatic cancer   . Diabetes Brother     ALLERGIES:  is allergic to macrobid; naproxen; and zanaflex.  MEDICATIONS:  Current Outpatient Prescriptions  Medication Sig Dispense Refill  . acetaminophen (TYLENOL) 650 MG CR tablet Take 650 mg by mouth every 8 (eight) hours as needed.      Marland Kitchen aspirin 81 MG tablet Take 81 mg by mouth daily.      . chlorthalidone (HYGROTON) 25 MG tablet TAKE 1 TABLET (25 MG TOTAL) BY MOUTH DAILY. 90 tablet 1  . EPIPEN 2-PAK 0.3 MG/0.3ML SOAJ injection 0.3 mg.   1  . fluticasone (FLONASE) 50 MCG/ACT nasal spray Place 1 spray into both nostrils daily. 16 g 5  . glucose blood (FREESTYLE LITE) test strip 1 each by Other route 2 (two) times daily. Use as instructed 100 each 12  . KLOR-CON M20 20 MEQ tablet TAKE 1 TABLET DAILY. 90 tablet 1  . metFORMIN (GLUCOPHAGE-XR) 500 MG 24 hr tablet TAKE 2 TABLETS (1,000 MG TOTAL) BY MOUTH DAILY WITH SUPPER. 180 tablet 4  . Multiple Vitamin (MULTIVITAMIN) tablet Take 1 tablet by mouth daily.      . NON FORMULARY WEEKLY ALLERGY INJECTIONS     . pantoprazole (PROTONIX) 40 MG tablet Take 1 tablet (40 mg total) by mouth daily. 90 tablet 3  . PROAIR HFA 108 (90 BASE) MCG/ACT inhaler Inhale 1 puff into the lungs every 6 (six) hours as needed.   0  . traMADol (ULTRAM) 50 MG tablet TAKE 1 TABLET BY MOUTH EVERY 6 HOURS AS NEEDED FOR PAIN 50 tablet 2  . exemestane (AROMASIN) 25 MG tablet Take 1 tablet (25 mg total) by mouth daily after breakfast. 30 tablet 2   No current facility-administered medications for this visit.    REVIEW OF SYSTEMS:    Constitutional: Denies fevers, chills or abnormal night sweats Eyes: Denies blurriness of vision, double vision or watery eyes Ears, nose, mouth, throat, and face: Denies mucositis or sore throat Respiratory: Denies cough, dyspnea or wheezes Cardiovascular: Denies palpitation, chest discomfort or lower extremity swelling Gastrointestinal:  Denies nausea, heartburn or change in bowel habits Skin: Denies abnormal skin rashes Lymphatics: Denies new lymphadenopathy or easy bruising Neurological:Denies numbness, tingling or new weaknesses Behavioral/Psych: Mood is stable, no new changes  All other systems were reviewed with the patient and are negative.  PHYSICAL EXAMINATION: ECOG PERFORMANCE STATUS: 1 - Symptomatic but completely ambulatory  Filed Vitals:   02/26/15 0843  BP:  121/60  Pulse: 85  Temp: 98.6 F (37 C)  Resp: 19   Filed Weights   02/26/15 0843  Weight: 252 lb 11.2 oz (114.624 kg)    GENERAL:alert, no distress and comfortable SKIN: skin color, texture, turgor are normal, no rashes or significant lesions EYES: normal, conjunctiva are pink and non-injected, sclera clear OROPHARYNX:no exudate, no erythema and lips, buccal mucosa, and tongue normal  NECK: supple, thyroid normal size, non-tender, without nodularity LYMPH:  no palpable lymphadenopathy in the cervical, axillary or inguinal LUNGS: clear to auscultation and percussion with normal breathing effort HEART: regular rate & rhythm and no murmurs and no lower extremity edema ABDOMEN:abdomen soft, non-tender and normal bowel sounds Musculoskeletal:no cyanosis of digits and no clubbing  PSYCH: alert & oriented x 3 with fluent speech NEURO: no focal motor/sensory deficits Breasts: Breast inspection showed them to be symmetrical with no nipple discharge. Right breast incision site is clean and healing well.  Palpation of the left breast and axilla revealed no obvious mass that I could appreciate.   LABORATORY DATA:   I have reviewed the data as listed Lab Results  Component Value Date   WBC 8.0 12/11/2014   HGB 14.5 12/11/2014   HCT 42.0 12/11/2014   MCV 97.2 12/11/2014   PLT 192 12/11/2014   Lab Results  Component Value Date   NA 139 12/11/2014   K 3.4* 12/11/2014   CL 99* 12/11/2014   CO2 28 12/11/2014    PATHOLOGY REPORT Diagnosis 11/15/2014 Breast, right, needle core biopsy, lower outer - DUCTAL CARCINOMA IN SITU WITH CALCIFICATIONS. - FIBROADENOMA. - SEE COMMENT. Results: IMMUNOHISTOCHEMICAL AND MORPHOMETRIC ANALYSIS PERFORMED MANUALLY Estrogen Receptor: 100%, POSITIVE, STRONG STAINING INTENSITY Progesterone Receptor: 90%, POSITIVE, STRONG STAINING INTENSITY  Diagnosis 12/17/2014 Breast, lumpectomy, Right USUAL DUCTAL HYPERPLASIA AND FIBROADENOMA BIOPSY SITE CHANGES NO RESIDUAL DUCTAL CARCINOMA IN SITU IS IDENTIFIED  RADIOGRAPHIC STUDIES: I have personally reviewed the radiological images as listed and agreed with the findings in the report.  Mm Digital Diagnostic Unilat R 11/12/2014      IMPRESSION: Grouped punctate and amorphous calcifications confirmed within the lower outer quadrant of the right breast, 6-7 o'clock axis region, for which stereotactic biopsy is recommended.  RECOMMENDATION: Stereotactic biopsy, possibly with tomosynthesis guidance, for the right breast calcifications.  Stereotactic biopsy is scheduled for September 23rd.  I have discussed the findings and recommendations with the patient. Results were also provided in writing at the conclusion of the visit. If applicable, a reminder letter will be sent to the patient regarding the next appointment.  BI-RADS CATEGORY  4: Suspicious.   Electronically Signed   By: Franki Cabot M.D.   On: 11/12/2014 12:09   Mm Digital Screening Bilateral 11/06/2014   ACR Breast Density Category b: There are scattered areas of fibroglandular density.  FINDINGS: In the right breast, calcifications warrant further evaluation with magnified  views. In the left breast, no findings suspicious for malignancy. Images were processed with CAD.  IMPRESSION: Further evaluation is suggested for calcifications in the right breast.  RECOMMENDATION: Diagnostic mammogram of the right breast. (Code:[id number])  The patient will be contacted regarding the findings, and additional imaging will be scheduled.  BI-RADS CATEGORY  0: Incomplete. Need additional imaging evaluation and/or prior mammograms for comparison.   Electronically Signed   By: Nolon Nations M.D.   On: 11/06/2014 10:27     ASSESSMENT: 34 -year-old African-American female, with past medical history of hypertension, IgG, arthritis, chronic back pain, obesity, and the well-controlled asthma, who  was found to have right breast DCIS by screening mammogram.  PLAN:  #1 right breast DCIS, ER/PR strongly positive -I reviewed her surgical pathology results, which showed no residual malignant cells.The patient had early stage disease. She  Is cured.  -Any form of adjuvant treatment is for preventive  - I discussed her risk of future breast cancer by using the [facility] breast cancer nomogram. Given her age and positive family history , her risk of recurrence is 10% in 5 years and 16% in 10 years. Antiestrogen therapy would decrease the risk by 50%. -adjuvant radiation is not recommended -Giving a strong ER/PR positivity of her tumor, I recommend chemoprevention with anastrozole. Potential size effects, which includes but not limited to, hot flash, scan of vaginal dryness, osteoporosis, muscular and joint discomfort, slight increased risk of cardiovascular disease and cataracts, we'll discuss with her in great details. She agrees to proceed. I sent a prescription of anastrozole to her pharmacy today. -She does have chronic back pain, if her arthritis gets worse with anastrozole, I'll switch her to tamoxifen. -We discussed breast cancer surveillance, she is agreeable to continue annual  screening mammogram. -I encouraged her to eat healthy and accessed regularly. She is not a very active due to the back pain and arthritis, I also encouraged her to consider weight loss, she is a motivated.  #2 Bone health Her last DEXA scan was 2011, will repeat one in the next month   #3 arthritis, hypertension, diabetes -She'll continue follow-up with her primary care physician  Plan -Start anastrozole 1 mg once daily in the next few days -I'll see her back in a week's period  All questions were answered. The patient knows to call the clinic with any problems, questions or concerns. I spent 20 minutes counseling the patient face to face. The total time spent in the appointment was 30 minutes and more than 50% was on counseling.     Truitt Merle, MD 02/26/2015 9:16 AM

## 2015-02-26 NOTE — Addendum Note (Signed)
Addended by: Truitt Merle on: 02/26/2015 05:01 PM   Modules accepted: Orders

## 2015-02-27 ENCOUNTER — Telehealth: Payer: Self-pay | Admitting: Hematology

## 2015-02-27 ENCOUNTER — Other Ambulatory Visit: Payer: Self-pay | Admitting: *Deleted

## 2015-02-27 DIAGNOSIS — C50511 Malignant neoplasm of lower-outer quadrant of right female breast: Secondary | ICD-10-CM

## 2015-02-27 NOTE — Telephone Encounter (Signed)
S/w pt, gave appt for dexa scan at [facility] on 2/3 @ 10am arrive at 9.40am. Advised will call back with surviorship appt.

## 2015-03-03 ENCOUNTER — Telehealth: Payer: Self-pay | Admitting: Hematology

## 2015-03-03 NOTE — Telephone Encounter (Signed)
Added SCP visit for 3/9. Spoke with patient she is aware. Also confirmed 3/2 lab/YF and 2/3 bone density.

## 2015-03-28 ENCOUNTER — Ambulatory Visit
Admission: RE | Admit: 2015-03-28 | Discharge: 2015-03-28 | Disposition: A | Discharge status: Home / Self Care | Payer: Medicare Other | Source: Ambulatory Visit | Attending: Hematology | Admitting: Hematology

## 2015-03-28 DIAGNOSIS — C50511 Malignant neoplasm of lower-outer quadrant of right female breast: Secondary | ICD-10-CM

## 2015-04-23 ENCOUNTER — Telehealth: Payer: Self-pay | Admitting: Hematology

## 2015-04-23 NOTE — Telephone Encounter (Signed)
Due to YF out moved 3/2 lab/fu to 3/16. Spoke with patient she is aware.

## 2015-04-24 ENCOUNTER — Ambulatory Visit: Payer: Medicare Other | Admitting: Hematology

## 2015-04-24 ENCOUNTER — Other Ambulatory Visit: Payer: Medicare Other

## 2015-05-01 ENCOUNTER — Ambulatory Visit (HOSPITAL_BASED_OUTPATIENT_CLINIC_OR_DEPARTMENT_OTHER): Payer: Medicare Other | Admitting: Nurse Practitioner

## 2015-05-01 ENCOUNTER — Encounter: Payer: Self-pay | Admitting: Nurse Practitioner

## 2015-05-01 VITALS — BP 149/71 | HR 93 | Temp 98.1°F | Resp 18 | Ht 65.0 in | Wt 261.0 lb

## 2015-05-01 DIAGNOSIS — Z79811 Long term (current) use of aromatase inhibitors: Secondary | ICD-10-CM

## 2015-05-01 DIAGNOSIS — C50511 Malignant neoplasm of lower-outer quadrant of right female breast: Secondary | ICD-10-CM

## 2015-05-01 DIAGNOSIS — Z170 Estrogen receptor positive status [ER+]: Secondary | ICD-10-CM | POA: Diagnosis not present

## 2015-05-01 DIAGNOSIS — D0511 Intraductal carcinoma in situ of right breast: Secondary | ICD-10-CM

## 2015-05-01 NOTE — Progress Notes (Signed)
CLINIC:  Cancer Survivorship   REASON FOR VISIT:  Routine follow-up post-treatment for a recent history of breast cancer.  BRIEF ONCOLOGIC HISTORY:  Oncology History   Breast cancer of lower-outer quadrant of right female breast [facility])   Staging form: Breast, AJCC 7th Edition     Clinical stage from 11/15/2014: Stage 0 (Tis (DCIS), N0, M0) - Signed by Truitt Merle, MD on 11/26/2014       Breast cancer of lower-outer quadrant of right female breast ([place])   11/06/2014 Mammogram Screening mammogram showed calcifications in the lower outer quadrant of the right breast, which was confirmed by diagnostic mammogram   11/15/2014 Initial Biopsy Breast core needle biopsy showed ductal carcinoma in situ with calcifications.   11/15/2014 Receptors her2 ER+ (100%); PR+ (90%)   11/15/2014 Clinical Stage Stage 0: Tis N0   12/17/2014 Surgery Right breast lumpectomy.   12/17/2014 Pathology Results Right breast lumpectomy showed usual ductal Hyperplasia and fibroadenoma. No residual ductal carcinoma in situ.     Radiation Therapy Not recommended due to lack of invasive disease at time of definitive surgery   03/28/2015 -  Anti-estrogen oral therapy Anastrozole 1 mg daily begun 03/28/2015    INTERVAL HISTORY:  Ms. Larose presents to the [facility] today for our initial meeting to review her survivorship care plan detailing her treatment course for breast cancer, as well as monitoring long-term side effects of that treatment, education regarding health maintenance, screening, and overall wellness and health promotion.     Overall, Ms. Schwarz reports feeling well since her surgery back in October 2016.  She ended up not requiring radiation due to the lack of residual malignancy at the time of surgery.  She has begun her anastrozole and is tolerating it well without significant hot flashes, vaginal dryness or joint pain. She sees Dr. Burr Medico next Thursday in follow up.  She continues with hip and back pain,  which predated her cancer diagnosis.  This is no worse. She has mild fatigue, which is unchanged. She sees her orthopedic doctor for intermittent injections, which do provide relief.  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  She denies any change in her breast.  Ms. Petterson has had no headache, cough, or shortness of breath.  She has a good appetite and denies any weight loss.    REVIEW OF SYSTEMS:  General: Mild fatigue.  Denies fever, chills, unintentional weight loss, or night sweats. HEENT: Denies visual changes, hearing loss, mouth sores or difficulty swallowing. Cardiac: Denies palpitations, chest pain, and lower extremity edema.  Respiratory: Denies wheeze or dyspnea on exertion.  Breast: Denies any new nodularity, masses, tenderness, nipple changes, or nipple discharge.  GI: Denies abdominal pain, constipation, diarrhea, nausea, or vomiting.  GU: Denies dysuria, hematuria, vaginal bleeding, vaginal discharge, or vaginal dryness.  Musculoskeletal: Hip and back pain, as above.  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   Skin: Denies rash, pruritis, or open wounds.  Psych: Difficulty falling / staying asleep, as above. Denies depression, anxiety, or memory loss.   A 14-point review of systems was completed and was negative, except as noted above.   ONCOLOGY TREATMENT TEAM:  1. Surgeon:  Dr. Brantley Stage at [facility]  2. Medical Oncologist: Dr. Burr Medico 3. Radiation Oncologist: Dr. Valere Dross    PAST MEDICAL/SURGICAL HISTORY:  Past Medical History  Diagnosis Date  . ALLERGIC RHINITIS  05/15/2008  . ASTHMA 05/15/2008  . Headache(784.0) 05/15/2008  . HYPERTENSION 05/15/2008  . IMPAIRED GLUCOSE TOLERANCE 05/15/2008  . Obesity   . Diabetes mellitus without complication ([place])   . GERD (gastroesophageal reflux disease)   . OSTEOARTHRITIS 05/15/2008     back  . Cancer [facility])     DCIS right breast   Past Surgical History  Procedure Laterality Date  . Cesarean section    . Abdominal hysterectomy    . Knee surgery      arthroscopic left  . Breast lumpectomy with radioactive seed localization Right 12/17/2014    Procedure: RIGHT BREAST RADIOACTIVE SEED GUIDED PARTIAL MASTECTOMY;  Surgeon: Erroll Luna, MD;  Location: [facility];  Service: General;  Laterality: Right;     ALLERGIES:  Allergies  Allergen Reactions  . Macrobid [Nitrofurantoin Monohyd Macro]      Liver problems  . Naproxen   . Zanaflex [Tizanidine Hcl] Other (See Comments)     Liver problems     CURRENT MEDICATIONS:  Current Outpatient Prescriptions on File Prior to Visit  Medication Sig Dispense Refill  . acetaminophen (TYLENOL) 650 MG CR tablet Take 650 mg by mouth every 8 (eight) hours as needed.      Marland Kitchen anastrozole (ARIMIDEX) 1 MG tablet Take 1 tablet (1 mg total) by mouth daily. 30 tablet 2  . aspirin 81 MG tablet Take 81 mg by mouth daily.      . chlorthalidone (HYGROTON) 25 MG tablet TAKE 1 TABLET (25 MG TOTAL) BY MOUTH DAILY. 90 tablet 1  . fluticasone (FLONASE) 50 MCG/ACT nasal spray Place 1 spray into both nostrils daily. 16 g 5  . glucose blood (FREESTYLE LITE) test strip 1 each by Other route 2 (two) times daily. Use as instructed 100 each 12  . KLOR-CON M20 20 MEQ tablet TAKE 1 TABLET DAILY. 90 tablet 1  . metFORMIN (GLUCOPHAGE-XR) 500 MG 24 hr tablet TAKE 2 TABLETS (1,000 MG TOTAL) BY MOUTH DAILY WITH SUPPER. 180 tablet 4  . Multiple Vitamin (MULTIVITAMIN) tablet Take 1 tablet by mouth daily.      . NON FORMULARY WEEKLY ALLERGY INJECTIONS     . pantoprazole (PROTONIX) 40 MG tablet Take 1 tablet (40 mg total) by mouth daily. 90 tablet 3  . PROAIR HFA 108 (90 BASE) MCG/ACT inhaler Inhale 1 puff into the lungs every 6 (six) hours as needed.   0  . traMADol (ULTRAM) 50 MG tablet TAKE 1 TABLET BY MOUTH EVERY 6 HOURS AS NEEDED FOR PAIN 50  tablet 2  . EPIPEN 2-PAK 0.3 MG/0.3ML SOAJ injection 0.3 mg. Reported on 05/01/2015  1   No current facility-administered medications on file prior to visit.     ONCOLOGIC FAMILY HISTORY:  Family History  Problem Relation Age of Onset  . Diabetes Mother   . Hypertension Mother   . Cancer Mother 58    Breast cancer twice   . Hyperlipidemia Father   . Cancer Brother     pancreatic cancer   . Diabetes Brother      GENETIC COUNSELING/TESTING: No    SOCIAL HISTORY:  DEKOTA KIRLIN is widowed and lives alone in [place], [place].  She has 2 children. 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  She denies any current or history of tobacco or illicit drug use.  She rarely uses alcohol (less than 1 time / every 2 months).   PHYSICAL EXAMINATION:  Vital Signs: Filed Vitals:   05/01/15 1314  BP: 149/71  Pulse: 93  Temp: 98.1 F (36.7 C)  Resp: 18   ECOG Performance Status: 0  General: Well-nourished, well-appearing female in no acute distress.  She is unaccompanied in clinic today.   HEENT: Head is atraumatic and normocephalic.  Pupils equal and reactive to light and accomodation. Conjunctivae clear without exudate.  Sclerae anicteric. Oral mucosa is pink, moist, and intact without lesions.  Oropharynx is pink without lesions or erythema.  Lymph: No cervical, supraclavicular, infraclavicular, or axillary lymphadenopathy noted on palpation.  Cardiovascular: Regular rate and rhythm without murmurs, rubs, or gallops. Respiratory: Clear to auscultation bilaterally. Chest expansion symmetric without accessory muscle use on inspiration or expiration.  GI: Abdomen soft and round. No tenderness to palpation. Bowel sounds normoactive in 4 quadrants. GU: Deferred.    Neuro: No focal deficits. Steady gait.  Psych: Mood and affect normal and appropriate for situation.  Extremities: No edema, cyanosis, or clubbing.  Skin: Warm and dry. No open lesions noted.   LABORATORY  DATA:  None for this visit.  DIAGNOSTIC IMAGING:  None for this visit.     ASSESSMENT AND PLAN:   1. 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 Santillano is doing well without clinical symptoms worrisome for disease recurrence. She will follow-up with her medical oncologist,  Dr. Burr Medico, next week with history and physical examination per surveillance protocol.  She will continue her anti-estrogen therapy with anastrozole at this time. She was instructed to make [place] aware if she begins to experience any new or increased side effects of the medication and I could see her back in clinic to help manage those side effects, as needed. A comprehensive survivorship care plan and treatment summary was reviewed with the patient today detailing her breast cancer diagnosis, treatment course, potential late/long-term effects of treatment, appropriate follow-up care with recommendations for the future, and patient education resources.  A copy of this summary, along with a letter will be sent to the patient's primary care provider via in basket message after today's visit.  Ms. Allocca is welcome to return to the Survivorship Clinic in the future, as needed; no follow-up will be scheduled at this time.    2. Bone health:  Given Ms. Mowbray's age/history of breast cancer and her current treatment regimen including endocrine therapy with anastrozole, she is at risk for bone demineralization.  Per our records, her last DEXA scan was performed in February 2017 revealing osteopenia.  We will continue to monitor this closely while she is on endocrine therapy.  In the meantime, she was encouraged to increase her consumption of foods rich in calcium and vitamin D as well as to increase her weight-bearing  activities.  She was given education on specific activities to promote bone health.  3. Cancer screening:  Due to Ms. Elena's history and her age, she should receive screening for skin cancers and colon cancer. She is S/P hysterectomy.  The information and recommendations are listed on the patient's comprehensive care plan/treatment summary and were reviewed in detail with the patient.    4. Health maintenance and wellness promotion: Ms. Dettmer was encouraged to consume 5-7 servings of fruits and vegetables per day. We reviewed the "Nutrition Rainbow" handout, as well as discussed recommendations to maximize nutrition and minimize recurrence, such as increased intake  of fruits, vegetables, lean proteins, and minimizing the intake of red meats and processed foods.  She was also encouraged to engage in moderate to vigorous exercise for 30 minutes per day most days of the week. She has signed back up for the water aerobics program through the [facility] and is greatly looking forward to being able to participate in this again.  We also discussed the [employer] fitness program, which is designed for cancer survivors to help them become more physically fit after cancer treatments.  She was instructed to continue to limit her alcohol consumption and continue to abstain from tobacco use.  A copy of the "Take Control of Your Health" brochure was given to her reinforcing these recommendations.   5. Support services/counseling: It is not uncommon for this period of the patient's cancer care trajectory to be one of many emotions and stressors.  We discussed an opportunity for her to participate in the next session of [facility] ("Finding Your New Normal") support group series designed for patients after they have completed treatment.  Ms. Poliquin was encouraged to take advantage of our many other support services programs, support groups, and/or counseling in coping with her new life as a cancer survivor after completing  anti-cancer treatment.  She was offered support today through active listening and expressive supportive counseling. She was given information regarding our available services and encouraged to contact me with any questions or for help enrolling in any of our support group/programs.    A total of 40 minutes of face-to-face time was spent with this patient with greater than 50% of that time in counseling and care-coordination.   Sylvan Cheese, NP  Survivorship Program [facility] [phone]   Note: PRIMARY CARE PROVIDER Nyoka Cowden, [place] [phone]

## 2015-05-02 ENCOUNTER — Other Ambulatory Visit: Payer: Self-pay | Admitting: Internal Medicine

## 2015-05-07 ENCOUNTER — Other Ambulatory Visit: Payer: Self-pay | Admitting: *Deleted

## 2015-05-07 DIAGNOSIS — C50511 Malignant neoplasm of lower-outer quadrant of right female breast: Secondary | ICD-10-CM

## 2015-05-08 ENCOUNTER — Other Ambulatory Visit (HOSPITAL_BASED_OUTPATIENT_CLINIC_OR_DEPARTMENT_OTHER): Payer: Medicare Other

## 2015-05-08 ENCOUNTER — Encounter: Payer: Self-pay | Admitting: Hematology

## 2015-05-08 ENCOUNTER — Ambulatory Visit (HOSPITAL_BASED_OUTPATIENT_CLINIC_OR_DEPARTMENT_OTHER): Payer: Medicare Other | Admitting: Hematology

## 2015-05-08 ENCOUNTER — Telehealth: Payer: Self-pay | Admitting: Hematology

## 2015-05-08 VITALS — BP 142/55 | HR 80 | Temp 98.4°F | Resp 18 | Ht 65.0 in | Wt 259.7 lb

## 2015-05-08 DIAGNOSIS — I10 Essential (primary) hypertension: Secondary | ICD-10-CM

## 2015-05-08 DIAGNOSIS — Z170 Estrogen receptor positive status [ER+]: Secondary | ICD-10-CM

## 2015-05-08 DIAGNOSIS — D0511 Intraductal carcinoma in situ of right breast: Secondary | ICD-10-CM | POA: Diagnosis not present

## 2015-05-08 DIAGNOSIS — M8580 Other specified disorders of bone density and structure, unspecified site: Secondary | ICD-10-CM

## 2015-05-08 DIAGNOSIS — Z79811 Long term (current) use of aromatase inhibitors: Secondary | ICD-10-CM | POA: Diagnosis not present

## 2015-05-08 DIAGNOSIS — C50511 Malignant neoplasm of lower-outer quadrant of right female breast: Secondary | ICD-10-CM

## 2015-05-08 DIAGNOSIS — E119 Type 2 diabetes mellitus without complications: Secondary | ICD-10-CM | POA: Diagnosis not present

## 2015-05-08 LAB — CBC WITH DIFFERENTIAL/PLATELET
BASO%: 0.6 % (ref 0.0–2.0)
Basophils Absolute: 0 10*3/uL (ref 0.0–0.1)
EOS%: 2.2 % (ref 0.0–7.0)
Eosinophils Absolute: 0.1 10*3/uL (ref 0.0–0.5)
HCT: 42 % (ref 34.8–46.6)
HGB: 13.7 g/dL (ref 11.6–15.9)
LYMPH%: 29.7 % (ref 14.0–49.7)
MCH: 30.9 pg (ref 25.1–34.0)
MCHC: 32.6 g/dL (ref 31.5–36.0)
MCV: 94.9 fL (ref 79.5–101.0)
MONO#: 0.5 10*3/uL (ref 0.1–0.9)
MONO%: 7.3 % (ref 0.0–14.0)
NEUT#: 3.8 10*3/uL (ref 1.5–6.5)
NEUT%: 60.2 % (ref 38.4–76.8)
Platelets: 181 10*3/uL (ref 145–400)
RBC: 4.43 10*6/uL (ref 3.70–5.45)
RDW: 13.5 % (ref 11.2–14.5)
WBC: 6.4 10*3/uL (ref 3.9–10.3)
lymph#: 1.9 10*3/uL (ref 0.9–3.3)

## 2015-05-08 LAB — COMPREHENSIVE METABOLIC PANEL
ALT: 21 U/L (ref 0–55)
AST: 20 U/L (ref 5–34)
Albumin: 3.7 g/dL (ref 3.5–5.0)
Alkaline Phosphatase: 71 U/L (ref 40–150)
Anion Gap: 9 mEq/L (ref 3–11)
BUN: 16 mg/dL (ref 7.0–26.0)
CO2: 29 mEq/L (ref 22–29)
Calcium: 9.7 mg/dL (ref 8.4–10.4)
Chloride: 105 mEq/L (ref 98–109)
Creatinine: 0.8 mg/dL (ref 0.6–1.1)
EGFR: >90 mL/min/{1.73_m2} (ref >90)
Glucose: 126 mg/dl (ref 70–140)
Potassium: 3.5 mEq/L (ref 3.5–5.1)
Sodium: 144 mEq/L (ref 136–145)
Total Bilirubin: 0.52 mg/dL (ref 0.20–1.20)
Total Protein: 8.4 g/dL — ABNORMAL HIGH (ref 6.4–8.3)

## 2015-05-08 MED ORDER — ANASTROZOLE 1 MG PO TABS: 1.0000 mg | ORAL_TABLET | Freq: Every day | ORAL | Status: DC

## 2015-05-08 NOTE — Telephone Encounter (Signed)
per pof to sch pt appt-gave pt copy of avs °

## 2015-05-08 NOTE — Telephone Encounter (Signed)
Gave and printed appt sched and avs for pt for July  °

## 2015-05-08 NOTE — Progress Notes (Signed)
[place] FOLLOW UP NOTE  Patient Care Team: Marletta Lor, MD as PCP - General Erroll Luna, MD as Consulting Physician (General Surgery) Arloa Koh, MD as Consulting Physician (Radiation Oncology) Truitt Merle, MD as Consulting Physician (Hematology) Sylvan Cheese, NP as Nurse Practitioner (Hematology and Oncology)  CHIEF COMPLAINTS:  Follow up right breast DCIS  . Oncology History   Breast cancer of lower-outer quadrant of right female breast [facility])   Staging form: Breast, AJCC 7th Edition     Clinical stage from 11/15/2014: Stage 0 (Tis (DCIS), N0, M0) - Signed by Truitt Merle, MD on 11/26/2014       Breast cancer of lower-outer quadrant of right female breast ([place])   11/06/2014 Mammogram Screening mammogram showed calcifications in the lower outer quadrant of the right breast, which was confirmed by diagnostic mammogram   11/15/2014 Initial Biopsy Breast core needle biopsy showed ductal carcinoma in situ with calcifications.   11/15/2014 Receptors her2 ER+ (100%); PR+ (90%)   11/15/2014 Clinical Stage Stage 0: Tis N0   12/17/2014 Surgery Right breast lumpectomy.   12/17/2014 Pathology Results Right breast lumpectomy showed usual ductal Hyperplasia and fibroadenoma. No residual ductal carcinoma in situ.     Radiation Therapy Not recommended due to lack of residual malignancy at time of definitive surgery   03/28/2015 -  Anti-estrogen oral therapy Anastrozole 1 mg daily begun 03/28/2015   05/01/2015 Survivorship Survivorship visit completed and copy of care plan provided to patient    HISTORY OF PRESENTING ILLNESS:  Darlene Mclaughlin 65 y.o. female is here because of recent diagnosis of right breast DCIS.   The cancer was detected by screening mammogram. She does mammogram every year. The cancer was not palpable prior to diagnosis. She denies any other new symptoms.  She has chronic back pain, and had epidural injection twice which helped a lot. She also has  chronic arthritis, especially knees and feet, she takes tramadol for both back and knee pain, she takes 0-1tab/day.  She denies any other symptoms. She has good appetite and eats well. She has good energy level. She is widowed, lives alone and very independent. She has 2 sons and 3 grandchildren, and she sees him often. In terms of breast cancer risk profile:  She menarched at early age of 71 and went to menopause at age 27 (hysrectomy)  She had 2 pregnancy and 2 children, her first child was born at age 67 She did not breast-fed her child.  She received birth control pills for approximately 5 years  She was never exposed to fertility medications or hormone replacement therapy.  She has family history of Breast cancer (mother)   CURRENT THERAPY: anastrozole 11m daily started on 03/28/2015  INTERIM HISTORY: Ms  CHookreturns for follow-up. She is doing well overall. She has started anastrozole about 6 weeks ago, has been tolerating well overall. Her chronic back pain has not changed much, no other new joint or muscular discomfort. No significant hot flash. She was seen by our survivorship clinic a few weeks ago, and felt it was very informative visit.   MEDICAL HISTORY:  Past Medical History  Diagnosis Date  . ALLERGIC RHINITIS 05/15/2008  . ASTHMA 05/15/2008  . Headache(784.0) 05/15/2008  . HYPERTENSION 05/15/2008  . IMPAIRED GLUCOSE TOLERANCE 05/15/2008  . Obesity   . Diabetes mellitus without complication ([place]   . GERD (gastroesophageal reflux disease)   . OSTEOARTHRITIS 05/15/2008    back  . [place]  [place] right [place]    SURGICAL HISTORY: Past Surgical History  Procedure Laterality Date  . Cesarean section    . Abdominal hysterectomy    . Knee surgery      arthroscopic left  . Breast lumpectomy with radioactive seed localization Right 12/17/2014    Procedure: RIGHT BREAST RADIOACTIVE SEED GUIDED PARTIAL MASTECTOMY;  Surgeon: Erroll Luna, MD;  Location: [facility];  Service: General;  Laterality: Right;    SOCIAL HISTORY: Social History   Social History  . Marital Status: Widowed    Spouse Name: N/A  . Number of Children: N/A  . Years of Education: N/A   Occupational History  . Retired    Social History Main Topics  . Smoking status: Never Smoker   . Smokeless tobacco: Never Used  . Alcohol Use: Yes     Comment: RARELY  . Drug Use: No  . Sexual Activity: Not on file   Other Topics Concern  . Not on file   Social History Narrative    FAMILY HISTORY: Family History  Problem Relation Age of Onset  . Diabetes Mother   . Hypertension Mother   . Cancer Mother 78    Breast cancer twice   . Hyperlipidemia Father   . Cancer Brother     pancreatic cancer   . Diabetes Brother     ALLERGIES:  is allergic to macrobid; naproxen; and zanaflex.  MEDICATIONS:  Current Outpatient Prescriptions  Medication Sig Dispense Refill  . acetaminophen (TYLENOL) 650 MG CR tablet Take 650 mg by mouth every 8 (eight) hours as needed.      Marland Kitchen anastrozole (ARIMIDEX) 1 MG tablet Take 1 tablet (1 mg total) by mouth daily. 90 tablet 1  . aspirin 81 MG tablet Take 81 mg by mouth daily.      . chlorthalidone (HYGROTON) 25 MG tablet TAKE 1 TABLET (25 MG TOTAL) BY MOUTH DAILY. 90 tablet 1  . EPIPEN 2-PAK 0.3 MG/0.3ML SOAJ injection 0.3 mg. Reported on 05/01/2015  1  . fluticasone (FLONASE) 50 MCG/ACT nasal spray Place 1 spray into both nostrils daily. 16 g 5  . glucose blood (FREESTYLE LITE) test strip 1 each by Other route 2 (two) times daily. Use as instructed 100 each 12  . KLOR-CON M20 20 MEQ tablet TAKE 1 TABLET DAILY. 90 tablet 1  . metFORMIN (GLUCOPHAGE-XR) 500 MG 24 hr tablet TAKE 2 TABLETS (1,000 MG TOTAL) BY MOUTH DAILY WITH SUPPER. 180 tablet 4  . Multiple Vitamin (MULTIVITAMIN) tablet Take 1 tablet by mouth daily.      . NON FORMULARY WEEKLY ALLERGY INJECTIONS     . pantoprazole (PROTONIX) 40 MG tablet TAKE 1 TABLET (40 MG TOTAL) BY  MOUTH DAILY. 90 tablet 3  . PROAIR HFA 108 (90 BASE) MCG/ACT inhaler Inhale 1 puff into the lungs every 6 (six) hours as needed.   0  . traMADol (ULTRAM) 50 MG tablet TAKE 1 TABLET BY MOUTH EVERY 6 HOURS AS NEEDED FOR PAIN 50 tablet 2   No current facility-administered medications for this visit.    REVIEW OF SYSTEMS:   Constitutional: Denies fevers, chills or abnormal night sweats Eyes: Denies blurriness of vision, double vision or watery eyes Ears, nose, mouth, throat, and face: Denies mucositis or sore throat Respiratory: Denies cough, dyspnea or wheezes Cardiovascular: Denies palpitation, chest discomfort or lower extremity swelling Gastrointestinal:  Denies nausea, heartburn or change in bowel habits Skin: Denies abnormal skin rashes Lymphatics: Denies new lymphadenopathy or easy bruising  Neurological:Denies numbness, tingling or new weaknesses Behavioral/Psych: Mood is stable, no new changes  All other systems were reviewed with the patient and are negative.  PHYSICAL EXAMINATION: ECOG PERFORMANCE STATUS: 1 - Symptomatic but completely ambulatory  Filed Vitals:   05/08/15 0935  BP: 142/55  Pulse: 80  Temp: 98.4 F (36.9 C)  Resp: 18   Filed Weights   05/08/15 0935  Weight: 259 lb 11.2 oz (117.799 kg)    GENERAL:alert, no distress and comfortable SKIN: skin color, texture, turgor are normal, no rashes or significant lesions EYES: normal, conjunctiva are pink and non-injected, sclera clear OROPHARYNX:no exudate, no erythema and lips, buccal mucosa, and tongue normal  NECK: supple, thyroid normal size, non-tender, without nodularity LYMPH:  no palpable lymphadenopathy in the cervical, axillary or inguinal LUNGS: clear to auscultation and percussion with normal breathing effort HEART: regular rate & rhythm and no murmurs and no lower extremity edema ABDOMEN:abdomen soft, non-tender and normal bowel sounds Musculoskeletal:no cyanosis of digits and no clubbing  PSYCH:  alert & oriented x 3 with fluent speech NEURO: no focal motor/sensory deficits Breasts: Breast inspection showed them to be symmetrical with no nipple discharge. Right breast incision site is clean and healing well.  Palpation of the left breast and axilla revealed no obvious mass that I could appreciate.   LABORATORY DATA:  I have reviewed the data as listed CBC Latest Ref Rng 05/08/2015 12/11/2014 07/23/2014  WBC 3.9 - 10.3 10e3/uL 6.4 8.0 5.3  Hemoglobin 11.6 - 15.9 g/dL 13.7 14.5 14.8  Hematocrit 34.8 - 46.6 % 42.0 42.0 43.1  Platelets 145 - 400 10e3/uL 181 192 152.0   CMP Latest Ref Rng 05/08/2015 12/11/2014 12/09/2014  Glucose 70 - 140 mg/dl 126 150(H) -  BUN 7.0 - 26.0 mg/dL 16.0 11 -  Creatinine 0.6 - 1.1 mg/dL 0.8 0.61 -  Sodium 136 - 145 mEq/L 144 139 -  Potassium 3.5 - 5.1 mEq/L 3.5 3.4(L) -  Chloride 101 - 111 mmol/L - 99(L) -  CO2 22 - 29 mEq/L 29 28 -  Calcium 8.4 - 10.4 mg/dL 9.7 9.3 -  Total Protein 6.4 - 8.3 g/dL 8.4(H) 7.5 7.5  Total Bilirubin 0.20 - 1.20 mg/dL 0.52 0.7 0.6  Alkaline Phos 40 - 150 U/L 71 69 71  AST 5 - 34 U/L _0 ALT 0 - 55 U/L _1 PATHOLOGY REPORT Diagnosis 11/15/2014 Breast, right, needle core biopsy, lower outer - DUCTAL CARCINOMA IN SITU WITH CALCIFICATIONS. - FIBROADENOMA. - SEE COMMENT. Results: IMMUNOHISTOCHEMICAL AND MORPHOMETRIC ANALYSIS PERFORMED MANUALLY Estrogen Receptor: 100%, POSITIVE, STRONG STAINING INTENSITY Progesterone Receptor: 90%, POSITIVE, STRONG STAINING INTENSITY  Diagnosis 12/17/2014 Breast, lumpectomy, Right USUAL DUCTAL HYPERPLASIA AND FIBROADENOMA BIOPSY SITE CHANGES NO RESIDUAL DUCTAL CARCINOMA IN SITU IS IDENTIFIED  RADIOGRAPHIC STUDIES: I have personally reviewed the radiological images as listed and agreed with the findings in the report.  Mm Digital Diagnostic Unilat R 11/12/2014      IMPRESSION: Grouped punctate and amorphous calcifications confirmed within the lower outer quadrant of  the right breast, 6-7 o'clock axis region, for which stereotactic biopsy is recommended.  RECOMMENDATION: Stereotactic biopsy, possibly with tomosynthesis guidance, for the right breast calcifications.  Stereotactic biopsy is scheduled for September 23rd.  I have discussed the findings and recommendations with the patient. Results were also provided in writing at the conclusion of the visit. If applicable, a reminder letter will be sent to the patient regarding the next appointment.  BI-RADS  CATEGORY  4: Suspicious.   Electronically Signed   By: Franki Cabot M.D.   On: 11/12/2014 12:09   Mm Digital Screening Bilateral 11/06/2014   ACR Breast Density Category b: There are scattered areas of fibroglandular density.  FINDINGS: In the right breast, calcifications warrant further evaluation with magnified views. In the left breast, no findings suspicious for malignancy. Images were processed with CAD.  IMPRESSION: Further evaluation is suggested for calcifications in the right breast.  RECOMMENDATION: Diagnostic mammogram of the right breast. (Code:[id number])  The patient will be contacted regarding the findings, and additional imaging will be scheduled.  BI-RADS CATEGORY  0: Incomplete. Need additional imaging evaluation and/or prior mammograms for comparison.   Electronically Signed   By: Nolon Nations M.D.   On: 11/06/2014 10:27     ASSESSMENT: 1 -year-old African-American female, with past medical history of hypertension, arthritis, chronic back pain, obesity, and the well-controlled asthma, who was found to have right breast DCIS by screening mammogram.  PLAN:  #1 right breast DCIS, ER/PR strongly positive -I reviewed her surgical pathology results, which showed no residual malignant cells.The patient had early stage disease. She  Is cured.  -Any form of adjuvant treatment is for preventive  -She is on anastrozole for chemoprevention, plan for total 5 years. She is tolerating well, we'll  continue. -Continue breast cancer surveillance with annual screening mammogram, self exam and routine follow-up. -I reviewed her lab results, CBC and a CMP are within normal limits. She is clinically doing well, no evidence of recurrence. -I encouraged her to eat healthy and accessed regularly. She is not a very active due to the back pain and arthritis, I also encouraged her to consider weight loss, she is motivated.  #2 Bone health -Repeated a bone density scan in February 2017 showed osteopenia, no high risk for fracture. I reviewed with her  -She is on multiple vitamin which contains vitamin D thousand unit -I encouraged her at calcium 600 mg once daily -I encouraged her to exercise regularly.  #3. Hypertension, diabetes, arthritis -up with her primary care physician  Plan -Continue anastrozole 1 mg once daily -I'll see her4 months, next mammogram due in September  l questions were answered. The patient knows to call the clinic with any problems, questions or concerns. I spent 20 minutes counseling the patient face to face. The total time spent in the appointment was 25 minutes and more than 50% was on counseling.     Truitt Merle, MD 05/08/2015 10:21 AM

## 2015-06-05 ENCOUNTER — Ambulatory Visit: Payer: Medicare Other | Admitting: Hematology

## 2015-06-05 ENCOUNTER — Other Ambulatory Visit: Payer: Medicare Other

## 2015-06-07 ENCOUNTER — Other Ambulatory Visit: Payer: Self-pay | Admitting: Internal Medicine

## 2015-06-19 ENCOUNTER — Other Ambulatory Visit: Payer: Self-pay | Admitting: Internal Medicine

## 2015-07-23 ENCOUNTER — Other Ambulatory Visit: Payer: Medicare Other

## 2015-07-28 ENCOUNTER — Other Ambulatory Visit: Payer: Self-pay | Admitting: Internal Medicine

## 2015-07-29 ENCOUNTER — Other Ambulatory Visit: Payer: Self-pay | Admitting: Internal Medicine

## 2015-07-30 ENCOUNTER — Encounter: Payer: Medicare Other | Admitting: Internal Medicine

## 2015-08-28 ENCOUNTER — Telehealth: Payer: Self-pay | Admitting: Hematology

## 2015-08-28 ENCOUNTER — Ambulatory Visit (HOSPITAL_BASED_OUTPATIENT_CLINIC_OR_DEPARTMENT_OTHER): Payer: Medicare Other | Admitting: Hematology

## 2015-08-28 ENCOUNTER — Other Ambulatory Visit (HOSPITAL_BASED_OUTPATIENT_CLINIC_OR_DEPARTMENT_OTHER): Payer: Medicare Other

## 2015-08-28 ENCOUNTER — Encounter: Payer: Self-pay | Admitting: Hematology

## 2015-08-28 VITALS — BP 129/61 | HR 79 | Temp 98.2°F | Resp 19 | Ht 65.0 in | Wt 258.1 lb

## 2015-08-28 DIAGNOSIS — Z170 Estrogen receptor positive status [ER+]: Secondary | ICD-10-CM

## 2015-08-28 DIAGNOSIS — I10 Essential (primary) hypertension: Secondary | ICD-10-CM | POA: Diagnosis not present

## 2015-08-28 DIAGNOSIS — C50511 Malignant neoplasm of lower-outer quadrant of right female breast: Secondary | ICD-10-CM

## 2015-08-28 DIAGNOSIS — D0511 Intraductal carcinoma in situ of right breast: Secondary | ICD-10-CM | POA: Diagnosis not present

## 2015-08-28 DIAGNOSIS — E669 Obesity, unspecified: Secondary | ICD-10-CM

## 2015-08-28 DIAGNOSIS — E119 Type 2 diabetes mellitus without complications: Secondary | ICD-10-CM

## 2015-08-28 DIAGNOSIS — E876 Hypokalemia: Secondary | ICD-10-CM

## 2015-08-28 DIAGNOSIS — M8580 Other specified disorders of bone density and structure, unspecified site: Secondary | ICD-10-CM | POA: Insufficient documentation

## 2015-08-28 LAB — COMPREHENSIVE METABOLIC PANEL
ALT: 24 U/L (ref 0–55)
AST: 24 U/L (ref 5–34)
Albumin: 3.6 g/dL (ref 3.5–5.0)
Alkaline Phosphatase: 80 U/L (ref 40–150)
Anion Gap: 11 mEq/L (ref 3–11)
BUN: 10.1 mg/dL (ref 7.0–26.0)
CO2: 29 mEq/L (ref 22–29)
Calcium: 10 mg/dL (ref 8.4–10.4)
Chloride: 101 mEq/L (ref 98–109)
Creatinine: 0.7 mg/dL (ref 0.6–1.1)
EGFR: >90 mL/min/{1.73_m2} (ref >90)
Glucose: 140 mg/dl (ref 70–140)
Potassium: 3.4 mEq/L — ABNORMAL LOW (ref 3.5–5.1)
Sodium: 141 mEq/L (ref 136–145)
Total Bilirubin: 0.57 mg/dL (ref 0.20–1.20)
Total Protein: 8.1 g/dL (ref 6.4–8.3)

## 2015-08-28 LAB — CBC WITH DIFFERENTIAL/PLATELET
BASO%: 0.3 % (ref 0.0–2.0)
Basophils Absolute: 0 10*3/uL (ref 0.0–0.1)
EOS%: 1.8 % (ref 0.0–7.0)
Eosinophils Absolute: 0.1 10*3/uL (ref 0.0–0.5)
HCT: 38.3 % (ref 34.8–46.6)
HGB: 13.1 g/dL (ref 11.6–15.9)
LYMPH%: 25 % (ref 14.0–49.7)
MCH: 31.3 pg (ref 25.1–34.0)
MCHC: 34.2 g/dL (ref 31.5–36.0)
MCV: 91.4 fL (ref 79.5–101.0)
MONO#: 0.6 10*3/uL (ref 0.1–0.9)
MONO%: 8.7 % (ref 0.0–14.0)
NEUT#: 4.5 10*3/uL (ref 1.5–6.5)
NEUT%: 64.2 % (ref 38.4–76.8)
Platelets: 176 10*3/uL (ref 145–400)
RBC: 4.19 10*6/uL (ref 3.70–5.45)
RDW: 12.9 % (ref 11.2–14.5)
WBC: 7 10*3/uL (ref 3.9–10.3)
lymph#: 1.8 10*3/uL (ref 0.9–3.3)

## 2015-08-28 NOTE — Telephone Encounter (Signed)
per pof to sch pt appt-gave pt opyof avs-sch mammo

## 2015-08-28 NOTE — Progress Notes (Signed)
Darlene Mclaughlin  Patient Care Team: Darlene Lor, MD as PCP - General Darlene Luna, MD as Consulting Physician (General Surgery) Darlene Koh, MD as Consulting Physician (Radiation Oncology) Darlene Merle, MD as Consulting Physician (Hematology) Darlene Cheese, NP as Nurse Practitioner (Hematology and Oncology)  CHIEF COMPLAINTS:  Follow up right breast DCIS  . Oncology History   Breast cancer of lower-outer quadrant of right female breast [facility])   Staging form: Breast, AJCC 7th Edition     Clinical stage from 11/15/2014: Stage 0 (Tis (DCIS), N0, M0) - Signed by Darlene Merle, MD on 11/26/2014       Breast cancer of lower-outer quadrant of right female breast ([place])   11/06/2014 Mammogram Screening mammogram showed calcifications in the lower outer quadrant of the right breast, which was confirmed by diagnostic mammogram   11/15/2014 Initial Biopsy Breast core needle biopsy showed ductal carcinoma in situ with calcifications.   11/15/2014 Receptors her2 ER+ (100%); PR+ (90%)   11/15/2014 Clinical Stage Stage 0: Tis N0   12/17/2014 Surgery Right breast lumpectomy.   12/17/2014 Pathology Results Right breast lumpectomy showed usual ductal Hyperplasia and fibroadenoma. No residual ductal carcinoma in situ.     Radiation Therapy Not recommended due to lack of residual malignancy at time of definitive surgery   03/28/2015 -  Anti-estrogen oral therapy Anastrozole 1 mg daily begun 03/28/2015   05/01/2015 Survivorship Survivorship visit completed and copy of care plan provided to patient    HISTORY OF PRESENTING ILLNESS:  Darlene Mclaughlin 65 y.o. female is here because of recent diagnosis of right breast DCIS.   The cancer was detected by screening mammogram. She does mammogram every year. The cancer was not palpable prior to diagnosis. She denies any other new symptoms.  She has chronic back pain, and had epidural injection twice which helped a lot. She also has  chronic arthritis, especially knees and feet, she takes tramadol for both back and knee pain, she takes 0-1tab/day.  She denies any other symptoms. She has good appetite and eats well. She has good energy level. She is widowed, lives alone and very independent. She has 2 sons and 3 grandchildren, and she sees him often. In terms of breast cancer risk profile:  She menarched at early age of 38 and went to menopause at age 53 (hysrectomy)  She had 2 pregnancy and 2 children, her first child was born at age 33 She did not breast-fed her child.  She received birth control pills for approximately 5 years  She was never exposed to fertility medications or hormone replacement therapy.  She has family history of Breast cancer (mother)   CURRENT THERAPY: anastrozole 61m daily started on 03/28/2015  INTERIM HISTORY: Ms  CRowereturns for follow-up. She is doing well overall. She is compliant with anastrozole and tolerates very well. She denies significant hot flash, she has chronic mild pain in right thigh, which has not changed much since she started anastrozole. No significant other arthralgia. She has good appetite and energy level, no other complaints.   MEDICAL HISTORY:  Past Medical History  Diagnosis Date  . ALLERGIC RHINITIS 05/15/2008  . ASTHMA 05/15/2008  . Headache(784.0) 05/15/2008  . HYPERTENSION 05/15/2008  . IMPAIRED GLUCOSE TOLERANCE 05/15/2008  . Obesity   . Diabetes mellitus without complication ([place]   . GERD (gastroesophageal reflux disease)   . OSTEOARTHRITIS 05/15/2008    back  . Cancer ([facility]     DCIS right breast  SURGICAL HISTORY: Past Surgical History  Procedure Laterality Date  . Cesarean section    . Abdominal hysterectomy    . Knee surgery      arthroscopic left  . Breast lumpectomy with radioactive seed localization Right 12/17/2014    Procedure: RIGHT BREAST RADIOACTIVE SEED GUIDED PARTIAL MASTECTOMY;  Surgeon: Darlene Luna, MD;  Location: [facility];  Service: General;  Laterality: Right;    SOCIAL HISTORY: Social History   Social History  . Marital Status: Widowed    Spouse Name: N/A  . Number of Children: N/A  . Years of Education: N/A   Occupational History  . Retired    Social History Main Topics  . Smoking status: Never Smoker   . Smokeless tobacco: Never Used  . Alcohol Use: Yes     Comment: RARELY  . Drug Use: No  . Sexual Activity: Not on file   Other Topics Concern  . Not on file   Social History Narrative    FAMILY HISTORY: Family History  Problem Relation Age of Onset  . Diabetes Mother   . Hypertension Mother   . Cancer Mother 77    Breast cancer twice   . Hyperlipidemia Father   . Cancer Brother     pancreatic cancer   . Diabetes Brother     ALLERGIES:  is allergic to macrobid; naproxen; and zanaflex.  MEDICATIONS:  Current Outpatient Prescriptions  Medication Sig Dispense Refill  . acetaminophen (TYLENOL) 650 MG CR tablet Take 650 mg by mouth every 8 (eight) hours as needed.      Marland Kitchen anastrozole (ARIMIDEX) 1 MG tablet Take 1 tablet (1 mg total) by mouth daily. 90 tablet 1  . aspirin 81 MG tablet Take 81 mg by mouth daily.      . chlorthalidone (HYGROTON) 25 MG tablet TAKE 1 TABLET (25 MG TOTAL) BY MOUTH DAILY. 90 tablet 1  . EPIPEN 2-PAK 0.3 MG/0.3ML SOAJ injection 0.3 mg. Reported on 05/01/2015  1  . fluticasone (FLONASE) 50 MCG/ACT nasal spray Place 1 spray into both nostrils daily. 16 g 5  . glucose blood (FREESTYLE LITE) test strip 1 each by Other route 2 (two) times daily. Use as instructed 100 each 12  . KLOR-CON M20 20 MEQ tablet TAKE 1 TABLET DAILY. 90 tablet 2  . metFORMIN (GLUCOPHAGE-XR) 500 MG 24 hr tablet TAKE 2 TABLETS (1,000 MG TOTAL) BY MOUTH DAILY WITH SUPPER. 180 tablet 3  . Multiple Vitamin (MULTIVITAMIN) tablet Take 1 tablet by mouth daily.      . NON FORMULARY WEEKLY ALLERGY INJECTIONS     . pantoprazole (PROTONIX) 40 MG tablet TAKE 1 TABLET (40 MG TOTAL) BY  MOUTH DAILY. 90 tablet 3  . PROAIR HFA 108 (90 BASE) MCG/ACT inhaler Inhale 1 puff into the lungs every 6 (six) hours as needed.   0  . traMADol (ULTRAM) 50 MG tablet TAKE 1 TABLET BY MOUTH EVERY 6 HOURS AS NEEDED FOR PAIN 50 tablet 2   No current facility-administered medications for this visit.    REVIEW OF SYSTEMS:   Constitutional: Denies fevers, chills or abnormal night sweats Eyes: Denies blurriness of vision, double vision or watery eyes Ears, nose, mouth, throat, and face: Denies mucositis or sore throat Respiratory: Denies cough, dyspnea or wheezes Cardiovascular: Denies palpitation, chest discomfort or lower extremity swelling Gastrointestinal:  Denies nausea, heartburn or change in bowel habits Skin: Denies abnormal skin rashes Lymphatics: Denies new lymphadenopathy or easy bruising Neurological:Denies numbness, tingling or new weaknesses  Behavioral/Psych: Mood is stable, no new changes  All other systems were reviewed with the patient and are negative.  PHYSICAL EXAMINATION: ECOG PERFORMANCE STATUS: 1 - Symptomatic but completely ambulatory  Filed Vitals:   08/28/15 0934  BP: 129/61  Pulse: 79  Temp: 98.2 F (36.8 C)  Resp: 19   Filed Weights   08/28/15 0934  Weight: 258 lb 1.6 oz (117.073 kg)    GENERAL:alert, no distress and comfortable SKIN: skin color, texture, turgor are normal, no rashes or significant lesions EYES: normal, conjunctiva are pink and non-injected, sclera clear OROPHARYNX:no exudate, no erythema and lips, buccal mucosa, and tongue normal  NECK: supple, thyroid normal size, non-tender, without nodularity LYMPH:  no palpable lymphadenopathy in the cervical, axillary or inguinal LUNGS: clear to auscultation and percussion with normal breathing effort HEART: regular rate & rhythm and no murmurs and no lower extremity edema ABDOMEN:abdomen soft, non-tender and normal bowel sounds Musculoskeletal:no cyanosis of digits and no clubbing  PSYCH:  alert & oriented x 3 with fluent speech NEURO: no focal motor/sensory deficits Breasts: Breast inspection showed them to be symmetrical with no nipple discharge. Right breast incision site is clean and healing well.  Palpation of the left breast and axilla revealed no obvious mass that I could appreciate.   LABORATORY DATA:  I have reviewed the data as listed CBC Latest Ref Rng 08/28/2015 05/08/2015 12/11/2014  WBC 3.9 - 10.3 10e3/uL 7.0 6.4 8.0  Hemoglobin 11.6 - 15.9 g/dL 13.1 13.7 14.5  Hematocrit 34.8 - 46.6 % 38.3 42.0 42.0  Platelets 145 - 400 10e3/uL 176 181 192   CMP Latest Ref Rng 08/28/2015 05/08/2015 12/11/2014  Glucose 70 - 140 mg/dl 140 126 150(H)  BUN 7.0 - 26.0 mg/dL 10.1 16.0 11  Creatinine 0.6 - 1.1 mg/dL 0.7 0.8 0.61  Sodium 136 - 145 mEq/L 141 144 139  Potassium 3.5 - 5.1 mEq/L 3.4(L) 3.5 3.4(L)  Chloride 101 - 111 mmol/L - - 99(L)  CO2 22 - 29 mEq/L _0 Calcium 8.4 - 10.4 mg/dL 10.0 9.7 9.3  Total Protein 6.4 - 8.3 g/dL 8.1 8.4(H) 7.5  Total Bilirubin 0.20 - 1.20 mg/dL 0.57 0.52 0.7  Alkaline Phos 40 - 150 U/L 80 71 69  AST 5 - 34 U/L _1 ALT 0 - 55 U/L _2 PATHOLOGY REPORT Diagnosis 11/15/2014 Breast, right, needle core biopsy, lower outer - DUCTAL CARCINOMA IN SITU WITH CALCIFICATIONS. - FIBROADENOMA. - SEE COMMENT. Results: IMMUNOHISTOCHEMICAL AND MORPHOMETRIC ANALYSIS PERFORMED MANUALLY Estrogen Receptor: 100%, POSITIVE, STRONG STAINING INTENSITY Progesterone Receptor: 90%, POSITIVE, STRONG STAINING INTENSITY  Diagnosis 12/17/2014 Breast, lumpectomy, Right USUAL DUCTAL HYPERPLASIA AND FIBROADENOMA BIOPSY SITE CHANGES NO RESIDUAL DUCTAL CARCINOMA IN SITU IS IDENTIFIED  RADIOGRAPHIC STUDIES: I have personally reviewed the radiological images as listed and agreed with the findings in the report.  Mm Digital Diagnostic Unilat R 11/12/2014      IMPRESSION: Grouped punctate and amorphous calcifications confirmed within the lower outer  quadrant of the right breast, 6-7 o'clock axis region, for which stereotactic biopsy is recommended.  RECOMMENDATION: Stereotactic biopsy, possibly with tomosynthesis guidance, for the right breast calcifications.  Stereotactic biopsy is scheduled for September 23rd.  I have discussed the findings and recommendations with the patient. Results were also provided in writing at the conclusion of the visit. If applicable, a reminder letter will be sent to the patient regarding the next appointment.  BI-RADS CATEGORY  4: Suspicious.  Electronically Signed   By: Franki Cabot M.D.   On: 11/12/2014 12:09   Mm Digital Screening Bilateral 11/06/2014   ACR Breast Density Category b: There are scattered areas of fibroglandular density.  FINDINGS: In the right breast, calcifications warrant further evaluation with magnified views. In the left breast, no findings suspicious for malignancy. Images were processed with CAD.  IMPRESSION: Further evaluation is suggested for calcifications in the right breast.  RECOMMENDATION: Diagnostic mammogram of the right breast. (Code:[id number])  The patient will be contacted regarding the findings, and additional imaging will be scheduled.  BI-RADS CATEGORY  0: Incomplete. Need additional imaging evaluation and/or prior mammograms for comparison.   Electronically Signed   By: Nolon Nations M.D.   On: 11/06/2014 10:27     ASSESSMENT: 33 -year-old African-American female, with past medical history of hypertension, arthritis, chronic back pain, obesity, and the well-controlled asthma, who was found to have right breast DCIS by screening mammogram.  PLAN:  #1 right breast DCIS, ER/PR strongly positive -I reviewed her surgical pathology results, which showed no residual malignant cells.The patient had early stage disease. She  Is cured.  -Any form of adjuvant treatment is for preventive  -She is on anastrozole for chemoprevention, plan for total 5 years. She is tolerating well, we'll  continue. -Continue breast cancer surveillance with annual screening mammogram, self exam and routine follow-up. -I reviewed her lab results, CBC and a CMP are within normal limits. She is clinically doing well, no evidence of recurrence. -I encouraged her to eat healthy and accessed regularly. She is not a very active due to the back pain and arthritis, I also encouraged her to consider weight loss, she is motivated. -She is due for mammogram in Sep, I'll order for her today  #2 Bone health -Repeated a bone density scan in February 2017 showed osteopenia, no high risk for fracture. I reviewed with her  -She is on multiple vitamin which contains vitamin D thousand unit -I encouraged her at calcium 600 mg once daily -I encouraged her to exercise regularly.  #3. Hypertension, diabetes, arthritis -up with her primary care physician  #4 hypokalemia -Secondary to chlorthalidone  -She'll continue potassium 1 tablet day, and try to eat potassium-rich foods such as bananas.  Plan -Continue anastrozole 1 mg once daily -I'll see her 6 months, next mammogram due in September, will order for her   l questions were answered. The patient knows to call the clinic with any problems, questions or concerns. I spent 20 minutes counseling the patient face to face. The total time spent in the appointment was 25 minutes and more than 50% was on counseling.     Darlene Merle, MD 08/28/2015 9:55 AM

## 2015-09-16 ENCOUNTER — Other Ambulatory Visit (INDEPENDENT_AMBULATORY_CARE_PROVIDER_SITE_OTHER): Payer: Medicare Other

## 2015-09-16 DIAGNOSIS — Z0000 Encounter for general adult medical examination without abnormal findings: Secondary | ICD-10-CM | POA: Diagnosis not present

## 2015-09-16 LAB — CBC WITH DIFFERENTIAL/PLATELET
Basophils Absolute: 0 10*3/uL (ref 0.0–0.1)
Basophils Relative: 0.3 % (ref 0.0–3.0)
Eosinophils Absolute: 0.1 10*3/uL (ref 0.0–0.7)
Eosinophils Relative: 1.6 % (ref 0.0–5.0)
HCT: 38.9 % (ref 36.0–46.0)
Hemoglobin: 13.1 g/dL (ref 12.0–15.0)
Lymphocytes Relative: 21.7 % (ref 12.0–46.0)
Lymphs Abs: 1.6 10*3/uL (ref 0.7–4.0)
MCHC: 33.7 g/dL (ref 30.0–36.0)
MCV: 92.6 fl (ref 78.0–100.0)
Monocytes Absolute: 0.5 10*3/uL (ref 0.1–1.0)
Monocytes Relative: 6.9 % (ref 3.0–12.0)
Neutro Abs: 5.2 10*3/uL (ref 1.4–7.7)
Neutrophils Relative %: 69.5 % (ref 43.0–77.0)
Platelets: 181 10*3/uL (ref 150.0–400.0)
RBC: 4.2 Mil/uL (ref 3.87–5.11)
RDW: 13.4 % (ref 11.5–15.5)
WBC: 7.5 10*3/uL (ref 4.0–10.5)

## 2015-09-16 LAB — BASIC METABOLIC PANEL
BUN: 10 mg/dL (ref 6–23)
CO2: 31 mEq/L (ref 19–32)
Calcium: 9.9 mg/dL (ref 8.4–10.5)
Chloride: 99 mEq/L (ref 96–112)
Creatinine, Ser: 0.66 mg/dL (ref 0.40–1.20)
GFR: 114.9 mL/min (ref >60.00)
Glucose, Bld: 132 mg/dL — ABNORMAL HIGH (ref 70–99)
Potassium: 3.5 mEq/L (ref 3.5–5.1)
Sodium: 139 mEq/L (ref 135–145)

## 2015-09-16 LAB — POC URINALSYSI DIPSTICK (AUTOMATED)
Bilirubin, UA: NEGATIVE
Blood, UA: NEGATIVE
Glucose, UA: NEGATIVE
Ketones, UA: NEGATIVE
Nitrite, UA: NEGATIVE
Protein, UA: NEGATIVE
Spec Grav, UA: 1.015
Urobilinogen, UA: 1
pH, UA: 8.5

## 2015-09-16 LAB — HEPATIC FUNCTION PANEL
ALT: 19 U/L (ref 0–35)
AST: 20 U/L (ref 0–37)
Albumin: 3.9 g/dL (ref 3.5–5.2)
Alkaline Phosphatase: 69 U/L (ref 39–117)
Bilirubin, Direct: 0.1 mg/dL (ref 0.0–0.3)
Total Bilirubin: 0.6 mg/dL (ref 0.2–1.2)
Total Protein: 7.7 g/dL (ref 6.0–8.3)

## 2015-09-16 LAB — LIPID PANEL
Cholesterol: 195 mg/dL (ref 0–200)
HDL: 48.5 mg/dL (ref >39.00)
LDL Cholesterol: 130 mg/dL — ABNORMAL HIGH (ref 0–99)
NonHDL: 146.29
Total CHOL/HDL Ratio: 4
Triglycerides: 83 mg/dL (ref 0.0–149.0)
VLDL: 16.6 mg/dL (ref 0.0–40.0)

## 2015-09-16 LAB — TSH: TSH: 1.53 u[IU]/mL (ref 0.35–4.50)

## 2015-09-16 LAB — MICROALBUMIN / CREATININE URINE RATIO
Creatinine,U: 43.4 mg/dL
Microalb Creat Ratio: 1.6 mg/g (ref 0.0–30.0)
Microalb, Ur: <0.7 mg/dL (ref 0.0–1.9)

## 2015-09-16 LAB — HEMOGLOBIN A1C: Hgb A1c MFr Bld: 6.4 % (ref 4.6–6.5)

## 2015-09-17 ENCOUNTER — Other Ambulatory Visit: Payer: Self-pay | Admitting: Hematology

## 2015-09-17 DIAGNOSIS — C50511 Malignant neoplasm of lower-outer quadrant of right female breast: Secondary | ICD-10-CM

## 2015-09-22 ENCOUNTER — Ambulatory Visit (INDEPENDENT_AMBULATORY_CARE_PROVIDER_SITE_OTHER): Payer: Medicare Other | Admitting: Internal Medicine

## 2015-09-22 ENCOUNTER — Encounter: Payer: Self-pay | Admitting: Internal Medicine

## 2015-09-22 VITALS — BP 118/62 | HR 93 | Temp 98.8°F | Ht 63.0 in | Wt 255.2 lb

## 2015-09-22 DIAGNOSIS — E119 Type 2 diabetes mellitus without complications: Secondary | ICD-10-CM

## 2015-09-22 DIAGNOSIS — I10 Essential (primary) hypertension: Secondary | ICD-10-CM

## 2015-09-22 DIAGNOSIS — C50511 Malignant neoplasm of lower-outer quadrant of right female breast: Secondary | ICD-10-CM

## 2015-09-22 DIAGNOSIS — E669 Obesity, unspecified: Secondary | ICD-10-CM | POA: Diagnosis not present

## 2015-09-22 DIAGNOSIS — Z0000 Encounter for general adult medical examination without abnormal findings: Secondary | ICD-10-CM | POA: Diagnosis not present

## 2015-09-22 NOTE — Patient Instructions (Signed)
Limit your sodium (Salt) intake   Please check your hemoglobin A1c every 6 months    It is important that you exercise regularly, at least 20 minutes 3 to 4 times per week.  If you develop chest pain or shortness of breath seek  medical attention.  You need to lose weight.  Consider a lower calorie diet and regular exercise.  Follow-up oncology as scheduled  Please see your eye doctor yearly to check for diabetic eye damage

## 2015-09-22 NOTE — Progress Notes (Signed)
Subjective:    Patient ID: Darlene Mclaughlin, female    DOB: 1950-04-28, 65 y.o.   MRN: [id number]  HPI  25 -year-old patient who is seen today for a preventive health examination   She remains on metformin therapy for type 2 diabetes.  Lab Results  Component Value Date   HGBA1C 6.4 09/16/2015   She is followed closely by oncology and general surgery due to a history of breast cancer.  She has a long history of lumbar spinal stenosis.  Lumbar MRI was reviewed from 2007.  The patient walks with a cane and continues to have back pain.  She states that she is scheduled for a epidural later this week  She was evaluated by GI about one year ago for elevated liver function studies.  This was thought secondary to an adverse drug effect ( Macrobid or Zanaflex)  Past Medical History:  Diagnosis Date  . ALLERGIC RHINITIS 05/15/2008  . ASTHMA 05/15/2008  . Cancer [facility])    DCIS right breast  . Diabetes mellitus without complication ([place])   . GERD (gastroesophageal reflux disease)   . Headache(784.0) 05/15/2008  . HYPERTENSION 05/15/2008  . IMPAIRED GLUCOSE TOLERANCE 05/15/2008  . Obesity   . OSTEOARTHRITIS 05/15/2008   back    Social History   Social History  . Marital status: Widowed    Spouse name: N/A  . Number of children: N/A  . Years of education: N/A   Occupational History  . Retired    Social History Main Topics  . Smoking status: Never Smoker  . Smokeless tobacco: Never Used  . Alcohol use Yes     Comment: RARELY  . Drug use: No  . Sexual activity: Not on file   Other Topics Concern  . Not on file   Social History Narrative  . No narrative on file    Past Surgical History:  Procedure Laterality Date  . ABDOMINAL HYSTERECTOMY    . BREAST LUMPECTOMY WITH RADIOACTIVE SEED LOCALIZATION Right 12/17/2014   Procedure: RIGHT BREAST RADIOACTIVE SEED GUIDED PARTIAL MASTECTOMY;  Surgeon: Erroll Luna, MD;  Location: [facility];  Service: General;   Laterality: Right;  . CESAREAN SECTION    . KNEE SURGERY     arthroscopic left    Family History  Problem Relation Age of Onset  . Diabetes Mother   . Hypertension Mother   . Cancer Mother 91    Breast cancer twice   . Hyperlipidemia Father   . Cancer Brother     pancreatic cancer   . Diabetes Brother     Allergies  Allergen Reactions  . Macrobid [Nitrofurantoin Monohyd Macro]      Liver problems  . Naproxen   . Zanaflex [Tizanidine Hcl] Other (See Comments)     Liver problems    Current Outpatient Prescriptions on File Prior to Visit  Medication Sig Dispense Refill  . acetaminophen (TYLENOL) 650 MG CR tablet Take 650 mg by mouth every 8 (eight) hours as needed.      Marland Kitchen anastrozole (ARIMIDEX) 1 MG tablet Take 1 tablet (1 mg total) by mouth daily. 90 tablet 3  . aspirin 81 MG tablet Take 81 mg by mouth daily.      . chlorthalidone (HYGROTON) 25 MG tablet TAKE 1 TABLET (25 MG TOTAL) BY MOUTH DAILY. 90 tablet 1  . EPIPEN 2-PAK 0.3 MG/0.3ML SOAJ injection 0.3 mg. Reported on 05/01/2015  1  . fluticasone (FLONASE) 50 MCG/ACT nasal spray Place 1 spray into  both nostrils daily. 16 g 5  . glucose blood (FREESTYLE LITE) test strip 1 each by Other route 2 (two) times daily. Use as instructed 100 each 12  . KLOR-CON M20 20 MEQ tablet TAKE 1 TABLET DAILY. 90 tablet 2  . metFORMIN (GLUCOPHAGE-XR) 500 MG 24 hr tablet TAKE 2 TABLETS (1,000 MG TOTAL) BY MOUTH DAILY WITH SUPPER. 180 tablet 3  . Multiple Vitamin (MULTIVITAMIN) tablet Take 1 tablet by mouth daily.      . NON FORMULARY WEEKLY ALLERGY INJECTIONS     . pantoprazole (PROTONIX) 40 MG tablet TAKE 1 TABLET (40 MG TOTAL) BY MOUTH DAILY. 90 tablet 3  . PROAIR HFA 108 (90 BASE) MCG/ACT inhaler Inhale 1 puff into the lungs every 6 (six) hours as needed.   0  . traMADol (ULTRAM) 50 MG tablet TAKE 1 TABLET BY MOUTH EVERY 6 HOURS AS NEEDED FOR PAIN 50 tablet 2   No current facility-administered medications on file prior to visit.     BP  118/62 (BP Location: Left Arm, Patient Position: Sitting, Cuff Size: Large)   Pulse 93   Temp 98.8 F (37.1 C) (Oral)   Ht 5\' 3"  (1.6 m)   Wt 255 lb 3.2 oz (115.8 kg)   SpO2 98%   BMI 45.21 kg/m    1. Risk factors, based on past  M,S,F history.  Cardiovascular risk factors include hypertension, type 2 diabetes  2.  Physical activities: Walks with a cane fairly sedentary.  Complains of unsteady gait.  [facility]  3.  Depression/mood: No history of major depression or mood disorder  4.  Hearing: No deficits  5.  ADL's: Independent  6.  Fall risk: Moderate to high.  Walks with a cane  7.  Home safety: No problems identified  8.  Height weight, and visual acuity; height and weight stable no change in visual acuity  9.  Counseling: Heart healthy diet.  Annual eye examination.  All encouraged  10. Lab orders based on risk factors: Laboratory studies reviewed including hemoglobin A1c  11. Referral : Neurosurgical referral for evaluation of spinal stenosis  12. Care plan: Heart healthy diet, weight loss.  More activity.  All encouraged.   13. Cognitive assessment: Alert in order with normal affect.  No cognitive dysfunction  14. Screening: Annual eye examinations recommended.  The patient will continue to have colonoscopies at 10 year intervals.  Will consider bone density studies every 2 or 3 years  15. Provider List Update: Includes primary care medicine, neurosurgery radiology and ophthalmology as well as general surgery and oncology     Review of Systems  Constitutional: Positive for activity change, appetite change and fatigue.  HENT: Negative for congestion, dental problem, hearing loss, rhinorrhea, sinus pressure, sore throat and tinnitus.   Eyes: Negative for pain, discharge and visual disturbance.  Respiratory: Negative for cough and shortness of breath.   Cardiovascular: Negative for chest pain, palpitations and leg  swelling.  Gastrointestinal: Positive for abdominal pain. Negative for abdominal distention, blood in stool, constipation, diarrhea, nausea and vomiting.  Genitourinary: Negative for difficulty urinating, dysuria, flank pain, frequency, hematuria, pelvic pain, urgency, vaginal bleeding, vaginal discharge and vaginal pain.  Musculoskeletal: Negative for arthralgias, gait problem and joint swelling.  Skin: Negative for rash.  Neurological: Positive for weakness. Negative for dizziness, syncope, speech difficulty, numbness and headaches.  Hematological: Negative for adenopathy.  Psychiatric/Behavioral: Negative for agitation, behavioral problems and dysphoric mood. The  patient is not nervous/anxious.        Objective:   Physical Exam  Constitutional: She is oriented to person, place, and time. She appears well-developed and well-nourished.  Obese No distress Blood pressure well controlled  HENT:  Head: Normocephalic and atraumatic.  Right Ear: External ear normal.  Left Ear: External ear normal.  Mouth/Throat: Oropharynx is clear and moist.  Eyes: Conjunctivae and EOM are normal.  Neck: Normal range of motion. Neck supple. No JVD present. No thyromegaly present.  Cardiovascular: Normal rate, regular rhythm, normal heart sounds and intact distal pulses.   No murmur heard. Pulmonary/Chest: Effort normal and breath sounds normal. She has no wheezes. She has no rales.  Abdominal: Soft. Bowel sounds are normal. She exhibits no distension and no mass. There is no tenderness. There is no rebound and no guarding.  Obese, soft and nontender.  No organomegaly Lower abdominal surgical scars  Genitourinary: Vagina normal.  Musculoskeletal: Normal range of motion. She exhibits no edema or tenderness.  Neurological: She is alert and oriented to person, place, and time. She has normal reflexes. No cranial nerve deficit. She exhibits normal muscle tone. Coordination normal.  Skin: Skin is warm and dry.  No rash noted.  Psychiatric: She has a normal mood and affect. Her behavior is normal.          Assessment & Plan:   Preventive health examination  Diabetes, stable.  Ophthalmology follow-up as scheduled.  Recheck him go an A1c 6 months Essential hypertension, well-controlled History right breast cancer.  Follow-up oncology and general surgery.  Continue annual mammograms  Nyoka Cowden, MD  Lumbar spinal stenosis.

## 2015-09-22 NOTE — Progress Notes (Signed)
Pre visit review using our clinic review tool, if applicable. No additional management support is needed unless otherwise documented below in the visit note. 

## 2015-10-31 LAB — HM DIABETES EYE EXAM

## 2015-11-06 ENCOUNTER — Ambulatory Visit
Admission: RE | Admit: 2015-11-06 | Discharge: 2015-11-06 | Disposition: A | Discharge status: Home / Self Care | Payer: Medicare Other | Source: Ambulatory Visit | Attending: Hematology | Admitting: Hematology

## 2015-11-06 DIAGNOSIS — C50511 Malignant neoplasm of lower-outer quadrant of right female breast: Secondary | ICD-10-CM

## 2015-11-20 ENCOUNTER — Encounter: Payer: Self-pay | Admitting: Internal Medicine

## 2015-12-07 ENCOUNTER — Other Ambulatory Visit: Payer: Self-pay | Admitting: Internal Medicine

## 2015-12-25 ENCOUNTER — Other Ambulatory Visit: Payer: Self-pay | Admitting: Internal Medicine

## 2016-02-25 NOTE — Progress Notes (Signed)
[name] FOLLOW UP NOTE  Patient Care Team: Marletta Lor, MD as PCP - General Erroll Luna, MD as Consulting Physician (General Surgery) Arloa Koh, MD as Consulting Physician (Radiation Oncology) Truitt Merle, MD as Consulting Physician (Hematology) Sylvan Cheese, NP as Nurse Practitioner (Hematology and Oncology)  CHIEF COMPLAINTS:  Follow up right breast DCIS  . 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      11/15/2014 Receptors her2    ER+ (100%); PR+ (90%)      11/15/2014 Clinical Stage    Stage 0: Tis N0      12/17/2014 Surgery    Right breast lumpectomy.      12/17/2014 Pathology Results    Right breast lumpectomy showed usual ductal Hyperplasia and fibroadenoma. No residual ductal carcinoma in situ.        Radiation Therapy    Not recommended due to lack of residual malignancy at time of definitive surgery      03/28/2015 -  Anti-estrogen oral therapy    Anastrozole 1 mg daily begun 03/28/2015      05/01/2015 Survivorship    Survivorship visit completed and copy of care plan provided to patient      11/06/2015 Imaging    MM DIAG BREAST TOMO BILATERAL 11/06/15 IMPRESSION: No mammographic evidence of malignancy. BI-RADS CATEGORY  2: Benign.       HISTORY OF PRESENTING ILLNESS:  Darlene Mclaughlin 66 y.o. female is here because of recent diagnosis of right breast DCIS.   The cancer was detected by screening  mammogram. She does mammogram every year. The cancer was not palpable prior to diagnosis. She denies any other new symptoms.  She has chronic back pain, and had epidural injection twice which helped a lot. She also has chronic arthritis, especially knees and feet, she takes tramadol for both back and knee pain, she takes 0-1tab/day.  She denies any other symptoms. She has good appetite and eats well. She has good energy level. She is widowed, lives alone and very independent. She has 2 sons and 3 grandchildren, and she sees him often. In terms of breast cancer risk profile:  She menarched at early age of 9 and went to menopause at age 53 (hysrectomy)  She had 2 pregnancy and 2 children, her first child was born at age 30 She did not breast-fed her child.  She received birth control pills for approximately 5 years  She was never exposed to fertility medications or hormone replacement therapy.  She has family history of Breast cancer (mother)   CURRENT THERAPY: anastrozole 26m daily started on 03/28/2015  INTERIM HISTORY: Darlene  CKnolesreturns for follow-up. She is doing well overall, but is grieving the loss of her brother. He passed after Thanksgiving. She attributes her high BP today to stress. Denies headaches or chest tightness. She is compliant with anastrozole and tolerates very well. She denies significant hot flash,  denies significant arthralgia. She has good appetite and energy level, no other complaints.  MEDICAL HISTORY:  Past Medical History:  Diagnosis Date  . ALLERGIC RHINITIS 05/15/2008  . ASTHMA 05/15/2008  . Cancer [facility])    DCIS right breast  . Diabetes mellitus without complication ([place])   . GERD (gastroesophageal reflux disease)   . Headache(784.0) 05/15/2008  . HYPERTENSION 05/15/2008  . IMPAIRED GLUCOSE TOLERANCE 05/15/2008  . Obesity   . OSTEOARTHRITIS 05/15/2008   back    SURGICAL HISTORY: Past Surgical History:  Procedure Laterality Date  . ABDOMINAL HYSTERECTOMY     . BREAST LUMPECTOMY WITH RADIOACTIVE SEED LOCALIZATION Right 12/17/2014   Procedure: RIGHT BREAST RADIOACTIVE SEED GUIDED PARTIAL MASTECTOMY;  Surgeon: Erroll Luna, MD;  Location: [place];  Service: General;  Laterality: Right;  . CESAREAN SECTION    . KNEE SURGERY     arthroscopic left    SOCIAL HISTORY: Social History   Social History  . Marital status: Widowed    Spouse name: N/A  . Number of children: N/A  . Years of education: N/A   Occupational History  . Retired    Social History Main Topics  . Smoking status: Never Smoker  . Smokeless tobacco: Never Used  . Alcohol use Yes     Comment: RARELY  . Drug use: No  . Sexual activity: Not on file   Other Topics Concern  . Not on file   Social History Narrative  . No narrative on file    FAMILY HISTORY: Family History  Problem Relation Age of Onset  . Diabetes Mother   . Hypertension Mother   . Cancer Mother 97    Breast cancer twice   . Hyperlipidemia Father   . Cancer Brother     pancreatic cancer   . Diabetes Brother     ALLERGIES:  is allergic to macrobid [nitrofurantoin monohyd macro]; naproxen; and zanaflex [tizanidine hcl].  MEDICATIONS:  Current Outpatient Prescriptions  Medication Sig Dispense Refill  . acetaminophen (TYLENOL) 650 MG CR tablet Take 650 mg by mouth every 8 (eight) hours as needed.      Marland Kitchen anastrozole (ARIMIDEX) 1 MG tablet Take 1 tablet (1 mg total) by mouth daily. 90 tablet 3  . aspirin 81 MG tablet Take 81 mg by mouth daily.      . chlorthalidone (HYGROTON) 25 MG tablet TAKE 1 TABLET (25 MG TOTAL) BY MOUTH DAILY. 90 tablet 1  . EPIPEN 2-PAK 0.3 MG/0.3ML SOAJ injection 0.3 mg. Reported on 05/01/2015  1  . fluticasone (FLONASE) 50 MCG/ACT nasal spray Place 1 spray into both nostrils daily. 16 g 5  . glucose blood (FREESTYLE LITE) test strip 1 each by Other route 2 (two) times daily. Use as instructed 100 each 12  . KLOR-CON M20 20 MEQ tablet TAKE 1 TABLET DAILY.  90 tablet 2  . metFORMIN (GLUCOPHAGE-XR) 500 MG 24 hr tablet TAKE 2 TABLETS (1,000 MG TOTAL) BY MOUTH DAILY WITH SUPPER. 180 tablet 3  . Multiple Vitamin (MULTIVITAMIN) tablet Take 1 tablet by mouth daily.      . NON FORMULARY WEEKLY ALLERGY INJECTIONS     . pantoprazole (PROTONIX) 40 MG tablet TAKE 1 TABLET (40 MG TOTAL) BY MOUTH DAILY. 90 tablet 3  . PROAIR HFA 108 (90 BASE) MCG/ACT inhaler Inhale 1 puff into the lungs every 6 (six) hours as needed.   0  . traMADol (ULTRAM) 50 MG tablet TAKE 1 TABLET BY MOUTH EVERY 6 HOURS AS NEEDED FOR  PAIN 50 tablet 2   No current facility-administered medications for this visit.     REVIEW OF SYSTEMS:   Constitutional: Denies fevers, chills or abnormal night sweats Eyes: Denies blurriness of vision, double vision or watery eyes Ears, nose, mouth, throat, and face: Denies mucositis or sore throat Respiratory: Denies cough, dyspnea or wheezes Cardiovascular: Denies palpitation, chest discomfort or lower extremity swelling Gastrointestinal:  Denies nausea, heartburn or change in bowel habits Skin: Denies abnormal skin rashes Lymphatics: Denies new lymphadenopathy or easy bruising Neurological:Denies numbness, tingling or new weaknesses Behavioral/Psych: Mood is stable, no new changes  All other systems were reviewed with the patient and are negative.  PHYSICAL EXAMINATION: ECOG PERFORMANCE STATUS: 1 - Symptomatic but completely ambulatory  Vitals:   03/01/16 0849  BP: (!) 156/72  Pulse: (!) 105  Resp: 20  Temp: 98.8 F (37.1 C)   Filed Weights   03/01/16 0849  Weight: 256 lb 14.4 oz (116.5 kg)    GENERAL:alert, no distress and comfortable SKIN: skin color, texture, turgor are normal, no rashes or significant lesions EYES: normal, conjunctiva are pink and non-injected, sclera clear OROPHARYNX:no exudate, no erythema and lips, buccal mucosa, and tongue normal  NECK: supple, thyroid normal size, non-tender, without nodularity LYMPH:  no  palpable lymphadenopathy in the cervical, axillary or inguinal LUNGS: clear to auscultation and percussion with normal breathing effort HEART: regular rate & rhythm and no murmurs and no lower extremity edema ABDOMEN:abdomen soft, non-tender and normal bowel sounds Musculoskeletal:no cyanosis of digits and no clubbing  PSYCH: alert & oriented x 3 with fluent speech NEURO: no focal motor/sensory deficits Breasts: Breast inspection showed them to be symmetrical with no nipple discharge. Right breast incision site is clean and has healed well.  Palpation of the left breast and axilla revealed no obvious mass that I could appreciate.   LABORATORY DATA:  I have reviewed the data as listed CBC Latest Ref Rng & Units 03/01/2016 09/16/2015 08/28/2015  WBC 3.9 - 10.3 10e3/uL 7.4 7.5 7.0  Hemoglobin 11.6 - 15.9 g/dL 13.4 13.1 13.1  Hematocrit 34.8 - 46.6 % 39.5 38.9 38.3  Platelets 145 - 400 10e3/uL 197 181.0 176   CMP Latest Ref Rng & Units 03/01/2016 09/16/2015 08/28/2015  Glucose 70 - 140 mg/dl 143(H) 132(H) 140  BUN 7.0 - 26.0 mg/dL 10.3 10 10.1  Creatinine 0.6 - 1.1 mg/dL 0.7 0.66 0.7  Sodium 136 - 145 mEq/L 141 139 141  Potassium 3.5 - 5.1 mEq/L 3.3(L) 3.5 3.4(L)  Chloride 96 - 112 mEq/L - 99 -  CO2 22 - 29 mEq/L 27 31 29   Calcium 8.4 - 10.4 mg/dL 10.1 9.9 10.0  Total Protein 6.4 - 8.3 g/dL 8.0 7.7 8.1  Total Bilirubin 0.20 - 1.20 mg/dL 0.55 0.6 0.57  Alkaline Phos 40 - 150 U/L 84 69 80  AST 5 - 34 U/L 17 20 24   ALT 0 - 55 U/L 15 19 24       PATHOLOGY REPORT Diagnosis 11/15/2014 Breast, right, needle core biopsy, lower outer - DUCTAL CARCINOMA IN SITU WITH CALCIFICATIONS. - FIBROADENOMA. - SEE COMMENT. Results: IMMUNOHISTOCHEMICAL AND MORPHOMETRIC ANALYSIS PERFORMED MANUALLY Estrogen Receptor: 100%, POSITIVE, STRONG STAINING INTENSITY Progesterone Receptor: 90%, POSITIVE, STRONG STAINING INTENSITY  Diagnosis 12/17/2014 Breast, lumpectomy, Right USUAL DUCTAL HYPERPLASIA AND  FIBROADENOMA BIOPSY SITE CHANGES NO RESIDUAL DUCTAL CARCINOMA IN SITU IS IDENTIFIED  RADIOGRAPHIC STUDIES: I have personally reviewed the radiological images as listed and agreed with the findings in the report.  MM DIAG BREAST TOMO  BILATERAL 11/06/15 IMPRESSION: No mammographic evidence of malignancy. BI-RADS CATEGORY  2: Benign.  Mm Digital Diagnostic Unilat R 11/12/2014      IMPRESSION: Grouped punctate and amorphous calcifications confirmed within the lower outer quadrant of the right breast, 6-7 o'clock axis region, for which stereotactic biopsy is recommended.  RECOMMENDATION: Stereotactic biopsy, possibly with tomosynthesis guidance, for the right breast calcifications.  Stereotactic biopsy is scheduled for September 23rd.  I have discussed the findings and recommendations with the patient. Results were also provided in writing at the conclusion of the visit. If applicable, a reminder letter will be sent to the patient regarding the next appointment.  BI-RADS CATEGORY  4: Suspicious.   Electronically Signed   By: Franki Cabot M.D.   On: 11/12/2014 12:09   Mm Digital Screening Bilateral 11/06/2014   ACR Breast Density Category b: There are scattered areas of fibroglandular density.  FINDINGS: In the right breast, calcifications warrant further evaluation with magnified views. In the left breast, no findings suspicious for malignancy. Images were processed with CAD.  IMPRESSION: Further evaluation is suggested for calcifications in the right breast.  RECOMMENDATION: Diagnostic mammogram of the right breast. (Code:[id number])  The patient will be contacted regarding the findings, and additional imaging will be scheduled.  BI-RADS CATEGORY  0: Incomplete. Need additional imaging evaluation and/or prior mammograms for comparison.   Electronically Signed   By: Nolon Nations M.D.   On: 11/06/2014 10:27     ASSESSMENT: 66 y.o. African-American female, with past medical history of hypertension,  arthritis, chronic back pain, obesity, and the well-controlled asthma, who was found to have right breast DCIS by screening mammogram.  PLAN:  #1 right breast DCIS, ER/PR strongly positive -I reviewed her surgical pathology results, which showed no residual malignant cells.The patient had early stage disease. She  Is cured.  -Any form of adjuvant treatment is for preventive  -She is on anastrozole for chemoprevention, plan for total 5 years. She is tolerating well, we'll continue. -Continue breast cancer surveillance with annual screening mammogram, self exam and routine follow-up. -I reviewed her lab results, CBC and a CMP are within normal limits. She is clinically doing well, no evidence of recurrence. -I encouraged her to eat healthy and accessed regularly. She is not a very active due to the back pain and arthritis, I also encouraged her to consider weight loss, she is motivated. -Mammogram in 10/2015 was negative, continue screening mammogram once a year.  #2 Osteopenia  -Repeated a bone density scan in February 2017 showed osteopenia, no high risk for fracture. I reviewed with her  -She is on multiple vitamin which contains vitamin D thousand unit -I encouraged her at calcium 600 mg once daily -I encouraged her to exercise regularly, she does water exercise   #3. Hypertension, diabetes, arthritis -up with her primary care physician  #4 hypokalemia -Secondary to chlorthalidone  -She'll continue potassium 1 tablet day, and try to eat potassium-rich foods such as bananas. -The patient's potassium on 03/01/16 was 3.3. The patient states she is taking 1 tablet a day, I advised her to now take 2 a day for the next 5 days then continue once daily   #5 Obesity  -Encouraged exercise and diet.  Plan -Continue anastrozole 1 mg once daily -I'll see her 6 months with lab. -Advised the patient to take extra potassium table for the next 5 days and weight loss management.  l questions were  answered. The patient knows to call the clinic with any problems,  questions or concerns. I spent 20 minutes counseling the patient face to face. The total time spent in the appointment was 25 minutes and more than 50% was on counseling.     Truitt Merle, MD 03/01/16 9:26 AM   This document serves as a record of services personally performed by Truitt Merle, MD. It was created on her behalf by Darcus Austin, a trained medical scribe. The creation of this record is based on the scribe's personal observations and the provider's statements to them. This document has been checked and approved by the attending provider.

## 2016-03-01 ENCOUNTER — Telehealth: Payer: Self-pay | Admitting: Hematology

## 2016-03-01 ENCOUNTER — Other Ambulatory Visit (HOSPITAL_BASED_OUTPATIENT_CLINIC_OR_DEPARTMENT_OTHER): Payer: Medicare Other

## 2016-03-01 ENCOUNTER — Ambulatory Visit (HOSPITAL_BASED_OUTPATIENT_CLINIC_OR_DEPARTMENT_OTHER): Payer: Medicare Other | Admitting: Hematology

## 2016-03-01 ENCOUNTER — Encounter: Payer: Self-pay | Admitting: Hematology

## 2016-03-01 VITALS — BP 156/72 | HR 105 | Temp 98.8°F | Resp 20 | Wt 256.9 lb

## 2016-03-01 DIAGNOSIS — E876 Hypokalemia: Secondary | ICD-10-CM

## 2016-03-01 DIAGNOSIS — D0511 Intraductal carcinoma in situ of right breast: Secondary | ICD-10-CM

## 2016-03-01 DIAGNOSIS — E119 Type 2 diabetes mellitus without complications: Secondary | ICD-10-CM

## 2016-03-01 DIAGNOSIS — Z170 Estrogen receptor positive status [ER+]: Secondary | ICD-10-CM

## 2016-03-01 DIAGNOSIS — IMO0001 Reserved for inherently not codable concepts without codable children: Secondary | ICD-10-CM

## 2016-03-01 DIAGNOSIS — C50511 Malignant neoplasm of lower-outer quadrant of right female breast: Secondary | ICD-10-CM

## 2016-03-01 DIAGNOSIS — Z79811 Long term (current) use of aromatase inhibitors: Secondary | ICD-10-CM

## 2016-03-01 DIAGNOSIS — I10 Essential (primary) hypertension: Secondary | ICD-10-CM

## 2016-03-01 DIAGNOSIS — E669 Obesity, unspecified: Secondary | ICD-10-CM | POA: Diagnosis not present

## 2016-03-01 DIAGNOSIS — M8580 Other specified disorders of bone density and structure, unspecified site: Secondary | ICD-10-CM | POA: Diagnosis not present

## 2016-03-01 LAB — COMPREHENSIVE METABOLIC PANEL
ALT: 15 U/L (ref 0–55)
AST: 17 U/L (ref 5–34)
Albumin: 3.9 g/dL (ref 3.5–5.0)
Alkaline Phosphatase: 84 U/L (ref 40–150)
Anion Gap: 11 mEq/L (ref 3–11)
BUN: 10.3 mg/dL (ref 7.0–26.0)
CO2: 27 mEq/L (ref 22–29)
Calcium: 10.1 mg/dL (ref 8.4–10.4)
Chloride: 103 mEq/L (ref 98–109)
Creatinine: 0.7 mg/dL (ref 0.6–1.1)
EGFR: >90 mL/min/{1.73_m2} (ref >90)
Glucose: 143 mg/dl — ABNORMAL HIGH (ref 70–140)
Potassium: 3.3 mEq/L — ABNORMAL LOW (ref 3.5–5.1)
Sodium: 141 mEq/L (ref 136–145)
Total Bilirubin: 0.55 mg/dL (ref 0.20–1.20)
Total Protein: 8 g/dL (ref 6.4–8.3)

## 2016-03-01 LAB — CBC WITH DIFFERENTIAL/PLATELET
BASO%: 0.1 % (ref 0.0–2.0)
Basophils Absolute: 0 10*3/uL (ref 0.0–0.1)
EOS%: 1.7 % (ref 0.0–7.0)
Eosinophils Absolute: 0.1 10*3/uL (ref 0.0–0.5)
HCT: 39.5 % (ref 34.8–46.6)
HGB: 13.4 g/dL (ref 11.6–15.9)
LYMPH%: 26.9 % (ref 14.0–49.7)
MCH: 30.9 pg (ref 25.1–34.0)
MCHC: 33.9 g/dL (ref 31.5–36.0)
MCV: 91 fL (ref 79.5–101.0)
MONO#: 0.5 10*3/uL (ref 0.1–0.9)
MONO%: 7.3 % (ref 0.0–14.0)
NEUT#: 4.8 10*3/uL (ref 1.5–6.5)
NEUT%: 64 % (ref 38.4–76.8)
Platelets: 197 10*3/uL (ref 145–400)
RBC: 4.34 10*6/uL (ref 3.70–5.45)
RDW: 12.4 % (ref 11.2–14.5)
WBC: 7.4 10*3/uL (ref 3.9–10.3)
lymph#: 2 10*3/uL (ref 0.9–3.3)

## 2016-03-01 NOTE — Telephone Encounter (Signed)
Appointments scheduled per 1/8 LOS. Patient given AVS report and calendars with future scheduled appointments.  °

## 2016-03-26 ENCOUNTER — Telehealth: Payer: Self-pay | Admitting: Gastroenterology

## 2016-03-26 ENCOUNTER — Encounter: Payer: Self-pay | Admitting: Internal Medicine

## 2016-03-26 ENCOUNTER — Ambulatory Visit (INDEPENDENT_AMBULATORY_CARE_PROVIDER_SITE_OTHER): Payer: Medicare Other | Admitting: Internal Medicine

## 2016-03-26 VITALS — BP 130/68 | HR 69 | Temp 97.7°F | Ht 63.0 in | Wt 256.0 lb

## 2016-03-26 DIAGNOSIS — I10 Essential (primary) hypertension: Secondary | ICD-10-CM | POA: Diagnosis not present

## 2016-03-26 DIAGNOSIS — Z800 Family history of malignant neoplasm of digestive organs: Secondary | ICD-10-CM | POA: Diagnosis not present

## 2016-03-26 DIAGNOSIS — E119 Type 2 diabetes mellitus without complications: Secondary | ICD-10-CM | POA: Diagnosis not present

## 2016-03-26 LAB — HEMOGLOBIN A1C: Hgb A1c MFr Bld: 6.6 % — ABNORMAL HIGH (ref 4.6–6.5)

## 2016-03-26 NOTE — Telephone Encounter (Signed)
Pt has been scheduled for an office visit to discuss colon.

## 2016-03-26 NOTE — Patient Instructions (Signed)
Limit your sodium (Salt) intake   Please check your hemoglobin A1c every 3-6  Months  Please check your blood pressure on a regular basis.  If it is consistently greater than 150/90, please make an office appointment.    It is important that you exercise regularly, at least 20 minutes 3 to 4 times per week.  If you develop chest pain or shortness of breath seek  medical attention.  You need to lose weight.  Consider a lower calorie diet and regular exercise.  Schedule your colonoscopy to help detect colon cancer.

## 2016-03-26 NOTE — Progress Notes (Signed)
Pre visit review using our clinic review tool, if applicable. No additional management support is needed unless otherwise documented below in the visit note. 

## 2016-03-26 NOTE — Progress Notes (Signed)
Subjective:    Patient ID: Darlene Mclaughlin, female    DOB: 1950/12/04, 66 y.o.   MRN: [id number]  HPI  66 year old patient who has essential hypertension and type 2 diabetes.  She is followed by oncology.  Due to right breast cancer.  She is noted have mild hypokalemia recently and potassium supplementation has been increased Last year she had a brother died of metastatic colon cancer She has type 2 diabetes which has been stable  Lab Results  Component Value Date   HGBA1C 6.4 09/16/2015    Past Medical History:  Diagnosis Date  . ALLERGIC RHINITIS 05/15/2008  . ASTHMA 05/15/2008  . Cancer [facility])    DCIS right breast  . Diabetes mellitus without complication ([place])   . GERD (gastroesophageal reflux disease)   . Headache(784.0) 05/15/2008  . HYPERTENSION 05/15/2008  . IMPAIRED GLUCOSE TOLERANCE 05/15/2008  . Obesity   . OSTEOARTHRITIS 05/15/2008   back     Social History   Social History  . Marital status: Widowed    Spouse name: N/A  . Number of children: N/A  . Years of education: N/A   Occupational History  . Retired    Social History Main Topics  . Smoking status: Never Smoker  . Smokeless tobacco: Never Used  . Alcohol use Yes     Comment: RARELY  . Drug use: No  . Sexual activity: Not on file   Other Topics Concern  . Not on file   Social History Narrative  . No narrative on file    Past Surgical History:  Procedure Laterality Date  . ABDOMINAL HYSTERECTOMY    . BREAST LUMPECTOMY WITH RADIOACTIVE SEED LOCALIZATION Right 12/17/2014   Procedure: RIGHT BREAST RADIOACTIVE SEED GUIDED PARTIAL MASTECTOMY;  Surgeon: Erroll Luna, MD;  Location: [place];  Service: General;  Laterality: Right;  . CESAREAN SECTION    . KNEE SURGERY     arthroscopic left    Family History  Problem Relation Age of Onset  . Diabetes Mother   . Hypertension Mother   . Cancer Mother 80    Breast cancer twice   . Hyperlipidemia Father   . Cancer Brother     pancreatic cancer   . Diabetes Brother     Allergies  Allergen Reactions  . Macrobid [Nitrofurantoin Monohyd Macro]      Liver problems  . Naproxen   . Zanaflex [Tizanidine Hcl] Other (See Comments)     Liver problems    Current Outpatient Prescriptions on File Prior to Visit  Medication Sig Dispense Refill  . acetaminophen (TYLENOL) 650 MG CR tablet Take 650 mg by mouth every 8 (eight) hours as needed.      Marland Kitchen anastrozole (ARIMIDEX) 1 MG tablet Take 1 tablet (1 mg total) by mouth daily. 90 tablet 3  . aspirin 81 MG tablet Take 81 mg by mouth daily.      . chlorthalidone (HYGROTON) 25 MG tablet TAKE 1 TABLET (25 MG TOTAL) BY MOUTH DAILY. 90 tablet 1  . EPIPEN 2-PAK 0.3 MG/0.3ML SOAJ injection 0.3 mg. Reported on 05/01/2015  1  . fluticasone (FLONASE) 50 MCG/ACT nasal spray Place 1 spray into both nostrils daily. 16 g 5  . glucose blood (FREESTYLE LITE) test strip 1 each by Other route 2 (two) times daily. Use as instructed 100 each 12  . KLOR-CON M20 20 MEQ tablet TAKE 1 TABLET DAILY. 90 tablet 2  . metFORMIN (GLUCOPHAGE-XR) 500 MG 24 hr tablet TAKE 2 TABLETS (  1,000 MG TOTAL) BY MOUTH DAILY WITH SUPPER. 180 tablet 3  . Multiple Vitamin (MULTIVITAMIN) tablet Take 1 tablet by mouth daily.      . NON FORMULARY WEEKLY ALLERGY INJECTIONS     . pantoprazole (PROTONIX) 40 MG tablet TAKE 1 TABLET (40 MG TOTAL) BY MOUTH DAILY. 90 tablet 3  . PROAIR HFA 108 (90 BASE) MCG/ACT inhaler Inhale 1 puff into the lungs every 6 (six) hours as needed.   0  . traMADol (ULTRAM) 50 MG tablet TAKE 1 TABLET BY MOUTH EVERY 6 HOURS AS NEEDED FOR PAIN 50 tablet 2   No current facility-administered medications on file prior to visit.     BP 130/68 (BP Location: Right Arm, Patient Position: Sitting, Cuff Size: Large)   Pulse 69   Temp 97.7 F (36.5 C) (Oral)   Ht 5\' 3"  (1.6 m)   Wt 256 lb (116.1 kg)   BMI 45.35 kg/m     Review of Systems  Constitutional: Negative.  Negative for appetite change, fatigue,  fever and unexpected weight change.  HENT: Negative for congestion, dental problem, ear pain, hearing loss, mouth sores, nosebleeds, rhinorrhea, sinus pressure, sore throat, tinnitus, trouble swallowing and voice change.   Eyes: Negative for photophobia, pain, discharge, redness and visual disturbance.  Respiratory: Negative for cough, chest tightness and shortness of breath.   Cardiovascular: Negative for chest pain, palpitations and leg swelling.  Gastrointestinal: Negative for abdominal distention, abdominal pain, blood in stool, constipation, diarrhea, nausea, rectal pain and vomiting.  Genitourinary: Negative for difficulty urinating, dysuria, flank pain, frequency, genital sores, hematuria, menstrual problem, pelvic pain, urgency, vaginal bleeding, vaginal discharge and vaginal pain.  Musculoskeletal: Negative for arthralgias, back pain, gait problem, joint swelling and neck stiffness.  Skin: Negative for rash.  Neurological: Negative for dizziness, syncope, speech difficulty, weakness, light-headedness, numbness and headaches.  Hematological: Negative for adenopathy. Does not bruise/bleed easily.  Psychiatric/Behavioral: Negative for agitation, behavioral problems, dysphoric mood, self-injury and suicidal ideas. The patient is not nervous/anxious.        Objective:   Physical Exam  Constitutional: She is oriented to person, place, and time. She appears well-developed and well-nourished.  Blood pressure 130/70 Weight 256  HENT:  Head: Normocephalic.  Right Ear: External ear normal.  Left Ear: External ear normal.  Mouth/Throat: Oropharynx is clear and moist.  Eyes: Conjunctivae and EOM are normal. Pupils are equal, round, and reactive to light.  Neck: Normal range of motion. Neck supple. No thyromegaly present.  Cardiovascular: Normal rate, regular rhythm, normal heart sounds and intact distal pulses.   Pulmonary/Chest: Effort normal and breath sounds normal.  Abdominal: Soft.  Bowel sounds are normal. She exhibits no mass. There is no tenderness.  Musculoskeletal: Normal range of motion.  Lymphadenopathy:    She has no cervical adenopathy.  Neurological: She is alert and oriented to person, place, and time.  Skin: Skin is warm and dry. No rash noted.  Psychiatric: She has a normal mood and affect. Her behavior is normal.          Assessment & Plan:   Essential hypertension, stable Type 2 diabetes.  Check hemoglobin A1c Family history of colon cancer.  Will schedule follow-up colonoscopy  Follow-up 6 months Oncology follow-up as scheduled  Nyoka Cowden

## 2016-04-01 ENCOUNTER — Other Ambulatory Visit: Payer: Self-pay | Admitting: Internal Medicine

## 2016-05-03 ENCOUNTER — Telehealth: Payer: Self-pay

## 2016-05-03 NOTE — Telephone Encounter (Signed)
Left message on machine to call back need to reschedule pt for tomorrow due to weather not opening until 10 am

## 2016-05-03 NOTE — Telephone Encounter (Signed)
Pt has been rescheduled. 

## 2016-05-04 ENCOUNTER — Ambulatory Visit: Payer: Medicare Other | Admitting: Gastroenterology

## 2016-05-13 ENCOUNTER — Other Ambulatory Visit: Payer: Self-pay | Admitting: Internal Medicine

## 2016-05-27 ENCOUNTER — Telehealth: Payer: Self-pay | Admitting: Internal Medicine

## 2016-05-27 NOTE — Telephone Encounter (Signed)
° ° °  Pt request refill of the following:  KLOR-CON M20 20 MEQ tablet  Pt said her onocologist told her to take 2 tablets daily and now she need a new RX stating to take 2 pills a day     Phamacy: [facility]

## 2016-05-27 NOTE — Telephone Encounter (Signed)
Ok to change pt's Rx? Please advise

## 2016-05-31 NOTE — Telephone Encounter (Signed)
Yes.  Okay to change prescription for 2 tablets daily

## 2016-06-01 MED ORDER — POTASSIUM CHLORIDE CRYS ER 20 MEQ PO TBCR: 20.0000 meq | EXTENDED_RELEASE_TABLET | Freq: Two times a day (BID) | ORAL | 2 refills | Status: DC

## 2016-06-01 NOTE — Telephone Encounter (Signed)
Medication refilled

## 2016-06-08 ENCOUNTER — Other Ambulatory Visit: Payer: Self-pay | Admitting: Internal Medicine

## 2016-06-18 ENCOUNTER — Encounter (INDEPENDENT_AMBULATORY_CARE_PROVIDER_SITE_OTHER): Payer: Self-pay

## 2016-06-18 ENCOUNTER — Encounter: Payer: Self-pay | Admitting: Gastroenterology

## 2016-06-18 ENCOUNTER — Ambulatory Visit (INDEPENDENT_AMBULATORY_CARE_PROVIDER_SITE_OTHER): Payer: Medicare Other | Admitting: Gastroenterology

## 2016-06-18 VITALS — BP 114/76 | HR 82 | Ht 64.5 in | Wt 255.0 lb

## 2016-06-18 DIAGNOSIS — R194 Change in bowel habit: Secondary | ICD-10-CM

## 2016-06-18 DIAGNOSIS — Z1211 Encounter for screening for malignant neoplasm of colon: Secondary | ICD-10-CM

## 2016-06-18 DIAGNOSIS — Z800 Family history of malignant neoplasm of digestive organs: Secondary | ICD-10-CM | POA: Diagnosis not present

## 2016-06-18 MED ORDER — NA SULFATE-K SULFATE-MG SULF 17.5-3.13-1.6 GM/177ML PO SOLN: 1.0000 | Freq: Once | ORAL | 0 refills | Status: AC

## 2016-06-18 NOTE — Patient Instructions (Addendum)
You will be set up for a colonoscopy for colon cancer screening.  Stay on once daily fiber, stay hydrated.

## 2016-06-18 NOTE — Progress Notes (Signed)
HPI: This is a very pleasant 66 year old woman whom I last saw a few years ago. She is here today for a new problem.    Chief complaint is constipation, change in bowels  Her brother had colon cancer.  Since increasing her calcium she's been a bit more constipated.  Has been taking metamucil for a while and that has helped.  No overt bleeding.  Has hemorroids occasionally.  No serious abd pains.  Overall weight has been up and down.  Brother recently died of colon cancer at age 85. Autopsy confirmed the diagnosis.  No fevers or chills  She had a colonoscopy 10 years ago with Dr. Collene Mares. We do not have any of those results available.  ROS: complete GI ROS as described in HPI.  Constitutional:  No unintentional weight loss   Past Medical History:  Diagnosis Date  . ALLERGIC RHINITIS 05/15/2008  . ASTHMA 05/15/2008  . Cancer [facility])    DCIS right breast  . Diabetes mellitus without complication ([place])   . GERD (gastroesophageal reflux disease)   . Headache(784.0) 05/15/2008  . HYPERTENSION 05/15/2008  . IMPAIRED GLUCOSE TOLERANCE 05/15/2008  . Obesity   . OSTEOARTHRITIS 05/15/2008   back    Past Surgical History:  Procedure Laterality Date  . ABDOMINAL HYSTERECTOMY    . BREAST LUMPECTOMY WITH RADIOACTIVE SEED LOCALIZATION Right 12/17/2014   Procedure: RIGHT BREAST RADIOACTIVE SEED GUIDED PARTIAL MASTECTOMY;  Surgeon: Erroll Luna, MD;  Location: [facility];  Service: General;  Laterality: Right;  . CESAREAN SECTION    . KNEE SURGERY     arthroscopic left    Current Outpatient Prescriptions  Medication Sig Dispense Refill  . acetaminophen (TYLENOL) 650 MG CR tablet Take 650 mg by mouth every 8 (eight) hours as needed.      Marland Kitchen anastrozole (ARIMIDEX) 1 MG tablet Take 1 tablet (1 mg total) by mouth daily. 90 tablet 3  . aspirin 81 MG tablet Take 81 mg by mouth daily.      Marland Kitchen CALCIUM PO Take 1,000 mg by mouth daily.    . chlorthalidone (HYGROTON) 25 MG tablet  TAKE 1 TABLET (25 MG TOTAL) BY MOUTH DAILY. 90 tablet 1  . EPIPEN 2-PAK 0.3 MG/0.3ML SOAJ injection 0.3 mg. Reported on 05/01/2015  1  . fluticasone (FLONASE) 50 MCG/ACT nasal spray Place 1 spray into both nostrils daily. 16 g 5  . glucose blood (FREESTYLE LITE) test strip 1 each by Other route 2 (two) times daily. Use as instructed 100 each 12  . metFORMIN (GLUCOPHAGE-XR) 500 MG 24 hr tablet TAKE 2 TABLETS (1,000 MG TOTAL) BY MOUTH DAILY WITH SUPPER. 180 tablet 3  . Multiple Vitamin (MULTIVITAMIN) tablet Take 1 tablet by mouth daily.      . NON FORMULARY WEEKLY ALLERGY INJECTIONS     . pantoprazole (PROTONIX) 40 MG tablet TAKE 1 TABLET (40 MG TOTAL) BY MOUTH DAILY. 90 tablet 3  . potassium chloride SA (KLOR-CON M20) 20 MEQ tablet Take 1 tablet (20 mEq total) by mouth 2 (two) times daily. 90 tablet 2  . PROAIR HFA 108 (90 BASE) MCG/ACT inhaler Inhale 1 puff into the lungs every 6 (six) hours as needed.   0  . traMADol (ULTRAM) 50 MG tablet TAKE 1 TABLET BY MOUTH EVERY 6 HOURS AS NEEDED FOR PAIN 50 tablet 2   No current facility-administered medications for this visit.     Allergies as of 06/18/2016 - Review Complete 06/18/2016  Allergen Reaction Noted  . Macrobid [nitrofurantoin  monohyd macro]  07/26/2014  . Naproxen    . Zanaflex [tizanidine hcl] Other (See Comments) 07/26/2014    Family History  Problem Relation Age of Onset  . Diabetes Mother   . Hypertension Mother   . Breast cancer Mother 42    Breast cancer twice   . Hyperlipidemia Father   . Colon cancer Brother     4 th brother  . Diabetes Brother   . Pancreatic cancer Brother     middle brother  . Stomach cancer Neg Hx     Social History   Social History  . Marital status: Widowed    Spouse name: N/A  . Number of children: N/A  . Years of education: N/A   Occupational History  . Retired    Social History Main Topics  . Smoking status: Never Smoker  . Smokeless tobacco: Never Used  . Alcohol use Yes      Comment: RARELY  . Drug use: No  . Sexual activity: Not on file   Other Topics Concern  . Not on file   Social History Narrative  . No narrative on file     Physical Exam: Ht 5' 4.5" (1.638 m)   Wt 255 lb (115.7 kg)   BMI 43.09 kg/m  Constitutional: generally well-appearing Psychiatric: alert and oriented x3 Abdomen: soft, nontender, nondistended, no obvious ascites, no peritoneal signs, normal bowel sounds No peripheral edema noted in lower extremities  Assessment and plan: 66 y.o. female with change in bowel habits, mild constipation, family history of colon cancer  First her mild constipation is likely med related or functional. I recommended she stay on fiber supplements and increase her water consumption. She has not had colon cancer screening in probably 10 years. Her brother was just recently diagnosed with colon cancer after what sounds like an acute illness. The diagnosis was confirmed on autopsy since he passed from his acute illness. I recommended colonoscopy for high risk colon cancer screening. I see no reason for any further blood tests or imaging studies prior to then.  Please see the "Patient Instructions" section for addition details about the plan.  Owens Loffler, MD [facility] 06/18/2016, 10:07 AM

## 2016-06-20 ENCOUNTER — Other Ambulatory Visit: Payer: Self-pay | Admitting: Internal Medicine

## 2016-07-21 ENCOUNTER — Encounter: Payer: Self-pay | Admitting: Gastroenterology

## 2016-07-26 ENCOUNTER — Encounter: Payer: Medicare Other | Admitting: Gastroenterology

## 2016-08-04 ENCOUNTER — Ambulatory Visit (AMBULATORY_SURGERY_CENTER): Payer: Medicare Other | Admitting: Gastroenterology

## 2016-08-04 ENCOUNTER — Encounter: Payer: Self-pay | Admitting: Gastroenterology

## 2016-08-04 VITALS — BP 170/82 | HR 68 | Temp 96.6°F | Resp 15 | Ht 64.5 in | Wt 255.0 lb

## 2016-08-04 DIAGNOSIS — Z800 Family history of malignant neoplasm of digestive organs: Secondary | ICD-10-CM

## 2016-08-04 DIAGNOSIS — Z1212 Encounter for screening for malignant neoplasm of rectum: Secondary | ICD-10-CM

## 2016-08-04 DIAGNOSIS — D120 Benign neoplasm of cecum: Secondary | ICD-10-CM

## 2016-08-04 DIAGNOSIS — Z1211 Encounter for screening for malignant neoplasm of colon: Secondary | ICD-10-CM | POA: Diagnosis not present

## 2016-08-04 MED ORDER — SODIUM CHLORIDE 0.9 % IV SOLN: 500.0000 mL | INTRAVENOUS | Status: DC

## 2016-08-04 NOTE — Progress Notes (Signed)
Called to room to assist during endoscopic procedure.  Patient ID and intended procedure confirmed with present staff. Received instructions for my participation in the procedure from the performing physician.  

## 2016-08-04 NOTE — Op Note (Signed)
[facility] Patient Name: Darlene Mclaughlin Procedure Date: 08/04/2016 10:07 AM MRN: [id number] Endoscopist: Milus Banister , MD Age: 66 Referring MD:  Date of Birth: May 03, 1950 Gender: Female Account #: [id number] Procedure:                Colonoscopy Indications:              Screening in patient at increased risk: Family                            history of 1st-degree relative with colorectal                            cancer (Brother) Medicines:                Monitored Anesthesia Care Procedure:                Pre-Anesthesia Assessment:                           - Prior to the procedure, a History and Physical                            was performed, and patient medications and                            allergies were reviewed. The patient's tolerance of                            previous anesthesia was also reviewed. The risks                            and benefits of the procedure and the sedation                            options and risks were discussed with the patient.                            All questions were answered, and informed consent                            was obtained. Prior Anticoagulants: The patient has                            taken no previous anticoagulant or antiplatelet                            agents. ASA Grade Assessment: II - A patient with                            mild systemic disease. After reviewing the risks                            and benefits, the patient was deemed in  satisfactory condition to undergo the procedure.                           After obtaining informed consent, the colonoscope                            was passed under direct vision. Throughout the                            procedure, the patient's blood pressure, pulse, and                            oxygen saturations were monitored continuously. The                            Colonoscope was introduced through the anus and                             advanced to the the cecum, identified by                            appendiceal orifice and ileocecal valve. The                            colonoscopy was performed without difficulty. The                            patient tolerated the procedure well. The quality                            of the bowel preparation was good. The ileocecal                            valve, appendiceal orifice, and rectum were                            photographed. Scope In: 10:10:55 AM Scope Out: 10:20:57 AM Scope Withdrawal Time: 0 hours 5 minutes 41 seconds  Total Procedure Duration: 0 hours 10 minutes 2 seconds  Findings:                 A 2 mm polyp was found in the cecum. The polyp was                            sessile. The polyp was removed with a cold snare.                            Resection and retrieval were complete.                           The exam was otherwise without abnormality on                            direct and retroflexion views. Complications:  No immediate complications. Estimated blood loss:                            None. Estimated Blood Loss:     Estimated blood loss: none. Impression:               - One 2 mm polyp in the cecum, removed with a cold                            snare. Resected and retrieved.                           - The examination was otherwise normal on direct                            and retroflexion views. Recommendation:           - Patient has a contact number available for                            emergencies. The signs and symptoms of potential                            delayed complications were discussed with the                            patient. Return to normal activities tomorrow.                            Written discharge instructions were provided to the                            patient.                           - Resume previous diet.                           - Continue present  medications.                           You will receive a letter within 2-3 weeks with the                            pathology results and my final recommendations.                           If the polyp(s) is proven to be 'pre-cancerous' on                            pathology, you will need repeat colonoscopy in 5                            years. Milus Banister, MD 08/04/2016 10:27:32 AM This report has been signed electronically.

## 2016-08-04 NOTE — Progress Notes (Signed)
To recovery, report to Tyrell, RN, VSS. 

## 2016-08-04 NOTE — Patient Instructions (Signed)
Handout given ; Polyps  YOU HAD AN ENDOSCOPIC PROCEDURE TODAY AT THE [facility]:   Refer to the procedure report that was given to you for any specific questions about what was found during the examination.  If the procedure report does not answer your questions, please call your gastroenterologist to clarify.  If you requested that your care partner not be given the details of your procedure findings, then the procedure report has been included in a sealed envelope for you to review at your convenience later.  YOU SHOULD EXPECT: Some feelings of bloating in the abdomen. Passage of more gas than usual.  Walking can help get rid of the air that was put into your GI tract during the procedure and reduce the bloating. If you had a lower endoscopy (such as a colonoscopy or flexible sigmoidoscopy) you may notice spotting of blood in your stool or on the toilet paper. If you underwent a bowel prep for your procedure, you may not have a normal bowel movement for a few days.  Please Note:  You might notice some irritation and congestion in your nose or some drainage.  This is from the oxygen used during your procedure.  There is no need for concern and it should clear up in a day or so.  SYMPTOMS TO REPORT IMMEDIATELY:   Following lower endoscopy (colonoscopy or flexible sigmoidoscopy):  Excessive amounts of blood in the stool  Significant tenderness or worsening of abdominal pains  Swelling of the abdomen that is new, acute  Fever of 100F or higher  For urgent or emergent issues, a gastroenterologist can be reached at any hour by calling [phone].   DIET:  We do recommend a small meal at first, but then you may proceed to your regular diet.  Drink plenty of fluids but you should avoid alcoholic beverages for 24 hours.  ACTIVITY:  You should plan to take it easy for the rest of today and you should NOT DRIVE or use heavy machinery until tomorrow (because of the sedation medicines  used during the test).    FOLLOW UP: Our staff will call the number listed on your records the next business day following your procedure to check on you and address any questions or concerns that you may have regarding the information given to you following your procedure. If we do not reach you, we will leave a message.  However, if you are feeling well and you are not experiencing any problems, there is no need to return our call.  We will assume that you have returned to your regular daily activities without incident.  If any biopsies were taken you will be contacted by phone or by letter within the next 1-3 weeks.  Please call us at [phone] if you have not heard about the biopsies in 3 weeks.    SIGNATURES/CONFIDENTIALITY: You and/or your care partner have signed paperwork which will be entered into your electronic medical record.  These signatures attest to the fact that that the information above on your After Visit Summary has been reviewed and is understood.  Full responsibility of the confidentiality of this discharge information lies with you and/or your care-partner. 

## 2016-08-05 ENCOUNTER — Telehealth: Payer: Self-pay | Admitting: *Deleted

## 2016-08-05 NOTE — Telephone Encounter (Signed)
  Follow up Call-  Call back number 08/04/2016  Post procedure Call Back phone  # [phone]  Permission to leave phone message Yes  Some recent data might be hidden     Patient questions:  Message left to call us if necessary.

## 2016-08-05 NOTE — Telephone Encounter (Signed)
  Follow up Call-  Call back number 08/04/2016  Post procedure Call Back phone  # [phone]  Permission to leave phone message Yes  Some recent data might be hidden     Patient questions:   Message left to call us if necessary. Second call.

## 2016-08-10 ENCOUNTER — Encounter: Payer: Self-pay | Admitting: Gastroenterology

## 2016-08-26 NOTE — Progress Notes (Signed)
[place] FOLLOW UP NOTE  Patient Care Team: Marletta Lor, MD as PCP - Rosana Hoes, MD as Consulting Physician (General Surgery) Arloa Koh, MD as Consulting Physician (Radiation Oncology) Truitt Merle, MD as Consulting Physician (Hematology) Sylvan Cheese, NP as Nurse Practitioner (Hematology and Oncology)  CHIEF COMPLAINTS:  Follow up right breast DCIS  . 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      11/15/2014 Receptors her2    ER+ (100%); PR+ (90%)      11/15/2014 Clinical Stage    Stage 0: Tis N0      12/17/2014 Surgery    Right breast lumpectomy.      12/17/2014 Pathology Results    Right breast lumpectomy showed usual ductal Hyperplasia and fibroadenoma. No residual ductal carcinoma in situ.        Radiation Therapy    Not recommended due to lack of residual malignancy at time of definitive surgery      03/28/2015 -  Anti-estrogen oral therapy    Anastrozole 1 mg daily begun 03/28/2015      05/01/2015 Survivorship    Survivorship visit completed and copy of care plan provided to patient      11/06/2015 Imaging    MM DIAG BREAST TOMO BILATERAL 11/06/15 IMPRESSION: No mammographic evidence of malignancy. BI-RADS CATEGORY  2: Benign.      08/04/2016 Pathology Results    Diagnosis  Surgical [P], cecum, polyp - TUBULAR ADENOMA. - NO HIGH GRADE DYSPLASIA OR MALIGNANCY.      08/04/2016 Procedure   Colonoscopy A 2 mm polyp was found in the cecum. The polyp was sessile. The polyp was removed with a cold snare. Resection and retrieval were complete. Findings: - The exam was otherwise without abnormality on direct and retroflexion views.       HISTORY OF PRESENTING ILLNESS:  Darlene Mclaughlin 66 y.o. female is here because of recent diagnosis of right breast DCIS.   The cancer was detected by screening mammogram. She does mammogram every year. The cancer was not palpable prior to diagnosis. She denies any other new symptoms.  She has chronic back pain, and had epidural injection twice which helped a lot. She also has chronic arthritis, especially knees and feet, she takes tramadol for both back and knee pain, she takes 0-1tab/day.  She denies any other symptoms. She has good appetite and eats well. She has good energy level. She is widowed, lives alone and very independent. She has 2 sons and 3 grandchildren, and she sees him often. In terms of breast cancer risk profile:  She menarched at early age of 86 and went to menopause at age 22 (hysrectomy)  She had 2 pregnancy and 2 children, her first child was born at age 66 She did not breast-fed her child.  She received birth control pills for approximately 5 years  She was never exposed to fertility medications or  hormone replacement therapy.  She has family history of Breast cancer (mother)   CURRENT THERAPY: anastrozole 36m daily started on 03/28/2015  INTERIM HISTORY: Ms  CSanereturns for follow-up. She has been doing well. She denies any new troubles within the last 6 months. She has a skin tag under her arm and wants to get it looked at before she pops it. She is having no problems with Anastrozole. She still has her normal leg and back pains which she had before starting anastrozole. Some days it hinders her daily activity. She is still taking her calcium and vitamin d pills. She does have some sleepless nights    MEDICAL  HISTORY:  Past Medical History:  Diagnosis Date  . ALLERGIC RHINITIS 05/15/2008  . Anemia   . ASTHMA 05/15/2008  . Blood transfusion without reported diagnosis 1991   anemia  . Cancer ([facility]    DCIS right breast  . Diabetes [place]   . GERD (gastroesophageal reflux disease)   . Headache(784.0) 05/15/2008  . HYPERTENSION 05/15/2008  . IMPAIRED GLUCOSE TOLERANCE 05/15/2008  . Obesity   . OSTEOARTHRITIS 05/15/2008   back    SURGICAL HISTORY: Past Surgical History:  Procedure Laterality Date  . ABDOMINAL HYSTERECTOMY    . BREAST LUMPECTOMY WITH RADIOACTIVE SEED LOCALIZATION Right 12/17/2014   Procedure: RIGHT BREAST RADIOACTIVE SEED GUIDED PARTIAL MASTECTOMY;  Surgeon: TErroll Luna MD;  [place] [facility]  Service: General;  Laterality: Right;  . CESAREAN SECTION    . KNEE SURGERY     arthroscopic left    SOCIAL HISTORY: Social History   Social History  . Marital status: Widowed    Spouse name: N/A  . Number of children: N/A  . Years of education: N/A   Occupational History  . Retired    Social History Main Topics  . Smoking status: Never Smoker  . Smokeless tobacco: Never Used  . Alcohol use Yes     Comment: RARELY  . Drug use: No  . Sexual activity: Not on file   Other Topics Concern  . Not on file   Social History Narrative  . No narrative on file    FAMILY HISTORY: Family History  Problem Relation Age of Onset  . Diabetes Mother   . Hypertension Mother   . Breast cancer Mother 727      Breast cancer twice   . Hyperlipidemia Father   . Colon cancer Brother        4 th brother  . Diabetes Brother   . Pancreatic cancer Brother        middle brother  . Stomach cancer Neg Hx     ALLERGIES:  is allergic to macrobid [nitrofurantoin monohyd macro]; naproxen; and zanaflex [tizanidine hcl].  MEDICATIONS:  Current Outpatient Prescriptions  Medication Sig Dispense Refill  . acetaminophen (TYLENOL) 650 MG CR  tablet Take 650 mg by mouth every 8 (eight) hours as needed.      .Marland Kitchenanastrozole (ARIMIDEX) 1 MG tablet Take 1 tablet (1 mg total) by mouth daily. 90 tablet 3  . aspirin 81 MG tablet Take 81 mg by mouth daily.      .Marland KitchenCALCIUM PO Take 1,000 mg by mouth daily.    . chlorthalidone (HYGROTON) 25 MG tablet TAKE 1 TABLET (25 MG TOTAL) BY MOUTH DAILY. 90 tablet 1  . fluticasone (FLONASE) 50 MCG/ACT nasal spray Place 1 spray into both nostrils daily. 16 g 5  . glucose blood (FREESTYLE LITE) test  strip 1 each by Other route 2 (two) times daily. Use as instructed 100 each 12  . metFORMIN (GLUCOPHAGE-XR) 500 MG 24 hr tablet TAKE 2 TABLETS (1,000 MG TOTAL) BY MOUTH DAILY WITH SUPPER. 180 tablet 3  . Multiple Vitamin (MULTIVITAMIN) tablet Take 1 tablet by mouth daily.      . NON FORMULARY WEEKLY ALLERGY INJECTIONS     . pantoprazole (PROTONIX) 40 MG tablet TAKE 1 TABLET (40 MG TOTAL) BY MOUTH DAILY. 90 tablet 3  . potassium chloride SA (KLOR-CON M20) 20 MEQ tablet Take 1 tablet (20 mEq total) by mouth 2 (two) times daily. 90 tablet 2  . PROAIR HFA 108 (90 BASE) MCG/ACT inhaler Inhale 1 puff into the lungs every 6 (six) hours as needed.   0  . traMADol (ULTRAM) 50 MG tablet TAKE 1 TABLET BY MOUTH EVERY 6 HOURS AS NEEDED FOR PAIN 50 tablet 2  . EPIPEN 2-PAK 0.3 MG/0.3ML SOAJ injection 0.3 mg. Reported on 05/01/2015  1   Current Facility-Administered Medications  Medication Dose Route Frequency Provider Last Rate Last Dose  . 0.9 %  sodium chloride infusion  500 mL Intravenous Continuous Milus Banister, MD        REVIEW OF SYSTEMS:   Constitutional: Denies fevers, chills or abnormal night sweats (+)insominia  Eyes: Denies blurriness of vision, double vision or watery eyes Ears, nose, mouth, throat, and face: Denies mucositis or sore throat Respiratory: Denies cough, dyspnea or wheezes Cardiovascular: Denies palpitation, chest discomfort or lower extremity swelling Gastrointestinal:  Denies nausea, heartburn  or change in bowel habits Skin: Denies abnormal skin rashes Lymphatics: Denies new lymphadenopathy or easy bruising Neurological:Denies numbness, tingling or new weaknesses Behavioral/Psych: Mood is stable, no new changes  All other systems were reviewed with the patient and are negative.  PHYSICAL EXAMINATION:  ECOG PERFORMANCE STATUS: 1 - Symptomatic but completely ambulatory  Vitals:   08/30/16 0942  BP: (!) 141/73  Pulse: 72  Resp: 18  Temp: 98.5 F (36.9 C)   Filed Weights   08/30/16 0942  Weight: 253 lb (114.8 kg)    GENERAL:alert, no distress and comfortable SKIN: skin color, texture, turgor are normal, no rashes or significant lesions EYES: normal, conjunctiva are pink and non-injected, sclera clear OROPHARYNX:no exudate, no erythema and lips, buccal mucosa, and tongue normal  NECK: supple, thyroid normal size, non-tender, without nodularity LYMPH:  no palpable lymphadenopathy in the cervical, axillary or inguinal LUNGS: clear to auscultation and percussion with normal breathing effort HEART: regular rate & rhythm and no murmurs and no lower extremity edema ABDOMEN:abdomen soft, non-tender and normal bowel sounds Musculoskeletal:no cyanosis of digits and no clubbing  PSYCH: alert & oriented x 3 with fluent speech NEURO: no focal motor/sensory deficits Breasts: Breast inspection showed them to be symmetrical with no nipple discharge. Right breast incision site is clean and has healed well.  Palpation of the left breast and axilla revealed no obvious mass that I could appreciate.   LABORATORY DATA:  I have reviewed the data as listed CBC Latest Ref Rng & Units 08/30/2016 03/01/2016 09/16/2015  WBC 3.9 - 10.3 10e3/uL 7.7 7.4 7.5  Hemoglobin 11.6 - 15.9 g/dL 12.9 13.4 13.1  Hematocrit 34.8 - 46.6 % 38.9 39.5 38.9  Platelets 145 - 400 10e3/uL 187 197 181.0   CMP Latest Ref Rng & Units 08/30/2016 03/01/2016 09/16/2015  Glucose 70 - 140 mg/dl 118 143(H) 132(H)  BUN 7.0 - 26.0  mg/dL 16.1 10.3 10  Creatinine 0.6 - 1.1 mg/dL  0.7 0.7 0.66  Sodium 136 - 145 mEq/L 140 141 139  Potassium 3.5 - 5.1 mEq/L 3.4(L) 3.3(L) 3.5  Chloride 96 - 112 mEq/L - - 99  CO2 22 - 29 mEq/L _0 Calcium 8.4 - 10.4 mg/dL 10.0 10.1 9.9  Total Protein 6.4 - 8.3 g/dL 7.9 8.0 7.7  Total Bilirubin 0.20 - 1.20 mg/dL 0.35 0.55 0.6  Alkaline Phos 40 - 150 U/L 82 84 69  AST 5 - 34 U/L _1 ALT 0 - 55 U/L _2 PATHOLOGY REPORT Diagnosis 08/04/2016 Surgical [P], cecum, polyp - TUBULAR ADENOMA. - NO HIGH GRADE DYSPLASIA OR MALIGNANCY.  Diagnosis 11/15/2014 Breast, right, needle core biopsy, lower outer - DUCTAL CARCINOMA IN SITU WITH CALCIFICATIONS. - FIBROADENOMA. - SEE COMMENT. Results: IMMUNOHISTOCHEMICAL AND MORPHOMETRIC ANALYSIS PERFORMED MANUALLY Estrogen Receptor: 100%, POSITIVE, STRONG STAINING INTENSITY Progesterone Receptor: 90%, POSITIVE, STRONG STAINING INTENSITY  Diagnosis 12/17/2014 Breast, lumpectomy, Right USUAL DUCTAL HYPERPLASIA AND FIBROADENOMA BIOPSY SITE CHANGES NO RESIDUAL DUCTAL CARCINOMA IN SITU IS IDENTIFIED  PROCEDURES  08/04/2016 colonoscopy A 2 mm polyp was found in the cecum. The polyp was sessile. The polyp was removed with a cold snare. Resection and retrieval were complete. Findings: - The exam was otherwise without abnormality on direct and retroflexion views.   RADIOGRAPHIC STUDIES: I have personally reviewed the radiological images as listed and agreed with the findings in the report.  MM DIAG BREAST TOMO BILATERAL 11/06/15 IMPRESSION: No mammographic evidence of malignancy. BI-RADS CATEGORY  2: Benign.  Mm Digital Diagnostic Unilat R 11/12/2014      IMPRESSION: Grouped punctate and amorphous calcifications confirmed within the lower outer quadrant of the right breast, 6-7 o'clock axis region, for which stereotactic biopsy is recommended.  RECOMMENDATION: Stereotactic biopsy, possibly with tomosynthesis guidance, for the  right breast calcifications.  Stereotactic biopsy is scheduled for September 23rd.  I have discussed the findings and recommendations with the patient. Results were also provided in writing at the conclusion of the visit. If applicable, a reminder letter will be sent to the patient regarding the next appointment.  BI-RADS CATEGORY  4: Suspicious.   Electronically Signed   By: Franki Cabot M.D.   On: 11/12/2014 12:09   Mm Digital Screening Bilateral 11/06/2014   ACR Breast Density Category b: There are scattered areas of fibroglandular density.  FINDINGS: In the right breast, calcifications warrant further evaluation with magnified views. In the left breast, no findings suspicious for malignancy. Images were processed with CAD.  IMPRESSION: Further evaluation is suggested for calcifications in the right breast.  RECOMMENDATION: Diagnostic mammogram of the right breast. (Code:[id number])  The patient will be contacted regarding the findings, and additional imaging will be scheduled.  BI-RADS CATEGORY  0: Incomplete. Need additional imaging evaluation and/or prior mammograms for comparison.   Electronically Signed   By: Nolon Nations M.D.   On: 11/06/2014 10:27     ASSESSMENT: 66 y.o. African-American female, with past medical history of hypertension, arthritis, chronic back pain, obesity, and the well-controlled asthma, who was found to have right breast DCIS by screening mammogram.  PLAN:  #1 right breast DCIS, ER/PR strongly positive -I reviewed her surgical pathology results, which showed no residual malignant cells.The patient had early stage disease. She  Is cured.  -Any form of adjuvant treatment is for preventive  -She is on anastrozole for chemoprevention, plan for total 5 years. She is tolerating well, we'll continue. -Continue breast cancer surveillance with  annual screening mammogram, self exam and routine follow-up. -I reviewed her lab results, CBC and a CMP are within normal limits. She is  clinically doing well, no evidence of recurrence. -I encouraged her to eat healthy and accessed regularly. She is not a very active due to the back pain and arthritis, I also encouraged her to consider weight loss, she is motivated. -continue screening mammogram once a year, she will be due in September, I will order next visit - bone density scan due in February 2019, I will order next [place] reviewed, she is clinically doing well. - follow up in 6 months with labs  #2 Osteopenia  -Repeated a bone density scan in February 2017 showed osteopenia, no high risk for fracture. I reviewed with her  -She is on multiple vitamin which contains vitamin D thousand unit -I encouraged her at calcium 600 mg once daily -I encouraged her to exercise regularly, she does water exercise   #3. Hypertension, diabetes, arthritis -up with her primary care physician  #4 hypokalemia -Secondary to chlorthalidone  -She'll continue potassium 1 tablet day, and try to eat potassium-rich foods such as bananas. -The patient's potassium on 03/01/16 was 3.3. The patient states she is taking 1 tablet a day, I advised her to now take 2 a day for the next 5 days then continue once daily   #5 Obesity  -Encouraged exercise and diet.  #6 Insomnia - she would like to try Melatonin  Plan -Continue anastrozole 1 mg once daily -she will try Melatonin for insomnia  - mammogram in september -I'll see her 6 months with lab, will order DEXA on next visit    l questions were answered. The patient knows to call the clinic with any problems, questions or concerns. I spent 20 minutes counseling the patient face to face. The total time spent in the appointment was 25 minutes and more than 50% was on counseling.     Truitt Merle, MD 08/30/2016   This document serves as a record of services personally performed by Truitt Merle, MD. It was created on her behalf by Brandt Loosen, a trained medical scribe. The creation of this record is  based on the scribe's personal observations and the provider's statements to them. This document has been checked and approved by the attending provider.

## 2016-08-30 ENCOUNTER — Telehealth: Payer: Self-pay | Admitting: Hematology

## 2016-08-30 ENCOUNTER — Ambulatory Visit (HOSPITAL_BASED_OUTPATIENT_CLINIC_OR_DEPARTMENT_OTHER): Payer: Medicare Other | Admitting: Hematology

## 2016-08-30 ENCOUNTER — Encounter: Payer: Self-pay | Admitting: Hematology

## 2016-08-30 ENCOUNTER — Other Ambulatory Visit (HOSPITAL_BASED_OUTPATIENT_CLINIC_OR_DEPARTMENT_OTHER): Payer: Medicare Other

## 2016-08-30 VITALS — BP 141/73 | HR 72 | Temp 98.5°F | Resp 18 | Ht 64.5 in | Wt 253.0 lb

## 2016-08-30 DIAGNOSIS — Z79811 Long term (current) use of aromatase inhibitors: Secondary | ICD-10-CM | POA: Diagnosis not present

## 2016-08-30 DIAGNOSIS — D0511 Intraductal carcinoma in situ of right breast: Secondary | ICD-10-CM

## 2016-08-30 DIAGNOSIS — M8580 Other specified disorders of bone density and structure, unspecified site: Secondary | ICD-10-CM

## 2016-08-30 DIAGNOSIS — E119 Type 2 diabetes mellitus without complications: Secondary | ICD-10-CM

## 2016-08-30 DIAGNOSIS — E669 Obesity, unspecified: Secondary | ICD-10-CM | POA: Diagnosis not present

## 2016-08-30 DIAGNOSIS — Z170 Estrogen receptor positive status [ER+]: Secondary | ICD-10-CM | POA: Diagnosis not present

## 2016-08-30 DIAGNOSIS — I10 Essential (primary) hypertension: Secondary | ICD-10-CM | POA: Diagnosis not present

## 2016-08-30 DIAGNOSIS — E876 Hypokalemia: Secondary | ICD-10-CM

## 2016-08-30 LAB — CBC WITH DIFFERENTIAL/PLATELET
BASO%: 0.3 % (ref 0.0–2.0)
Basophils Absolute: 0 10*3/uL (ref 0.0–0.1)
EOS%: 2.3 % (ref 0.0–7.0)
Eosinophils Absolute: 0.2 10*3/uL (ref 0.0–0.5)
HCT: 38.9 % (ref 34.8–46.6)
HGB: 12.9 g/dL (ref 11.6–15.9)
LYMPH%: 24.7 % (ref 14.0–49.7)
MCH: 30.8 pg (ref 25.1–34.0)
MCHC: 33.2 g/dL (ref 31.5–36.0)
MCV: 92.8 fL (ref 79.5–101.0)
MONO#: 0.6 10*3/uL (ref 0.1–0.9)
MONO%: 8.2 % (ref 0.0–14.0)
NEUT#: 4.9 10*3/uL (ref 1.5–6.5)
NEUT%: 64.5 % (ref 38.4–76.8)
Platelets: 187 10*3/uL (ref 145–400)
RBC: 4.19 10*6/uL (ref 3.70–5.45)
RDW: 12.9 % (ref 11.2–14.5)
WBC: 7.7 10*3/uL (ref 3.9–10.3)
lymph#: 1.9 10*3/uL (ref 0.9–3.3)

## 2016-08-30 LAB — COMPREHENSIVE METABOLIC PANEL
ALT: 15 U/L (ref 0–55)
AST: 17 U/L (ref 5–34)
Albumin: 3.8 g/dL (ref 3.5–5.0)
Alkaline Phosphatase: 82 U/L (ref 40–150)
Anion Gap: 9 mEq/L (ref 3–11)
BUN: 16.1 mg/dL (ref 7.0–26.0)
CO2: 29 mEq/L (ref 22–29)
Calcium: 10 mg/dL (ref 8.4–10.4)
Chloride: 101 mEq/L (ref 98–109)
Creatinine: 0.7 mg/dL (ref 0.6–1.1)
EGFR: >90 mL/min/{1.73_m2} (ref >90)
Glucose: 118 mg/dl (ref 70–140)
Potassium: 3.4 mEq/L — ABNORMAL LOW (ref 3.5–5.1)
Sodium: 140 mEq/L (ref 136–145)
Total Bilirubin: 0.35 mg/dL (ref 0.20–1.20)
Total Protein: 7.9 g/dL (ref 6.4–8.3)

## 2016-08-30 NOTE — Telephone Encounter (Signed)
Scheduled appt per 7/9 los - Gave patient AVS and calender per los.  

## 2016-09-24 ENCOUNTER — Encounter: Payer: Self-pay | Admitting: Internal Medicine

## 2016-09-24 ENCOUNTER — Ambulatory Visit (INDEPENDENT_AMBULATORY_CARE_PROVIDER_SITE_OTHER): Payer: Medicare Other | Admitting: Internal Medicine

## 2016-09-24 VITALS — BP 130/74 | HR 74 | Temp 98.5°F | Ht 64.5 in | Wt 251.4 lb

## 2016-09-24 DIAGNOSIS — I10 Essential (primary) hypertension: Secondary | ICD-10-CM

## 2016-09-24 DIAGNOSIS — E119 Type 2 diabetes mellitus without complications: Secondary | ICD-10-CM

## 2016-09-24 DIAGNOSIS — M8949 Other hypertrophic osteoarthropathy, multiple sites: Secondary | ICD-10-CM

## 2016-09-24 DIAGNOSIS — Z800 Family history of malignant neoplasm of digestive organs: Secondary | ICD-10-CM | POA: Diagnosis not present

## 2016-09-24 DIAGNOSIS — M150 Primary generalized (osteo)arthritis: Secondary | ICD-10-CM

## 2016-09-24 DIAGNOSIS — M48061 Spinal stenosis, lumbar region without neurogenic claudication: Secondary | ICD-10-CM | POA: Diagnosis not present

## 2016-09-24 DIAGNOSIS — M159 Polyosteoarthritis, unspecified: Secondary | ICD-10-CM

## 2016-09-24 LAB — MICROALBUMIN / CREATININE URINE RATIO
Creatinine,U: 38.4 mg/dL
Microalb Creat Ratio: 1.8 mg/g (ref 0.0–30.0)
Microalb, Ur: <0.7 mg/dL (ref 0.0–1.9)

## 2016-09-24 LAB — LIPID PANEL
Cholesterol: 193 mg/dL (ref 0–200)
HDL: 50.8 mg/dL (ref >39.00)
LDL Cholesterol: 124 mg/dL — ABNORMAL HIGH (ref 0–99)
NonHDL: 141.76
Total CHOL/HDL Ratio: 4
Triglycerides: 87 mg/dL (ref 0.0–149.0)
VLDL: 17.4 mg/dL (ref 0.0–40.0)

## 2016-09-24 LAB — POCT GLYCOSYLATED HEMOGLOBIN (HGB A1C): Hemoglobin A1C: 6.3

## 2016-09-24 MED ORDER — TRAMADOL HCL 50 MG PO TABS: 50.0000 mg | ORAL_TABLET | Freq: Four times a day (QID) | ORAL | 0 refills | Status: DC | PRN

## 2016-09-24 MED ORDER — ATORVASTATIN CALCIUM 10 MG PO TABS: 10.0000 mg | ORAL_TABLET | Freq: Every day | ORAL | 3 refills | Status: DC

## 2016-09-24 NOTE — Progress Notes (Signed)
Subjective:    Patient ID: Darlene Mclaughlin, female    DOB: [birth year], 66 y.o.   MRN: [id number]  HPI  Lab Results  Component Value Date   HGBA1C 6.6 (H) 03/26/2016   66 year old patient who is seen today for her six-month follow-up.  She has type 2 diabetes which has been controlled on metformin therapy only  Hemoglobin A1c today 6.3  Since her last visit here, she has had a 10 year colonoscopy.  This revealed colonic polyps.  Since her last visit here, she is also had a brother diagnosed with colon cancer.  She will have colonoscopies at five-year intervals She has essential hypertension which has been stable.  She has obesity. No new concerns or complaints  She did have a annual eye examination in March  Past Medical History:  Diagnosis Date  . ALLERGIC RHINITIS 05/15/2008  . Anemia   . ASTHMA 05/15/2008  . Blood transfusion without reported diagnosis 1991   anemia  . Cancer [facility])    DCIS right breast  . Diabetes mellitus without complication ([place])   . GERD (gastroesophageal reflux disease)   . Headache(784.0) 05/15/2008  . HYPERTENSION 05/15/2008  . IMPAIRED GLUCOSE TOLERANCE 05/15/2008  . Obesity   . OSTEOARTHRITIS 05/15/2008   back     Social History   Social History  . Marital status: Widowed    Spouse name: N/A  . Number of children: N/A  . Years of education: N/A   Occupational History  . Retired    Social History Main Topics  . Smoking status: Never Smoker  . Smokeless tobacco: Never Used  . Alcohol use Yes     Comment: RARELY  . Drug use: No  . Sexual activity: Not on file   Other Topics Concern  . Not on file   Social History Narrative  . No narrative on file    Past Surgical History:  Procedure Laterality Date  . ABDOMINAL HYSTERECTOMY    . BREAST LUMPECTOMY WITH RADIOACTIVE SEED LOCALIZATION Right 12/17/2014   Procedure: RIGHT BREAST RADIOACTIVE SEED GUIDED PARTIAL MASTECTOMY;  Surgeon: Erroll Luna, MD;  Location: [place];  Service: General;  Laterality: Right;  . CESAREAN SECTION    . KNEE SURGERY     arthroscopic left    Family History  Problem Relation Age of Onset  . Diabetes Mother   . Hypertension Mother   . Breast cancer Mother 58       Breast cancer twice   . Hyperlipidemia Father   . Colon cancer Brother        4 th brother  . Diabetes Brother   . Pancreatic cancer Brother        middle brother  . Stomach cancer Neg Hx     Allergies  Allergen Reactions  . Macrobid [Nitrofurantoin Monohyd Macro]      Liver problems  . Naproxen   . Zanaflex [Tizanidine Hcl] Other (See Comments)     Liver problems    Current Outpatient Prescriptions on File Prior to Visit  Medication Sig Dispense Refill  . acetaminophen (TYLENOL) 650 MG CR tablet Take 650 mg by mouth every 8 (eight) hours as needed.      Marland Kitchen anastrozole (ARIMIDEX) 1 MG tablet Take 1 tablet (1 mg total) by mouth daily. 90 tablet 3  . aspirin 81 MG tablet Take 81 mg by mouth daily.      Marland Kitchen CALCIUM PO Take 1,000 mg by mouth daily.    Marland Kitchen  chlorthalidone (HYGROTON) 25 MG tablet TAKE 1 TABLET (25 MG TOTAL) BY MOUTH DAILY. 90 tablet 1  . EPIPEN 2-PAK 0.3 MG/0.3ML SOAJ injection 0.3 mg. Reported on 05/01/2015  1  . fluticasone (FLONASE) 50 MCG/ACT nasal spray Place 1 spray into both nostrils daily. 16 g 5  . glucose blood (FREESTYLE LITE) test strip 1 each by Other route 2 (two) times daily. Use as instructed 100 each 12  . metFORMIN (GLUCOPHAGE-XR) 500 MG 24 hr tablet TAKE 2 TABLETS (1,000 MG TOTAL) BY MOUTH DAILY WITH SUPPER. 180 tablet 3  . Multiple Vitamin (MULTIVITAMIN) tablet Take 1 tablet by mouth daily.      . NON FORMULARY WEEKLY ALLERGY INJECTIONS     . pantoprazole (PROTONIX) 40 MG tablet TAKE 1 TABLET (40 MG TOTAL) BY MOUTH DAILY. 90 tablet 3  . potassium chloride SA (KLOR-CON M20) 20 MEQ tablet Take 1 tablet (20 mEq total) by mouth 2 (two) times daily. 90 tablet 2  . PROAIR HFA 108 (90 BASE) MCG/ACT inhaler Inhale 1 puff into  the lungs every 6 (six) hours as needed.   0  . traMADol (ULTRAM) 50 MG tablet TAKE 1 TABLET BY MOUTH EVERY 6 HOURS AS NEEDED FOR PAIN 50 tablet 2   Current Facility-Administered Medications on File Prior to Visit  Medication Dose Route Frequency Provider Last Rate Last Dose  . 0.9 %  sodium chloride infusion  500 mL Intravenous Continuous Milus Banister, MD        BP 130/74 (BP Location: Left Arm, Patient Position: Sitting, Cuff Size: Large)   Pulse 74   Temp 98.5 F (36.9 C) (Oral)   Ht 5' 4.5" (1.638 m)   Wt 251 lb 6.4 oz (114 kg)   BMI 42.49 kg/m      Review of Systems  Constitutional: Negative.   HENT: Negative for congestion, dental problem, hearing loss, rhinorrhea, sinus pressure, sore throat and tinnitus.   Eyes: Negative for pain, discharge and visual disturbance.  Respiratory: Negative for cough and shortness of breath.   Cardiovascular: Positive for leg swelling. Negative for chest pain and palpitations.  Gastrointestinal: Negative for abdominal distention, abdominal pain, blood in stool, constipation, diarrhea, nausea and vomiting.  Genitourinary: Negative for difficulty urinating, dysuria, flank pain, frequency, hematuria, pelvic pain, urgency, vaginal bleeding, vaginal discharge and vaginal pain.  Musculoskeletal: Negative for arthralgias, gait problem and joint swelling.  Skin: Negative for rash.  Neurological: Negative for dizziness, syncope, speech difficulty, weakness, numbness and headaches.  Hematological: Negative for adenopathy.  Psychiatric/Behavioral: Negative for agitation, behavioral problems and dysphoric mood. The patient is not nervous/anxious.        Objective:   Physical Exam  Constitutional: She is oriented to person, place, and time. She appears well-developed and well-nourished.  HENT:  Head: Normocephalic.  Right Ear: External ear normal.  Left Ear: External ear normal.  Mouth/Throat: Oropharynx is clear and moist.  Eyes: Pupils are  equal, round, and reactive to light. Conjunctivae and EOM are normal.  Neck: Normal range of motion. Neck supple. No thyromegaly present.  Cardiovascular: Normal rate, regular rhythm, normal heart sounds and intact distal pulses.   Pulmonary/Chest: Effort normal and breath sounds normal.  Abdominal: Soft. Bowel sounds are normal. She exhibits no mass. There is no tenderness.  Musculoskeletal: Normal range of motion.  Minimal swelling right ankle  Lymphadenopathy:    She has no cervical adenopathy.  Neurological: She is alert and oriented to person, place, and time.  Skin: Skin is warm and  dry. No rash noted.  Psychiatric: She has a normal mood and affect. Her behavior is normal.          Assessment & Plan:   Diabetes mellitus.  Hemoglobin A1c well controlled.  No change in therapy.  Nonpharmacologic management discussed Essential hypertension, stable History breast cancer.  Follow-up oncology Colonic polyps.  Repeat colonoscopy in 5 years Mild dyslipidemia.  We'll start low intensity statin therapy  Nyoka Cowden

## 2016-09-24 NOTE — Patient Instructions (Signed)
Limit your sodium (Salt) intake   Please check your hemoglobin A1c every 6  Months    It is important that you exercise regularly, at least 20 minutes 3 to 4 times per week.  If you develop chest pain or shortness of breath seek  medical attention.  You need to lose weight.  Consider a lower calorie diet and regular exercise.  Return in 6 months for follow-up   

## 2016-10-15 ENCOUNTER — Encounter: Payer: Self-pay | Admitting: Internal Medicine

## 2016-10-15 ENCOUNTER — Ambulatory Visit (INDEPENDENT_AMBULATORY_CARE_PROVIDER_SITE_OTHER): Payer: Medicare Other | Admitting: Internal Medicine

## 2016-10-15 VITALS — BP 132/76 | HR 82 | Temp 98.1°F | Ht 64.5 in | Wt 251.0 lb

## 2016-10-15 DIAGNOSIS — M48061 Spinal stenosis, lumbar region without neurogenic claudication: Secondary | ICD-10-CM | POA: Diagnosis not present

## 2016-10-15 DIAGNOSIS — E119 Type 2 diabetes mellitus without complications: Secondary | ICD-10-CM | POA: Diagnosis not present

## 2016-10-15 DIAGNOSIS — I10 Essential (primary) hypertension: Secondary | ICD-10-CM | POA: Diagnosis not present

## 2016-10-15 NOTE — Patient Instructions (Signed)
Hold atorvastatin for approximately 4 weeks.  Okay to resume if no relationship with the leg discomfort  Follow up with your back doctor if left leg is unimproved  Take tramadol at bedtime  Call or return to clinic prn if these symptoms worsen or fail to improve as anticipated.

## 2016-10-15 NOTE — Progress Notes (Signed)
Subjective:    Patient ID: Darlene Mclaughlin, female    DOB: October 17, 1950, 66 y.o.   MRN: [id number]  HPI 66 year old patient who has type 2 diabetes.  She was seen earlier in the month and statin therapy was started.  She is now on atorvastatin 10 mg daily She does have a history of moderately severe lumbar spinal stenosis.  The past week she has had nocturnal left leg pain.  She goes to the [facility] daily and has no leg discomfort.  Pain is only present through the night.  She is concerned about a possible statin side effect. She describes the pain as a deep muscle aching.  Her spinal stenosis in the past has caused low back pain and right leg discomfort   Past Medical History:  Diagnosis Date  . ALLERGIC RHINITIS 05/15/2008  . Anemia   . ASTHMA 05/15/2008  . Blood transfusion without reported diagnosis 1991   anemia  . Cancer [facility])    DCIS right breast  . Diabetes mellitus without complication ([place])   . GERD (gastroesophageal reflux disease)   . Headache(784.0) 05/15/2008  . HYPERTENSION 05/15/2008  . IMPAIRED GLUCOSE TOLERANCE 05/15/2008  . Obesity   . OSTEOARTHRITIS 05/15/2008   back     Social History   Social History  . Marital status: Widowed    Spouse name: N/A  . Number of children: N/A  . Years of education: N/A   Occupational History  . Retired    Social History Main Topics  . Smoking status: Never Smoker  . Smokeless tobacco: Never Used  . Alcohol use Yes     Comment: RARELY  . Drug use: No  . Sexual activity: Not on file   Other Topics Concern  . Not on file   Social History Narrative  . No narrative on file    Past Surgical History:  Procedure Laterality Date  . ABDOMINAL HYSTERECTOMY    . BREAST LUMPECTOMY WITH RADIOACTIVE SEED LOCALIZATION Right 12/17/2014   Procedure: RIGHT BREAST RADIOACTIVE SEED GUIDED PARTIAL MASTECTOMY;  Surgeon: Erroll Luna, MD;  Location: [place];  Service: General;  Laterality: Right;  . CESAREAN SECTION     . KNEE SURGERY     arthroscopic left    Family History  Problem Relation Age of Onset  . Diabetes Mother   . Hypertension Mother   . Breast cancer Mother 36       Breast cancer twice   . Hyperlipidemia Father   . Colon cancer Brother        4 th brother  . Diabetes Brother   . Pancreatic cancer Brother        middle brother  . Stomach cancer Neg Hx     Allergies  Allergen Reactions  . Macrobid [Nitrofurantoin Monohyd Macro]      Liver problems  . Naproxen   . Zanaflex [Tizanidine Hcl] Other (See Comments)     Liver problems    Current Outpatient Prescriptions on File Prior to Visit  Medication Sig Dispense Refill  . acetaminophen (TYLENOL) 650 MG CR tablet Take 650 mg by mouth every 8 (eight) hours as needed.      Marland Kitchen anastrozole (ARIMIDEX) 1 MG tablet Take 1 tablet (1 mg total) by mouth daily. 90 tablet 3  . aspirin 81 MG tablet Take 81 mg by mouth daily.      Marland Kitchen atorvastatin (LIPITOR) 10 MG tablet Take 1 tablet (10 mg total) by mouth daily. 90 tablet 3  .  CALCIUM PO Take 1,000 mg by mouth daily.    . chlorthalidone (HYGROTON) 25 MG tablet TAKE 1 TABLET (25 MG TOTAL) BY MOUTH DAILY. 90 tablet 1  . EPIPEN 2-PAK 0.3 MG/0.3ML SOAJ injection 0.3 mg. Reported on 05/01/2015  1  . fluticasone (FLONASE) 50 MCG/ACT nasal spray Place 1 spray into both nostrils daily. 16 g 5  . glucose blood (FREESTYLE LITE) test strip 1 each by Other route 2 (two) times daily. Use as instructed 100 each 12  . metFORMIN (GLUCOPHAGE-XR) 500 MG 24 hr tablet TAKE 2 TABLETS (1,000 MG TOTAL) BY MOUTH DAILY WITH SUPPER. 180 tablet 3  . Multiple Vitamin (MULTIVITAMIN) tablet Take 1 tablet by mouth daily.      . NON FORMULARY WEEKLY ALLERGY INJECTIONS     . pantoprazole (PROTONIX) 40 MG tablet TAKE 1 TABLET (40 MG TOTAL) BY MOUTH DAILY. 90 tablet 3  . potassium chloride SA (KLOR-CON M20) 20 MEQ tablet Take 1 tablet (20 mEq total) by mouth 2 (two) times daily. 90 tablet 2  . PROAIR HFA 108 (90 BASE) MCG/ACT  inhaler Inhale 1 puff into the lungs every 6 (six) hours as needed.   0  . traMADol (ULTRAM) 50 MG tablet Take 1 tablet (50 mg total) by mouth every 6 (six) hours as needed. for pain 60 tablet 0   Current Facility-Administered Medications on File Prior to Visit  Medication Dose Route Frequency Provider Last Rate Last Dose  . 0.9 %  sodium chloride infusion  500 mL Intravenous Continuous Milus Banister, MD        BP 132/76 (BP Location: Left Arm, Patient Position: Sitting, Cuff Size: Normal)   Pulse 82   Temp 98.1 F (36.7 C) (Oral)   Ht 5' 4.5" (1.638 m)   Wt 251 lb (113.9 kg)   SpO2 98%   BMI 42.42 kg/m      Review of Systems  Constitutional: Negative.   HENT: Negative for congestion, dental problem, hearing loss, rhinorrhea, sinus pressure, sore throat and tinnitus.   Eyes: Negative for pain, discharge and visual disturbance.  Respiratory: Negative for cough and shortness of breath.   Cardiovascular: Negative for chest pain, palpitations and leg swelling.  Gastrointestinal: Negative for abdominal distention, abdominal pain, blood in stool, constipation, diarrhea, nausea and vomiting.  Genitourinary: Negative for difficulty urinating, dysuria, flank pain, frequency, hematuria, pelvic pain, urgency, vaginal bleeding, vaginal discharge and vaginal pain.  Musculoskeletal: Positive for back pain and myalgias. Negative for arthralgias, gait problem and joint swelling.  Skin: Negative for rash.  Neurological: Negative for dizziness, syncope, speech difficulty, weakness, numbness and headaches.  Hematological: Negative for adenopathy.  Psychiatric/Behavioral: Negative for agitation, behavioral problems and dysphoric mood. The patient is not nervous/anxious.        Objective:   Physical Exam  Constitutional: She appears well-developed and well-nourished. She appears distressed.  Musculoskeletal:  Negative straight leg test  Neurological:  Decreased vibratory sensation distally            Assessment & Plan:   Nocturnal left leg pain.  Doubt a statin side effect, but will put on hold for 1 month and observe.  Will try a bedtime dose of tramadol, which she takes presently only in the morning.  Orthopedic follow-up if unimproved Diabetes  Past Medical History:  Diagnosis Date  . ALLERGIC RHINITIS 05/15/2008  . Anemia   . ASTHMA 05/15/2008  . Blood transfusion without reported diagnosis 1991   anemia  . Cancer ([place])  DCIS right breast  . Diabetes mellitus without complication ([place])   . GERD (gastroesophageal reflux disease)   . Headache(784.0) 05/15/2008  . HYPERTENSION 05/15/2008  . IMPAIRED GLUCOSE TOLERANCE 05/15/2008  . Obesity   . OSTEOARTHRITIS 05/15/2008   back     Social History   Social History  . Marital status: Widowed    Spouse name: N/A  . Number of children: N/A  . Years of education: N/A   Occupational History  . Retired    Social History Main Topics  . Smoking status: Never Smoker  . Smokeless tobacco: Never Used  . Alcohol use Yes     Comment: RARELY  . Drug use: No  . Sexual activity: Not on file   Other Topics Concern  . Not on file   Social History Narrative  . No narrative on file    Past Surgical History:  Procedure Laterality Date  . ABDOMINAL HYSTERECTOMY    . BREAST LUMPECTOMY WITH RADIOACTIVE SEED LOCALIZATION Right 12/17/2014   Procedure: RIGHT BREAST RADIOACTIVE SEED GUIDED PARTIAL MASTECTOMY;  Surgeon: Erroll Luna, MD;  Location: [place];  Service: General;  Laterality: Right;  . CESAREAN SECTION    . KNEE SURGERY     arthroscopic left    Family History  Problem Relation Age of Onset  . Diabetes Mother   . Hypertension Mother   . Breast cancer Mother 40       Breast cancer twice   . Hyperlipidemia Father   . Colon cancer Brother        4 th brother  . Diabetes Brother   . Pancreatic cancer Brother        middle brother  . Stomach cancer Neg Hx     Allergies  Allergen  Reactions  . Macrobid [Nitrofurantoin Monohyd Macro]      Liver problems  . Naproxen   . Zanaflex [Tizanidine Hcl] Other (See Comments)     Liver problems    Current Outpatient Prescriptions on File Prior to Visit  Medication Sig Dispense Refill  . acetaminophen (TYLENOL) 650 MG CR tablet Take 650 mg by mouth every 8 (eight) hours as needed.      Marland Kitchen anastrozole (ARIMIDEX) 1 MG tablet Take 1 tablet (1 mg total) by mouth daily. 90 tablet 3  . aspirin 81 MG tablet Take 81 mg by mouth daily.      Marland Kitchen atorvastatin (LIPITOR) 10 MG tablet Take 1 tablet (10 mg total) by mouth daily. 90 tablet 3  . CALCIUM PO Take 1,000 mg by mouth daily.    . chlorthalidone (HYGROTON) 25 MG tablet TAKE 1 TABLET (25 MG TOTAL) BY MOUTH DAILY. 90 tablet 1  . EPIPEN 2-PAK 0.3 MG/0.3ML SOAJ injection 0.3 mg. Reported on 05/01/2015  1  . fluticasone (FLONASE) 50 MCG/ACT nasal spray Place 1 spray into both nostrils daily. 16 g 5  . glucose blood (FREESTYLE LITE) test strip 1 each by Other route 2 (two) times daily. Use as instructed 100 each 12  . metFORMIN (GLUCOPHAGE-XR) 500 MG 24 hr tablet TAKE 2 TABLETS (1,000 MG TOTAL) BY MOUTH DAILY WITH SUPPER. 180 tablet 3  . Multiple Vitamin (MULTIVITAMIN) tablet Take 1 tablet by mouth daily.      . NON FORMULARY WEEKLY ALLERGY INJECTIONS     . pantoprazole (PROTONIX) 40 MG tablet TAKE 1 TABLET (40 MG TOTAL) BY MOUTH DAILY. 90 tablet 3  . potassium chloride SA (KLOR-CON M20) 20 MEQ tablet Take 1 tablet (20 mEq  total) by mouth 2 (two) times daily. 90 tablet 2  . PROAIR HFA 108 (90 BASE) MCG/ACT inhaler Inhale 1 puff into the lungs every 6 (six) hours as needed.   0  . traMADol (ULTRAM) 50 MG tablet Take 1 tablet (50 mg total) by mouth every 6 (six) hours as needed. for pain 60 tablet 0   Current Facility-Administered Medications on File Prior to Visit  Medication Dose Route Frequency Provider Last Rate Last Dose  . 0.9 %  sodium chloride infusion  500 mL Intravenous Continuous  Milus Banister, MD        BP 132/76 (BP Location: Left Arm, Patient Position: Sitting, Cuff Size: Normal)   Pulse 82   Temp 98.1 F (36.7 C) (Oral)   Ht 5' 4.5" (1.638 m)   Wt 251 lb (113.9 kg)   SpO2 98%   BMI 42.42 kg/m

## 2016-11-02 ENCOUNTER — Other Ambulatory Visit: Payer: Self-pay | Admitting: *Deleted

## 2016-11-02 DIAGNOSIS — Z170 Estrogen receptor positive status [ER+]: Principal | ICD-10-CM

## 2016-11-02 DIAGNOSIS — C50511 Malignant neoplasm of lower-outer quadrant of right female breast: Secondary | ICD-10-CM

## 2016-11-02 MED ORDER — ANASTROZOLE 1 MG PO TABS: 1.0000 mg | ORAL_TABLET | Freq: Every day | ORAL | 3 refills | Status: DC

## 2016-11-11 ENCOUNTER — Ambulatory Visit
Admission: RE | Admit: 2016-11-11 | Discharge: 2016-11-11 | Disposition: A | Discharge status: Home / Self Care | Payer: Medicare Other | Source: Ambulatory Visit | Attending: Hematology | Admitting: Hematology

## 2016-11-11 DIAGNOSIS — D0511 Intraductal carcinoma in situ of right breast: Secondary | ICD-10-CM

## 2016-11-11 HISTORY — DX: Malignant neoplasm of unspecified site of unspecified female breast: C50.919

## 2016-11-28 ENCOUNTER — Other Ambulatory Visit: Payer: Self-pay | Admitting: Internal Medicine

## 2016-11-30 ENCOUNTER — Other Ambulatory Visit: Payer: Self-pay | Admitting: Internal Medicine

## 2016-12-07 ENCOUNTER — Other Ambulatory Visit: Payer: Self-pay | Admitting: Internal Medicine

## 2016-12-22 ENCOUNTER — Other Ambulatory Visit: Payer: Self-pay | Admitting: Internal Medicine

## 2016-12-22 MED ORDER — ATORVASTATIN CALCIUM 10 MG PO TABS: 10.0000 mg | ORAL_TABLET | Freq: Every day | ORAL | 3 refills | Status: DC

## 2016-12-24 ENCOUNTER — Other Ambulatory Visit: Payer: Self-pay | Admitting: Internal Medicine

## 2016-12-24 MED ORDER — SIMVASTATIN 20 MG PO TABS: 20.0000 mg | ORAL_TABLET | Freq: Every day | ORAL | 3 refills | Status: DC

## 2017-01-05 ENCOUNTER — Other Ambulatory Visit: Payer: Self-pay | Admitting: Internal Medicine

## 2017-01-24 ENCOUNTER — Telehealth: Payer: Self-pay | Admitting: Internal Medicine

## 2017-01-24 NOTE — Telephone Encounter (Signed)
Pt dropped off a Disability parking placard form that needs to be completed.  Placed in the doctors folder 01/24/17 pt would like to have a call [phone] when ready for pick up.

## 2017-01-28 NOTE — Telephone Encounter (Signed)
Pt was called and informed that form is ready to be picked up.

## 2017-02-07 ENCOUNTER — Other Ambulatory Visit: Payer: Self-pay | Admitting: Internal Medicine

## 2017-03-01 NOTE — Progress Notes (Signed)
[place] FOLLOW UP NOTE  Patient Care Team: Marletta Lor, MD as PCP - Rosana Hoes, MD as Consulting Physician (General Surgery) Arloa Koh, MD as Consulting Physician (Radiation Oncology) Truitt Merle, MD as Consulting Physician (Hematology) Sylvan Cheese, NP as Nurse Practitioner (Hematology and Oncology)  CHIEF COMPLAINTS:  Follow up right breast DCIS  . Oncology History   Breast cancer of lower-outer quadrant of right female breast [facility])   Staging form: Breast, AJCC 7th Edition     Clinical stage from 11/15/2014: Stage 0 (Tis (DCIS), N0, M0) - Signed by Truitt Merle, MD on 11/26/2014       Ductal carcinoma in situ (DCIS) of right breast   11/06/2014 Mammogram    Screening mammogram showed calcifications in the lower outer quadrant of the right breast, which was confirmed by diagnostic mammogram      11/15/2014 Initial Biopsy    Breast core needle biopsy showed ductal carcinoma in situ with calcifications.      11/15/2014 Receptors her2    ER+ (100%); PR+ (90%)      11/15/2014 Clinical Stage    Stage 0: Tis N0      12/17/2014 Surgery    Right breast lumpectomy.      12/17/2014 Pathology Results    Right breast lumpectomy showed usual ductal Hyperplasia and fibroadenoma. No residual ductal carcinoma in situ.        Radiation Therapy    Not recommended due to lack of residual malignancy at time of definitive surgery      03/28/2015 -  Anti-estrogen oral therapy    Anastrozole 1 mg daily begun 03/28/2015      05/01/2015 Survivorship    Survivorship visit completed and copy of care plan provided to patient      11/06/2015 Imaging    MM DIAG BREAST TOMO BILATERAL 11/06/15 IMPRESSION: No mammographic evidence of malignancy. BI-RADS CATEGORY  2: Benign.      08/04/2016 Pathology Results    Diagnosis  Surgical [P], cecum, polyp - TUBULAR ADENOMA. - NO HIGH GRADE DYSPLASIA OR MALIGNANCY.      08/04/2016 Procedure     Colonoscopy A 2 mm polyp was found in the cecum. The polyp was sessile. The polyp was removed with a cold snare. Resection and retrieval were complete. Findings: - The exam was otherwise without abnormality on direct and retroflexion views.      11/11/2016 Mammogram    IMPRESSION: 1. No mammographic evidence of malignancy. 2. Stable right breast postsurgical changes.       HISTORY OF PRESENTING ILLNESS:  Darlene Mclaughlin 67 y.o. female is here because of recent diagnosis of right breast DCIS.   The cancer was detected by screening mammogram. She does mammogram every year. The cancer was not palpable prior to diagnosis. She denies any other new symptoms.  She has chronic back pain, and had epidural injection twice which helped a lot. She also has chronic arthritis, especially knees and feet, she takes tramadol for both back and knee pain, she takes 0-1tab/day.  She denies any other symptoms. She has good appetite and eats well. She has good energy level. She is widowed, lives alone and very independent. She has 2 sons and 3 grandchildren, and she sees him often. In terms of breast cancer risk profile:  She menarched at early age of 38 and went to menopause at age 42 (hysrectomy)  She had 2 pregnancy and 2 children, her first child was born at age 73  She did not breast-fed her child.  She received birth control pills for approximately 5 years  She was never exposed to fertility medications or hormone replacement therapy.  She has family history of Breast cancer (mother)   CURRENT THERAPY: anastrozole '1mg'$  daily started on 03/28/2015  INTERIM HISTORY: NEDA WILLENBRING returns for follow-up. She reports that she is doing well overall. She is tolerating the anastrozole well. Pt reports no hot flashes. She notes some joint pain that is worse at night in her left arm and hand. The pain subsides with repositioning her arm and flexing with a stress ball. Of note since the patient last visit, she  has had a diagnostic mammogram completed on 11/11/16 with results revealing no mammographic evidence of malignancy and stable right breast postsurgical changes. Pt does perform self breast exams regularly. She endorses water aerobic exercise 3 times weekly.     On review of systems, pt reports no other complaints at this time.Marland Kitchen    MEDICAL HISTORY:  Past Medical History:  Diagnosis Date  . ALLERGIC RHINITIS 05/15/2008  . Anemia   . ASTHMA 05/15/2008  . Blood transfusion without reported diagnosis 1991   anemia  . Breast cancer ([place]) 2016   right breast  . Cancer ([place])    DCIS right breast  . Diabetes mellitus without complication ([place])   . GERD (gastroesophageal reflux disease)   . Headache(784.0) 05/15/2008  . HYPERTENSION 05/15/2008  . IMPAIRED GLUCOSE TOLERANCE 05/15/2008  . Obesity   . OSTEOARTHRITIS 05/15/2008   back    SURGICAL HISTORY: Past Surgical History:  Procedure Laterality Date  . ABDOMINAL HYSTERECTOMY    . BREAST LUMPECTOMY WITH RADIOACTIVE SEED LOCALIZATION Right 12/17/2014   Procedure: RIGHT BREAST RADIOACTIVE SEED GUIDED PARTIAL MASTECTOMY;  Surgeon: Erroll Luna, MD;  Location: [facility];  Service: General;  Laterality: Right;  . CESAREAN SECTION    . KNEE SURGERY     arthroscopic left    SOCIAL HISTORY: Social History   Socioeconomic History  . Marital status: Widowed    Spouse name: Not on file  . Number of children: Not on file  . Years of education: Not on file  . Highest education level: Not on file  Social Needs  . Financial resource strain: Not on file  . Food insecurity - worry: Not on file  . Food insecurity - inability: Not on file  . Transportation needs - medical: Not on file  . Transportation needs - non-medical: Not on file  Occupational History  . Occupation: Retired  Tobacco Use  . Smoking status: Never Smoker  . Smokeless tobacco: Never Used  Substance and Sexual Activity  . Alcohol use: Yes    Comment: RARELY   . Drug use: No  . Sexual activity: Not on file  Other Topics Concern  . Not on file  Social History Narrative  . Not on file    FAMILY HISTORY: Family History  Problem Relation Age of Onset  . Diabetes Mother   . Hypertension Mother   . Breast cancer Mother 58       Breast cancer twice   . Hyperlipidemia Father   . Colon cancer Brother        4 th brother  . Diabetes Brother   . Pancreatic cancer Brother        middle brother  . Stomach cancer Neg Hx     ALLERGIES:  is allergic to macrobid [nitrofurantoin monohyd macro]; naproxen; and zanaflex [tizanidine hcl].  MEDICATIONS:  Current Outpatient Medications  Medication Sig Dispense Refill  . acetaminophen (TYLENOL) 650 MG CR tablet Take 650 mg by mouth every 8 (eight) hours as needed.      Marland Kitchen anastrozole (ARIMIDEX) 1 MG tablet Take 1 tablet (1 mg total) by mouth daily. 90 tablet 3  . aspirin 81 MG tablet Take 81 mg by mouth daily.      Marland Kitchen CALCIUM PO Take 1,000 mg by mouth daily.    . chlorthalidone (HYGROTON) 25 MG tablet TAKE 1 TABLET (25 MG TOTAL) BY MOUTH DAILY. 90 tablet 1  . EPIPEN 2-PAK 0.3 MG/0.3ML SOAJ injection 0.3 mg. Reported on 05/01/2015  1  . fluticasone (FLONASE) 50 MCG/ACT nasal spray Place 1 spray into both nostrils daily. 16 g 5  . glucose blood (FREESTYLE LITE) test strip 1 each by Other route 2 (two) times daily. Use as instructed 100 each 12  . KLOR-CON M20 20 MEQ tablet TAKE 1 TABLET (20 MEQ TOTAL) BY MOUTH 2 (TWO) TIMES DAILY. 90 tablet 2  . Melatonin 10 MG TABS Take 1 tablet by mouth at bedtime.    . metFORMIN (GLUCOPHAGE-XR) 500 MG 24 hr tablet TAKE 2 TABLETS (1,000 MG TOTAL) BY MOUTH DAILY WITH SUPPER. 180 tablet 3  . Multiple Vitamin (MULTIVITAMIN) tablet Take 1 tablet by mouth daily.      . NON FORMULARY WEEKLY ALLERGY INJECTIONS     . pantoprazole (PROTONIX) 40 MG tablet TAKE 1 TABLET (40 MG TOTAL) BY MOUTH DAILY. 90 tablet 3  . PROAIR HFA 108 (90 BASE) MCG/ACT inhaler Inhale 1 puff into the lungs  every 6 (six) hours as needed.   0  . traMADol (ULTRAM) 50 MG tablet TAKE 1 TABLET BY MOUTH EVERY 6 HOURS AS NEEDED FOR PAIN 60 tablet 0  . simvastatin (ZOCOR) 20 MG tablet Take 1 tablet (20 mg total) by mouth at bedtime. (Patient not taking: Reported on 03/03/2017) 90 tablet 3   Current Facility-Administered Medications  Medication Dose Route Frequency Provider Last Rate Last Dose  . 0.9 %  sodium chloride infusion  500 mL Intravenous Continuous Milus Banister, MD        REVIEW OF SYSTEMS:   Constitutional: Denies fevers, chills or abnormal night sweats (+)insominia  Eyes: Denies blurriness of vision, double vision or watery eyes Ears, nose, mouth, throat, and face: Denies mucositis or sore throat Respiratory: Denies cough, dyspnea or wheezes Cardiovascular: Denies palpitation, chest discomfort or lower extremity swelling Gastrointestinal:  Denies nausea, heartburn or change in bowel habits Skin: Denies abnormal skin rashes Lymphatics: Denies new lymphadenopathy or easy bruising Neurological:Denies numbness, tingling or new weaknesses Behavioral/Psych: Mood is stable, no new changes  All other systems were reviewed with the patient and are negative.  PHYSICAL EXAMINATION:  ECOG PERFORMANCE STATUS: 1 - Symptomatic but completely ambulatory  Vitals:   03/03/17 1108  BP: 130/63  Pulse: 72  Resp: 20  Temp: 98.5 F (36.9 C)  SpO2: 99%   Filed Weights   03/03/17 1108  Weight: 248 lb 1.6 oz (112.5 kg)    GENERAL:alert, no distress and comfortable SKIN: skin color, texture, turgor are normal, no rashes or significant lesions EYES: normal, conjunctiva are pink and non-injected, sclera clear OROPHARYNX:no exudate, no erythema and lips, buccal mucosa, and tongue normal  NECK: supple, thyroid normal size, non-tender, without nodularity LYMPH:  no palpable lymphadenopathy in the cervical, axillary or inguinal LUNGS: clear to auscultation and percussion with normal breathing  effort HEART: regular rate & rhythm and no murmurs  and no lower extremity edema ABDOMEN:abdomen soft, non-tender and normal bowel sounds Musculoskeletal:no cyanosis of digits and no clubbing  PSYCH: alert & oriented x 3 with fluent speech NEURO: no focal motor/sensory deficits Breasts: Breast inspection showed them to be symmetrical with no nipple discharge. Right breast incision site is clean and has healed well.  Palpation of the left breast and axilla revealed no obvious mass that I could appreciate.   LABORATORY DATA:  I have reviewed the data as listed CBC Latest Ref Rng & Units 03/03/2017 08/30/2016 03/01/2016  WBC 3.9 - 10.3 K/uL 6.8 7.7 7.4  Hemoglobin 11.6 - 15.9 g/dL 13.0 12.9 13.4  Hematocrit 34.8 - 46.6 % 40.3 38.9 39.5  Platelets 145 - 400 K/uL 194 187 197   CMP Latest Ref Rng & Units 03/03/2017 08/30/2016 03/01/2016  Glucose 70 - 140 mg/dL 118 118 143(H)  BUN 7 - 26 mg/dL 13 16.1 10.3  Creatinine 0.60 - 1.10 mg/dL 0.75 0.7 0.7  Sodium 136 - 145 mmol/L 141 140 141  Potassium 3.3 - 4.7 mmol/L 3.4 3.4(L) 3.3(L)  Chloride 98 - 109 mmol/L 102 - -  CO2 22 - 29 mmol/L 30(H) 29 27  Calcium 8.4 - 10.4 mg/dL 9.7 10.0 10.1  Total Protein 6.4 - 8.3 g/dL 7.8 7.9 8.0  Total Bilirubin 0.2 - 1.2 mg/dL 0.5 0.35 0.55  Alkaline Phos 40 - 150 U/L 85 82 84  AST 5 - 34 U/L _0 ALT 0 - 55 U/L _1 PATHOLOGY REPORT Diagnosis 08/04/2016 Surgical [P], cecum, polyp - TUBULAR ADENOMA. - NO HIGH GRADE DYSPLASIA OR MALIGNANCY.  Diagnosis 11/15/2014 Breast, right, needle core biopsy, lower outer - DUCTAL CARCINOMA IN SITU WITH CALCIFICATIONS. - FIBROADENOMA. - SEE COMMENT. Results: IMMUNOHISTOCHEMICAL AND MORPHOMETRIC ANALYSIS PERFORMED MANUALLY Estrogen Receptor: 100%, POSITIVE, STRONG STAINING INTENSITY Progesterone Receptor: 90%, POSITIVE, STRONG STAINING INTENSITY  Diagnosis 12/17/2014 Breast, lumpectomy, Right USUAL DUCTAL HYPERPLASIA AND FIBROADENOMA BIOPSY SITE  CHANGES NO RESIDUAL DUCTAL CARCINOMA IN SITU IS IDENTIFIED  PROCEDURES  08/04/2016 colonoscopy A 2 mm polyp was found in the cecum. The polyp was sessile. The polyp was removed with a cold snare. Resection and retrieval were complete. Findings: - The exam was otherwise without abnormality on direct and retroflexion views.   RADIOGRAPHIC STUDIES: I have personally reviewed the radiological images as listed and agreed with the findings in the report.  MM DIAG BREAST TOMO BILATERAL 11/11/16 IMPRESSION: 1. No mammographic evidence of malignancy. 2. Stable right breast postsurgical changes.  MM DIAG BREAST TOMO BILATERAL 11/06/15 IMPRESSION: No mammographic evidence of malignancy. BI-RADS CATEGORY  2: Benign.  Mm Digital Diagnostic Unilat R 11/12/2014      IMPRESSION: Grouped punctate and amorphous calcifications confirmed within the lower outer quadrant of the right breast, 6-7 o'clock axis region, for which stereotactic biopsy is recommended.  RECOMMENDATION: Stereotactic biopsy, possibly with tomosynthesis guidance, for the right breast calcifications.  Stereotactic biopsy is scheduled for September 23rd.  I have discussed the findings and recommendations with the patient. Results were also provided in writing at the conclusion of the visit. If applicable, a reminder letter will be sent to the patient regarding the next appointment.  BI-RADS CATEGORY  4: Suspicious.   Electronically Signed   By: Franki Cabot M.D.   On: 11/12/2014 12:09   Mm Digital Screening Bilateral 11/06/2014   ACR Breast Density Category b: There are scattered areas of fibroglandular density.  FINDINGS: In the right breast, calcifications warrant  further evaluation with magnified views. In the left breast, no findings suspicious for malignancy. Images were processed with CAD.  IMPRESSION: Further evaluation is suggested for calcifications in the right breast.  RECOMMENDATION: Diagnostic mammogram of the right breast.  (Code:[id number])  The patient will be contacted regarding the findings, and additional imaging will be scheduled.  BI-RADS CATEGORY  0: Incomplete. Need additional imaging evaluation and/or prior mammograms for comparison.   Electronically Signed   By: Nolon Nations M.D.   On: 11/06/2014 10:27     ASSESSMENT: 67 y.o. African-American female, with past medical history of hypertension, arthritis, chronic back pain, obesity, and the well-controlled asthma, who was found to have right breast DCIS by screening mammogram.  PLAN:  #1 right breast DCIS, ER/PR strongly positive -I reviewed her surgical pathology results, which showed no residual malignant cells.The patient had early stage disease. She  Is cured.  -Any form of adjuvant treatment is for preventive  -She is on anastrozole for chemoprevention, plan for total 5 years. She is tolerating well, we'll continue. -Continue breast cancer surveillance with annual screening mammogram, self exam and routine follow-up. -I reviewed her lab results, CBC and a CMP are within normal limits as of 08/30/2016. She is clinically doing well, no evidence of recurrence. -I previously encouraged her to eat healthy and accessed regularly. She is not a very active due to the back pain and arthritis, I also encouraged her to consider weight loss, she is motivated. -Diagnostic mammogram on 11/11/16 revealed no mammographic evidence of malignancy and stable right breast postsurgical changes.  - Will order bone density scan and mammorgram to be completed in September 2019.  - Labs reviewed, CBC and CMP are WNL, exam is unremarkable, she is clinically doing well, currently no concern for breast cancer recurrence.  We will continue surveillance. - follow up in 6 months with labs -Continue Anastrozole for 3 more years.  #2 Osteopenia  -Repeated a bone density scan in February 2017 showed osteopenia, no high risk for fracture. I reviewed with her  -She is on multiple vitamin  which contains vitamin D thousand unit -I previously encouraged her to take calcium 600 mg once daily -I continued to encourage her to exercise regularly, she does water exercise  -Pt is due for a bone density scan. She will schedule this in September 2019.   #3. Hypertension, diabetes, arthritis -Continue to follow up with her primary care physician - Pt's BP is 130/63 today (03/03/17)  #4 hypokalemia -Secondary to chlorthalidone  -She'll continue potassium 1 tablet day, and try to eat potassium-rich foods such as bananas. -KCL 3.4 today   #5 Obesity  -Continued to encourage exercise and diet. -Pt has lost some healthy weigh due to water aerobics 3 times weekly  #6 Insomnia - she would like to try Melatonin  Plan Continue anastrozole 1 mg once daily Lab and f/u in 6 months  Mammogram and DEXA in 10/2017     l questions were answered. The patient knows to call the clinic with any problems, questions or concerns.  I spent 15 minutes counseling the patient face to face. The total time spent in the appointment was 20 minutes and more than 50% was on counseling.     Truitt Merle, MD 03/03/2017   This document serves as a record of services personally performed by Truitt Merle, MD. It was created on her behalf by Theresia Bough, a trained medical scribe. The creation of this record is based on the scribe's personal observations  and the provider's statements to them.   I have reviewed the above documentation for accuracy and completeness, and I agree with the above.

## 2017-03-03 ENCOUNTER — Inpatient Hospital Stay: Payer: Medicare Other | Attending: Hematology | Admitting: Hematology

## 2017-03-03 ENCOUNTER — Inpatient Hospital Stay: Payer: Medicare Other

## 2017-03-03 ENCOUNTER — Telehealth: Payer: Self-pay | Admitting: Hematology

## 2017-03-03 ENCOUNTER — Encounter: Payer: Self-pay | Admitting: Hematology

## 2017-03-03 VITALS — BP 130/63 | HR 72 | Temp 98.5°F | Resp 20 | Ht 64.5 in | Wt 248.1 lb

## 2017-03-03 DIAGNOSIS — D0511 Intraductal carcinoma in situ of right breast: Secondary | ICD-10-CM | POA: Diagnosis not present

## 2017-03-03 DIAGNOSIS — E876 Hypokalemia: Secondary | ICD-10-CM | POA: Diagnosis not present

## 2017-03-03 DIAGNOSIS — E119 Type 2 diabetes mellitus without complications: Secondary | ICD-10-CM | POA: Insufficient documentation

## 2017-03-03 DIAGNOSIS — Z7982 Long term (current) use of aspirin: Secondary | ICD-10-CM | POA: Diagnosis not present

## 2017-03-03 DIAGNOSIS — M8580 Other specified disorders of bone density and structure, unspecified site: Secondary | ICD-10-CM | POA: Insufficient documentation

## 2017-03-03 DIAGNOSIS — Z79811 Long term (current) use of aromatase inhibitors: Secondary | ICD-10-CM | POA: Diagnosis not present

## 2017-03-03 DIAGNOSIS — E2839 Other primary ovarian failure: Secondary | ICD-10-CM

## 2017-03-03 DIAGNOSIS — Z923 Personal history of irradiation: Secondary | ICD-10-CM | POA: Diagnosis not present

## 2017-03-03 DIAGNOSIS — I10 Essential (primary) hypertension: Secondary | ICD-10-CM | POA: Insufficient documentation

## 2017-03-03 DIAGNOSIS — Z6841 Body Mass Index (BMI) 40.0 and over, adult: Secondary | ICD-10-CM

## 2017-03-03 DIAGNOSIS — G4700 Insomnia, unspecified: Secondary | ICD-10-CM | POA: Diagnosis not present

## 2017-03-03 DIAGNOSIS — Z170 Estrogen receptor positive status [ER+]: Secondary | ICD-10-CM | POA: Insufficient documentation

## 2017-03-03 DIAGNOSIS — J45909 Unspecified asthma, uncomplicated: Secondary | ICD-10-CM | POA: Diagnosis not present

## 2017-03-03 DIAGNOSIS — M1990 Unspecified osteoarthritis, unspecified site: Secondary | ICD-10-CM | POA: Insufficient documentation

## 2017-03-03 DIAGNOSIS — Z7984 Long term (current) use of oral hypoglycemic drugs: Secondary | ICD-10-CM | POA: Diagnosis not present

## 2017-03-03 DIAGNOSIS — K219 Gastro-esophageal reflux disease without esophagitis: Secondary | ICD-10-CM | POA: Diagnosis not present

## 2017-03-03 DIAGNOSIS — E669 Obesity, unspecified: Secondary | ICD-10-CM | POA: Diagnosis not present

## 2017-03-03 DIAGNOSIS — Z79899 Other long term (current) drug therapy: Secondary | ICD-10-CM | POA: Diagnosis not present

## 2017-03-03 LAB — COMPREHENSIVE METABOLIC PANEL
ALT: 15 U/L (ref 0–55)
AST: 16 U/L (ref 5–34)
Albumin: 3.8 g/dL (ref 3.5–5.0)
Alkaline Phosphatase: 85 U/L (ref 40–150)
Anion gap: 9 (ref 3–11)
BUN: 13 mg/dL (ref 7–26)
CO2: 30 mmol/L — ABNORMAL HIGH (ref 22–29)
Calcium: 9.7 mg/dL (ref 8.4–10.4)
Chloride: 102 mmol/L (ref 98–109)
Creatinine, Ser: 0.75 mg/dL (ref 0.60–1.10)
GFR calc Af Amer: >60 mL/min (ref >60)
GFR calc non Af Amer: >60 mL/min (ref >60)
Glucose, Bld: 118 mg/dL (ref 70–140)
Potassium: 3.4 mmol/L (ref 3.3–4.7)
Sodium: 141 mmol/L (ref 136–145)
Total Bilirubin: 0.5 mg/dL (ref 0.2–1.2)
Total Protein: 7.8 g/dL (ref 6.4–8.3)

## 2017-03-03 LAB — CBC WITH DIFFERENTIAL/PLATELET
Basophils Absolute: 0 10*3/uL (ref 0.0–0.1)
Basophils Relative: 0 %
Eosinophils Absolute: 0.1 10*3/uL (ref 0.0–0.5)
Eosinophils Relative: 2 %
HCT: 40.3 % (ref 34.8–46.6)
Hemoglobin: 13 g/dL (ref 11.6–15.9)
Lymphocytes Relative: 27 %
Lymphs Abs: 1.8 10*3/uL (ref 0.9–3.3)
MCH: 29.8 pg (ref 25.1–34.0)
MCHC: 32.3 g/dL (ref 31.5–36.0)
MCV: 92.4 fL (ref 79.5–101.0)
Monocytes Absolute: 0.4 10*3/uL (ref 0.1–0.9)
Monocytes Relative: 6 %
Neutro Abs: 4.4 10*3/uL (ref 1.5–6.5)
Neutrophils Relative %: 65 %
Platelets: 194 10*3/uL (ref 145–400)
RBC: 4.36 MIL/uL (ref 3.70–5.45)
RDW: 13.3 % (ref 11.2–16.1)
WBC: 6.8 10*3/uL (ref 3.9–10.3)

## 2017-03-03 NOTE — Telephone Encounter (Signed)
Scheduled appt per 1/10 los - Gave patient aVS and calender per los.

## 2017-03-11 ENCOUNTER — Other Ambulatory Visit: Payer: Self-pay | Admitting: Internal Medicine

## 2017-03-29 ENCOUNTER — Ambulatory Visit: Payer: Medicare Other | Admitting: Internal Medicine

## 2017-04-06 ENCOUNTER — Encounter: Payer: Self-pay | Admitting: Internal Medicine

## 2017-04-06 ENCOUNTER — Ambulatory Visit: Payer: Medicare Other | Admitting: Internal Medicine

## 2017-04-06 VITALS — BP 112/64 | HR 97 | Temp 98.6°F | Ht 64.5 in | Wt 247.4 lb

## 2017-04-06 DIAGNOSIS — L600 Ingrowing nail: Secondary | ICD-10-CM | POA: Diagnosis not present

## 2017-04-06 DIAGNOSIS — I10 Essential (primary) hypertension: Secondary | ICD-10-CM

## 2017-04-06 DIAGNOSIS — M159 Polyosteoarthritis, unspecified: Secondary | ICD-10-CM

## 2017-04-06 DIAGNOSIS — E119 Type 2 diabetes mellitus without complications: Secondary | ICD-10-CM | POA: Diagnosis not present

## 2017-04-06 DIAGNOSIS — M150 Primary generalized (osteo)arthritis: Secondary | ICD-10-CM

## 2017-04-06 DIAGNOSIS — M8949 Other hypertrophic osteoarthropathy, multiple sites: Secondary | ICD-10-CM

## 2017-04-06 LAB — POCT GLYCOSYLATED HEMOGLOBIN (HGB A1C): Hemoglobin A1C: 6.2

## 2017-04-06 MED ORDER — TRAMADOL HCL 50 MG PO TABS: 50.0000 mg | ORAL_TABLET | Freq: Four times a day (QID) | ORAL | 0 refills | Status: DC | PRN

## 2017-04-06 NOTE — Progress Notes (Signed)
Subjective:    Patient ID: Darlene Mclaughlin, female    DOB: 06-Jun-1950, 67 y.o.   MRN: [id number]  HPI Lab Results  Component Value Date   HGBA1C 6.2 04/06/2017    67 year old patient who is seen today for her biannual follow-up.  She has type 2 diabetes which has been well controlled on metformin therapy.  Wt Readings from Last 3 Encounters:  04/06/17 247 lb 6.4 oz (112.2 kg)  03/03/17 248 lb 1.6 oz (112.5 kg)  10/15/16 251 lb (113.9 kg)   She is followed by oncology with a history of breast cancer. She complains of a painful ingrown toenail.  She has seen podiatry in the past and she is requesting a follow-up.  She has essential hypertension which has been well controlled. No cardiopulmonary complaints  She complains of some paresthesias involving the left arm.  Past Medical History:  Diagnosis Date  . ALLERGIC RHINITIS 05/15/2008  . Anemia   . ASTHMA 05/15/2008  . Blood transfusion without reported diagnosis 1991   anemia  . Breast cancer ([place]) 2016   right breast  . 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 Diabetes mellitus without complication ([place])   . GERD (gastroesophageal reflux disease)   . Headache(784.0) 05/15/2008  . HYPERTENSION 05/15/2008  . IMPAIRED GLUCOSE TOLERANCE 05/15/2008  . Obesity   . OSTEOARTHRITIS 05/15/2008   back     Social History   Socioeconomic History  . Marital status: Widowed    Spouse name: Not on file  . Number of children: Not on file  . Years of education: Not on file  . Highest education level: Not on file  Social Needs  . Financial resource strain: Not on file  . Food insecurity - worry: Not on file  . Food insecurity - inability: Not on file  . Transportation needs - medical: Not on file  . Transportation needs - non-medical: Not on file  Occupational History  . Occupation: Retired  Tobacco Use  . Smoking status: Never Smoker  . Smokeless tobacco: Never Used  Substance and Sexual Activity  . Alcohol use: Yes    Comment:  RARELY  . Drug use: No  . Sexual activity: Not on file  Other Topics Concern  . Not on file  Social History Narrative  . Not on file    Past Surgical History:  Procedure Laterality Date  . ABDOMINAL HYSTERECTOMY    . BREAST LUMPECTOMY WITH RADIOACTIVE SEED LOCALIZATION Right 12/17/2014   Procedure: RIGHT BREAST RADIOACTIVE SEED GUIDED PARTIAL MASTECTOMY;  Surgeon: Erroll Luna, MD;  Location: [facility];  Service: General;  Laterality: Right;  . CESAREAN SECTION    . KNEE SURGERY     arthroscopic left    Family History  Problem Relation Age of Onset  . Diabetes Mother   . Hypertension Mother   . Breast cancer Mother 13       Breast cancer twice   . Hyperlipidemia Father   . Colon cancer Brother        4 th brother  . Diabetes Brother   . Pancreatic cancer Brother        middle brother  . Stomach cancer Neg Hx     Allergies  Allergen Reactions  . Macrobid [Nitrofurantoin Monohyd Macro]      Liver problems  . Naproxen   . Zanaflex [Tizanidine Hcl] Other (See Comments)     Liver problems    Current Outpatient Medications on File Prior  to Visit  Medication Sig Dispense Refill  . acetaminophen (TYLENOL) 650 MG CR tablet Take 650 mg by mouth every 8 (eight) hours as needed.      Marland Kitchen anastrozole (ARIMIDEX) 1 MG tablet Take 1 tablet (1 mg total) by mouth daily. 90 tablet 3  . aspirin 81 MG tablet Take 81 mg by mouth daily.      Marland Kitchen CALCIUM PO Take 1,000 mg by mouth daily.    . chlorthalidone (HYGROTON) 25 MG tablet TAKE 1 TABLET (25 MG TOTAL) BY MOUTH DAILY. 90 tablet 1  . EPIPEN 2-PAK 0.3 MG/0.3ML SOAJ injection 0.3 mg. Reported on 05/01/2015  1  . fluticasone (FLONASE) 50 MCG/ACT nasal spray Place 1 spray into both nostrils daily. 16 g 5  . glucose blood (FREESTYLE LITE) test strip 1 each by Other route 2 (two) times daily. Use as instructed 100 each 12  . KLOR-CON M20 20 MEQ tablet TAKE 1 TABLET (20 MEQ TOTAL) BY MOUTH 2 (TWO) TIMES DAILY. 90 tablet 2  .  Melatonin 10 MG TABS Take 1 tablet by mouth at bedtime.    . metFORMIN (GLUCOPHAGE-XR) 500 MG 24 hr tablet TAKE 2 TABLETS (1,000 MG TOTAL) BY MOUTH DAILY WITH SUPPER. 180 tablet 3  . Multiple Vitamin (MULTIVITAMIN) tablet Take 1 tablet by mouth daily.      . NON FORMULARY WEEKLY ALLERGY INJECTIONS     . pantoprazole (PROTONIX) 40 MG tablet TAKE 1 TABLET (40 MG TOTAL) BY MOUTH DAILY. 90 tablet 3  . PROAIR HFA 108 (90 BASE) MCG/ACT inhaler Inhale 1 puff into the lungs every 6 (six) hours as needed.   0  . traMADol (ULTRAM) 50 MG tablet TAKE 1 TABLET EVERY 6 HOURS AS NEEDED FOR PAIN 60 tablet 0  . simvastatin (ZOCOR) 20 MG tablet Take 1 tablet (20 mg total) by mouth at bedtime. (Patient not taking: Reported on 03/03/2017) 90 tablet 3   Current Facility-Administered Medications on File Prior to Visit  Medication Dose Route Frequency Provider Last Rate Last Dose  . 0.9 %  sodium chloride infusion  500 mL Intravenous Continuous Milus Banister, MD        BP 112/64 (BP Location: Right Arm, Patient Position: Sitting, Cuff Size: Large)   Pulse 97   Temp 98.6 F (37 C) (Oral)   Ht 5' 4.5" (1.638 m)   Wt 247 lb 6.4 oz (112.2 kg)   SpO2 99%   BMI 41.81 kg/m      Review of Systems  Constitutional: Negative.   HENT: Negative for congestion, dental problem, hearing loss, rhinorrhea, sinus pressure, sore throat and tinnitus.   Eyes: Negative for pain, discharge and visual disturbance.  Respiratory: Negative for cough and shortness of breath.   Cardiovascular: Negative for chest pain, palpitations and leg swelling.  Gastrointestinal: Negative for abdominal distention, abdominal pain, blood in stool, constipation, diarrhea, nausea and vomiting.  Genitourinary: Negative for difficulty urinating, dysuria, flank pain, frequency, hematuria, pelvic pain, urgency, vaginal bleeding, vaginal discharge and vaginal pain.  Musculoskeletal: Negative for arthralgias, gait problem and joint swelling.  Skin:  Negative for rash.  Neurological: Positive for numbness. Negative for dizziness, syncope, speech difficulty, weakness and headaches.  Hematological: Negative for adenopathy.  Psychiatric/Behavioral: Negative for agitation, behavioral problems and dysphoric mood. The patient is not nervous/anxious.        Objective:   Physical Exam  Constitutional: She is oriented to person, place, and time. She appears well-developed and well-nourished.      HENT:  Head: Normocephalic.  Right Ear: External ear normal.  Left Ear: External ear normal.  Mouth/Throat: Oropharynx is clear and moist.  Eyes: Conjunctivae and EOM are normal. Pupils are equal, round, and reactive to light.  Neck: Normal range of motion. Neck supple. No thyromegaly present.  Cardiovascular: Normal rate, regular rhythm, normal heart sounds and intact distal pulses.  Pulmonary/Chest: Effort normal and breath sounds normal.  Abdominal: Soft. Bowel sounds are normal. She exhibits no mass. There is no tenderness.  Musculoskeletal: Normal range of motion.  Lymphadenopathy:    She has no cervical adenopathy.  Neurological: She is alert and oriented to person, place, and time.  Skin: Skin is warm and dry. No rash noted.  Psychiatric: She has a normal mood and affect. Her behavior is normal.          Assessment & Plan:   Diabetes mellitus.  Well controlled on metformin therapy. Obesity episode ongoing weight loss encouraged Essential hypertension stable Ingrown toenail.  Will refer to podiatry per patient request Osteoarthritis Probable carpal tunnel left arm.  We will try a nocturnal wrist splint  CPX 6 months  Nyoka Cowden

## 2017-04-06 NOTE — Patient Instructions (Addendum)
Limit your sodium (Salt) intake   Please check your hemoglobin A1c every 3-6 months  Please check your blood pressure on a regular basis.  If it is consistently greater than 130/90, please make an office appointment.    It is important that you exercise regularly, at least 20 minutes 3 to 4 times per week.  If you develop chest pain or shortness of breath seek  medical attention.  You need to lose weight.  Consider a lower calorie diet and regular exercise.   Carpal Tunnel Syndrome Carpal tunnel syndrome is a condition that causes pain in your hand and arm. The carpal tunnel is a narrow area located on the palm side of your wrist. Repeated wrist motion or certain diseases may cause swelling within the tunnel. This swelling pinches the main nerve in the wrist (median nerve). What are the causes? This condition may be caused by:  Repeated wrist motions.  Wrist injuries.  Arthritis.  A cyst or tumor in the carpal tunnel.  Fluid buildup during pregnancy.  Sometimes the cause of this condition is not known. What increases the risk? This condition is more likely to develop in:  People who have jobs that cause them to repeatedly move their wrists in the same motion, such as Art gallery manager.  Women.  People with certain conditions, such as: ? Diabetes. ? Obesity. ? An underactive thyroid (hypothyroidism). ? Kidney failure.  What are the signs or symptoms? Symptoms of this condition include:  A tingling feeling in your fingers, especially in your thumb, index, and middle fingers.  Tingling or numbness in your hand.  An aching feeling in your entire arm, especially when your wrist and elbow are bent for long periods of time.  Wrist pain that goes up your arm to your shoulder.  Pain that goes down into your palm or fingers.  A weak feeling in your hands. You may have trouble grabbing and holding items.  Your symptoms may feel worse during the night. How is this  diagnosed? This condition is diagnosed with a medical history and physical exam. You may also have tests, including:  An electromyogram (EMG). This test measures electrical signals sent by your nerves into the muscles.  X-rays.  How is this treated? Treatment for this condition includes:  Lifestyle changes. It is important to stop doing or modify the activity that caused your condition.  Physical or occupational therapy.  Medicines for pain and inflammation. This may include medicine that is injected into your wrist.  A wrist splint.  Surgery.  Follow these instructions at home: If you have a splint:  Wear it as told by your health care provider. Remove it only as told by your health care provider.  Loosen the splint if your fingers become numb and tingle, or if they turn cold and blue.  Keep the splint clean and dry. General instructions  Take over-the-counter and prescription medicines only as told by your health care provider.  Rest your wrist from any activity that may be causing your pain. If your condition is work related, talk to your employer about changes that can be made, such as getting a wrist pad to use while typing.  If directed, apply ice to the painful area: ? Put ice in a plastic bag. ? Place a towel between your skin and the bag. ? Leave the ice on for 20 minutes, 2-3 times per day.  Keep all follow-up visits as told by your health care provider. This is important.  Do any exercises as told by your health care provider, physical therapist, or occupational therapist. Contact a health care provider if:  You have new symptoms.  Your pain is not controlled with medicines.  Your symptoms get worse. This information is not intended to replace advice given to you by your health care provider. Make sure you discuss any questions you have with your health care provider. Document Released: 02/06/2000 Document Revised: 06/19/2015 Document Reviewed:  06/26/2014 Elsevier Interactive Patient Education  2018 [place].  Wrist Splint A wrist splint holds your wrist in a set position so that it does not move. A wrist splint supports your wrist but is flexible. It can be removed or loosened. You may need a wrist splint if you hurt your wrist or have swelling in your wrist. A wrist splint can:  Support your wrist.  Protect a wrist injury.  Prevent another wrist injury.  Keep your wrist from moving.  Reduce pain.  Help your wrist heal.  What are the risks? If you wear your splint too tight or have a lot of swelling, it can reduce the blood supply to your wrist or hand. This can cause a condition called compartment syndrome. It can be dangerous and cause lasting damage. Symptoms are:  Pain in your wrist that is getting worse.  Tingling and numbness.  Changes in skin color (paleness or a bluish color).  Cold fingers.  Other risks of wearing a splint may be:  Skin irritation that can cause: ? Itching. ? Rash. ? Skin sores. ? Skin infection.  Wrist stiffness. This can happen if you have worn a splint for a long time.  How to use your wrist splint Your wrist splint should be tight enough to support your wrist. It should not cut off your blood supply. Your doctor will tell you how to wear your wrist splint and for how long.  Follow all your doctor's directions.  Take medicine only as told by your doctor.  Keep your wrist above the level of your heart (elevated) while resting.  Ice may help reduce pain and swelling. ? Place ice in a plastic bag. ? Place a towel between your splint and the bag. ? Leave the ice on for 20 minutes, 2-3 times a day.  Do not get your splint wet.  Do not push objects under your splint to scratch your skin.  Loosen your splint if it feels too tight. Talk to your doctor if you have questions about how tight to wear the splint.  Keep all follow-up visits as told by your doctor. This is  important.  Get help if:  You have wrist pain or swelling that does not go away.  The skin around or under your splint becomes red, itchy, or moist.  You have chills or fever.  Your splint feels too tight or too loose.  Your splint breaks. Get help right away if: You have:  Pain in your wrist that is getting worse.  Tingling and numbness.  Changes in skin color (paleness or a bluish color).  Cold fingers.  This information is not intended to replace advice given to you by your health care provider. Make sure you discuss any questions you have with your health care provider. Document Released: 07/28/2007 Document Revised: 07/17/2015 Document Reviewed: 05/22/2013 Elsevier Interactive Patient Education  2018 [employer].

## 2017-04-20 ENCOUNTER — Other Ambulatory Visit: Payer: Self-pay | Admitting: Internal Medicine

## 2017-04-28 ENCOUNTER — Encounter: Payer: Self-pay | Admitting: Podiatry

## 2017-04-28 ENCOUNTER — Ambulatory Visit: Payer: Medicare Other | Admitting: Podiatry

## 2017-04-28 VITALS — BP 113/55 | HR 74 | Ht 64.0 in | Wt 246.0 lb

## 2017-04-28 DIAGNOSIS — M79672 Pain in left foot: Secondary | ICD-10-CM | POA: Diagnosis not present

## 2017-04-28 DIAGNOSIS — L600 Ingrowing nail: Secondary | ICD-10-CM | POA: Diagnosis not present

## 2017-04-28 NOTE — Progress Notes (Signed)
SUBJECTIVE: 67 y.o. year old female presents stating that she has off and on pain on left great toe. Blood sugar yesterday was 125. Been diabetic x 5 years. She does regular water exercise.  Review of Systems  Constitutional: Negative.   HENT: Negative.   Eyes: Negative.   Respiratory: Negative.   Cardiovascular: Negative.   Gastrointestinal: Negative.   Musculoskeletal: Positive for back pain and joint pain.       Joints in left arm and hand, fingers hurt with tingling feeling. Wrist band helps.  Skin: Negative.      OBJECTIVE: DERMATOLOGIC EXAMINATION: Ingrown left hallucal nail with inflamed ungual labia both borders.  VASCULAR EXAMINATION OF LOWER LIMBS: All pedal pulses are palpable with normal pulsation.  Capillary Filling times within 3 seconds in all digits.  No edema or erythema noted. Temperature gradient from tibial crest to dorsum of foot is within normal bilateral.  NEUROLOGIC EXAMINATION OF THE LOWER LIMBS: Achilles DTR is present and within normal. Monofilament (Semmes-Weinstein 10-gm) sensory testing positive 6 out of 6, bilateral. Vibratory sensations(128Hz  turning fork) intact at medial and lateral forefoot bilateral.  Sharp and Dull discriminatory sensations at the plantar ball of hallux is intact bilateral.   MUSCULOSKELETAL EXAMINATION: No growth deformities noted.  ASSESSMENT: Painful ingrown left great toe both borders with inflamed border.  PLAN: Reviewed findings and available treatment options. Offending borders resected. Underlying hard callused tissue debrided from nail border. Return in 3 month for routine foot care or patient will return if she need permanent procedure done.

## 2017-04-28 NOTE — Patient Instructions (Signed)
Seen for painful ingrown nail left great toe. Offending border removed and hard callused skin debrided. Return in 3 month or sooner if needed.

## 2017-05-09 ENCOUNTER — Other Ambulatory Visit: Payer: Self-pay | Admitting: Internal Medicine

## 2017-05-23 ENCOUNTER — Other Ambulatory Visit: Payer: Self-pay | Admitting: Internal Medicine

## 2017-06-06 ENCOUNTER — Other Ambulatory Visit: Payer: Self-pay

## 2017-06-07 ENCOUNTER — Other Ambulatory Visit: Payer: Self-pay | Admitting: Internal Medicine

## 2017-07-07 ENCOUNTER — Other Ambulatory Visit: Payer: Self-pay | Admitting: Internal Medicine

## 2017-07-08 ENCOUNTER — Other Ambulatory Visit: Payer: Self-pay

## 2017-07-08 MED ORDER — TRAMADOL HCL 50 MG PO TABS: 50.0000 mg | ORAL_TABLET | Freq: Four times a day (QID) | ORAL | 0 refills | Status: DC | PRN

## 2017-07-18 ENCOUNTER — Other Ambulatory Visit: Payer: Self-pay | Admitting: Internal Medicine

## 2017-08-04 ENCOUNTER — Ambulatory Visit: Payer: Medicare Other | Admitting: Podiatry

## 2017-08-04 ENCOUNTER — Encounter: Payer: Self-pay | Admitting: Podiatry

## 2017-08-04 DIAGNOSIS — B351 Tinea unguium: Secondary | ICD-10-CM

## 2017-08-04 DIAGNOSIS — M79671 Pain in right foot: Secondary | ICD-10-CM

## 2017-08-04 DIAGNOSIS — L600 Ingrowing nail: Secondary | ICD-10-CM | POA: Diagnosis not present

## 2017-08-04 DIAGNOSIS — M79672 Pain in left foot: Secondary | ICD-10-CM | POA: Diagnosis not present

## 2017-08-04 NOTE — Patient Instructions (Signed)
Seen for hypertrophic and ingrown nails. All nails debrided. Return in 3 months or sooner if needed.

## 2017-08-04 NOTE — Progress Notes (Signed)
Subjective: 67 y.o. year old female patient presents complaining of painful nails. Patient requests toe nails trimmed. Since her last visit her feet were fine till last week. The toes start hurting from ingrown nails. Denies any new problems.  Objective: Dermatologic: Thick yellow deformed nails x 10. Ingrown hallucal nails and 2nd right. No open skin lesion or drainage noted. Vascular: Pedal pulses are all palpable. Orthopedic: Mild bilateral bunion. Neurologic: All epicritic and tactile sensations grossly intact.  Assessment: Dystrophic mycotic nails x 10. Ingrown hallucal nails and 2nd toe right.  Treatment: All mycotic nails debrided.  Return in 3 months or as needed.

## 2017-08-08 ENCOUNTER — Other Ambulatory Visit: Payer: Self-pay | Admitting: Internal Medicine

## 2017-08-22 ENCOUNTER — Telehealth: Payer: Self-pay

## 2017-08-22 ENCOUNTER — Encounter: Payer: Self-pay | Admitting: Family Medicine

## 2017-08-22 ENCOUNTER — Ambulatory Visit (INDEPENDENT_AMBULATORY_CARE_PROVIDER_SITE_OTHER): Payer: Medicare Other

## 2017-08-22 ENCOUNTER — Ambulatory Visit (INDEPENDENT_AMBULATORY_CARE_PROVIDER_SITE_OTHER): Payer: Medicare Other | Admitting: Family Medicine

## 2017-08-22 VITALS — BP 116/70 | HR 90 | Ht 64.5 in | Wt 250.0 lb

## 2017-08-22 DIAGNOSIS — M654 Radial styloid tenosynovitis [de Quervain]: Secondary | ICD-10-CM

## 2017-08-22 DIAGNOSIS — Z0000 Encounter for general adult medical examination without abnormal findings: Secondary | ICD-10-CM | POA: Diagnosis not present

## 2017-08-22 NOTE — Telephone Encounter (Signed)
AWV fup Check to see if maintenance meds have been ordered;

## 2017-08-22 NOTE — Progress Notes (Addendum)
Subjective:   Darlene Mclaughlin is a 67 y.o. female who presents for Medicare Annual (Subsequent) preventive examination.  Reports health as fair. Has a lot of back pain today Hx breast cancer 2016 right Last seen 2/13 Seeing Dr. Raliegh Ip next month for her physical   Has lumbar disc issues; 3 injections and last one was 2017 Takes tramadol one in [facility] and one [facility] Tylenol during the day Hx of insomnia; and now taking melatonin    Diet BMI 42  Obesity  Chol/hdl 4; hdl 50  IBG A1c 6.2  BS are running 120 to 125  Eat something for breakfast; oatmeal; cream of wheat Lunch; salads, seafood; chicken Frozen seafood  Dinner the same    Ex[facility]  4 days a week x 60  Minutes Starts 30 minutes prior to class   Health Maintenance Due  Topic Date Due  . OPHTHALMOLOGY EXAM  10/30/2016   Eye exam 10/2015 - has been completed in Feb of this year [facility]   Colonoscopy 07/2016  Repeat 2023  Mammogram 10/2016 -[facility]  Dexa 03/2015 - Cancer doctor ordered -1.6    Educated regarding ths shingrix        Objective:     Vitals: There were no vitals taken for this visit.  There is no height or weight on file to calculate BMI.  Advanced Directives 03/03/2017 08/30/2016 08/04/2016 03/01/2016 08/28/2015 05/08/2015 02/26/2015  Does Patient Have a Medical Advance Directive? Yes Yes Yes Yes Yes Yes Yes  Type of Advance Directive - [place];Living will - - - - -  Does patient want to make changes to medical advance directive? - - - - - - -  Copy of [place] in Chart? - No - copy requested - - No - copy requested No - copy requested No - copy requested    Tobacco Social History   Tobacco Use  Smoking Status Never Smoker  Smokeless Tobacco Never Used     Counseling given: Not Answered   Clinical Intake:      Past Medical History:  Diagnosis Date  . ALLERGIC  RHINITIS 05/15/2008  . Anemia   . ASTHMA 05/15/2008  . Blood transfusion without reported diagnosis 1991   anemia  . Breast cancer ([place]) 2016   right breast  . 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 Diabetes mellitus without complication ([place])   . GERD (gastroesophageal reflux disease)   . Headache(784.0) 05/15/2008  . HYPERTENSION 05/15/2008  . IMPAIRED GLUCOSE TOLERANCE 05/15/2008  . Obesity   . OSTEOARTHRITIS 05/15/2008   back   Past Surgical History:  Procedure Laterality Date  . ABDOMINAL HYSTERECTOMY    . BREAST LUMPECTOMY WITH RADIOACTIVE SEED LOCALIZATION Right 12/17/2014   Procedure: RIGHT BREAST RADIOACTIVE SEED GUIDED PARTIAL MASTECTOMY;  Surgeon: Erroll Luna, MD;  Location: [facility];  Service: General;  Laterality: Right;  . CESAREAN SECTION    . KNEE SURGERY     arthroscopic left   Family History  Problem Relation Age of Onset  . Diabetes Mother   . Hypertension Mother   . Breast cancer Mother 62       Breast cancer twice   . Hyperlipidemia Father   . Colon cancer Brother        4 th brother  . Diabetes Brother   .  Pancreatic cancer Brother        middle brother  . Stomach cancer Neg Hx    Social History   Socioeconomic History  . Marital status: Widowed    Spouse name: Not on file  . Number of children: Not on file  . Years of education: Not on file  . Highest education level: Not on file  Occupational History  . Occupation: Retired  Scientific laboratory technician  . Financial resource strain: Not on file  . Food insecurity:    Worry: Not on file    Inability: Not on file  . Transportation needs:    Medical: Not on file    Non-medical: Not on file  Tobacco Use  . Smoking status: Never Smoker  . Smokeless tobacco: Never Used  Substance and Sexual Activity  . Alcohol use: Yes    Comment: RARELY  . Drug use: No  . Sexual activity: Not on file  Lifestyle  . Physical activity:    Days per week: Not on file    Minutes per session: Not on file    . Stress: Not on file  Relationships  . Social connections:    Talks on phone: Not on file    Gets together: Not on file    Attends religious service: Not on file    Active member of club or organization: Not on file    Attends meetings of clubs or organizations: Not on file    Relationship status: Not on file  Other Topics Concern  . Not on file  Social History Narrative  . Not on file    Outpatient Encounter Medications as of 08/22/2017  Medication Sig  . acetaminophen (TYLENOL) 650 MG CR tablet Take 650 mg by mouth every 8 (eight) hours as needed.    Marland Kitchen anastrozole (ARIMIDEX) 1 MG tablet Take 1 tablet (1 mg total) by mouth daily.  Marland Kitchen aspirin 81 MG tablet Take 81 mg by mouth daily.    Marland Kitchen CALCIUM PO Take 1,000 mg by mouth daily.  . chlorthalidone (HYGROTON) 25 MG tablet TAKE 1 TABLET (25 MG TOTAL) BY MOUTH DAILY.  Marland Kitchen EPIPEN 2-PAK 0.3 MG/0.3ML SOAJ injection 0.3 mg. Reported on 05/01/2015  . fluticasone (FLONASE) 50 MCG/ACT nasal spray Place 1 spray into both nostrils daily.  Marland Kitchen glucose blood (FREESTYLE LITE) test strip 1 each by Other route 2 (two) times daily. Use as instructed  . KLOR-CON M20 20 MEQ tablet TAKE 1 TABLET (20 MEQ TOTAL) BY MOUTH 2 (TWO) TIMES DAILY.  . Melatonin 10 MG TABS Take 1 tablet by mouth at bedtime.  . metFORMIN (GLUCOPHAGE-XR) 500 MG 24 hr tablet TAKE 2 TABLETS (1,000 MG TOTAL) BY MOUTH DAILY WITH SUPPER.  . Multiple Vitamin (MULTIVITAMIN) tablet Take 1 tablet by mouth daily.    . NON FORMULARY WEEKLY ALLERGY INJECTIONS   . pantoprazole (PROTONIX) 40 MG tablet TAKE 1 TABLET (40 MG TOTAL) BY MOUTH DAILY.  Marland Kitchen PROAIR HFA 108 (90 BASE) MCG/ACT inhaler Inhale 1 puff into the lungs every 6 (six) hours as needed.   . simvastatin (ZOCOR) 20 MG tablet Take 1 tablet (20 mg total) by mouth at bedtime.  . traMADol (ULTRAM) 50 MG tablet TAKE 1 TABLET BY MOUTH EVERY 6 HOURS AS NEEDED   Facility-Administered Encounter Medications as of 08/22/2017  Medication  . 0.9 %  sodium  chloride infusion    Activities of Daily Living No flowsheet data found.  Patient Care Team: Marletta Lor, MD as PCP - [facility],  [name], MD as Consulting Physician (General Surgery) Arloa Koh, MD as Consulting Physician ([place]) Truitt Merle, MD as Consulting Physician (Hematology) Sylvan Cheese, NP as Nurse Practitioner (Hematology and Oncology)    Assessment:   This is a routine wellness examination for Darlene Mclaughlin.  Exercise Activities and Dietary recommendations    Goals    None      Fall Risk Fall Risk  12/31/2014 12/04/2014 07/16/2014 03/19/2014 12/19/2012  Falls in the past year? Yes Yes Yes No No  Number falls in past yr: 1 2 or more 1 - -  Injury with Fall? No No No - -  Risk Factor Category  - High Fall Risk - - -  Comment - gait - - -  Risk for fall due to : - - Impaired balance/gait - -     Depression Screen PHQ 2/9 Scores 12/31/2014 12/04/2014 07/16/2014 03/19/2014  PHQ - 2 Score 0 0 1 1     Cognitive Function     Ad8 score reviewed for issues:  Issues making decisions:  Less interest in hobbies / activities:  Repeats questions, stories (family complaining):  Trouble using ordinary gadgets (microwave, computer, phone):  Forgets the month or year:   Mismanaging finances:   Remembering appts:  Daily problems with thinking and/or memory: Ad8 score is=        Immunization History  Administered Date(s) Administered  . Influenza Whole 12/21/2011  . Influenza,inj,Quad PF,6+ Mos 12/19/2012, 12/05/2013, 11/26/2014  . Influenza-Unspecified 11/01/2016  . PPD Test 10/11/2011  . Pneumococcal Conjugate-13 12/05/2013  . Pneumococcal Polysaccharide-23 01/29/2015  . Tdap 06/04/2013    Screening Tests Health Maintenance  Topic Date Due  . OPHTHALMOLOGY EXAM  10/30/2016  . INFLUENZA VACCINE  09/22/2017  . FOOT EXAM  09/24/2017  . URINE MICROALBUMIN  09/24/2017  . HEMOGLOBIN A1C  10/04/2017  . MAMMOGRAM   11/11/2017  . COLONOSCOPY  08/04/2021  . TETANUS/TDAP  06/05/2023  . DEXA SCAN  Completed  . Hepatitis C Screening  Completed  . PNA vac Low Risk Adult  Completed         Plan:      PCP Notes   Health Maintenance  Eye exam 10/2015 - has been completed in Feb of this year [facility]   Colonoscopy 07/2016  Repeat 2023  Mammogram 10/2016 -[facility]  Dexa 03/2015 - Cancer doctor ordered -1.6    Educated regarding ths shingrix       Abnormal Screens  none  Referrals    Patient concerns; Right thumb back to distal wrist is hurting; was shooting pain but now is described as an ache x 1  Month; can't use or move her thumb; Agreed to be evaluated today  Apt with Dr. Sarajane Jews   Nurse Concerns; Please call Manuela Schwartz with the prescription that the pharmacy gave your for chlorthalidone ( ? Thiazide)  You can leave Manuela Schwartz a message at [phone].  Did not see where this was approved by Dr. Raliegh Ip   Next PCP apt 08/27 with Dr. Raliegh Ip        I have personally reviewed and noted the following in the patient's chart:   . Medical and social history . Use of alcohol, tobacco or illicit drugs  . Current medications and supplements . Functional ability and status . Nutritional status . Physical activity . Advanced directives . List of other physicians . Hospitalizations, surgeries, and ER visits in previous 12 months . Vitals .  Screenings to include cognitive, depression, and falls . Referrals and appointments  In addition, I have reviewed and discussed with patient certain preventive protocols, quality metrics, and best practice recommendations. A written personalized care plan for preventive services as well as general preventive health recommendations were provided to patient.     XKPVV,ZSMOL, RN  08/22/2017  Subsequent Medicare wellness visit reviewed and agree with findings.  We will see in follow-up next month as scheduled.  Marletta Lor

## 2017-08-22 NOTE — Telephone Encounter (Signed)
Noted . Please advise .

## 2017-08-22 NOTE — Patient Instructions (Addendum)
Ms. Darlene Mclaughlin , Thank you for taking time to come for your Medicare Wellness Visit. I appreciate your ongoing commitment to your health goals. Please review the following plan we discussed and let me know if I can assist you in the future.   Please call Manuela Schwartz with the prescription that the pharmacy gave your for chlorthalidone  You can leave Manuela Schwartz a message at [phone].   To see Dr. Sarajane Jews today for pain and swelling in right thumb x 1 month  Shingrix is a vaccine for the prevention of Shingles in Adults 50 and older.  If you are on Medicare, the shingrix is covered under your Part D plan, so you will take both of the vaccines in the series at your pharmacy. Please check with your benefits regarding applicable copays or out of pocket expenses.  The Shingrix is given in 2 vaccines approx 8 weeks apart. You must receive the 2nd dose prior to 6 months from receipt of the first. Please have the pharmacist print out you Immunization  dates for our office records   Dr. Burr Medico has ordered your DEXA; apt is in July  You can have this dexa completed anytime since your last one was 03/2015      These are the goals we discussed: Goals    . Exercise 150 min/wk Moderate Activity     Keep doing water aerobics !!!       This is a list of the screening recommended for you and due dates:  Health Maintenance  Topic Date Due  . Flu Shot  09/22/2017  . Complete foot exam   09/24/2017  . Urine Protein Check  09/24/2017  . Hemoglobin A1C  10/04/2017  . Mammogram  11/11/2017  . Eye exam for diabetics  03/25/2018  . Colon Cancer Screening  08/04/2021  . Tetanus Vaccine  06/05/2023  . DEXA scan (bone density measurement)  Completed  .  Hepatitis C: One time screening is recommended by Center for Disease Control  (CDC) for  adults born from [birth year] through 1965.   Completed  . Pneumonia vaccines  Completed    Health Maintenance for Postmenopausal Women Menopause is a normal process in which your  reproductive ability comes to an end. This process happens gradually over a span of months to years, usually between the ages of 32 and 47. Menopause is complete when you have missed 12 consecutive menstrual periods. It is important to talk with your health care provider about some of the most common conditions that affect postmenopausal women, such as heart disease, cancer, and bone loss (osteoporosis). Adopting a healthy lifestyle and getting preventive care can help to promote your health and wellness. Those actions can also lower your chances of developing some of these common conditions. What should I know about menopause? During menopause, you may experience a number of symptoms, such as:  Moderate-to-severe hot flashes.  Night sweats.  Decrease in sex drive.  Mood swings.  Headaches.  Tiredness.  Irritability.  Memory problems.  Insomnia.  Choosing to treat or not to treat menopausal changes is an individual decision that you make with your health care provider. What should I know about hormone replacement therapy and supplements? Hormone therapy products are effective for treating symptoms that are associated with menopause, such as hot flashes and night sweats. Hormone replacement carries certain risks, especially as you become older. If you are thinking about using estrogen or estrogen with progestin treatments, discuss the benefits and risks with your health  care provider. What should I know about heart disease and stroke? Heart disease, heart attack, and stroke become more likely as you age. This may be due, in part, to the hormonal changes that your body experiences during menopause. These can affect how your body processes dietary fats, triglycerides, and cholesterol. Heart attack and stroke are both medical emergencies. There are many things that you can do to help prevent heart disease and stroke:  Have your blood pressure checked at least every 1-2 years. High blood  pressure causes heart disease and increases the risk of stroke.  If you are 72-76 years old, ask your health care provider if you should take aspirin to prevent a heart attack or a stroke.  Do not use any tobacco products, including cigarettes, chewing tobacco, or electronic cigarettes. If you need help quitting, ask your health care provider.  It is important to eat a healthy diet and maintain a healthy weight. ? Be sure to include plenty of vegetables, fruits, low-fat dairy products, and lean protein. ? Avoid eating foods that are high in solid fats, added sugars, or salt (sodium).  Get regular exercise. This is one of the most important things that you can do for your health. ? Try to exercise for at least 150 minutes each week. The type of exercise that you do should increase your heart rate and make you sweat. This is known as moderate-intensity exercise. ? Try to do strengthening exercises at least twice each week. Do these in addition to the moderate-intensity exercise.  Know your numbers.Ask your health care provider to check your cholesterol and your blood glucose. Continue to have your blood tested as directed by your health care provider.  What should I know about cancer screening? There are several types of cancer. Take the following steps to reduce your risk and to catch any cancer development as early as possible. Breast Cancer  Practice breast self-awareness. ? This means understanding how your breasts normally appear and feel. ? It also means doing regular breast self-exams. Let your health care provider know about any changes, no matter how small.  If you are 9 or older, have a clinician do a breast exam (clinical breast exam or CBE) every year. Depending on your age, family history, and medical history, it may be recommended that you also have a yearly breast X-ray (mammogram).  If you have a family history of breast cancer, talk with your health care provider about  genetic screening.  If you are at high risk for breast cancer, talk with your health care provider about having an MRI and a mammogram every year.  Breast cancer (BRCA) gene test is recommended for women who have family members with BRCA-related cancers. Results of the assessment will determine the need for genetic counseling and BRCA1 and for BRCA2 testing. BRCA-related cancers include these types: ? Breast. This occurs in males or females. ? Ovarian. ? Tubal. This may also be called fallopian tube cancer. ? Cancer of the abdominal or pelvic lining (peritoneal cancer). ? Prostate. ? Pancreatic.  Cervical, Uterine, and Ovarian Cancer Your health care provider may recommend that you be screened regularly for cancer of the pelvic organs. These include your ovaries, uterus, and vagina. This screening involves a pelvic exam, which includes checking for microscopic changes to the surface of your cervix (Pap test).  For women ages 21-65, health care providers may recommend a pelvic exam and a Pap test every three years. For women ages 18-65, they may recommend the  Pap test and pelvic exam, combined with testing for human papilloma virus (HPV), every five years. Some types of HPV increase your risk of cervical cancer. Testing for HPV may also be done on women of any age who have unclear Pap test results.  Other health care providers may not recommend any screening for nonpregnant women who are considered low risk for pelvic cancer and have no symptoms. Ask your health care provider if a screening pelvic exam is right for you.  If you have had past treatment for cervical cancer or a condition that could lead to cancer, you need Pap tests and screening for cancer for at least 20 years after your treatment. If Pap tests have been discontinued for you, your risk factors (such as having a new sexual partner) need to be reassessed to determine if you should start having screenings again. Some women have  medical problems that increase the chance of getting cervical cancer. In these cases, your health care provider may recommend that you have screening and Pap tests more often.  If you have a family history of uterine cancer or ovarian cancer, talk with your health care provider about genetic screening.  If you have vaginal bleeding after reaching menopause, tell your health care provider.  There are currently no reliable tests available to screen for ovarian cancer.  Lung Cancer Lung cancer screening is recommended for adults 25-102 years old who are at high risk for lung cancer because of a history of smoking. A yearly low-dose CT scan of the lungs is recommended if you:  Currently smoke.  Have a history of at least 30 pack-years of smoking and you currently smoke or have quit within the past 15 years. A pack-year is smoking an average of one pack of cigarettes per day for one year.  Yearly screening should:  Continue until it has been 15 years since you quit.  Stop if you develop a health problem that would prevent you from having lung cancer treatment.  Colorectal Cancer  This type of cancer can be detected and can often be prevented.  Routine colorectal cancer screening usually begins at age 45 and continues through age 73.  If you have risk factors for colon cancer, your health care provider may recommend that you be screened at an earlier age.  If you have a family history of colorectal cancer, talk with your health care provider about genetic screening.  Your health care provider may also recommend using home test kits to check for hidden blood in your stool.  A small camera at the end of a tube can be used to examine your colon directly (sigmoidoscopy or colonoscopy). This is done to check for the earliest forms of colorectal cancer.  Direct examination of the colon should be repeated every 5-10 years until age 64. However, if early forms of precancerous polyps or small  growths are found or if you have a family history or genetic risk for colorectal cancer, you may need to be screened more often.  Skin Cancer  Check your skin from head to toe regularly.  Monitor any moles. Be sure to tell your health care provider: ? About any new moles or changes in moles, especially if there is a change in a mole's shape or color. ? If you have a mole that is larger than the size of a pencil eraser.  If any of your family members has a history of skin cancer, especially at a young age, talk with your health  care provider about genetic screening.  Always use sunscreen. Apply sunscreen liberally and repeatedly throughout the day.  Whenever you are outside, protect yourself by wearing long sleeves, pants, a wide-brimmed hat, and sunglasses.  What should I know about osteoporosis? Osteoporosis is a condition in which bone destruction happens more quickly than new bone creation. After menopause, you may be at an increased risk for osteoporosis. To help prevent osteoporosis or the bone fractures that can happen because of osteoporosis, the following is recommended:  If you are 12-68 years old, get at least 1,000 mg of calcium and at least 600 mg of vitamin D per day.  If you are older than age 83 but younger than age 5, get at least 1,200 mg of calcium and at least 600 mg of vitamin D per day.  If you are older than age 35, get at least 1,200 mg of calcium and at least 800 mg of vitamin D per day.  Smoking and excessive alcohol intake increase the risk of osteoporosis. Eat foods that are rich in calcium and vitamin D, and do weight-bearing exercises several times each week as directed by your health care provider. What should I know about how menopause affects my mental health? Depression may occur at any age, but it is more common as you become older. Common symptoms of depression include:  Low or sad mood.  Changes in sleep patterns.  Changes in appetite or eating  patterns.  Feeling an overall lack of motivation or enjoyment of activities that you previously enjoyed.  Frequent crying spells.  Talk with your health care provider if you think that you are experiencing depression. What should I know about immunizations? It is important that you get and maintain your immunizations. These include:  Tetanus, diphtheria, and pertussis (Tdap) booster vaccine.  Influenza every year before the flu season begins.  Pneumonia vaccine.  Shingles vaccine.  Your health care provider may also recommend other immunizations. This information is not intended to replace advice given to you by your health care provider. Make sure you discuss any questions you have with your health care provider. Document Released: 04/02/2005 Document Revised: 08/29/2015 Document Reviewed: 11/12/2014 Elsevier Interactive Patient Education  2018 [place] in the [place] Falls can cause injuries and can affect people from all age groups. There are many simple things that you can do to make your home safe and to help prevent falls. What can I do on the outside of my home?  Regularly repair the edges of walkways and driveways and fix any cracks.  Remove high doorway thresholds.  Trim any shrubbery on the main path into your home.  Use bright outdoor lighting.  Clear walkways of debris and clutter, including tools and rocks.  Regularly check that handrails are securely fastened and in good repair. Both sides of any steps should have handrails.  Install guardrails along the edges of any raised decks or porches.  Have leaves, snow, and ice cleared regularly.  Use sand or salt on walkways during winter months.  In the garage, clean up any spills right away, including grease or oil spills. What can I do in the bathroom?  Use night lights.  Install grab bars by the toilet and in the tub and shower. Do not use towel bars as grab bars.  Use non-skid  mats or decals on the floor of the tub or shower.  If you need to sit down while you are in the shower, use  a plastic, non-slip stool.  Keep the floor dry. Immediately clean up any water that spills on the floor.  Remove soap buildup in the tub or shower on a regular basis.  Attach bath mats securely with double-sided non-slip rug tape.  Remove throw rugs and other tripping hazards from the floor. What can I do in the bedroom?  Use night lights.  Make sure that a bedside light is easy to reach.  Do not use oversized bedding that drapes onto the floor.  Have a firm chair that has side arms to use for getting dressed.  Remove throw rugs and other tripping hazards from the floor. What can I do in the kitchen?  Clean up any spills right away.  Avoid walking on wet floors.  Place frequently used items in easy-to-reach places.  If you need to reach for something above you, use a sturdy step stool that has a grab bar.  Keep electrical cables out of the way.  Do not use floor polish or wax that makes floors slippery. If you have to use wax, make sure that it is non-skid floor wax.  Remove throw rugs and other tripping hazards from the floor. What can I do in the stairways?  Do not leave any items on the stairs.  Make sure that there are handrails on both sides of the stairs. Fix handrails that are broken or loose. Make sure that handrails are as long as the stairways.  Check any carpeting to make sure that it is firmly attached to the stairs. Fix any carpet that is loose or worn.  Avoid having throw rugs at the top or bottom of stairways, or secure the rugs with carpet tape to prevent them from moving.  Make sure that you have a light switch at the top of the stairs and the bottom of the stairs. If you do not have them, have them installed. What are some other fall prevention tips?  Wear closed-toe shoes that fit well and support your feet. Wear shoes that have rubber soles  or low heels.  When you use a stepladder, make sure that it is completely opened and that the sides are firmly locked. Have someone hold the ladder while you are using it. Do not climb a closed stepladder.  Add color or contrast paint or tape to grab bars and handrails in your home. Place contrasting color strips on the first and last steps.  Use mobility aids as needed, such as canes, walkers, scooters, and crutches.  Turn on lights if it is dark. Replace any light bulbs that burn out.  Set up furniture so that there are clear paths. Keep the furniture in the same spot.  Fix any uneven floor surfaces.  Choose a carpet design that does not hide the edge of steps of a stairway.  Be aware of any and all pets.  Review your medicines with your healthcare provider. Some medicines can cause dizziness or changes in blood pressure, which increase your risk of falling. Talk with your health care provider about other ways that you can decrease your risk of falls. This may include working with a physical therapist or trainer to improve your strength, balance, and endurance. This information is not intended to replace advice given to you by your health care provider. Make sure you discuss any questions you have with your health care provider. Document Released: 01/29/2002 Document Revised: 07/08/2015 Document Reviewed: 03/15/2014 Elsevier Interactive Patient Education  [employer].

## 2017-08-22 NOTE — Progress Notes (Signed)
   Subjective:    Patient ID: Darlene Mclaughlin, female    DOB: Jul 20, 1950, 67 y.o.   MRN: [id number]  HPI Here for one month of swelling and pain around the right thumb. No hx of trauma. She has never had this before.   Review of Systems  Constitutional: Negative.   Respiratory: Negative.   Cardiovascular: Negative.   Musculoskeletal: Positive for arthralgias and joint swelling.       Objective:   Physical Exam  Constitutional: She appears well-developed and well-nourished.  Cardiovascular: Normal rate, regular rhythm, normal heart sounds and intact distal pulses.  Pulmonary/Chest: Effort normal and breath sounds normal.  Musculoskeletal:  She is tender along the extensor surface of the right thumb base and over the radial wrist. Extension of the thumb against resistance is painful but flexion is intact. The area is mildly swollen. No warmth or erythema.           Assessment & Plan:  Tenosynovitis. Immobilize the area with a splint. Use ice packs 2-3 times daily. Take Ibuprofen 600-800 mg tid. Recheck prn. Alysia Penna, MD

## 2017-08-22 NOTE — Telephone Encounter (Signed)
07/01 call to [facility] and left a VM to request the eye report be faxed to [facility] at [place], Dr K at [phone]

## 2017-08-22 NOTE — Telephone Encounter (Signed)
Pt stated that her pharmacy recommenced for the pt to stop the chlorthalidone and start 25 MG of HCTZ which the pt has done.  Pt is wanting to know if Dr, Raliegh Ip gave the OK to switch due to medication being more affordable. If NOT pt is happy to continue the chlorthalidone because she has not had any issues with it.   Sent to [place]   Pt was seen today by Dr. Sarajane Jews

## 2017-08-23 NOTE — Telephone Encounter (Signed)
Prior note in error [facility] to fax vision report [facility] RN

## 2017-08-29 ENCOUNTER — Other Ambulatory Visit: Payer: Self-pay | Admitting: Internal Medicine

## 2017-08-30 NOTE — Progress Notes (Signed)
[facility] FOLLOW UP NOTE  Patient Care Team: Marletta Lor, MD as PCP - Rosana Hoes, MD as Consulting Physician (General Surgery) Arloa Koh, MD as Consulting Physician (Radiation Oncology) Truitt Merle, MD as Consulting Physician (Hematology) Sylvan Cheese, NP as Nurse Practitioner (Hematology and Oncology)   Date of Service:  09/01/2017   CHIEF COMPLAINTS:  Follow up right breast DCIS  . Oncology History   Breast cancer of lower-outer quadrant of right female breast [facility])   Staging form: Breast, AJCC 7th Edition     Clinical stage from 11/15/2014: Stage 0 (Tis (DCIS), N0, M0) - Signed by Truitt Merle, MD on 11/26/2014       Ductal carcinoma in situ (DCIS) of right breast   11/06/2014 Mammogram    Screening mammogram showed calcifications in the lower outer quadrant of the right breast, which was confirmed by diagnostic mammogram      11/15/2014 Initial Biopsy    Breast core needle biopsy showed ductal carcinoma in situ with calcifications.      11/15/2014 Receptors her2    ER+ (100%); PR+ (90%)      11/15/2014 Clinical Stage    Stage 0: Tis N0      12/17/2014 Surgery    Right breast lumpectomy.      12/17/2014 Pathology Results    Right breast lumpectomy showed usual ductal Hyperplasia and fibroadenoma. No residual ductal carcinoma in situ.        Radiation Therapy    Not recommended due to lack of residual malignancy at time of definitive surgery      03/28/2015 -  Anti-estrogen oral therapy    Anastrozole 1 mg daily begun 03/28/2015      05/01/2015 Survivorship    Survivorship visit completed and copy of care plan provided to patient      11/06/2015 Imaging    MM DIAG BREAST TOMO BILATERAL 11/06/15 IMPRESSION: No mammographic evidence of malignancy. BI-RADS CATEGORY  2: Benign.      08/04/2016 Pathology Results    Diagnosis  Surgical [P], cecum, polyp - TUBULAR ADENOMA. - NO HIGH GRADE DYSPLASIA OR MALIGNANCY.       08/04/2016 Procedure    Colonoscopy A 2 mm polyp was found in the cecum. The polyp was sessile. The polyp was removed with a cold snare. Resection and retrieval were complete. Findings: - The exam was otherwise without abnormality on direct and retroflexion views.      11/11/2016 Mammogram    IMPRESSION: 1. No mammographic evidence of malignancy. 2. Stable right breast postsurgical changes.       HISTORY OF PRESENTING ILLNESS:  Darlene Mclaughlin 67 y.o. female is here because of recent diagnosis of right breast DCIS.   The cancer was detected by screening mammogram. She does mammogram every year. The cancer was not palpable prior to diagnosis. She denies any other new symptoms.  She has chronic back pain, and had epidural injection twice which helped a lot. She also has chronic arthritis, especially knees and feet, she takes tramadol for both back and knee pain, she takes 0-1tab/day.  She denies any other symptoms. She has good appetite and eats well. She has good energy level. She is widowed, lives alone and very independent. She has 2 sons and 3 grandchildren, and she sees him often. In terms of breast cancer risk profile:  She menarched at early age of 27 and went to menopause at age 49 (hysterectomy)  She had 2 pregnancy and 2 children,  her first child was born at age 79 She did not breast-fed her child.  She received birth control pills for approximately 5 years  She was never exposed to fertility medications or hormone replacement therapy.  She has family history of Breast cancer (mother)   CURRENT THERAPY: anastrozole 74m daily started on 03/28/2015, held for 3 months since 09/01/17 due to joint pain.   INTERIM HISTORY: Darlene SHEDLOCKreturns for follow-up for her right breast cancer. She presents to the clinic today by herself. She notes over the past months a new onset of having carpal tunnel on her left hand and tendonitis on her right hand. She uses wrist braces and takes  Advil which helps control her pain. She notes her A1C is doing well and still takes Metformin. She notes she has been doing fasting labs. She notes to eating more healthy. She has been following up with her PCP every 3 months and will see him again next month for her annual physical.      MEDICAL HISTORY:  Past Medical History:  Diagnosis Date  . ALLERGIC RHINITIS 05/15/2008  . Anemia   . ASTHMA 05/15/2008  . Blood transfusion without reported diagnosis 1991   anemia  . Breast cancer ([facility] 2016   right breast  . Cancer ([facility]    DCIS right breast  . Diabetes mellitus without complication ([facility]   . GERD (gastroesophageal reflux disease)   . Headache(784.0) 05/15/2008  . HYPERTENSION 05/15/2008  . IMPAIRED GLUCOSE TOLERANCE 05/15/2008  . Obesity   . OSTEOARTHRITIS 05/15/2008   back    SURGICAL HISTORY: Past Surgical History:  Procedure Laterality Date  . ABDOMINAL HYSTERECTOMY    . BREAST LUMPECTOMY WITH RADIOACTIVE SEED LOCALIZATION Right 12/17/2014   Procedure: RIGHT BREAST RADIOACTIVE SEED GUIDED PARTIAL MASTECTOMY;  Surgeon: TErroll Luna MD;  [place] [facility]  Service: General;  Laterality: Right;  . CESAREAN SECTION    . KNEE SURGERY     arthroscopic left    SOCIAL HISTORY: Social History   Socioeconomic History  . Marital status: Widowed    Spouse name: Not on file  . Number of children: Not on file  . Years of education: Not on file  . Highest education level: Not on file  Occupational History  . Occupation: Retired  SScientific laboratory technician . Financial resource strain: Not on file  . Food insecurity:    Worry: Not on file    Inability: Not on file  . Transportation needs:    Medical: Not on file    Non-medical: Not on file  Tobacco Use  . Smoking status: Never Smoker  . Smokeless tobacco: Never Used  Substance and Sexual Activity  . Alcohol use: Yes    Comment: RARELY  . Drug use: No  . Sexual activity: Not on file  Lifestyle  . Physical  activity:    Days per week: Not on file    Minutes per session: Not on file  . Stress: Not on file  Relationships  . Social connections:    Talks on phone: Not on file    Gets together: Not on file    Attends religious service: Not on file    Active member of club or organization: Not on file    Attends meetings of clubs or organizations: Not on file    Relationship status: Not on file  . Intimate partner violence:    Fear of current or ex partner: Not on file    Emotionally  abused: Not on file    Physically abused: Not on file    Forced sexual activity: Not on file  Other Topics Concern  . Not on file  Social History Narrative  . Not on file    FAMILY HISTORY: Family History  Problem Relation Age of Onset  . Diabetes Mother   . Hypertension Mother   . Breast cancer Mother 85       Breast cancer twice   . Hyperlipidemia Father   . Colon cancer Brother        4 th brother  . Diabetes Brother   . Pancreatic cancer Brother        middle brother  . Stomach cancer Neg Hx     ALLERGIES:  is allergic to macrobid [nitrofurantoin monohyd macro]; naproxen; and zanaflex [tizanidine hcl].  MEDICATIONS:  Current Outpatient Medications  Medication Sig Dispense Refill  . acetaminophen (TYLENOL) 650 MG CR tablet Take 650 mg by mouth every 8 (eight) hours as needed.      Marland Kitchen anastrozole (ARIMIDEX) 1 MG tablet Take 1 tablet (1 mg total) by mouth daily. 90 tablet 3  . aspirin 81 MG tablet Take 81 mg by mouth daily.      Marland Kitchen CALCIUM PO Take 1,000 mg by mouth daily.    . chlorthalidone (HYGROTON) 25 MG tablet TAKE 1 TABLET (25 MG TOTAL) BY MOUTH DAILY. 90 tablet 1  . EPIPEN 2-PAK 0.3 MG/0.3ML SOAJ injection 0.3 mg. Reported on 05/01/2015  1  . fluticasone (FLONASE) 50 MCG/ACT nasal spray Place 1 spray into both nostrils daily. 16 g 5  . glucose blood (FREESTYLE LITE) test strip 1 each by Other route 2 (two) times daily. Use as instructed 100 each 12  . hydrochlorothiazide (HYDRODIURIL) 25 MG  tablet TAKE 1 TABLET (25 MG TOTAL) BY MOUTH DAILY. 90 tablet 0  . KLOR-CON M20 20 MEQ tablet TAKE 1 TABLET (20 MEQ TOTAL) BY MOUTH 2 (TWO) TIMES DAILY. 90 tablet 2  . Melatonin 10 MG TABS Take 1 tablet by mouth at bedtime.    . metFORMIN (GLUCOPHAGE-XR) 500 MG 24 hr tablet TAKE 2 TABLETS (1,000 MG TOTAL) BY MOUTH DAILY WITH SUPPER. 180 tablet 3  . Multiple Vitamin (MULTIVITAMIN) tablet Take 1 tablet by mouth daily.      . NON FORMULARY WEEKLY ALLERGY INJECTIONS     . pantoprazole (PROTONIX) 40 MG tablet TAKE 1 TABLET (40 MG TOTAL) BY MOUTH DAILY. 90 tablet 2  . PROAIR HFA 108 (90 BASE) MCG/ACT inhaler Inhale 1 puff into the lungs every 6 (six) hours as needed.   0  . simvastatin (ZOCOR) 20 MG tablet Take 1 tablet (20 mg total) by mouth at bedtime. 90 tablet 3  . traMADol (ULTRAM) 50 MG tablet TAKE 1 TABLET BY MOUTH EVERY 6 HOURS AS NEEDED 60 tablet 0   Current Facility-Administered Medications  Medication Dose Route Frequency Provider Last Rate Last Dose  . 0.9 %  sodium chloride infusion  500 mL Intravenous Continuous Milus Banister, MD        REVIEW OF SYSTEMS:   Constitutional: Denies fevers, chills or abnormal night sweats (+)insomnia  Eyes: Denies blurriness of vision, double vision or watery eyes Ears, nose, mouth, throat, and face: Denies mucositis or sore throat Respiratory: Denies cough, dyspnea or wheezes Cardiovascular: Denies palpitation, chest discomfort (+) lower extremity swelling of right ankle Gastrointestinal:  Denies nausea, heartburn or change in bowel habits Skin: Denies abnormal skin rashes MSK: (+) Left hand carpal tunnel  and right hand tendonitis  Lymphatics: Denies new lymphadenopathy or easy bruising Neurological:Denies numbness, tingling or new weaknesses Behavioral/Psych: Mood is stable, no new changes  All other systems were reviewed with the patient and are negative.  PHYSICAL EXAMINATION:  ECOG PERFORMANCE STATUS: 1 - Symptomatic but completely  ambulatory  Vitals:   09/01/17 0938  BP: 128/61  Pulse: 73  Resp: 20  Temp: 99.1 F (37.3 C)  SpO2: 100%   Filed Weights   09/01/17 0938  Weight: 256 lb 14.4 oz (116.5 kg)    GENERAL:alert, no distress and comfortable SKIN: skin color, texture, turgor are normal, no rashes or significant lesions EYES: normal, conjunctiva are pink and non-injected, sclera clear OROPHARYNX:no exudate, no erythema and lips, buccal mucosa, and tongue normal  NECK: supple, thyroid normal size, non-tender, without nodularity LYMPH:  no palpable lymphadenopathy in the cervical, axillary or inguinal LUNGS: clear to auscultation and percussion with normal breathing effort HEART: regular rate & rhythm and no murmurs (+) Mild lower extremity edema of right ankle ABDOMEN:abdomen soft, non-tender and normal bowel sounds Musculoskeletal:no cyanosis of digits and no clubbing  PSYCH: alert & oriented x 3 with fluent speech NEURO: no focal motor/sensory deficits Breasts: Breast inspection showed them to be symmetrical with no nipple discharge. (+) S/p right breast lumpectomy: Right breast incision site is clean and has healed well.  Palpation of the left breast and axilla revealed no obvious mass that I could appreciate.   LABORATORY DATA:  I have reviewed the data as listed CBC Latest Ref Rng & Units 09/01/2017 03/03/2017 08/30/2016  WBC 3.9 - 10.3 K/uL 5.7 6.8 7.7  Hemoglobin 11.6 - 15.9 g/dL 12.6 13.0 12.9  Hematocrit 34.8 - 46.6 % 37.7 40.3 38.9  Platelets 145 - 400 K/uL 174 194 187   CMP Latest Ref Rng & Units 09/01/2017 03/03/2017 08/30/2016  Glucose 70 - 99 mg/dL 124(H) 118 118  BUN 8 - 23 mg/dL 11 13 16.1  Creatinine 0.44 - 1.00 mg/dL 0.73 0.75 0.7  Sodium 135 - 145 mmol/L 142 141 140  Potassium 3.5 - 5.1 mmol/L 4.0 3.4 3.4(L)  Chloride 98 - 111 mmol/L 105 102 -  CO2 22 - 32 mmol/L 28 30(H) 29  Calcium 8.9 - 10.3 mg/dL 9.7 9.7 10.0  Total Protein 6.5 - 8.1 g/dL 7.6 7.8 7.9  Total Bilirubin 0.3 - 1.2  mg/dL 0.4 0.5 0.35  Alkaline Phos 38 - 126 U/L 85 85 82  AST 15 - 41 U/L 15 16 17   ALT 0 - 44 U/L 14 15 15       PATHOLOGY REPORT Diagnosis 08/04/2016 Surgical [P], cecum, polyp - TUBULAR ADENOMA. - NO HIGH GRADE DYSPLASIA OR MALIGNANCY.  Diagnosis 11/15/2014 Breast, right, needle core biopsy, lower outer - DUCTAL CARCINOMA IN SITU WITH CALCIFICATIONS. - FIBROADENOMA. - SEE COMMENT. Results: IMMUNOHISTOCHEMICAL AND MORPHOMETRIC ANALYSIS PERFORMED MANUALLY Estrogen Receptor: 100%, POSITIVE, STRONG STAINING INTENSITY Progesterone Receptor: 90%, POSITIVE, STRONG STAINING INTENSITY  Diagnosis 12/17/2014 Breast, lumpectomy, Right USUAL DUCTAL HYPERPLASIA AND FIBROADENOMA BIOPSY SITE CHANGES NO RESIDUAL DUCTAL CARCINOMA IN SITU IS IDENTIFIED  PROCEDURES  08/04/2016 colonoscopy A 2 mm polyp was found in the cecum. The polyp was sessile. The polyp was removed with a cold snare. Resection and retrieval were complete. Findings: - The exam was otherwise without abnormality on direct and retroflexion views.   RADIOGRAPHIC STUDIES: I have personally reviewed the radiological images as listed and agreed with the findings in the report.  MM DIAG BREAST TOMO BILATERAL 11/11/16 IMPRESSION: 1.  No mammographic evidence of malignancy. 2. Stable right breast postsurgical changes.  MM DIAG BREAST TOMO BILATERAL 11/06/15 IMPRESSION: No mammographic evidence of malignancy. BI-RADS CATEGORY  2: Benign.  Mm Digital Diagnostic Unilat R 11/12/2014      IMPRESSION: Grouped punctate and amorphous calcifications confirmed within the lower outer quadrant of the right breast, 6-7 o'clock axis region, for which stereotactic biopsy is recommended.  RECOMMENDATION: Stereotactic biopsy, possibly with tomosynthesis guidance, for the right breast calcifications.  Stereotactic biopsy is scheduled for September 23rd.  I have discussed the findings and recommendations with the patient. Results were also provided  in writing at the conclusion of the visit. If applicable, a reminder letter will be sent to the patient regarding the next appointment.  BI-RADS CATEGORY  4: Suspicious.   Electronically Signed   By: Franki Cabot M.D.   On: 11/12/2014 12:09   Mm Digital Screening Bilateral 11/06/2014   ACR Breast Density Category b: There are scattered areas of fibroglandular density.  FINDINGS: In the right breast, calcifications warrant further evaluation with magnified views. In the left breast, no findings suspicious for malignancy. Images were processed with CAD.  IMPRESSION: Further evaluation is suggested for calcifications in the right breast.  RECOMMENDATION: Diagnostic mammogram of the right breast. (Code:[id number])  The patient will be contacted regarding the findings, and additional imaging will be scheduled.  BI-RADS CATEGORY  0: Incomplete. Need additional imaging evaluation and/or prior mammograms for comparison.   Electronically Signed   By: Nolon Nations M.D.   On: 11/06/2014 10:27     ASSESSMENT: 67 y.o. African-American female, with past medical history of hypertension, arthritis, chronic back pain, obesity, and the well-controlled asthma, who was found to have right breast DCIS by screening mammogram.   1. right breast DCIS, ER/PR strongly positive -I previously reviewed her surgical pathology results, which showed no residual malignant cells.The patient had early stage disease. She is cured. Any form of adjuvant treatment is for preventive  -Given her ER/PR positive disease, she started on anastrozole for chemoprevention in 03/2015, plan for total 5 years. She is tolerating well. -Continue breast cancer surveillance with annual screening mammogram, self exam and routine follow-up. -Given her recent onset of carpal tunnel and tendonitis on bilateral wrists, I recommend she holding Anastrozole for 3 months to see if this gives her relief.  -I discussed she has had significant benefit from  anastrozole in the first 3 years, so if her joint pain resolves she can either stop AI or I will switch to Tamoxifen if she prefers to proceed with treatment. She is agreeable.  -She is clinically doing well. Lab reviewed, her CBC and CMP are within normal limits. Her physical exam and her 10/2016 mammogram were unremarkable. There is no clinical concern for recurrence. -For her right ankle swelling, I recommend she use compression sock and elevate her feet when sitting. Will monitor.  -Will order bone density scan and mammogram to be completed in September 2019.  -F/u in 3 months   2. Osteopenia  -Repeated a bone density scan in February 2017 showed osteopenia, no high risk for fracture.  -She is on multiple vitamin which contains vitamin D thousand unit. I also encouraged her to take calcium 600 mg once daily.  -Today's Vitamin D level is still pending.  -Pt is due for a bone density scan in 10/2017   3. Hypertension, diabetes, arthritis -Continue to follow up with her primary care physician -Pt's BP normal at 128/61 and Glucose at 124 today (  09/01/17).   4. hypokalemia -Secondary to chlorthalidone  -Potassium normal at 4.0 today (09/01/17) -She'll continue potassium 1 tablet day, and try to eat potassium-rich foods such as bananas.  5. Obesity  -Continued to encourage exercise and diet. -Pt had previously lost some weight with exercise, but has gained some weight back recently.   6. Insomnia  - she would like to try Melatonin -Not mentioned in today's visit, likely much improved or resolved.   Plan -Hold anastrozole for next 3 months starting today due to her wrists pain  -Mammogram and [facility] at [facility] in 10/2017  -F/u in 3 months     l questions were answered. The patient knows to call the clinic with any problems, questions or concerns.  I spent 20 minutes counseling the patient face to face. The total time spent in the appointment was 25 minutes and more than 50% was on  counseling.     Joslyn Devon 09/01/2017   Oneal Deputy, am acting as scribe for Truitt Merle, MD.   I have reviewed the above documentation for accuracy and completeness, and I agree with the above.

## 2017-08-31 ENCOUNTER — Other Ambulatory Visit: Payer: Self-pay | Admitting: Hematology

## 2017-08-31 DIAGNOSIS — M8580 Other specified disorders of bone density and structure, unspecified site: Secondary | ICD-10-CM

## 2017-09-01 ENCOUNTER — Other Ambulatory Visit: Payer: Self-pay | Admitting: Internal Medicine

## 2017-09-01 ENCOUNTER — Inpatient Hospital Stay: Payer: Medicare Other

## 2017-09-01 ENCOUNTER — Encounter: Payer: Self-pay | Admitting: Hematology

## 2017-09-01 ENCOUNTER — Telehealth: Payer: Self-pay | Admitting: Hematology

## 2017-09-01 ENCOUNTER — Inpatient Hospital Stay: Payer: Medicare Other | Attending: Hematology | Admitting: Hematology

## 2017-09-01 VITALS — BP 128/61 | HR 73 | Temp 99.1°F | Resp 20 | Wt 256.9 lb

## 2017-09-01 DIAGNOSIS — Z9011 Acquired absence of right breast and nipple: Secondary | ICD-10-CM | POA: Insufficient documentation

## 2017-09-01 DIAGNOSIS — I10 Essential (primary) hypertension: Secondary | ICD-10-CM | POA: Insufficient documentation

## 2017-09-01 DIAGNOSIS — Z79899 Other long term (current) drug therapy: Secondary | ICD-10-CM | POA: Insufficient documentation

## 2017-09-01 DIAGNOSIS — E876 Hypokalemia: Secondary | ICD-10-CM | POA: Diagnosis not present

## 2017-09-01 DIAGNOSIS — M25532 Pain in left wrist: Secondary | ICD-10-CM | POA: Insufficient documentation

## 2017-09-01 DIAGNOSIS — M8580 Other specified disorders of bone density and structure, unspecified site: Secondary | ICD-10-CM | POA: Insufficient documentation

## 2017-09-01 DIAGNOSIS — M1990 Unspecified osteoarthritis, unspecified site: Secondary | ICD-10-CM | POA: Diagnosis not present

## 2017-09-01 DIAGNOSIS — E119 Type 2 diabetes mellitus without complications: Secondary | ICD-10-CM | POA: Diagnosis not present

## 2017-09-01 DIAGNOSIS — Z6841 Body Mass Index (BMI) 40.0 and over, adult: Secondary | ICD-10-CM

## 2017-09-01 DIAGNOSIS — E669 Obesity, unspecified: Secondary | ICD-10-CM | POA: Diagnosis not present

## 2017-09-01 DIAGNOSIS — G4700 Insomnia, unspecified: Secondary | ICD-10-CM | POA: Insufficient documentation

## 2017-09-01 DIAGNOSIS — D0511 Intraductal carcinoma in situ of right breast: Secondary | ICD-10-CM

## 2017-09-01 DIAGNOSIS — Z170 Estrogen receptor positive status [ER+]: Secondary | ICD-10-CM | POA: Insufficient documentation

## 2017-09-01 DIAGNOSIS — Z79811 Long term (current) use of aromatase inhibitors: Secondary | ICD-10-CM | POA: Insufficient documentation

## 2017-09-01 LAB — COMPREHENSIVE METABOLIC PANEL
ALT: 14 U/L (ref 0–44)
AST: 15 U/L (ref 15–41)
Albumin: 4 g/dL (ref 3.5–5.0)
Alkaline Phosphatase: 85 U/L (ref 38–126)
Anion gap: 9 (ref 5–15)
BUN: 11 mg/dL (ref 8–23)
CO2: 28 mmol/L (ref 22–32)
Calcium: 9.7 mg/dL (ref 8.9–10.3)
Chloride: 105 mmol/L (ref 98–111)
Creatinine, Ser: 0.73 mg/dL (ref 0.44–1.00)
GFR calc Af Amer: >60 mL/min (ref >60)
GFR calc non Af Amer: >60 mL/min (ref >60)
Glucose, Bld: 124 mg/dL — ABNORMAL HIGH (ref 70–99)
Potassium: 4 mmol/L (ref 3.5–5.1)
Sodium: 142 mmol/L (ref 135–145)
Total Bilirubin: 0.4 mg/dL (ref 0.3–1.2)
Total Protein: 7.6 g/dL (ref 6.5–8.1)

## 2017-09-01 LAB — CBC WITH DIFFERENTIAL/PLATELET
Basophils Absolute: 0 10*3/uL (ref 0.0–0.1)
Basophils Relative: 1 %
Eosinophils Absolute: 0.2 10*3/uL (ref 0.0–0.5)
Eosinophils Relative: 3 %
HCT: 37.7 % (ref 34.8–46.6)
Hemoglobin: 12.6 g/dL (ref 11.6–15.9)
Lymphocytes Relative: 24 %
Lymphs Abs: 1.4 10*3/uL (ref 0.9–3.3)
MCH: 31 pg (ref 25.1–34.0)
MCHC: 33.3 g/dL (ref 31.5–36.0)
MCV: 93 fL (ref 79.5–101.0)
Monocytes Absolute: 0.5 10*3/uL (ref 0.1–0.9)
Monocytes Relative: 8 %
Neutro Abs: 3.7 10*3/uL (ref 1.5–6.5)
Neutrophils Relative %: 64 %
Platelets: 174 10*3/uL (ref 145–400)
RBC: 4.05 MIL/uL (ref 3.70–5.45)
RDW: 14.1 % (ref 11.2–14.5)
WBC: 5.7 10*3/uL (ref 3.9–10.3)

## 2017-09-01 NOTE — Telephone Encounter (Signed)
Scheduled appt per 7/11 los - gave patient aVS and calender per los.  

## 2017-09-02 LAB — VITAMIN D 25 HYDROXY (VIT D DEFICIENCY, FRACTURES): Vit D, 25-Hydroxy: 45.4 ng/mL (ref 30.0–100.0)

## 2017-09-09 ENCOUNTER — Other Ambulatory Visit: Payer: Self-pay | Admitting: Internal Medicine

## 2017-09-12 NOTE — Telephone Encounter (Signed)
Last fill 08/10/17 Last OV 04/06/17 Ok to fill?

## 2017-09-16 ENCOUNTER — Telehealth: Payer: Self-pay

## 2017-09-16 NOTE — Telephone Encounter (Signed)
Left voice message notifying patient per Dr. Burr Medico vitamin D level was normal.

## 2017-09-16 NOTE — Telephone Encounter (Signed)
-----   Message from Truitt Merle, MD sent at 09/16/2017  9:03 AM EDT ----- Please let pt know that her vitD level was normal on her last visit, thanks   Truitt Merle  09/16/2017

## 2017-10-10 ENCOUNTER — Other Ambulatory Visit: Payer: Self-pay | Admitting: Internal Medicine

## 2017-10-18 ENCOUNTER — Encounter: Payer: Self-pay | Admitting: Internal Medicine

## 2017-10-18 ENCOUNTER — Ambulatory Visit (INDEPENDENT_AMBULATORY_CARE_PROVIDER_SITE_OTHER): Payer: Medicare Other | Admitting: Internal Medicine

## 2017-10-18 VITALS — BP 130/66 | HR 75 | Temp 98.4°F | Ht 64.0 in | Wt 257.0 lb

## 2017-10-18 DIAGNOSIS — M150 Primary generalized (osteo)arthritis: Secondary | ICD-10-CM | POA: Diagnosis not present

## 2017-10-18 DIAGNOSIS — E119 Type 2 diabetes mellitus without complications: Secondary | ICD-10-CM

## 2017-10-18 DIAGNOSIS — M8949 Other hypertrophic osteoarthropathy, multiple sites: Secondary | ICD-10-CM

## 2017-10-18 DIAGNOSIS — Z86010 Personal history of colonic polyps: Secondary | ICD-10-CM | POA: Insufficient documentation

## 2017-10-18 DIAGNOSIS — M159 Polyosteoarthritis, unspecified: Secondary | ICD-10-CM

## 2017-10-18 DIAGNOSIS — D0511 Intraductal carcinoma in situ of right breast: Secondary | ICD-10-CM

## 2017-10-18 DIAGNOSIS — E785 Hyperlipidemia, unspecified: Secondary | ICD-10-CM

## 2017-10-18 DIAGNOSIS — Z0000 Encounter for general adult medical examination without abnormal findings: Secondary | ICD-10-CM

## 2017-10-18 DIAGNOSIS — I10 Essential (primary) hypertension: Secondary | ICD-10-CM

## 2017-10-18 LAB — TSH: TSH: 1.3 u[IU]/mL (ref 0.35–4.50)

## 2017-10-18 LAB — LIPID PANEL
Cholesterol: 141 mg/dL (ref 0–200)
HDL: 50.9 mg/dL (ref >39.00)
LDL Cholesterol: 78 mg/dL (ref 0–99)
NonHDL: 90.25
Total CHOL/HDL Ratio: 3
Triglycerides: 59 mg/dL (ref 0.0–149.0)
VLDL: 11.8 mg/dL (ref 0.0–40.0)

## 2017-10-18 LAB — HEMOGLOBIN A1C: Hgb A1c MFr Bld: 6.7 % — ABNORMAL HIGH (ref 4.6–6.5)

## 2017-10-18 MED ORDER — POTASSIUM CHLORIDE CRYS ER 20 MEQ PO TBCR: 20.0000 meq | EXTENDED_RELEASE_TABLET | Freq: Two times a day (BID) | ORAL | 6 refills | Status: DC

## 2017-10-18 MED ORDER — TRAMADOL HCL 50 MG PO TABS: 50.0000 mg | ORAL_TABLET | Freq: Four times a day (QID) | ORAL | 0 refills | Status: DC | PRN

## 2017-10-18 MED ORDER — PANTOPRAZOLE SODIUM 40 MG PO TBEC: DELAYED_RELEASE_TABLET | ORAL | 2 refills | Status: DC

## 2017-10-18 MED ORDER — ATORVASTATIN CALCIUM 10 MG PO TABS: 10.0000 mg | ORAL_TABLET | Freq: Every day | ORAL | 3 refills | Status: DC

## 2017-10-18 MED ORDER — HYDROCHLOROTHIAZIDE 25 MG PO TABS: ORAL_TABLET | ORAL | 4 refills | Status: DC

## 2017-10-18 NOTE — Progress Notes (Signed)
Subjective:    Patient ID: Darlene Mclaughlin, female    DOB: 05-Dec-1950, 67 y.o.   MRN: [id number]  HPI 67 year old patient who is seen today for a annual preventive health examination.  She was seen earlier for a subsequent Medicare wellness visit She has a history of type 2 diabetes which has been well controlled on metformin therapy.  She did have a annual diabetic eye examination in February of this year She had a colonoscopy in 2018 that did reveal an adenoma.  5-year interval recommended She is followed by oncology periodically due to history of breast cancer.  She is also followed by allergy medicine.  She has essential hypertension which has been well controlled on diuretic therapy  Past Medical History:  Diagnosis Date  . ALLERGIC RHINITIS 05/15/2008  . Anemia   . ASTHMA 05/15/2008  . Blood transfusion without reported diagnosis 1991   anemia  . Breast cancer ([place]) 2016   right breast  . 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 Diabetes mellitus without complication ([place])   . GERD (gastroesophageal reflux disease)   . Headache(784.0) 05/15/2008  . HYPERTENSION 05/15/2008  . IMPAIRED GLUCOSE TOLERANCE 05/15/2008  . Obesity   . OSTEOARTHRITIS 05/15/2008   back     Social History   Socioeconomic History  . Marital status: Widowed    Spouse name: Not on file  . Number of children: Not on file  . Years of education: Not on file  . Highest education level: Not on file  Occupational History  . Occupation: Retired  Scientific laboratory technician  . Financial resource strain: Not on file  . Food insecurity:    Worry: Not on file    Inability: Not on file  . Transportation needs:    Medical: Not on file    Non-medical: Not on file  Tobacco Use  . Smoking status: Never Smoker  . Smokeless tobacco: Never Used  Substance and Sexual Activity  . Alcohol use: Yes    Comment: RARELY  . Drug use: No  . Sexual activity: Not on file  Lifestyle  . Physical activity:    Days per week: Not on file     Minutes per session: Not on file  . Stress: Not on file  Relationships  . Social connections:    Talks on phone: Not on file    Gets together: Not on file    Attends religious service: Not on file    Active member of club or organization: Not on file    Attends meetings of clubs or organizations: Not on file    Relationship status: Not on file  . Intimate partner violence:    Fear of current or ex partner: Not on file    Emotionally abused: Not on file    Physically abused: Not on file    Forced sexual activity: Not on file  Other Topics Concern  . Not on file  Social History Narrative  . Not on file    Past Surgical History:  Procedure Laterality Date  . ABDOMINAL HYSTERECTOMY    . BREAST LUMPECTOMY WITH RADIOACTIVE SEED LOCALIZATION Right 12/17/2014   Procedure: RIGHT BREAST RADIOACTIVE SEED GUIDED PARTIAL MASTECTOMY;  Surgeon: Erroll Luna, MD;  Location: [facility];  Service: General;  Laterality: Right;  . CESAREAN SECTION    . KNEE SURGERY     arthroscopic left    Family History  Problem Relation Age of Onset  . Diabetes Mother   .  Hypertension Mother   . Breast cancer Mother 79       Breast cancer twice   . Hyperlipidemia Father   . Colon cancer Brother        4 th brother  . Diabetes Brother   . Pancreatic cancer Brother        middle brother  . Stomach cancer Neg Hx     Allergies  Allergen Reactions  . Macrobid [Nitrofurantoin Monohyd Macro]      Liver problems  . Naproxen   . Zanaflex [Tizanidine Hcl] Other (See Comments)     Liver problems    Current Outpatient Medications on File Prior to Visit  Medication Sig Dispense Refill  . acetaminophen (TYLENOL) 650 MG CR tablet Take 650 mg by mouth every 8 (eight) hours as needed.      Marland Kitchen anastrozole (ARIMIDEX) 1 MG tablet Take 1 tablet (1 mg total) by mouth daily. 90 tablet 3  . aspirin 81 MG tablet Take 81 mg by mouth daily.      Marland Kitchen CALCIUM PO Take 1,000 mg by mouth daily.    Marland Kitchen  EPIPEN 2-PAK 0.3 MG/0.3ML SOAJ injection 0.3 mg. Reported on 05/01/2015  1  . fluticasone (FLONASE) 50 MCG/ACT nasal spray Place 1 spray into both nostrils daily. 16 g 5  . glucose blood (FREESTYLE LITE) test strip 1 each by Other route 2 (two) times daily. Use as instructed 100 each 12  . hydrochlorothiazide (HYDRODIURIL) 25 MG tablet TAKE 1 TABLET (25 MG TOTAL) BY MOUTH DAILY. 90 tablet 0  . KLOR-CON M20 20 MEQ tablet TAKE 1 TABLET (20 MEQ TOTAL) BY MOUTH 2 (TWO) TIMES DAILY. 180 tablet 0  . Melatonin 10 MG TABS Take 1 tablet by mouth at bedtime.    . metFORMIN (GLUCOPHAGE-XR) 500 MG 24 hr tablet TAKE 2 TABLETS (1,000 MG TOTAL) BY MOUTH DAILY WITH SUPPER. 180 tablet 3  . Multiple Vitamin (MULTIVITAMIN) tablet Take 1 tablet by mouth daily.      . NON FORMULARY WEEKLY ALLERGY INJECTIONS     . pantoprazole (PROTONIX) 40 MG tablet TAKE 1 TABLET (40 MG TOTAL) BY MOUTH DAILY. 90 tablet 2  . PROAIR HFA 108 (90 BASE) MCG/ACT inhaler Inhale 1 puff into the lungs every 6 (six) hours as needed.   0  . traMADol (ULTRAM) 50 MG tablet TAKE 1 TABLET BY MOUTH EVERY 6 HOURS AS NEEDED 60 tablet 0  . atorvastatin (LIPITOR) 10 MG tablet Take 10 mg by mouth daily.  3  . chlorthalidone (HYGROTON) 25 MG tablet TAKE 1 TABLET (25 MG TOTAL) BY MOUTH DAILY. (Patient not taking: Reported on 10/18/2017) 90 tablet 1   Current Facility-Administered Medications on File Prior to Visit  Medication Dose Route Frequency Provider Last Rate Last Dose  . 0.9 %  sodium chloride infusion  500 mL Intravenous Continuous Milus Banister, MD        BP 130/66 (BP Location: Right Arm, Patient Position: Sitting, Cuff Size: Large)   Pulse 75   Temp 98.4 F (36.9 C) (Oral)   Ht 5\' 4"  (1.626 m)   Wt 257 lb (116.6 kg)   SpO2 97%   BMI 44.11 kg/m      Review of Systems  Constitutional: Negative.   HENT: Negative for congestion, dental problem, hearing loss, rhinorrhea, sinus pressure, sore throat and tinnitus.   Eyes: Negative for  pain, discharge and visual disturbance.  Respiratory: Negative for cough and shortness of breath.   Cardiovascular: Negative for chest pain,  palpitations and leg swelling.  Gastrointestinal: Negative for abdominal distention, abdominal pain, blood in stool, constipation, diarrhea, nausea and vomiting.  Genitourinary: Negative for difficulty urinating, dysuria, flank pain, frequency, hematuria, pelvic pain, urgency, vaginal bleeding, vaginal discharge and vaginal pain.  Musculoskeletal: Positive for back pain. Negative for arthralgias, gait problem and joint swelling.  Skin: Negative for rash.  Neurological: Negative for dizziness, syncope, speech difficulty, weakness, numbness and headaches.  Hematological: Negative for adenopathy.  Psychiatric/Behavioral: Negative for agitation, behavioral problems and dysphoric mood. The patient is not nervous/anxious.        Objective:   Physical Exam  Constitutional: She is oriented to person, place, and time. She appears well-developed and well-nourished.  Weight 257 Blood pressure 120/64  HENT:  Head: Normocephalic and atraumatic.  Right Ear: External ear normal.  Left Ear: External ear normal.  Mouth/Throat: Oropharynx is clear and moist.  Wax in both canals Pharyngeal crowding  Eyes: Conjunctivae and EOM are normal.  Neck: Normal range of motion. Neck supple. No JVD present. No thyromegaly present.  Cardiovascular: Normal rate, regular rhythm, normal heart sounds and intact distal pulses.  No murmur heard. Pulmonary/Chest: Effort normal and breath sounds normal. She has no wheezes. She has no rales.  Abdominal: Soft. Bowel sounds are normal. She exhibits no distension and no mass. There is no tenderness. There is no rebound and no guarding.  Lower midline surgical scars  Musculoskeletal: Normal range of motion. She exhibits no edema or tenderness.  Neurological: She is alert and oriented to person, place, and time. She has normal reflexes.  She displays normal reflexes. No cranial nerve deficit. She exhibits normal muscle tone. Coordination normal.  Skin: Skin is warm and dry. No rash noted.  Traumatic scar above the right knee  Psychiatric: She has a normal mood and affect. Her behavior is normal.          Assessment & Plan:   Preventive health examination Essential hypertension well-controlled Type 2 diabetes.  Will review a lipid profile as well as a hemoglobin A1c History of breast cancer.  Follow-up oncology History of asthma.  Follow-up with allergy medicine History of colonic polyp.  Follow-up colonoscopy 4 years  Patient will establish with a new provider in 4 to 6 months  Marletta Lor

## 2017-10-18 NOTE — Patient Instructions (Signed)
Limit your sodium (Salt) intake   Please check your hemoglobin A1c every 3-6  Months    It is important that you exercise regularly, at least 20 minutes 3 to 4 times per week.  If you develop chest pain or shortness of breath seek  medical attention.  Please see your eye doctor yearly to check for diabetic eye damage  You need to lose weight.  Consider a lower calorie diet and regular exercise.

## 2017-10-29 ENCOUNTER — Other Ambulatory Visit: Payer: Self-pay | Admitting: Hematology

## 2017-10-29 DIAGNOSIS — Z170 Estrogen receptor positive status [ER+]: Principal | ICD-10-CM

## 2017-10-29 DIAGNOSIS — C50511 Malignant neoplasm of lower-outer quadrant of right female breast: Secondary | ICD-10-CM

## 2017-11-08 ENCOUNTER — Ambulatory Visit: Payer: Medicare Other | Admitting: Podiatry

## 2017-11-08 ENCOUNTER — Encounter: Payer: Self-pay | Admitting: Podiatry

## 2017-11-08 DIAGNOSIS — B351 Tinea unguium: Secondary | ICD-10-CM | POA: Diagnosis not present

## 2017-11-08 DIAGNOSIS — L600 Ingrowing nail: Secondary | ICD-10-CM

## 2017-11-08 DIAGNOSIS — M79672 Pain in left foot: Secondary | ICD-10-CM

## 2017-11-08 DIAGNOSIS — M79671 Pain in right foot: Secondary | ICD-10-CM

## 2017-11-08 NOTE — Patient Instructions (Signed)
Seen for hypertrophic nails. All nails debrided. Return in 3 months or as needed.  

## 2017-11-08 NOTE — Progress Notes (Signed)
Subjective: 67 y.o. year old female patient presents complaining of painful nails. Patient requests toe nails trimmed.  Stated that her blood sugar is under control.   Objective: Dermatologic: Thick yellow deformed nails x 10. Painful ingrown hallucal nails both great toes. No active drainage or inflammation noted. Vascular: Pedal pulses are all palpable. Mild ankle edema right. Orthopedic: No gross deformities noted. Neurologic: All epicritic and tactile sensations grossly intact.  Assessment: Dystrophic mycotic nails x 10. Painful ingrown nails both great toes.  Treatment: Reviewed findings. Advised to use compression socks to control swelling in ankle. All mycotic and ingrown nails debrided. Bleeding nails cleansed with Iodine and dressing applied with home care instruction to replace for the next 2 days. Return in 3 months or as needed.

## 2017-11-30 NOTE — Progress Notes (Signed)
[facility] FOLLOW UP NOTE  Patient Care Team: Marletta Lor, MD as PCP - Rosana Hoes, MD as Consulting Physician (General Surgery) Arloa Koh, MD as Consulting Physician (Radiation Oncology) Truitt Merle, MD as Consulting Physician (Hematology) Sylvan Cheese, NP as Nurse Practitioner (Hematology and Oncology)   Date of Service:  12/01/2017   CHIEF COMPLAINTS:  Follow up right breast DCIS  . Oncology History   Breast cancer of lower-outer quadrant of right female breast [facility])   Staging form: Breast, AJCC 7th Edition     Clinical stage from 11/15/2014: Stage 0 (Tis (DCIS), N0, M0) - Signed by Truitt Merle, MD on 11/26/2014       Ductal carcinoma in situ (DCIS) of right breast   11/06/2014 Mammogram    Screening mammogram showed calcifications in the lower outer quadrant of the right breast, which was confirmed by diagnostic mammogram    11/15/2014 Initial Biopsy    Breast core needle biopsy showed ductal carcinoma in situ with calcifications.    11/15/2014 Receptors her2    ER+ (100%); PR+ (90%)    11/15/2014 Clinical Stage    Stage 0: Tis N0    12/17/2014 Surgery    Right breast lumpectomy.    12/17/2014 Pathology Results    Right breast lumpectomy showed usual ductal Hyperplasia and fibroadenoma. No residual ductal carcinoma in situ.      Radiation Therapy    Not recommended due to lack of residual malignancy at time of definitive surgery    03/28/2015 -  Anti-estrogen oral therapy    Anastrozole 1 mg daily begun 03/28/2015    05/01/2015 Survivorship    Survivorship visit completed and copy of care plan provided to patient    11/06/2015 Imaging    MM DIAG BREAST TOMO BILATERAL 11/06/15 IMPRESSION: No mammographic evidence of malignancy. BI-RADS CATEGORY  2: Benign.    08/04/2016 Pathology Results    Diagnosis  Surgical [P], cecum, polyp - TUBULAR ADENOMA. - NO HIGH GRADE DYSPLASIA OR MALIGNANCY.    08/04/2016 Procedure     Colonoscopy A 2 mm polyp was found in the cecum. The polyp was sessile. The polyp was removed with a cold snare. Resection and retrieval were complete. Findings: - The exam was otherwise without abnormality on direct and retroflexion views.    11/11/2016 Mammogram    IMPRESSION: 1. No mammographic evidence of malignancy. 2. Stable right breast postsurgical changes.     HISTORY OF PRESENTING ILLNESS:  Darlene Mclaughlin 67 y.o. female is here because of recent diagnosis of right breast DCIS.   The cancer was detected by screening mammogram. She does mammogram every year. The cancer was not palpable prior to diagnosis. She denies any other new symptoms.  She has chronic back pain, and had epidural injection twice which helped a lot. She also has chronic arthritis, especially knees and feet, she takes tramadol for both back and knee pain, she takes 0-1tab/day.  She denies any other symptoms. She has good appetite and eats well. She has good energy level. She is widowed, lives alone and very independent. She has 2 sons and 3 grandchildren, and she sees him often. In terms of breast cancer risk profile:  She menarched at early age of 66 and went to menopause at age 37 (hysterectomy)  She had 2 pregnancy and 2 children, her first child was born at age 76 She did not breast-fed her child.  She received birth control pills for approximately 5 years  She  was never exposed to fertility medications or hormone replacement therapy.  She has family history of Breast cancer (mother)   CURRENT THERAPY: anastrozole 58m daily started on 03/28/2015, held for 3 months since 09/01/17 due to joint pain.   INTERIM HISTORY: IAVIELLE IMBERTreturns for follow-up for her right breast cancer. She presents to the clinic today by herself. She is doing well. She states that her right arm tendinitis flares recently, but is over all improving.   She states that she noticed balance difficulties, and she relates that to  her back pain. She had 3 epidural injection for her back. She also noticed that she's been having difficulties finding words recently. She denies memory problems, weakness, blurry or double vision.      MEDICAL HISTORY:  Past Medical History:  Diagnosis Date  . ALLERGIC RHINITIS 05/15/2008  . Anemia   . ASTHMA 05/15/2008  . Blood transfusion without reported diagnosis 1991   anemia  . [place]   right breast  . Cancer ([facility]    DCIS right breast  . Diabetes mellitus without complication ([facility]   . GERD (gastroesophageal reflux disease)   . Headache(784.0) 05/15/2008  . HYPERTENSION 05/15/2008  . IMPAIRED GLUCOSE TOLERANCE 05/15/2008  . Obesity   . OSTEOARTHRITIS 05/15/2008   back    SURGICAL HISTORY: Past Surgical History:  Procedure Laterality Date  . ABDOMINAL HYSTERECTOMY    . BREAST LUMPECTOMY WITH RADIOACTIVE SEED LOCALIZATION Right 12/17/2014   Procedure: RIGHT BREAST RADIOACTIVE SEED GUIDED PARTIAL MASTECTOMY;  Surgeon: TErroll Luna MD;  [place] [facility]  Service: General;  Laterality: Right;  . CESAREAN SECTION    . KNEE SURGERY     arthroscopic left    SOCIAL HISTORY: Social History   Socioeconomic History  . Marital status: Widowed    Spouse name: Not on file  . Number of children: Not on file  . Years of education: Not on file  . Highest education level: Not on file  Occupational History  . Occupation: Retired  SScientific laboratory technician . Financial resource strain: Not on file  . Food insecurity:    Worry: Not on file    Inability: Not on file  . Transportation needs:    Medical: Not on file    Non-medical: Not on file  Tobacco Use  . Smoking status: Never Smoker  . Smokeless tobacco: Never Used  Substance and Sexual Activity  . Alcohol use: Yes    Comment: RARELY  . Drug use: No  . Sexual activity: Not on file  Lifestyle  . Physical activity:    Days per week: Not on file    Minutes per session: Not on file  . Stress:  Not on file  Relationships  . Social connections:    Talks on phone: Not on file    Gets together: Not on file    Attends religious service: Not on file    Active member of club or organization: Not on file    Attends meetings of clubs or organizations: Not on file    Relationship status: Not on file  . Intimate partner violence:    Fear of current or ex partner: Not on file    Emotionally abused: Not on file    Physically abused: Not on file    Forced sexual activity: Not on file  Other Topics Concern  . Not on file  Social History Narrative  . Not on file    FAMILY HISTORY: Family  History  Problem Relation Age of Onset  . Diabetes Mother   . Hypertension Mother   . Breast cancer Mother 1       Breast cancer twice   . Hyperlipidemia Father   . Colon cancer Brother        4 th brother  . Diabetes Brother   . Pancreatic cancer Brother        middle brother  . Stomach cancer Neg Hx     ALLERGIES:  is allergic to macrobid [nitrofurantoin monohyd macro]; naproxen; and zanaflex [tizanidine hcl].  MEDICATIONS:  Current Outpatient Medications  Medication Sig Dispense Refill  . acetaminophen (TYLENOL) 650 MG CR tablet Take 650 mg by mouth every 8 (eight) hours as needed.      Marland Kitchen anastrozole (ARIMIDEX) 1 MG tablet TAKE 1 TABLET BY MOUTH EVERY DAY 90 tablet 3  . aspirin 81 MG tablet Take 81 mg by mouth daily.      Marland Kitchen atorvastatin (LIPITOR) 10 MG tablet Take 1 tablet (10 mg total) by mouth daily. 90 tablet 3  . CALCIUM PO Take 1,000 mg by mouth daily.    Marland Kitchen EPIPEN 2-PAK 0.3 MG/0.3ML SOAJ injection 0.3 mg. Reported on 05/01/2015  1  . fluticasone (FLONASE) 50 MCG/ACT nasal spray Place 1 spray into both nostrils daily. 16 g 5  . glucose blood (FREESTYLE LITE) test strip 1 each by Other route 2 (two) times daily. Use as instructed 100 each 12  . hydrochlorothiazide (HYDRODIURIL) 25 MG tablet TAKE 1 TABLET (25 MG TOTAL) BY MOUTH DAILY. 90 tablet 4  . Melatonin 10 MG TABS Take 1 tablet  by mouth at bedtime.    . metFORMIN (GLUCOPHAGE-XR) 500 MG 24 hr tablet TAKE 2 TABLETS (1,000 MG TOTAL) BY MOUTH DAILY WITH SUPPER. 180 tablet 3  . Multiple Vitamin (MULTIVITAMIN) tablet Take 1 tablet by mouth daily.      . NON FORMULARY WEEKLY ALLERGY INJECTIONS     . pantoprazole (PROTONIX) 40 MG tablet TAKE 1 TABLET (40 MG TOTAL) BY MOUTH DAILY. 90 tablet 2  . potassium chloride SA (KLOR-CON M20) 20 MEQ tablet Take 1 tablet (20 mEq total) by mouth 2 (two) times daily. 180 tablet 6  . PROAIR HFA 108 (90 BASE) MCG/ACT inhaler Inhale 1 puff into the lungs every 6 (six) hours as needed.   0  . traMADol (ULTRAM) 50 MG tablet Take 1 tablet (50 mg total) by mouth every 6 (six) hours as needed. 60 tablet 0   No current facility-administered medications for this visit.     REVIEW OF SYSTEMS:   Constitutional: Denies fevers, chills or abnormal night sweats  Eyes: Denies blurriness of vision, double vision or watery eyes Ears, nose, mouth, throat, and face: Denies mucositis or sore throat Respiratory: Denies cough, dyspnea or wheezes Cardiovascular: Denies palpitation, chest discomfort, no new LL edema Gastrointestinal:  Denies nausea, heartburn or change in bowel habits Skin: Denies abnormal skin rashes MSK: (+) Left hand carpal tunnel and right hand tendonitis, improving  Lymphatics: Denies new lymphadenopathy or easy bruising Neurological:Denies numbness, tingling or new weaknesses Behavioral/Psych: Mood is stable, no new changes  All other systems were reviewed with the patient and are negative.  PHYSICAL EXAMINATION:  ECOG PERFORMANCE STATUS: 1 - Symptomatic but completely ambulatory  Vitals:   12/01/17 1133  BP: 120/63  Pulse: 70  Resp: 18  Temp: 98.3 F (36.8 C)  SpO2: 97%   Filed Weights   12/01/17 1133  Weight: 260 lb (117.9 kg)  GENERAL:alert, no distress and comfortable SKIN: skin color, texture, turgor are normal, no rashes or significant lesions EYES: normal,  conjunctiva are pink and non-injected, sclera clear OROPHARYNX:no exudate, no erythema and lips, buccal mucosa, and tongue normal  NECK: supple, thyroid normal size, non-tender, without nodularity LYMPH:  no palpable lymphadenopathy in the cervical, axillary or inguinal LUNGS: clear to auscultation and percussion with normal breathing effort HEART: regular rate & rhythm and no murmurs  ABDOMEN:abdomen soft, non-tender and normal bowel sounds Musculoskeletal:no cyanosis of digits and no clubbing  PSYCH: alert & oriented x 3 with fluent speech NEURO: no focal motor/sensory deficits Breasts: Breast inspection showed them to be symmetrical with no nipple discharge. (+) S/p right breast lumpectomy: Right breast incision site is clean and has healed well.  Palpation of the left breast and axilla revealed no obvious mass that I could appreciate.   LABORATORY DATA:  I have reviewed the data as listed CBC Latest Ref Rng & Units 09/01/2017 03/03/2017 08/30/2016  WBC 3.9 - 10.3 K/uL 5.7 6.8 7.7  Hemoglobin 11.6 - 15.9 g/dL 12.6 13.0 12.9  Hematocrit 34.8 - 46.6 % 37.7 40.3 38.9  Platelets 145 - 400 K/uL 174 194 187   CMP Latest Ref Rng & Units 09/01/2017 03/03/2017 08/30/2016  Glucose 70 - 99 mg/dL 124(H) 118 118  BUN 8 - 23 mg/dL 11 13 16.1  Creatinine 0.44 - 1.00 mg/dL 0.73 0.75 0.7  Sodium 135 - 145 mmol/L 142 141 140  Potassium 3.5 - 5.1 mmol/L 4.0 3.4 3.4(L)  Chloride 98 - 111 mmol/L 105 102 -  CO2 22 - 32 mmol/L 28 30(H) 29  Calcium 8.9 - 10.3 mg/dL 9.7 9.7 10.0  Total Protein 6.5 - 8.1 g/dL 7.6 7.8 7.9  Total Bilirubin 0.3 - 1.2 mg/dL 0.4 0.5 0.35  Alkaline Phos 38 - 126 U/L 85 85 82  AST 15 - 41 U/L 15 16 17   ALT 0 - 44 U/L 14 15 15       PATHOLOGY REPORT Diagnosis 08/04/2016 Surgical [P], cecum, polyp - TUBULAR ADENOMA. - NO HIGH GRADE DYSPLASIA OR MALIGNANCY.  Diagnosis 11/15/2014 Breast, right, needle core biopsy, lower outer - DUCTAL CARCINOMA IN SITU WITH CALCIFICATIONS. -  FIBROADENOMA. - SEE COMMENT. Results: IMMUNOHISTOCHEMICAL AND MORPHOMETRIC ANALYSIS PERFORMED MANUALLY Estrogen Receptor: 100%, POSITIVE, STRONG STAINING INTENSITY Progesterone Receptor: 90%, POSITIVE, STRONG STAINING INTENSITY  Diagnosis 12/17/2014 Breast, lumpectomy, Right USUAL DUCTAL HYPERPLASIA AND FIBROADENOMA BIOPSY SITE CHANGES NO RESIDUAL DUCTAL CARCINOMA IN SITU IS IDENTIFIED  PROCEDURES  08/04/2016 colonoscopy A 2 mm polyp was found in the cecum. The polyp was sessile. The polyp was removed with a cold snare. Resection and retrieval were complete. Findings: - The exam was otherwise without abnormality on direct and retroflexion views.   RADIOGRAPHIC STUDIES: I have personally reviewed the radiological images as listed and agreed with the findings in the report.  Diagnosic Mammogram bilateral 11/11/17 IMPRESSION: 1. No mammographic evidence of malignancy. 2. Stable right breast postsurgical changes  MM DIAG BREAST TOMO BILATERAL 11/11/16 IMPRESSION: 1. No mammographic evidence of malignancy. 2. Stable right breast postsurgical changes.  MM DIAG BREAST TOMO BILATERAL 11/06/15 IMPRESSION: No mammographic evidence of malignancy. BI-RADS CATEGORY  2: Benign.  Mm Digital Diagnostic Unilat R 11/12/2014      IMPRESSION: Grouped punctate and amorphous calcifications confirmed within the lower outer quadrant of the right breast, 6-7 o'clock axis region, for which stereotactic biopsy is recommended.  RECOMMENDATION: Stereotactic biopsy, possibly with tomosynthesis guidance, for the right breast calcifications.  Stereotactic  biopsy is scheduled for September 23rd.  I have discussed the findings and recommendations with the patient. Results were also provided in writing at the conclusion of the visit. If applicable, a reminder letter will be sent to the patient regarding the next appointment.  BI-RADS CATEGORY  4: Suspicious.   Electronically Signed   By: Franki Cabot M.D.   On:  11/12/2014 12:09   Mm Digital Screening Bilateral 11/06/2014   ACR Breast Density Category b: There are scattered areas of fibroglandular density.  FINDINGS: In the right breast, calcifications warrant further evaluation with magnified views. In the left breast, no findings suspicious for malignancy. Images were processed with CAD.  IMPRESSION: Further evaluation is suggested for calcifications in the right breast.  RECOMMENDATION: Diagnostic mammogram of the right breast. (Code:[id number])  The patient will be contacted regarding the findings, and additional imaging will be scheduled.  BI-RADS CATEGORY  0: Incomplete. Need additional imaging evaluation and/or prior mammograms for comparison.   Electronically Signed   By: Nolon Nations M.D.   On: 11/06/2014 10:27     ASSESSMENT: 67 y.o. African-American female, with past medical history of hypertension, arthritis, chronic back pain, obesity, and the well-controlled asthma, who was found to have right breast DCIS by screening mammogram.   1. right breast DCIS, ER/PR strongly positive -I previously reviewed her surgical pathology results, which showed no residual malignant cells.The patient had early stage disease. She is cured. Any form of adjuvant treatment is for preventive  -Given her ER/PR positive disease, she started on anastrozole for chemoprevention in 03/2015, plan for total 5 years.  She initially tolerated well, but gradually developed worsening arthralgia, and I stopped her anastrozole in July 2019.  Her pain at bilateral wrists has improved some, but overall arthralgia still significant.  Given her multiple comorbidities, I think the side effect of antiestrogen therapy is probably out weight the benefit a this point. I recommend her to stay off anastrozole. -Continue breast cancer surveillance with annual screening mammogram, self exam and routine follow-up. -Her physical exam and she is scheduled for  Mammogram and DEXA next week. There is  no clinical concern for recurrence. -she will continue with annual mammograms and DEXA every 2 years -I advised her to f/u with a PCP and see a OB/GYN. She wants to choose a new OB/GYN.  -She experiences back pain, and I advised her to see her neurosurgeon if it worsens.  -F/u in one year.  2. Osteopenia  -Repeated a bone density scan in February 2017 showed osteopenia, no high risk for fracture.  -She is on multiple vitamin which contains vitamin D thousand unit. I also encouraged her to take calcium 600 mg once daily.  -Pt is due for a bone density scan in 11/2017  3. Hypertension, diabetes, arthritis -Continue to follow up with her primary care physician -Pt's BP normal at 120/63 today   4. hypokalemia -Secondary to chlorthalidone  -She'll continue potassium 1 tablet day, and try to eat potassium-rich foods such as bananas.  5. Obesity  -Continued to encourage exercise and diet. -Pt had previously lost some weight with exercise, but has gained some weight back recently.    Plan -Mammogram and DEXA at [facility] in 11/2017  -F/u in one year -she will continue to be off anastrozole    l questions were answered. The patient knows to call the clinic with any problems, questions or concerns.  I spent 20 minutes counseling the patient face to face. The total time spent  in the appointment was 25 minutes and more than 50% was on counseling.  Dierdre Searles Dweik am acting as scribe for Dr. Truitt Merle.  I have reviewed the above documentation for accuracy and completeness, and I agree with the above.      Truitt Merle, MD 12/01/2017

## 2017-12-01 ENCOUNTER — Inpatient Hospital Stay: Payer: Medicare Other | Attending: Hematology | Admitting: Hematology

## 2017-12-01 ENCOUNTER — Encounter: Payer: Self-pay | Admitting: Hematology

## 2017-12-01 ENCOUNTER — Telehealth: Payer: Self-pay | Admitting: Hematology

## 2017-12-01 VITALS — BP 120/63 | HR 70 | Temp 98.3°F | Resp 18 | Ht 64.0 in | Wt 260.0 lb

## 2017-12-01 DIAGNOSIS — Z170 Estrogen receptor positive status [ER+]: Secondary | ICD-10-CM | POA: Diagnosis not present

## 2017-12-01 DIAGNOSIS — Z79811 Long term (current) use of aromatase inhibitors: Secondary | ICD-10-CM | POA: Insufficient documentation

## 2017-12-01 DIAGNOSIS — I10 Essential (primary) hypertension: Secondary | ICD-10-CM

## 2017-12-01 DIAGNOSIS — Z6841 Body Mass Index (BMI) 40.0 and over, adult: Secondary | ICD-10-CM

## 2017-12-01 DIAGNOSIS — D0511 Intraductal carcinoma in situ of right breast: Secondary | ICD-10-CM | POA: Diagnosis not present

## 2017-12-01 DIAGNOSIS — Z23 Encounter for immunization: Secondary | ICD-10-CM | POA: Insufficient documentation

## 2017-12-01 DIAGNOSIS — E119 Type 2 diabetes mellitus without complications: Secondary | ICD-10-CM

## 2017-12-01 DIAGNOSIS — M8580 Other specified disorders of bone density and structure, unspecified site: Secondary | ICD-10-CM | POA: Diagnosis not present

## 2017-12-01 MED ORDER — INFLUENZA VAC SPLIT QUAD 0.5 ML IM SUSY
0.5000 mL | PREFILLED_SYRINGE | Freq: Once | INTRAMUSCULAR | Status: AC
  Administered 2017-12-01: 0.5 mL via INTRAMUSCULAR

## 2017-12-01 MED ORDER — INFLUENZA VAC SPLIT QUAD 0.5 ML IM SUSY
PREFILLED_SYRINGE | INTRAMUSCULAR | Status: AC
  Filled 2017-12-01: qty 0.5

## 2017-12-01 NOTE — Telephone Encounter (Signed)
Scheduled appt per 10/10 los - gave patient aVS and calender per los.

## 2017-12-05 ENCOUNTER — Ambulatory Visit
Admission: RE | Admit: 2017-12-05 | Discharge: 2017-12-05 | Disposition: A | Discharge status: Home / Self Care | Payer: Medicare Other | Source: Ambulatory Visit | Attending: Hematology | Admitting: Hematology

## 2017-12-05 DIAGNOSIS — D0511 Intraductal carcinoma in situ of right breast: Secondary | ICD-10-CM

## 2017-12-05 DIAGNOSIS — M8580 Other specified disorders of bone density and structure, unspecified site: Secondary | ICD-10-CM

## 2017-12-08 ENCOUNTER — Other Ambulatory Visit: Payer: Self-pay | Admitting: Internal Medicine

## 2017-12-09 NOTE — Telephone Encounter (Signed)
Pt aware to follow up with new PCP for further refills due to Dr.Kwiakowski's retiring.  

## 2017-12-10 LAB — HM DIABETES EYE EXAM

## 2017-12-12 ENCOUNTER — Telehealth: Payer: Self-pay

## 2017-12-12 NOTE — Telephone Encounter (Signed)
Left voice message for patient that bone density scan was normal per Dr. Burr Medico.

## 2017-12-12 NOTE — Telephone Encounter (Signed)
-----   Message from Truitt Merle, MD sent at 12/12/2017  7:49 AM EDT ----- Please let pt know her DEXA was normal, thanks   Truitt Merle  12/12/2017

## 2017-12-12 NOTE — Telephone Encounter (Signed)
Okay for refill? Please advise 

## 2017-12-13 ENCOUNTER — Encounter: Payer: Self-pay | Admitting: Internal Medicine

## 2017-12-17 ENCOUNTER — Telehealth (HOSPITAL_COMMUNITY): Payer: Self-pay | Admitting: Emergency Medicine

## 2017-12-17 ENCOUNTER — Encounter (HOSPITAL_COMMUNITY): Payer: Self-pay

## 2017-12-17 ENCOUNTER — Ambulatory Visit (HOSPITAL_COMMUNITY)
Admission: EM | Admit: 2017-12-17 | Discharge: 2017-12-17 | Disposition: A | Discharge status: Home / Self Care | Payer: Medicare Other | Attending: Family Medicine | Admitting: Family Medicine

## 2017-12-17 DIAGNOSIS — M79651 Pain in right thigh: Secondary | ICD-10-CM | POA: Diagnosis not present

## 2017-12-17 DIAGNOSIS — R252 Cramp and spasm: Secondary | ICD-10-CM | POA: Diagnosis not present

## 2017-12-17 LAB — POCT I-STAT, CHEM 8
BUN: 14 mg/dL (ref 8–23)
Calcium, Ion: 1.22 mmol/L (ref 1.15–1.40)
Chloride: 106 mmol/L (ref 98–111)
Creatinine, Ser: 0.8 mg/dL (ref 0.44–1.00)
Glucose, Bld: 154 mg/dL — ABNORMAL HIGH (ref 70–99)
HCT: 40 % (ref 36.0–46.0)
Hemoglobin: 13.6 g/dL (ref 12.0–15.0)
Potassium: 3.8 mmol/L (ref 3.5–5.1)
Sodium: 144 mmol/L (ref 135–145)
TCO2: 27 mmol/L (ref 22–32)

## 2017-12-17 MED ORDER — MAGNESIUM OXIDE -MG SUPPLEMENT 400 (240 MG) MG PO TABS: 1.0000 | ORAL_TABLET | Freq: Two times a day (BID) | ORAL | 0 refills | Status: AC

## 2017-12-17 MED ORDER — MAGNESIUM OXIDE -MG SUPPLEMENT 400 (240 MG) MG PO TABS: 1.0000 | ORAL_TABLET | Freq: Two times a day (BID) | ORAL | 0 refills | Status: DC

## 2017-12-17 NOTE — Discharge Instructions (Signed)
Your blood work was normal.  Start magnesium oxide as directed.  Discontinue HCTZ for now, follow-up with PCP for further evaluation.

## 2017-12-17 NOTE — ED Provider Notes (Signed)
[facility]    CSN: [id number] Arrival date & time: 12/17/17  1702     History   Chief Complaint Chief Complaint  Patient presents with  . Leg Pain    HPI Darlene Mclaughlin is a 67 y.o. female.   67 year old female comes in for right anterior thigh cramping.  States this is been intermittent for the past week, resolved for a few days, and noticed cramping today that is worsening.  States pain is causing her to limp and have trouble walking.  She does water aerobics, but has been reducing amount since symptom onset last week.  Denies injury/trauma.  States try to use the sauna, warm compress without relief.  Denies bug bite/spider bites.  Patient recently switched medication to HCTZ, and restarted Lipitor.  States this has.  States she has been taking new medications for about 2 months.     Past Medical History:  Diagnosis Date  . ALLERGIC RHINITIS 05/15/2008  . Anemia   . ASTHMA 05/15/2008  . Blood transfusion without reported diagnosis 1991   anemia  . Breast cancer ([place]) 2016   right breast  . Cancer ([place])    DCIS right breast  . Diabetes mellitus without complication ([place])   . GERD (gastroesophageal reflux disease)   . Headache(784.0) 05/15/2008  . HYPERTENSION 05/15/2008  . IMPAIRED GLUCOSE TOLERANCE 05/15/2008  . Obesity   . OSTEOARTHRITIS 05/15/2008   back    Patient Active Problem List   Diagnosis Date Noted  . History of colonic polyps 10/18/2017  . Dyslipidemia 10/18/2017  . Essential hypertension 03/26/2016  . Family history of colon cancer 03/26/2016  . Osteopenia 08/28/2015  . 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 Spinal stenosis of lumbar region 12/05/2013  . Obesity 12/19/2012  . Diabetes mellitus with coincident hypertension ([place]) 05/15/2008  . Allergic rhinitis 05/15/2008  . ASTHMA 05/15/2008  . Osteoarthritis 05/15/2008  . HEADACHE 05/15/2008    Past Surgical History:  Procedure Laterality Date  . ABDOMINAL  HYSTERECTOMY    . BREAST LUMPECTOMY Right 12/16/2017  . BREAST LUMPECTOMY WITH RADIOACTIVE SEED LOCALIZATION Right 12/17/2014   Procedure: RIGHT BREAST RADIOACTIVE SEED GUIDED PARTIAL MASTECTOMY;  Surgeon: Erroll Luna, MD;  Location: [facility];  Service: General;  Laterality: Right;  . CESAREAN SECTION    . KNEE SURGERY     arthroscopic left    OB History    Gravida  2   Para  2   Term      Preterm      AB      Living        SAB      TAB      Ectopic      Multiple      Live Births           Obstetric Comments  She menarched at early age of 42 and went to menopause at age 40 (hysrectomy)  She had 2 pregnancy and 2 children, her first child was born at age 89 She did not breast-fed her child.  She received birth control pills for approximately 5 years  Sh e was never exposed to fertility medications or hormone replacement therapy.  She has family history of Breast cancer (mother)          Home Medications    Prior to Admission medications   Medication Sig Start Date End Date Taking? Authorizing Provider  acetaminophen (TYLENOL) 650 MG CR tablet Take 650  mg by mouth every 8 (eight) hours as needed.      [provider]  anastrozole (ARIMIDEX) 1 MG tablet TAKE 1 TABLET BY MOUTH EVERY DAY 10/31/17   Truitt Merle, MD  aspirin 81 MG tablet Take 81 mg by mouth daily.      [provider]  atorvastatin (LIPITOR) 10 MG tablet Take 1 tablet (10 mg total) by mouth daily. 10/18/17   Marletta Lor, MD  CALCIUM PO Take 1,000 mg by mouth daily.    [provider]  EPIPEN 2-PAK 0.3 MG/0.3ML SOAJ injection 0.3 mg. Reported on 05/01/2015 06/26/14   [provider]  fluticasone (FLONASE) 50 MCG/ACT nasal spray Place 1 spray into both nostrils daily. 07/30/14   Marletta Lor, MD  glucose blood (FREESTYLE LITE) test strip 1 each by Other route 2 (two) times daily. Use as instructed 06/21/11   Marletta Lor, MD    hydrochlorothiazide (HYDRODIURIL) 25 MG tablet TAKE 1 TABLET (25 MG TOTAL) BY MOUTH DAILY. 10/18/17   Marletta Lor, MD  Magnesium Oxide 400 (240 Mg) MG TABS Take 1 tablet (400 mg total) by mouth 2 (two) times daily for 7 days. 12/17/17 12/24/17  Ok Edwards, PA-C  Melatonin 10 MG TABS Take 1 tablet by mouth at bedtime.    [provider]  metFORMIN (GLUCOPHAGE-XR) 500 MG 24 hr tablet TAKE 2 TABLETS (1,000 MG TOTAL) BY MOUTH DAILY WITH SUPPER. 05/24/17   Marletta Lor, MD  Multiple Vitamin (MULTIVITAMIN) tablet Take 1 tablet by mouth daily.      [provider]  NON FORMULARY WEEKLY ALLERGY INJECTIONS     [provider]  pantoprazole (PROTONIX) 40 MG tablet TAKE 1 TABLET (40 MG TOTAL) BY MOUTH DAILY. 10/18/17   Marletta Lor, MD  potassium chloride SA (KLOR-CON M20) 20 MEQ tablet Take 1 tablet (20 mEq total) by mouth 2 (two) times daily. 10/18/17   Marletta Lor, MD  PROAIR HFA 108 [phone] BASE) MCG/ACT inhaler Inhale 1 puff into the lungs every 6 (six) hours as needed.  06/26/14   [provider]  traMADol (ULTRAM) 50 MG tablet TAKE 1 TABLET (50 MG TOTAL) BY MOUTH EVERY 6 (SIX) HOURS AS NEEDED. 12/12/17   Dorena Cookey, MD    Family History Family History  Problem Relation Age of Onset  . Diabetes Mother   . Hypertension Mother   . Breast cancer Mother 7       Breast cancer twice   . Hyperlipidemia Father   . Colon cancer Brother        4 th brother  . Diabetes Brother   . Pancreatic cancer Brother        middle brother  . Stomach cancer Neg Hx     Social History Social History   Tobacco Use  . Smoking status: Never Smoker  . Smokeless tobacco: Never Used  Substance Use Topics  . Alcohol use: Yes    Comment: RARELY  . Drug use: No     Allergies   Macrobid [nitrofurantoin monohyd macro]; Naproxen; and Zanaflex [tizanidine hcl]   Review of Systems Review of Systems  Reason unable to perform ROS: See HPI as above.      Physical Exam Triage Vital Signs ED Triage Vitals  Enc Vitals Group     BP 12/17/17 1733 (!) 142/74     Pulse Rate 12/17/17 1733 92     Resp 12/17/17 1733 16     Temp  12/17/17 1733 98.5 F (36.9 C)     Temp Source 12/17/17 1733 Oral     SpO2 12/17/17 1733 99 %     Weight --      Height --      Head Circumference --      Peak Flow --      Pain Score 12/17/17 1747 7     Pain Loc --      Pain Edu? --      Excl. in [place]? --    No data found.  Updated Vital Signs BP (!) 142/74 (BP Location: Left Arm)   Pulse 92   Temp 98.5 F (36.9 C) (Oral)   Resp 16   SpO2 99%   Physical Exam  Constitutional: She is oriented to person, place, and time. She appears well-developed and well-nourished. No distress.  HENT:  Head: Normocephalic and atraumatic.  Eyes: Pupils are equal, round, and reactive to light. Conjunctivae are normal.  Musculoskeletal:  No swelling, erythema, warmth.  Diffuse tenderness to palpation of left anterior thigh.  No tenderness to palpation of posterior thigh.  Full range of motion of hip and knees.  Strength normal and equal bilaterally.  Sensation intact and equal bilaterally.  Pedal pulse 2+ and equal bilaterally.  Negative Homans.  Neurological: She is alert and oriented to person, place, and time.  Skin: She is not diaphoretic.     UC Treatments / Results  Labs (all labs ordered are listed, but only abnormal results are displayed) Labs Reviewed  POCT I-STAT, CHEM 8 - Abnormal; Notable for the following components:      Result Value   Glucose, Bld 154 (*)    All other components within normal limits    EKG None  Radiology No results found.  Procedures Procedures (including critical care time)  Medications Ordered in UC Medications - No data to display  Initial Impression / Assessment and Plan / UC Course  I have reviewed the triage vital signs and the nursing notes.  Pertinent labs & imaging results that were available during my care  of the patient were reviewed by me and considered in my medical decision making (see chart for details).    I-STAT without electrolytes abnormality.  Discussed case with Dr. Joseph Art, who suggested discontinuing HCTZ, and start magnesium.  Will have patient call PCP Monday morning to inform of recent changes, and follow-up with PCP or for further evaluation and management needed.  Return precautions given.  Patient expresses understanding and agrees to plan.  Final Clinical Impressions(s) / UC Diagnoses   Final diagnoses:  Leg cramping    ED Prescriptions    Medication Sig Dispense Auth. Provider   Magnesium Oxide 400 (240 Mg) MG TABS Take 1 tablet (400 mg total) by mouth 2 (two) times daily for 7 days. 14 tablet Tobin Chad, [place] 12/17/17 [phone]

## 2017-12-17 NOTE — ED Triage Notes (Signed)
Pt presents with leg pain, more like severe cramps.

## 2017-12-19 ENCOUNTER — Ambulatory Visit: Payer: Medicare Other | Admitting: Family Medicine

## 2017-12-19 ENCOUNTER — Encounter: Payer: Self-pay | Admitting: Family Medicine

## 2017-12-19 VITALS — BP 140/72 | HR 85 | Temp 98.4°F | Wt 260.2 lb

## 2017-12-19 DIAGNOSIS — R600 Localized edema: Secondary | ICD-10-CM

## 2017-12-19 DIAGNOSIS — M79605 Pain in left leg: Secondary | ICD-10-CM

## 2017-12-19 MED ORDER — CHLORTHALIDONE 25 MG PO TABS: 25.0000 mg | ORAL_TABLET | Freq: Every day | ORAL | 2 refills | Status: DC

## 2017-12-19 NOTE — Progress Notes (Signed)
   Subjective:    Patient ID: Darlene Mclaughlin, female    DOB: [birth year], 67 y.o.   MRN: [id number]  HPI Here for 2 weeks of swelling and pain in the left leg. No hx of trauma. She always has some swelling in both lower legs and feet, but the left leg has had more than usual. She also describes a cramping type pain in the anterior left thigh that comes and goes. She notes that she was taking Simvastatin and had to stop it due to muscle pains. She has been on Atorvastatin now for 2 months. She was seen at Urgent Care on 12-11-17 and was told to stop taking HCTZ and to start taking OTC magnesium. Her labs that day showed normal electrolytes. Since then her symptoms have not changed. No chest pain or SOB.    Review of Systems  Constitutional: Negative.   Respiratory: Negative.   Cardiovascular: Positive for leg swelling. Negative for chest pain and palpitations.  Neurological: Negative.        Objective:   Physical Exam  Constitutional: She is oriented to person, place, and time. She appears well-developed and well-nourished. No distress.  Cardiovascular: Normal rate, regular rhythm, normal heart sounds and intact distal pulses.  Pulmonary/Chest: Effort normal and breath sounds normal. No stridor. No respiratory distress. She has no wheezes. She has no rales.  Musculoskeletal:  2+ edema in both lower legs. The left thigh is not tender, no cords are felt. Bevelyn Buckles is negative   Neurological: She is alert and oriented to person, place, and time.          Assessment & Plan:  Left leg swelling and pain. First we need to rule out a DVT, so we will set up a venous doppler of the left leg soon. In case this is related to her statin, she will stop the Atorvastatin for the time being. Try Chlorthalidone 25 mg daily for the edema (she has used this successfully in the past).  Alysia Penna, MD

## 2017-12-20 ENCOUNTER — Ambulatory Visit (HOSPITAL_COMMUNITY)
Admission: RE | Admit: 2017-12-20 | Discharge: 2017-12-20 | Disposition: A | Discharge status: Home / Self Care | Payer: Medicare Other | Source: Ambulatory Visit | Attending: Cardiology | Admitting: Cardiology

## 2017-12-20 DIAGNOSIS — M79605 Pain in left leg: Secondary | ICD-10-CM | POA: Insufficient documentation

## 2017-12-21 DIAGNOSIS — R600 Localized edema: Secondary | ICD-10-CM | POA: Insufficient documentation

## 2018-01-09 ENCOUNTER — Telehealth: Payer: Self-pay | Admitting: Family Medicine

## 2018-01-11 NOTE — Telephone Encounter (Signed)
Pt calling to check on this.  She thought that since she had seen Dr. Sarajane Jews he would be taking over her care since Dr. Raliegh Ip was not there.  Pt is out of her medication and needs to know what she must do to get this refilled.

## 2018-01-16 ENCOUNTER — Encounter: Payer: Self-pay | Admitting: Family Medicine

## 2018-01-16 ENCOUNTER — Ambulatory Visit: Payer: Medicare Other | Admitting: Family Medicine

## 2018-01-16 VITALS — BP 120/62 | HR 76 | Temp 98.3°F | Wt 255.6 lb

## 2018-01-16 DIAGNOSIS — Z789 Other specified health status: Secondary | ICD-10-CM

## 2018-01-16 DIAGNOSIS — E119 Type 2 diabetes mellitus without complications: Secondary | ICD-10-CM

## 2018-01-16 DIAGNOSIS — J309 Allergic rhinitis, unspecified: Secondary | ICD-10-CM | POA: Diagnosis not present

## 2018-01-16 DIAGNOSIS — I10 Essential (primary) hypertension: Secondary | ICD-10-CM | POA: Diagnosis not present

## 2018-01-16 DIAGNOSIS — E785 Hyperlipidemia, unspecified: Secondary | ICD-10-CM

## 2018-01-16 DIAGNOSIS — M545 Low back pain, unspecified: Secondary | ICD-10-CM

## 2018-01-16 DIAGNOSIS — M150 Primary generalized (osteo)arthritis: Secondary | ICD-10-CM | POA: Diagnosis not present

## 2018-01-16 DIAGNOSIS — E1169 Type 2 diabetes mellitus with other specified complication: Secondary | ICD-10-CM

## 2018-01-16 DIAGNOSIS — M159 Polyosteoarthritis, unspecified: Secondary | ICD-10-CM

## 2018-01-16 DIAGNOSIS — M8949 Other hypertrophic osteoarthropathy, multiple sites: Secondary | ICD-10-CM

## 2018-01-16 DIAGNOSIS — D0511 Intraductal carcinoma in situ of right breast: Secondary | ICD-10-CM

## 2018-01-16 DIAGNOSIS — J4520 Mild intermittent asthma, uncomplicated: Secondary | ICD-10-CM

## 2018-01-16 DIAGNOSIS — G8929 Other chronic pain: Secondary | ICD-10-CM

## 2018-01-16 MED ORDER — TRAMADOL HCL 50 MG PO TABS: 50.0000 mg | ORAL_TABLET | Freq: Two times a day (BID) | ORAL | 0 refills | Status: DC | PRN

## 2018-01-16 MED ORDER — BLOOD GLUCOSE MONITOR KIT: PACK | 0 refills | Status: DC

## 2018-01-16 NOTE — Telephone Encounter (Signed)
The last time I spoke with the pt she stated that she would call me back to see who she wanted as her PCP. I advise her that once she picked one then I would ask for further refills if appropriate. I left early Thursday and came back today. Pt had your name as PCP so I gathered she picked you then I sent the request.

## 2018-01-16 NOTE — Telephone Encounter (Signed)
Okay for refill? Please advise 

## 2018-01-16 NOTE — Telephone Encounter (Signed)
Gotcha! Thanks :) Just felt bad she waited so long.

## 2018-01-16 NOTE — Progress Notes (Signed)
Darlene Mclaughlin DOB: [birth year] Encounter date: 01/16/2018  This is a 67 y.o. female who presents to establish care. Chief Complaint  Patient presents with  . Transitions Of Care    needs new glucose kit    History of present illness: HTN:on chlorthalidone which she tolerates well. Does check at home. Usually around 122/70's.  DMII:on metformin; last A1C August 6.7. No issues with low sugars. Knows body and is good about keeping up with meals. Has been working on weight loss. Doing regular exercise in water (stopped after muscular pains with the statins).  Allergic rhinitis: Used to be more year round. Much better now. Does allergy shots now every other week which works well for her.  GERD: on protonix Osteopenia: last bone density 11/2017 - had improved to normal bone density status.  Tramadol:Has been getting from PCP - Uses one in morning and one at night. Uses for back pain. Does water aerobics. Started with back pain in 2008. Has tried massage, therapy. Has caused her significant pain. Saw surgeon but she really did not want to do this. Tried epidurals x 3. First 2 lasted a full year. Third one she felt hit a nerve and she couldn't walk out of office for a couple of hours. So since then has been trying to work in water. Has decided not to do surgery. Back in October had med reaction and started getting more aches/pains, muscles "not working". Had some bloodwork done at that time. Ended up stopping the statins due to this (tried atorvastatin and simvastatin).   Asthma: has albuterol inhaler-breathing has been good. Not having to use proair in last 4 years.   DCIS: Had small bx of right breast; seemed to remove Cancer with this surgery. Had repeat mammogram in September.   HL: couldn't tolerate statin. Is watching cholesterol intake.   Past Medical History:  Diagnosis Date  . ALLERGIC RHINITIS 05/15/2008  . Anemia   . ASTHMA 05/15/2008  . Blood transfusion without reported diagnosis  1991   anemia  . Breast cancer ([place]) 2016   right breast  . Cancer ([place])    DCIS right breast  . Diabetes mellitus without complication ([place])   . GERD (gastroesophageal reflux disease)   . Headache(784.0) 05/15/2008  . HYPERTENSION 05/15/2008  . IMPAIRED GLUCOSE TOLERANCE 05/15/2008  . Obesity   . OSTEOARTHRITIS 05/15/2008   back   Past Surgical History:  Procedure Laterality Date  . ABDOMINAL HYSTERECTOMY    . BREAST LUMPECTOMY Right 12/16/2017  . BREAST LUMPECTOMY WITH RADIOACTIVE SEED LOCALIZATION Right 12/17/2014   Procedure: RIGHT BREAST RADIOACTIVE SEED GUIDED PARTIAL MASTECTOMY;  Surgeon: Erroll Luna, MD;  Location: [facility];  Service: General;  Laterality: Right;  . CESAREAN SECTION    . KNEE SURGERY     arthroscopic left   Allergies  Allergen Reactions  . Atorvastatin   . Hydrochlorothiazide   . Macrobid [Nitrofurantoin Monohyd Macro]      Liver problems  . Naproxen   . Simvastatin     Hallucinations, muscle pains  . Zanaflex [Tizanidine Hcl] Other (See Comments)     Liver problems   Current Meds  Medication Sig  . acetaminophen (TYLENOL) 650 MG CR tablet Take 650 mg by mouth every 8 (eight) hours as needed.    Marland Kitchen aspirin 81 MG tablet Take 81 mg by mouth daily.    Marland Kitchen CALCIUM PO Take 1,000 mg by mouth daily.  . chlorthalidone (HYGROTON) 25 MG tablet Take 1 tablet (25 mg  total) by mouth daily.  Marland Kitchen EPIPEN 2-PAK 0.3 MG/0.3ML SOAJ injection 0.3 mg. Reported on 05/01/2015  . fluticasone (FLONASE) 50 MCG/ACT nasal spray Place 1 spray into both nostrils daily.  Marland Kitchen glucose blood (FREESTYLE LITE) test strip 1 each by Other route 2 (two) times daily. Use as instructed  . Melatonin 10 MG TABS Take 1 tablet by mouth at bedtime.  . metFORMIN (GLUCOPHAGE-XR) 500 MG 24 hr tablet TAKE 2 TABLETS (1,000 MG TOTAL) BY MOUTH DAILY WITH SUPPER.  . Multiple Vitamin (MULTIVITAMIN) tablet Take 1 tablet by mouth daily.    . NON FORMULARY WEEKLY ALLERGY INJECTIONS   .  pantoprazole (PROTONIX) 40 MG tablet TAKE 1 TABLET (40 MG TOTAL) BY MOUTH DAILY.  Marland Kitchen potassium chloride SA (KLOR-CON M20) 20 MEQ tablet Take 1 tablet (20 mEq total) by mouth 2 (two) times daily.  Marland Kitchen PROAIR HFA 108 (90 BASE) MCG/ACT inhaler Inhale 1 puff into the lungs every 6 (six) hours as needed.   . traMADol (ULTRAM) 50 MG tablet TAKE 1 TABLET (50 MG TOTAL) BY MOUTH EVERY 6 (SIX) HOURS AS NEEDED.   Social History   Tobacco Use  . Smoking status: Never Smoker  . Smokeless tobacco: Never Used  Substance Use Topics  . Alcohol use: Yes    Comment: RARELY   Family History  Problem Relation Age of Onset  . Diabetes Mother   . Hypertension Mother   . Breast cancer Mother 53       Breast cancer twice   . Hyperlipidemia Father   . Colon cancer Brother        4 th brother  . Diabetes Brother   . Pancreatic cancer Brother        middle brother  . Stomach cancer Neg Hx      Review of Systems  Constitutional: Negative for chills, fatigue and fever.  Respiratory: Negative for cough, chest tightness, shortness of breath and wheezing.   Cardiovascular: Negative for chest pain, palpitations and leg swelling.    Objective:  BP (!) 150/70 (BP Location: Left Arm, Patient Position: Sitting, Cuff Size: Large)   Pulse 76   Temp 98.3 F (36.8 C) (Oral)   Wt 255 lb 9.6 oz (115.9 kg)   SpO2 97%   BMI 43.87 kg/m   Weight: 255 lb 9.6 oz (115.9 kg)   BP Readings from Last 3 Encounters:  01/16/18 (!) 150/70  12/19/17 140/72  12/17/17 (!) 142/74   Wt Readings from Last 3 Encounters:  01/16/18 255 lb 9.6 oz (115.9 kg)  12/19/17 260 lb 3 oz (118 kg)  12/01/17 260 lb (117.9 kg)    Physical Exam  Constitutional: She appears well-developed and well-nourished. No distress.  HENT:  Head: Normocephalic and atraumatic.  Cardiovascular: Normal rate, regular rhythm, normal heart sounds and intact distal pulses.  No murmur heard. Pulmonary/Chest: Effort normal and breath sounds normal.   Abdominal: Soft. Bowel sounds are normal. She exhibits no distension. There is no tenderness. There is no guarding.  Skin: Skin is warm and dry.  Sensory exam of the foot is normal, tested with the monofilament. Good pulses, no lesions or ulcers, good peripheral pulses.  Psychiatric: She has a normal mood and affect. Judgment normal.    Assessment/Plan:  1. Diabetes mellitus with coincident hypertension ([place]) Has been well controlled. - Microalbumin / creatinine urine ratio; Future - Hemoglobin A1c; Future - HM DIABETES FOOT EXAM - Comprehensive metabolic panel; Future  2. Essential hypertension Elevated in office today. Does state  she has some white coat elevation. Recheck same. Numbers at home in low normal range. Will re-evaluate at next visit. - Comprehensive metabolic panel; Future - CBC with Differential/Platelet; Future  3. Allergic rhinitis, unspecified seasonality, unspecified trigger Continue with allergy shots/follow ups.   4. Primary osteoarthritis involving multiple joints She does well with intermittent use of tramadol.  She uses no more than twice a day.  This does help her be able to stay active, continue regular exercise.[place] controlled substances database was reviewed.  No red flags.  We will plan to sign narcotic contract at follow-up visit.  Okay with refills until that time.   5. Mild intermittent asthma without complication Symptoms controlled.  Continue as needed inhaler.  6. Ductal carcinoma in situ (DCIS) of right breast Has followed with oncology.  Anastrozole was stopped in July 2019 due to arthralgia.  We will continue with breast cancer surveillance and annual screening mammograms.  7. Hyperlipidemia associated with type 2 diabetes mellitus ([place]) - Lipid panel; Future - TSH; Future  8. Statin intolerance Will plan to recheck lipids off of medication in spring.   Return in about 4 months (around 05/17/2018).  Micheline Rough,  MD

## 2018-01-16 NOTE — Telephone Encounter (Signed)
She had appointment with me today so I sent this in already.   Can you check why message took so long to come through?

## 2018-01-16 NOTE — Patient Instructions (Signed)
Bloodwork in March; appointment to follow.

## 2018-01-17 NOTE — Telephone Encounter (Signed)
No problem! Thanks for checking

## 2018-02-07 ENCOUNTER — Ambulatory Visit: Payer: Medicare Other | Admitting: Podiatry

## 2018-02-20 ENCOUNTER — Telehealth: Payer: Self-pay | Admitting: Family Medicine

## 2018-02-20 NOTE — Telephone Encounter (Signed)
Last fill 01/16/18 Last OV 01/16/18  Ok to fill?

## 2018-02-20 NOTE — Telephone Encounter (Signed)
Pt is out of Tramadol & says she needs a refill due back issues. 50mg / [facility]. [employer]

## 2018-02-23 ENCOUNTER — Other Ambulatory Visit: Payer: Self-pay

## 2018-02-23 DIAGNOSIS — M545 Low back pain, unspecified: Secondary | ICD-10-CM

## 2018-02-23 DIAGNOSIS — G8929 Other chronic pain: Secondary | ICD-10-CM

## 2018-02-23 NOTE — Telephone Encounter (Signed)
Tramadol Last filled 01/16/18 Last OV 01/16/18

## 2018-02-24 ENCOUNTER — Other Ambulatory Visit: Payer: Self-pay | Admitting: Family Medicine

## 2018-02-24 NOTE — Telephone Encounter (Signed)
Patient is calling back. She states she originally requested this 2 weeks ago. I see first request on 02/20/18. Informed her Dr. Ethlyn Gallery was out of the office this week. Please advise.

## 2018-02-24 NOTE — Telephone Encounter (Signed)
Dr. Ethlyn Gallery please advise on this refill for the pt.  Thanks

## 2018-02-26 ENCOUNTER — Other Ambulatory Visit: Payer: Self-pay | Admitting: Family Medicine

## 2018-02-26 DIAGNOSIS — M545 Low back pain, unspecified: Secondary | ICD-10-CM

## 2018-02-26 DIAGNOSIS — G8929 Other chronic pain: Secondary | ICD-10-CM

## 2018-02-26 MED ORDER — TRAMADOL HCL 50 MG PO TABS: 50.0000 mg | ORAL_TABLET | Freq: Two times a day (BID) | ORAL | 0 refills | Status: DC | PRN

## 2018-02-26 NOTE — Progress Notes (Unsigned)
She has had a difficult time with last 2 refills of medication. Last one sent when I was out of office so refill was delayed. I have sent in 3 prescriptions for her to the pharmacy which should last her until her upcoming appointment in March. Hopefully this will eliminate her hassle with refills.

## 2018-02-26 NOTE — Telephone Encounter (Signed)
I sent in refill and sent you note regarding refill.

## 2018-02-27 NOTE — Progress Notes (Signed)
Left a detailed message on verified voice mail.   

## 2018-03-29 ENCOUNTER — Telehealth: Payer: Self-pay | Admitting: Family Medicine

## 2018-03-29 NOTE — Telephone Encounter (Signed)
Pt was called about the below msg and will call when she ready to do her toc appointment with Dr. Jerilee Hoh.

## 2018-03-29 NOTE — Telephone Encounter (Signed)
Pt would like to transfer to Dr. Jerilee Hoh from Dr. Ethlyn Gallery is this okay?

## 2018-03-29 NOTE — Telephone Encounter (Signed)
ok 

## 2018-03-29 NOTE — Telephone Encounter (Signed)
Fine by me 

## 2018-03-31 NOTE — Telephone Encounter (Signed)
Pt has been scheduled.  °

## 2018-04-04 ENCOUNTER — Ambulatory Visit: Payer: Medicare Other | Admitting: Internal Medicine

## 2018-04-04 ENCOUNTER — Encounter: Payer: Self-pay | Admitting: Internal Medicine

## 2018-04-04 VITALS — BP 124/78 | HR 83 | Temp 97.8°F | Ht 64.0 in | Wt 253.6 lb

## 2018-04-04 DIAGNOSIS — G8929 Other chronic pain: Secondary | ICD-10-CM

## 2018-04-04 DIAGNOSIS — I10 Essential (primary) hypertension: Secondary | ICD-10-CM | POA: Diagnosis not present

## 2018-04-04 DIAGNOSIS — E119 Type 2 diabetes mellitus without complications: Secondary | ICD-10-CM

## 2018-04-04 DIAGNOSIS — M545 Low back pain, unspecified: Secondary | ICD-10-CM

## 2018-04-04 DIAGNOSIS — E785 Hyperlipidemia, unspecified: Secondary | ICD-10-CM | POA: Diagnosis not present

## 2018-04-04 DIAGNOSIS — D0511 Intraductal carcinoma in situ of right breast: Secondary | ICD-10-CM

## 2018-04-04 LAB — POCT GLYCOSYLATED HEMOGLOBIN (HGB A1C): Hemoglobin A1C: 6.2 % — AB (ref 4.0–5.6)

## 2018-04-04 MED ORDER — TRAMADOL HCL 50 MG PO TABS: 50.0000 mg | ORAL_TABLET | Freq: Two times a day (BID) | ORAL | 0 refills | Status: DC | PRN

## 2018-04-04 MED ORDER — TRAMADOL HCL 50 MG PO TABS: 50.0000 mg | ORAL_TABLET | Freq: Two times a day (BID) | ORAL | 0 refills | Status: DC
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
## 2018-04-04 NOTE — Patient Instructions (Addendum)
Great meeting you today!  Instructions: -Schedule follow up in 3 months for diabetic management and for medication refills. -Good job on keeping your diabetes under good control!

## 2018-05-09 ENCOUNTER — Other Ambulatory Visit: Payer: Self-pay

## 2018-05-09 ENCOUNTER — Other Ambulatory Visit (INDEPENDENT_AMBULATORY_CARE_PROVIDER_SITE_OTHER): Payer: Medicare Other

## 2018-05-09 DIAGNOSIS — E119 Type 2 diabetes mellitus without complications: Secondary | ICD-10-CM | POA: Diagnosis not present

## 2018-05-09 DIAGNOSIS — I10 Essential (primary) hypertension: Secondary | ICD-10-CM

## 2018-05-09 DIAGNOSIS — E1169 Type 2 diabetes mellitus with other specified complication: Secondary | ICD-10-CM

## 2018-05-09 DIAGNOSIS — E785 Hyperlipidemia, unspecified: Secondary | ICD-10-CM

## 2018-05-09 LAB — CBC WITH DIFFERENTIAL/PLATELET
Basophils Absolute: 0 10*3/uL (ref 0.0–0.1)
Basophils Relative: 0.6 % (ref 0.0–3.0)
Eosinophils Absolute: 0.2 10*3/uL (ref 0.0–0.7)
Eosinophils Relative: 2.9 % (ref 0.0–5.0)
HCT: 38.2 % (ref 36.0–46.0)
Hemoglobin: 12.8 g/dL (ref 12.0–15.0)
Lymphocytes Relative: 27.6 % (ref 12.0–46.0)
Lymphs Abs: 1.7 10*3/uL (ref 0.7–4.0)
MCHC: 33.6 g/dL (ref 30.0–36.0)
MCV: 92.9 fl (ref 78.0–100.0)
Monocytes Absolute: 0.5 10*3/uL (ref 0.1–1.0)
Monocytes Relative: 8.8 % (ref 3.0–12.0)
Neutro Abs: 3.7 10*3/uL (ref 1.4–7.7)
Neutrophils Relative %: 60.1 % (ref 43.0–77.0)
Platelets: 195 10*3/uL (ref 150.0–400.0)
RBC: 4.11 Mil/uL (ref 3.87–5.11)
RDW: 14.1 % (ref 11.5–15.5)
WBC: 6.2 10*3/uL (ref 4.0–10.5)

## 2018-05-09 LAB — HEMOGLOBIN A1C: Hgb A1c MFr Bld: 6.9 % — ABNORMAL HIGH (ref 4.6–6.5)

## 2018-05-09 LAB — LIPID PANEL
Cholesterol: 209 mg/dL — ABNORMAL HIGH (ref 0–200)
HDL: 48 mg/dL (ref >39.00)
LDL Cholesterol: 137 mg/dL — ABNORMAL HIGH (ref 0–99)
NonHDL: 160.94
Total CHOL/HDL Ratio: 4
Triglycerides: 122 mg/dL (ref 0.0–149.0)
VLDL: 24.4 mg/dL (ref 0.0–40.0)

## 2018-05-09 LAB — COMPREHENSIVE METABOLIC PANEL
ALT: 13 U/L (ref 0–35)
AST: 13 U/L (ref 0–37)
Albumin: 4.1 g/dL (ref 3.5–5.2)
Alkaline Phosphatase: 69 U/L (ref 39–117)
BUN: 15 mg/dL (ref 6–23)
CO2: 32 mEq/L (ref 19–32)
Calcium: 9.7 mg/dL (ref 8.4–10.5)
Chloride: 100 mEq/L (ref 96–112)
Creatinine, Ser: 0.61 mg/dL (ref 0.40–1.20)
GFR: 117.46 mL/min (ref >60.00)
Glucose, Bld: 160 mg/dL — ABNORMAL HIGH (ref 70–99)
Potassium: 3.9 mEq/L (ref 3.5–5.1)
Sodium: 140 mEq/L (ref 135–145)
Total Bilirubin: 0.5 mg/dL (ref 0.2–1.2)
Total Protein: 7.3 g/dL (ref 6.0–8.3)

## 2018-05-09 LAB — MICROALBUMIN / CREATININE URINE RATIO
Creatinine,U: 70.9 mg/dL
Microalb Creat Ratio: 1 mg/g (ref 0.0–30.0)
Microalb, Ur: <0.7 mg/dL (ref 0.0–1.9)

## 2018-05-09 LAB — TSH: TSH: 1.61 u[IU]/mL (ref 0.35–4.50)

## 2018-05-10 ENCOUNTER — Other Ambulatory Visit: Payer: Self-pay | Admitting: Internal Medicine

## 2018-05-17 ENCOUNTER — Ambulatory Visit: Payer: Medicare Other | Admitting: Family Medicine

## 2018-06-09 ENCOUNTER — Other Ambulatory Visit: Payer: Self-pay | Admitting: Internal Medicine

## 2018-06-09 MED ORDER — METFORMIN HCL ER 500 MG PO TB24: 1000.0000 mg | ORAL_TABLET | Freq: Every day | ORAL | 0 refills | Status: DC

## 2018-06-09 NOTE — Telephone Encounter (Signed)
Due for f/u in May Requested Prescriptions  Pending Prescriptions Disp Refills  . metFORMIN (GLUCOPHAGE-XR) 500 MG 24 hr tablet 60 tablet 0    Sig: Take 2 tablets (1,000 mg total) by mouth daily with supper.     Endocrinology:  Diabetes - Biguanides Passed - 06/09/2018  3:30 PM      Passed - Cr in normal range and within 360 days    Creatinine  Date Value Ref Range Status  08/30/2016 0.7 0.6 - 1.1 mg/dL Final   Creatinine, Ser  Date Value Ref Range Status  05/09/2018 0.61 0.40 - 1.20 mg/dL Final         Passed - HBA1C is between 0 and 7.9 and within 180 days    Hgb A1c MFr Bld  Date Value Ref Range Status  05/09/2018 6.9 (H) 4.6 - 6.5 % Final    Comment:    Glycemic Control Guidelines for People with Diabetes:Non Diabetic:  <6%Goal of Therapy: <7%Additional Action Suggested:  >8%          Passed - eGFR in normal range and within 360 days    GFR calc Af Amer  Date Value Ref Range Status  09/01/2017 >60 >60 mL/min Final    Comment:    (NOTE) The eGFR has been calculated using the CKD EPI equation. This calculation has not been validated in all clinical situations. eGFR's persistently <60 mL/min signify possible Chronic Kidney Disease.    GFR calc non Af Amer  Date Value Ref Range Status  09/01/2017 >60 >60 mL/min Final   GFR  Date Value Ref Range Status  05/09/2018 117.46 >60.00 mL/min Final         Passed - Valid encounter within last 6 months    Recent Outpatient Visits          2 months ago Diabetes mellitus with coincident hypertension ([place])   [place] [facility], Rayford Halsted, MD   4 months ago Diabetes mellitus with coincident hypertension ([place])   [facility] [facility], Steele Berg, MD   5 months ago Left leg pain   [facility] at [place], MD   7 months ago Encounter for preventive health examination   [facility], Doretha Sou, MD   9 months ago Tenosynovitis,  [place] [facility] [place], MD      Future Appointments            In 3 weeks Isaac Bliss, Rayford Halsted, MD [place] [facility] [place]   In 2 months  [employer] [facility] at [place], [facility]

## 2018-07-04 ENCOUNTER — Other Ambulatory Visit: Payer: Self-pay

## 2018-07-04 ENCOUNTER — Other Ambulatory Visit: Payer: Self-pay | Admitting: Internal Medicine

## 2018-07-04 ENCOUNTER — Ambulatory Visit (INDEPENDENT_AMBULATORY_CARE_PROVIDER_SITE_OTHER): Payer: Medicare Other | Admitting: Internal Medicine

## 2018-07-04 DIAGNOSIS — M48061 Spinal stenosis, lumbar region without neurogenic claudication: Secondary | ICD-10-CM

## 2018-07-04 DIAGNOSIS — E119 Type 2 diabetes mellitus without complications: Secondary | ICD-10-CM | POA: Diagnosis not present

## 2018-07-04 DIAGNOSIS — E785 Hyperlipidemia, unspecified: Secondary | ICD-10-CM

## 2018-07-04 DIAGNOSIS — I10 Essential (primary) hypertension: Secondary | ICD-10-CM

## 2018-07-04 DIAGNOSIS — D0511 Intraductal carcinoma in situ of right breast: Secondary | ICD-10-CM
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
## 2018-08-24 ENCOUNTER — Other Ambulatory Visit: Payer: Self-pay | Admitting: Internal Medicine

## 2018-08-24 ENCOUNTER — Ambulatory Visit: Payer: Medicare Other

## 2018-08-24 DIAGNOSIS — M545 Low back pain, unspecified: Secondary | ICD-10-CM

## 2018-08-24 DIAGNOSIS — G8929 Other chronic pain: Secondary | ICD-10-CM

## 2018-08-26 ENCOUNTER — Other Ambulatory Visit: Payer: Self-pay | Admitting: Family Medicine

## 2018-09-25 ENCOUNTER — Other Ambulatory Visit: Payer: Self-pay | Admitting: Internal Medicine

## 2018-09-25 DIAGNOSIS — M545 Low back pain, unspecified: Secondary | ICD-10-CM

## 2018-09-25 DIAGNOSIS — G8929 Other chronic pain: Secondary | ICD-10-CM

## 2018-10-10 ENCOUNTER — Other Ambulatory Visit: Payer: Self-pay

## 2018-10-10 ENCOUNTER — Ambulatory Visit (INDEPENDENT_AMBULATORY_CARE_PROVIDER_SITE_OTHER): Payer: Medicare Other | Admitting: Internal Medicine

## 2018-10-10 ENCOUNTER — Ambulatory Visit: Payer: Self-pay | Admitting: Internal Medicine

## 2018-10-10 ENCOUNTER — Other Ambulatory Visit: Payer: Self-pay | Admitting: *Deleted

## 2018-10-10 DIAGNOSIS — E785 Hyperlipidemia, unspecified: Secondary | ICD-10-CM

## 2018-10-10 DIAGNOSIS — M545 Low back pain, unspecified: Secondary | ICD-10-CM

## 2018-10-10 DIAGNOSIS — I10 Essential (primary) hypertension: Secondary | ICD-10-CM

## 2018-10-10 DIAGNOSIS — G8929 Other chronic pain: Secondary | ICD-10-CM

## 2018-10-10 DIAGNOSIS — Z789 Other specified health status: Secondary | ICD-10-CM

## 2018-10-10 DIAGNOSIS — E119 Type 2 diabetes mellitus without complications: Secondary | ICD-10-CM | POA: Diagnosis not present

## 2018-10-10 DIAGNOSIS — M48061 Spinal stenosis, lumbar region without neurogenic claudication: Secondary | ICD-10-CM

## 2018-10-10 MED ORDER — FREESTYLE LANCETS MISC: 1.0000 | Freq: Two times a day (BID) | 12 refills | Status: DC

## 2018-10-10 MED ORDER — TRAMADOL HCL 50 MG PO TABS: 50.0000 mg | ORAL_TABLET | Freq: Two times a day (BID) | ORAL | 0 refills | Status: DC

## 2018-10-10 MED ORDER — TRAMADOL HCL 50 MG PO TABS: 50.0000 mg | ORAL_TABLET | Freq: Two times a day (BID) | ORAL | 0 refills | Status: DC | PRN

## 2018-10-10 MED ORDER — FREESTYLE LITE TEST VI STRP: 1.0000 | ORAL_STRIP | Freq: Two times a day (BID) | 12 refills | Status: DC
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
## 2018-10-11 ENCOUNTER — Other Ambulatory Visit: Payer: Self-pay | Admitting: Internal Medicine

## 2018-10-13 ENCOUNTER — Telehealth: Payer: Self-pay | Admitting: Internal Medicine

## 2018-10-13 MED ORDER — LANCETS MISC: 1.0000 | Freq: Every day | 3 refills | Status: DC

## 2018-10-13 MED ORDER — ACCU-CHEK GUIDE VI STRP: 1.0000 | ORAL_STRIP | Freq: Every day | 3 refills | Status: DC

## 2018-10-13 NOTE — Telephone Encounter (Signed)
Rx sent 

## 2018-10-13 NOTE — Telephone Encounter (Signed)
Pharmacy received Rx for new glucose meter, but no lancets or test strips. Pharmacy requesting new Rxfor both as well.  [id number] [address], [place] [phone] (Phone) [phone] (Fax)

## 2018-11-09 ENCOUNTER — Other Ambulatory Visit: Payer: Self-pay | Admitting: Hematology

## 2018-11-09 DIAGNOSIS — Z853 Personal history of malignant neoplasm of breast: Secondary | ICD-10-CM

## 2018-11-14 ENCOUNTER — Telehealth: Payer: Self-pay | Admitting: Internal Medicine

## 2018-11-14 NOTE — Telephone Encounter (Signed)
Pt stated her metFORMIN (GLUCOPHAGE-XR) 500 MG 24 hr tablet  Has been recalled and she would like to know what Dr. Jerilee Hoh would like her to do. Please advise. [phone]

## 2018-11-15 MED ORDER — METFORMIN HCL 500 MG PO TABS: 500.0000 mg | ORAL_TABLET | Freq: Two times a day (BID) | ORAL | 1 refills | Status: DC

## 2018-11-15 NOTE — Telephone Encounter (Signed)
New Rx sent and med list updated

## 2018-11-15 NOTE — Telephone Encounter (Signed)
We can send in Rx for regular metformin (not XR) same dose.

## 2018-11-23 ENCOUNTER — Inpatient Hospital Stay: Payer: Medicare Other | Attending: Hematology | Admitting: Hematology

## 2018-11-23 DIAGNOSIS — Z923 Personal history of irradiation: Secondary | ICD-10-CM | POA: Insufficient documentation

## 2018-11-23 DIAGNOSIS — Z853 Personal history of malignant neoplasm of breast: Secondary | ICD-10-CM | POA: Insufficient documentation

## 2018-11-23 DIAGNOSIS — I10 Essential (primary) hypertension: Secondary | ICD-10-CM | POA: Insufficient documentation

## 2018-11-23 DIAGNOSIS — E669 Obesity, unspecified: Secondary | ICD-10-CM | POA: Insufficient documentation

## 2018-11-23 DIAGNOSIS — E876 Hypokalemia: Secondary | ICD-10-CM | POA: Insufficient documentation

## 2018-11-23 DIAGNOSIS — Z79899 Other long term (current) drug therapy: Secondary | ICD-10-CM | POA: Insufficient documentation

## 2018-11-23 DIAGNOSIS — M1990 Unspecified osteoarthritis, unspecified site: Secondary | ICD-10-CM | POA: Insufficient documentation

## 2018-11-23 DIAGNOSIS — E119 Type 2 diabetes mellitus without complications: Secondary | ICD-10-CM | POA: Insufficient documentation

## 2018-11-23 DIAGNOSIS — M8580 Other specified disorders of bone density and structure, unspecified site: Secondary | ICD-10-CM | POA: Insufficient documentation

## 2018-11-23 DIAGNOSIS — Z170 Estrogen receptor positive status [ER+]: Secondary | ICD-10-CM | POA: Insufficient documentation

## 2018-11-28 ENCOUNTER — Telehealth: Payer: Self-pay | Admitting: Hematology

## 2018-11-28 NOTE — Telephone Encounter (Signed)
Returned patient's phone call regarding rescheduling missed 10/01 appointment, per patient's request appointment has moved to 10/12.

## 2018-11-30 NOTE — Progress Notes (Signed)
[place] [facility]   [phone] [phone]   Clinic Follow up Note   Patient Care Team: Isaac Bliss, Rayford Halsted, MD as PCP - General (Internal Medicine) Erroll Luna, MD as Consulting Physician (General Surgery) Arloa Koh, MD (Inactive) as Consulting Physician (Radiation Oncology) Truitt Merle, MD as Consulting Physician (Hematology) Sylvan Cheese, NP as Nurse Practitioner (Hematology and Oncology)  Date of Service:  12/04/2018  CHIEF COMPLAINT: Follow up right breast DCIS   SUMMARY OF ONCOLOGIC HISTORY: Oncology History Overview Note  Breast cancer of lower-outer quadrant of right female breast [facility])   Staging form: Breast, AJCC 7th Edition     Clinical stage from 11/15/2014: Stage 0 (Tis (DCIS), N0, M0) - Signed by Truitt Merle, MD on 11/26/2014     Ductal carcinoma in situ (DCIS) of right breast  11/06/2014 Mammogram   Screening mammogram showed calcifications in the lower outer quadrant of the right breast, which was confirmed by diagnostic mammogram   11/15/2014 Initial Biopsy   Breast core needle biopsy showed ductal carcinoma in situ with calcifications.   11/15/2014 Receptors her2   ER+ (100%); PR+ (90%)   11/15/2014 Clinical Stage   Stage 0: Tis N0   12/17/2014 Surgery   Right breast lumpectomy.   12/17/2014 Pathology Results   Right breast lumpectomy showed usual ductal Hyperplasia and fibroadenoma. No residual ductal carcinoma in situ.     Radiation Therapy   Not recommended due to lack of residual malignancy at time of definitive surgery   03/28/2015 - 08/2017 Anti-estrogen oral therapy   Anastrozole 1 mg daily begun 03/28/2015 and stopped 08/2017 due to poor tolerance.    05/01/2015 Survivorship   Survivorship visit completed and copy of care plan provided to patient   11/06/2015 Imaging   MM DIAG BREAST TOMO BILATERAL 11/06/15 IMPRESSION: No mammographic evidence of malignancy. BI-RADS CATEGORY  2: Benign.    08/04/2016 Pathology Results   Diagnosis  Surgical [P], cecum, polyp - TUBULAR ADENOMA. - NO HIGH GRADE DYSPLASIA OR MALIGNANCY.   08/04/2016 Procedure   Colonoscopy A 2 mm polyp was found in the cecum. The polyp was sessile. The polyp was removed with a cold snare. Resection and retrieval were complete. Findings: - The exam was otherwise without abnormality on direct and retroflexion views.   11/11/2016 Mammogram   IMPRESSION: 1. No mammographic evidence of malignancy. 2. Stable right breast postsurgical changes.      CURRENT THERAPY:  Surveillance   INTERVAL HISTORY:  JERYN BERTONI is here for a follow up of her right breast cancer. She presents to the clinic alone. She notes he is doing well and stable with no new changes. She notes no new changes in her arthritis since stopping anastrozole. Her main pain in her left wrist. She has not been as active due to [place]. She does walk in the neighborhood. I reviewed her medication list with her. She takes tramadol BID for her joint pain.    REVIEW OF SYSTEMS:   Constitutional: Denies fevers, chills or abnormal weight loss Eyes: Denies blurriness of vision Ears, nose, mouth, throat, and face: Denies mucositis or sore throat Respiratory: Denies cough, dyspnea or wheezes Cardiovascular: Denies palpitation, chest discomfort or lower extremity swelling Gastrointestinal:  Denies nausea, heartburn or change in bowel habits Skin: Denies abnormal skin rashes MSK: (+) Arthritis, mainly in her left wrist.   Lymphatics: Denies new lymphadenopathy or easy bruising Neurological:Denies numbness, tingling or new weaknesses Behavioral/Psych: Mood is stable, no new changes  All  other systems were reviewed with the patient and are negative.  MEDICAL HISTORY:  Past Medical History:  Diagnosis Date  . ALLERGIC RHINITIS 05/15/2008  . Anemia   . ASTHMA 05/15/2008  . Blood transfusion without reported diagnosis 1991   anemia  . Breast cancer  ([place]) 2016   right breast  . Cancer ([place])    DCIS right breast  . Diabetes mellitus without complication ([place])   . GERD (gastroesophageal reflux disease)   . Headache(784.0) 05/15/2008  . HYPERTENSION 05/15/2008  . IMPAIRED GLUCOSE TOLERANCE 05/15/2008  . Obesity   . OSTEOARTHRITIS 05/15/2008   back    SURGICAL HISTORY: Past Surgical History:  Procedure Laterality Date  . ABDOMINAL HYSTERECTOMY    . BREAST LUMPECTOMY Right 12/16/2017  . BREAST LUMPECTOMY WITH RADIOACTIVE SEED LOCALIZATION Right 12/17/2014   Procedure: RIGHT BREAST RADIOACTIVE SEED GUIDED PARTIAL MASTECTOMY;  Surgeon: Erroll Luna, MD;  Location: [facility];  Service: General;  Laterality: Right;  . CESAREAN SECTION    . KNEE SURGERY     arthroscopic left    I have reviewed the social history and family history with the patient and they are unchanged from previous note.  ALLERGIES:  is allergic to atorvastatin; hydrochlorothiazide; macrobid [nitrofurantoin monohyd macro]; naproxen; simvastatin; and zanaflex [tizanidine hcl].  MEDICATIONS:  Current Outpatient Medications  Medication Sig Dispense Refill  . acetaminophen (TYLENOL) 650 MG CR tablet Take 650 mg by mouth every 8 (eight) hours as needed.      Marland Kitchen aspirin 81 MG tablet Take 81 mg by mouth daily.      . blood glucose meter kit and supplies KIT Dispense based on patient and insurance preference. Use up to twice daily as directed. 1 each 0  . Blood Glucose Monitoring Suppl (ACCU-CHEK GUIDE ME) w/Device KIT 1 each by Other route daily. 1 kit 0  . CALCIUM PO Take 1,000 mg by mouth daily.    . chlorthalidone (HYGROTON) 25 MG tablet TAKE 1 TABLET BY MOUTH EVERY DAY 90 tablet 1  . EPIPEN 2-PAK 0.3 MG/0.3ML SOAJ injection 0.3 mg. Reported on 05/01/2015  1  . fluticasone (FLONASE) 50 MCG/ACT nasal spray Place 1 spray into both nostrils daily. 16 g 5  . glucose blood (ACCU-CHEK GUIDE) test strip 1 each by Other route daily. 100 each 3  . Lancets MISC 1  each by Does not apply route daily. Use for Accu-Chek Guide 100 each 3  . Melatonin 10 MG TABS Take 1 tablet by mouth at bedtime.    . metFORMIN (GLUCOPHAGE) 500 MG tablet Take 1 tablet (500 mg total) by mouth 2 (two) times daily with a meal. 180 tablet 1  . Multiple Vitamin (MULTIVITAMIN) tablet Take 1 tablet by mouth daily.      . NON FORMULARY WEEKLY ALLERGY INJECTIONS     . pantoprazole (PROTONIX) 40 MG tablet TAKE 1 TABLET (40 MG TOTAL) BY MOUTH DAILY. 90 tablet 2  . potassium chloride SA (KLOR-CON M20) 20 MEQ tablet Take 1 tablet (20 mEq total) by mouth 2 (two) times daily. 180 tablet 6  . PROAIR HFA 108 (90 BASE) MCG/ACT inhaler Inhale 1 puff into the lungs every 6 (six) hours as needed.   0  . traMADol (ULTRAM) 50 MG tablet Take 1 tablet (50 mg total) by mouth 2 (two) times daily. 60 tablet 0  . traMADol (ULTRAM) 50 MG tablet Take 1 tablet (50 mg total) by mouth 2 (two) times daily as needed for severe pain. 60 tablet 0  .  traMADol (ULTRAM) 50 MG tablet Take 1 tablet (50 mg total) by mouth 2 (two) times daily. 60 tablet 0   No current facility-administered medications for this visit.     PHYSICAL EXAMINATION: ECOG PERFORMANCE STATUS: 1 - Symptomatic but completely ambulatory  Vitals:   12/04/18 0938  BP: (!) 148/77  Pulse: 91  Resp: 17  Temp: 98.2 F (36.8 C)  SpO2: 98%   Filed Weights   12/04/18 0938  Weight: 254 lb 11.2 oz (115.5 kg)    GENERAL:alert, no distress and comfortable SKIN: skin color, texture, turgor are normal, no rashes or significant lesions EYES: normal, Conjunctiva are pink and non-injected, sclera clear  NECK: supple, thyroid normal size, non-tender, without nodularity LYMPH:  no palpable lymphadenopathy in the cervical, axillary  LUNGS: clear to auscultation and percussion with normal breathing effort HEART: regular rate & rhythm and no murmurs and no lower extremity edema ABDOMEN:abdomen soft, non-tender and normal bowel sounds Musculoskeletal:no  cyanosis of digits and no clubbing  NEURO: alert & oriented x 3 with fluent speech, no focal motor/sensory deficits BREAST: S/p right lumpectomy: Surgical incision healed well. No palpable mass, nodules or adenopathy bilaterally. Breast exam benign.   LABORATORY DATA:  I have reviewed the data as listed CBC Latest Ref Rng & Units 05/09/2018 12/17/2017 09/01/2017  WBC 4.0 - 10.5 K/uL 6.2 - 5.7  Hemoglobin 12.0 - 15.0 g/dL 12.8 13.6 12.6  Hematocrit 36.0 - 46.0 % 38.2 40.0 37.7  Platelets 150.0 - 400.0 K/uL 195.0 - 174     CMP Latest Ref Rng & Units 05/09/2018 12/17/2017 09/01/2017  Glucose 70 - 99 mg/dL 160(H) 154(H) 124(H)  BUN 6 - 23 mg/dL 15 14 11   Creatinine 0.40 - 1.20 mg/dL 0.61 0.80 0.73  Sodium 135 - 145 mEq/L 140 144 142  Potassium 3.5 - 5.1 mEq/L 3.9 3.8 4.0  Chloride 96 - 112 mEq/L 100 106 105  CO2 19 - 32 mEq/L 32 - 28  Calcium 8.4 - 10.5 mg/dL 9.7 - 9.7  Total Protein 6.0 - 8.3 g/dL 7.3 - 7.6  Total Bilirubin 0.2 - 1.2 mg/dL 0.5 - 0.4  Alkaline Phos 39 - 117 U/L 69 - 85  AST 0 - 37 U/L 13 - 15  ALT 0 - 35 U/L 13 - 14      RADIOGRAPHIC STUDIES: I have personally reviewed the radiological images as listed and agreed with the findings in the report. No results found.   ASSESSMENT & PLAN:  SAN RUA is a 69 y.o. female with   1. right breast DCIS, ER/PR strongly positive -She was diagnosed in 10/2014. She is s/p right breast lumpectomy. She is cured. Any form of adjuvant treatment is for preventive  -Adjuvant radiation not recommended due to lack of residual malignancy at time of definitive surgery -Given her ER/PR positive disease, she started on anastrozole for chemoprevention in 03/2015 but stopped in July 2019 due to worsening arthralgia. Given her multiple comorbidities, I think the side effect of antiestrogen therapy is probably out weight the benefit a this point. I recommend her to stay off anastrozole. -After stopping anastrozole she still has arthralgia,  mainly in her left wrist. She is otherwise stable.  -From a breast cancer standpoint she is clinically doing well. Her physical exam and her 11/2017 mammogram were unremarkable. There is no clinical concern for recurrence. -Continue surveillance. Next mammogram in 12/08/2018 -I discussed given she is no longer on active treatment for DCIS her breast cancer surveillance can be  continued by her PCP with breast exams twice a year and yearly mammograms. She opted to continue seeing me for now.  -f/u in 1 year.    2. Bone Health -Repeated a bone density scan in February 2017 showed osteopenia, no high risk for fracture.  -She is on multiple vitamin which contains vitamin D thousand unit. I also encouraged her to take calcium 600 mg once daily.  -11/2017 DEXA improved and normal with lowest t-score -0.9 -She recently had constipation from calcium so she reduced to once a day and still takes Vit D.   3. Hypertension, diabetes, arthritis -Continue to follow up with her primary care physician -BP is elevated at 148/77 today (12/04/18)   4. hypokalemia -Secondary to chlorthalidone  -She'll continue potassium 1 tablet day, and try to eat potassium-rich foods such as bananas.  5. Obesity  -Continued to encourage exercise and diet. -Pt had previously lost some weight with exercise, but has gained some weight back recently.  -Weight is stable. She has not been exercising as much since COVID, but she still walks. I strongly encouraged her to continue .   6. Arthritis  -Currently mainly in left wrist -Did not improve with stopping anastrozole and can effect her sleep at times.    Plan -f/u in 1 year with Lacie, no lab  -Mammogram on 12/08/18, scheduled     No problem-specific Assessment & Plan notes found for this encounter.   No orders of the defined types were placed in this encounter.  All questions were answered. The patient knows to call the clinic with any problems, questions or  concerns. No barriers to learning was detected. I spent 15 minutes counseling the patient face to face. The total time spent in the appointment was 20 minutes and more than 50% was on counseling and review of test results     Truitt Merle, MD 12/04/2018   I, Joslyn Devon, am acting as scribe for Truitt Merle, MD.   I have reviewed the above documentation for accuracy and completeness, and I agree with the above.

## 2018-12-01 ENCOUNTER — Other Ambulatory Visit: Payer: Self-pay | Admitting: Internal Medicine

## 2018-12-01 DIAGNOSIS — G8929 Other chronic pain: Secondary | ICD-10-CM

## 2018-12-01 DIAGNOSIS — M545 Low back pain, unspecified: Secondary | ICD-10-CM

## 2018-12-04 ENCOUNTER — Telehealth: Payer: Self-pay | Admitting: Hematology

## 2018-12-04 ENCOUNTER — Encounter: Payer: Self-pay | Admitting: Hematology

## 2018-12-04 ENCOUNTER — Inpatient Hospital Stay: Payer: Medicare Other | Admitting: Hematology

## 2018-12-04 ENCOUNTER — Other Ambulatory Visit: Payer: Self-pay

## 2018-12-04 VITALS — BP 148/77 | HR 91 | Temp 98.2°F | Resp 17 | Ht 64.0 in | Wt 254.7 lb

## 2018-12-04 DIAGNOSIS — D0511 Intraductal carcinoma in situ of right breast: Secondary | ICD-10-CM | POA: Diagnosis not present

## 2018-12-04 DIAGNOSIS — Z79899 Other long term (current) drug therapy: Secondary | ICD-10-CM | POA: Diagnosis not present

## 2018-12-04 DIAGNOSIS — Z853 Personal history of malignant neoplasm of breast: Secondary | ICD-10-CM | POA: Diagnosis not present

## 2018-12-04 DIAGNOSIS — E669 Obesity, unspecified: Secondary | ICD-10-CM | POA: Diagnosis not present

## 2018-12-04 DIAGNOSIS — M1990 Unspecified osteoarthritis, unspecified site: Secondary | ICD-10-CM | POA: Diagnosis not present

## 2018-12-04 DIAGNOSIS — Z170 Estrogen receptor positive status [ER+]: Secondary | ICD-10-CM | POA: Diagnosis not present

## 2018-12-04 DIAGNOSIS — E119 Type 2 diabetes mellitus without complications: Secondary | ICD-10-CM | POA: Diagnosis not present

## 2018-12-04 DIAGNOSIS — I10 Essential (primary) hypertension: Secondary | ICD-10-CM | POA: Diagnosis not present

## 2018-12-04 DIAGNOSIS — E876 Hypokalemia: Secondary | ICD-10-CM | POA: Diagnosis not present

## 2018-12-04 DIAGNOSIS — M8580 Other specified disorders of bone density and structure, unspecified site: Secondary | ICD-10-CM

## 2018-12-04 DIAGNOSIS — Z923 Personal history of irradiation: Secondary | ICD-10-CM | POA: Diagnosis not present

## 2018-12-04 NOTE — Telephone Encounter (Signed)
Scheduled appt per 10/12 los. ° °Sent a staff message to get a calendar mailed out. °

## 2018-12-05 NOTE — Telephone Encounter (Signed)
Last filled 10/10/2018 Last OV 10/10/2018  Ok to fill? Order pended.

## 2018-12-07 ENCOUNTER — Other Ambulatory Visit: Payer: Self-pay | Admitting: Internal Medicine

## 2018-12-07 DIAGNOSIS — G8929 Other chronic pain: Secondary | ICD-10-CM

## 2018-12-07 DIAGNOSIS — M545 Low back pain, unspecified: Secondary | ICD-10-CM

## 2018-12-07 NOTE — Telephone Encounter (Signed)
Last office visit 10/10/2018.

## 2018-12-08 ENCOUNTER — Other Ambulatory Visit: Payer: Self-pay

## 2018-12-08 ENCOUNTER — Ambulatory Visit
Admission: RE | Admit: 2018-12-08 | Discharge: 2018-12-08 | Disposition: A | Discharge status: Home / Self Care | Payer: Medicare Other | Source: Ambulatory Visit | Attending: Hematology | Admitting: Hematology

## 2018-12-08 DIAGNOSIS — Z853 Personal history of malignant neoplasm of breast: Secondary | ICD-10-CM

## 2018-12-23 ENCOUNTER — Other Ambulatory Visit: Payer: Self-pay | Admitting: Internal Medicine

## 2019-01-06 ENCOUNTER — Other Ambulatory Visit: Payer: Self-pay | Admitting: Family Medicine

## 2019-01-06 ENCOUNTER — Other Ambulatory Visit: Payer: Self-pay | Admitting: Internal Medicine

## 2019-01-06 DIAGNOSIS — M545 Low back pain, unspecified: Secondary | ICD-10-CM

## 2019-01-06 DIAGNOSIS — G8929 Other chronic pain: Secondary | ICD-10-CM

## 2019-01-10 ENCOUNTER — Other Ambulatory Visit: Payer: Self-pay

## 2019-01-10 LAB — HM DIABETES EYE EXAM

## 2019-01-11 ENCOUNTER — Ambulatory Visit: Payer: Self-pay | Admitting: Internal Medicine

## 2019-01-11 ENCOUNTER — Encounter: Payer: Self-pay | Admitting: Internal Medicine

## 2019-01-11 ENCOUNTER — Ambulatory Visit: Payer: Medicare Other | Admitting: Internal Medicine

## 2019-01-11 ENCOUNTER — Other Ambulatory Visit: Payer: Self-pay | Admitting: *Deleted

## 2019-01-11 VITALS — BP 130/80 | HR 95 | Temp 97.3°F | Wt 251.7 lb

## 2019-01-11 DIAGNOSIS — M48061 Spinal stenosis, lumbar region without neurogenic claudication: Secondary | ICD-10-CM

## 2019-01-11 DIAGNOSIS — E785 Hyperlipidemia, unspecified: Secondary | ICD-10-CM

## 2019-01-11 DIAGNOSIS — E119 Type 2 diabetes mellitus without complications: Secondary | ICD-10-CM | POA: Diagnosis not present

## 2019-01-11 DIAGNOSIS — I10 Essential (primary) hypertension: Secondary | ICD-10-CM | POA: Diagnosis not present

## 2019-01-11 DIAGNOSIS — D0511 Intraductal carcinoma in situ of right breast: Secondary | ICD-10-CM | POA: Diagnosis not present

## 2019-01-11 DIAGNOSIS — M545 Low back pain, unspecified: Secondary | ICD-10-CM

## 2019-01-11 DIAGNOSIS — G8929 Other chronic pain: Secondary | ICD-10-CM

## 2019-01-11 LAB — POCT GLYCOSYLATED HEMOGLOBIN (HGB A1C): Hemoglobin A1C: 6.5 % — AB (ref 4.0–5.6)

## 2019-01-11 MED ORDER — METFORMIN HCL 500 MG PO TABS: 500.0000 mg | ORAL_TABLET | Freq: Two times a day (BID) | ORAL | 1 refills | Status: DC

## 2019-01-11 MED ORDER — PRAVASTATIN SODIUM 40 MG PO TABS: 40.0000 mg | ORAL_TABLET | Freq: Every day | ORAL | 3 refills | Status: DC

## 2019-01-11 MED ORDER — TRAMADOL HCL 50 MG PO TABS: 50.0000 mg | ORAL_TABLET | Freq: Two times a day (BID) | ORAL | 0 refills | Status: DC | PRN

## 2019-01-11 MED ORDER — TRAMADOL HCL 50 MG PO TABS: 50.0000 mg | ORAL_TABLET | Freq: Two times a day (BID) | ORAL | 0 refills | Status: DC

## 2019-01-11 NOTE — Patient Instructions (Signed)
-  Nice seeing you today!!  -Great job losing weight!  -Start pravastatin 40 mg daily for your cholesterol. Continue with diet and exercise as well.  -Schedule follow up in 3-4 months.

## 2019-01-11 NOTE — Progress Notes (Signed)
Established Patient Office Visit     CC/Reason for Visit: Follow-up chronic medical conditions  HPI: Darlene Mclaughlin is a 68 y.o. female who is coming in today for the above mentioned reasons. Past Medical History is significant for: Hypertension that has been well controlled on chlorthalidone, type 2 diabetes well controlled on Metformin, hyperlipidemia with a statin intolerance, GERD not on PPI therapy, chronic back pain maintained on tramadol.  She has no acute complaints today, she needs tramadol refills.   Past Medical/Surgical History: Past Medical History:  Diagnosis Date  . ALLERGIC RHINITIS 05/15/2008  . Anemia   . ASTHMA 05/15/2008  . Blood transfusion without reported diagnosis 1991   anemia  . Breast cancer ([place]) 2016   right breast  . Cancer ([place])    DCIS right breast  . Diabetes mellitus without complication ([place])   . GERD (gastroesophageal reflux disease)   . Headache(784.0) 05/15/2008  . HYPERTENSION 05/15/2008  . IMPAIRED GLUCOSE TOLERANCE 05/15/2008  . Obesity   . OSTEOARTHRITIS 05/15/2008   back    Past Surgical History:  Procedure Laterality Date  . ABDOMINAL HYSTERECTOMY    . BREAST LUMPECTOMY Right 12/17/2014  . BREAST LUMPECTOMY WITH RADIOACTIVE SEED LOCALIZATION Right 12/17/2014   Procedure: RIGHT BREAST RADIOACTIVE SEED GUIDED PARTIAL MASTECTOMY;  Surgeon: Erroll Luna, MD;  Location: [facility];  Service: General;  Laterality: Right;  . CESAREAN SECTION    . KNEE SURGERY     arthroscopic left    Social History:  reports that she has never smoked. She has never used smokeless tobacco. She reports current alcohol use. She reports that she does not use drugs.  Allergies: Allergies  Allergen Reactions  . Atorvastatin   . Hydrochlorothiazide   . Macrobid [Nitrofurantoin Monohyd Macro]      Liver problems  . Naproxen   . Simvastatin     Hallucinations, muscle pains  . Zanaflex [Tizanidine Hcl] Other (See Comments)   Liver problems    Family History:  Family History  Problem Relation Age of Onset  . Diabetes Mother   . Hypertension Mother   . Breast cancer Mother 91       Breast cancer twice   . Hyperlipidemia Father   . Colon cancer Brother        4 th brother  . Diabetes Brother   . Pancreatic cancer Brother        middle brother  . Stomach cancer Neg Hx      Current Outpatient Medications:  .  acetaminophen (TYLENOL) 650 MG CR tablet, Take 650 mg by mouth every 8 (eight) hours as needed.  , Disp: , Rfl:  .  aspirin 81 MG tablet, Take 81 mg by mouth daily.  , Disp: , Rfl:  .  blood glucose meter kit and supplies KIT, Dispense based on patient and insurance preference. Use up to twice daily as directed., Disp: 1 each, Rfl: 0 .  Blood Glucose Monitoring Suppl (ACCU-CHEK GUIDE ME) w/Device KIT, 1 each by Other route daily., Disp: 1 kit, Rfl: 0 .  CALCIUM PO, Take 1,000 mg by mouth daily., Disp: , Rfl:  .  chlorthalidone (HYGROTON) 25 MG tablet, TAKE 1 TABLET BY MOUTH EVERY DAY, Disp: 90 tablet, Rfl: 1 .  EPIPEN 2-PAK 0.3 MG/0.3ML SOAJ injection, 0.3 mg. Reported on 05/01/2015, Disp: , Rfl: 1 .  fluticasone (FLONASE) 50 MCG/ACT nasal spray, Place 1 spray into both nostrils daily., Disp: 16 g, Rfl: 5 .  glucose  blood (ACCU-CHEK GUIDE) test strip, 1 each by Other route daily., Disp: 100 each, Rfl: 3 .  Lancets MISC, 1 each by Does not apply route daily. Use for Accu-Chek Guide, Disp: 100 each, Rfl: 3 .  Melatonin 10 MG TABS, Take 1 tablet by mouth at bedtime., Disp: , Rfl:  .  metFORMIN (GLUCOPHAGE) 500 MG tablet, Take 1 tablet (500 mg total) by mouth 2 (two) times daily with a meal., Disp: 180 tablet, Rfl: 1 .  Multiple Vitamin (MULTIVITAMIN) tablet, Take 1 tablet by mouth daily.  , Disp: , Rfl:  .  NON FORMULARY, WEEKLY ALLERGY INJECTIONS , Disp: , Rfl:  .  pantoprazole (PROTONIX) 40 MG tablet, TAKE 1 TABLET (40 MG TOTAL) BY MOUTH DAILY., Disp: 90 tablet, Rfl: 2 .  potassium chloride SA (KLOR-CON  M20) 20 MEQ tablet, Take 1 tablet (20 mEq total) by mouth 2 (two) times daily. (Patient taking differently: Take 20 mEq by mouth daily. ), Disp: 180 tablet, Rfl: 6 .  PROAIR HFA 108 (90 BASE) MCG/ACT inhaler, Inhale 1 puff into the lungs every 6 (six) hours as needed. , Disp: , Rfl: 0 .  traMADol (ULTRAM) 50 MG tablet, Take 1 tablet (50 mg total) by mouth 2 (two) times daily., Disp: 60 tablet, Rfl: 0 .  traMADol (ULTRAM) 50 MG tablet, Take 1 tablet (50 mg total) by mouth 2 (two) times daily., Disp: 60 tablet, Rfl: 0 .  traMADol (ULTRAM) 50 MG tablet, Take 1 tablet (50 mg total) by mouth 2 (two) times daily as needed for severe pain., Disp: 60 tablet, Rfl: 0 .  pravastatin (PRAVACHOL) 40 MG tablet, Take 1 tablet (40 mg total) by mouth daily., Disp: 90 tablet, Rfl: 3  Review of Systems:  Constitutional: Denies fever, chills, diaphoresis, appetite change and fatigue.  HEENT: Denies photophobia, eye pain, redness, hearing loss, ear pain, congestion, sore throat, rhinorrhea, sneezing, mouth sores, trouble swallowing, neck pain, neck stiffness and tinnitus.   Respiratory: Denies SOB, DOE, cough, chest tightness,  and wheezing.   Cardiovascular: Denies chest pain, palpitations and leg swelling.  Gastrointestinal: Denies nausea, vomiting, abdominal pain, diarrhea, constipation, blood in stool and abdominal distention.  Genitourinary: Denies dysuria, urgency, frequency, hematuria, flank pain and difficulty urinating.  Endocrine: Denies: hot or cold intolerance, sweats, changes in hair or nails, polyuria, polydipsia. Musculoskeletal: Denies myalgias, back pain, joint swelling, arthralgias and gait problem.  Skin: Denies pallor, rash and wound.  Neurological: Denies dizziness, seizures, syncope, weakness, light-headedness, numbness and headaches.  Hematological: Denies adenopathy. Easy bruising, personal or family bleeding history  Psychiatric/Behavioral: Denies suicidal ideation, mood changes, confusion,  nervousness, sleep disturbance and agitation    Physical Exam: Vitals:   01/11/19 0841  BP: 130/80  Pulse: 95  Temp: (!) 97.3 F (36.3 C)  TempSrc: Temporal  SpO2: 96%  Weight: 251 lb 11.2 oz (114.2 kg)    Body mass index is 43.2 kg/m.   Constitutional: NAD, calm, comfortable Eyes: PERRL, lids and conjunctivae normal, wears corrective lenses ENMT: Mucous membranes are moist.  Neck: normal, supple, no masses, no thyromegaly Respiratory: clear to auscultation bilaterally, no wheezing, no crackles. Normal respiratory effort. No accessory muscle use.  Cardiovascular: Regular rate and rhythm, no murmurs / rubs / gallops. No extremity edema. 2+ pedal pulses.  Abdomen: no tenderness, no masses palpated. No hepatosplenomegaly. Bowel sounds positive.  Musculoskeletal: no clubbing / cyanosis. No joint deformity upper and lower extremities. Good ROM, no contractures. Normal muscle tone.  Skin: no rashes, lesions, ulcers. No induration  Neurologic: Grossly intact and nonfocal. Psychiatric: Normal judgment and insight. Alert and oriented x 3. Normal mood.    Impression and Plan:  Diabetes mellitus with coincident hypertension (HCC)  -A1c today is 6.5, demonstrating excellent control. -Continue Metformin.  Morbid obesity ([place]) -Discussed healthy lifestyle, including increased physical activity and better food choices to promote weight loss.  Ductal carcinoma in situ (DCIS) of right breast -Followed by oncology, now off anastrozole.  Essential hypertension -Well-controlled on chlorthalidone.  Dyslipidemia  -Last LDL was 137 in March 2020, goal is less than 70. -She has had intolerance to atorvastatin and simvastatin.  Will try pravastatin today, she knows to notify us if any issues.  Chronic midline low back pain, unspecified whether sciatica present Spinal stenosis of lumbar region, unspecified whether neurogenic claudication present -PDMP reviewed, no red flags, overdose or  score is 160, refill tramadol x3 months today.     Patient Instructions  -Nice seeing you today!!  -Great job losing weight!  -Start pravastatin 40 mg daily for your cholesterol. Continue with diet and exercise as well.  -Schedule follow up in 3-4 months.       Lelon Frohlich, MD [facility]

## 2019-01-12 ENCOUNTER — Encounter: Payer: Self-pay | Admitting: Internal Medicine

## 2019-02-27 DIAGNOSIS — J301 Allergic rhinitis due to pollen: Secondary | ICD-10-CM | POA: Diagnosis not present

## 2019-02-27 DIAGNOSIS — J3089 Other allergic rhinitis: Secondary | ICD-10-CM | POA: Diagnosis not present

## 2019-03-13 DIAGNOSIS — J301 Allergic rhinitis due to pollen: Secondary | ICD-10-CM | POA: Diagnosis not present

## 2019-03-13 DIAGNOSIS — J3089 Other allergic rhinitis: Secondary | ICD-10-CM | POA: Diagnosis not present

## 2019-03-20 ENCOUNTER — Ambulatory Visit: Payer: Medicare Other

## 2019-03-26 DIAGNOSIS — J3089 Other allergic rhinitis: Secondary | ICD-10-CM | POA: Diagnosis not present

## 2019-03-26 DIAGNOSIS — J301 Allergic rhinitis due to pollen: Secondary | ICD-10-CM | POA: Diagnosis not present

## 2019-03-29 ENCOUNTER — Ambulatory Visit: Payer: Medicare PPO | Attending: Internal Medicine

## 2019-03-29 DIAGNOSIS — Z23 Encounter for immunization: Secondary | ICD-10-CM | POA: Insufficient documentation

## 2019-03-29 NOTE — Progress Notes (Signed)
   Covid-19 Vaccination Clinic  Name:  TOI SEBASTIAN    MRN: [id number] DOB: [birth year]  03/29/2019  Ms. Porges was observed post Covid-19 immunization for 15 minutes without incidence. She was provided with Vaccine Information Sheet and instruction to access the V-Safe system.   Ms. Poepping was instructed to call 911 with any severe reactions post vaccine: Marland Kitchen Difficulty breathing  . Swelling of your face and throat  . A fast heartbeat  . A bad rash all over your body  . Dizziness and weakness    Immunizations Administered    Name Date Dose VIS Date Route   Pfizer COVID-19 Vaccine 03/29/2019  5:05 PM 0.3 mL 02/02/2019 Intramuscular   Manufacturer: [place]   Lot: [id number]   [place]: [id number]

## 2019-04-10 DIAGNOSIS — J301 Allergic rhinitis due to pollen: Secondary | ICD-10-CM | POA: Diagnosis not present

## 2019-04-10 DIAGNOSIS — J3089 Other allergic rhinitis: Secondary | ICD-10-CM | POA: Diagnosis not present

## 2019-04-17 ENCOUNTER — Other Ambulatory Visit: Payer: Self-pay | Admitting: Internal Medicine

## 2019-04-17 DIAGNOSIS — G8929 Other chronic pain: Secondary | ICD-10-CM

## 2019-04-17 DIAGNOSIS — M545 Low back pain, unspecified: Secondary | ICD-10-CM

## 2019-04-24 ENCOUNTER — Ambulatory Visit: Payer: Medicare PPO | Attending: Internal Medicine

## 2019-04-24 DIAGNOSIS — Z23 Encounter for immunization: Secondary | ICD-10-CM | POA: Insufficient documentation

## 2019-04-24 NOTE — Progress Notes (Signed)
   Covid-19 Vaccination Clinic  Name:  Darlene Mclaughlin    MRN: [id number] DOB: 02-07-1951  04/24/2019  [name] was observed post Covid-19 immunization for 15 minutes without incident. She was provided with Vaccine Information Sheet and instruction to access the V-Safe system.   [name] was instructed to call 911 with any severe reactions post vaccine: Marland Kitchen Difficulty breathing  . Swelling of face and throat  . A fast heartbeat  . A bad rash all over body  . Dizziness and weakness   Immunizations Administered    Name Date Dose VIS Date Route   Pfizer COVID-19 Vaccine 04/24/2019  9:52 AM 0.3 mL 02/02/2019 Intramuscular   Manufacturer: [place]   Lot: [id number]   [place]: [id number]

## 2019-05-08 DIAGNOSIS — J301 Allergic rhinitis due to pollen: Secondary | ICD-10-CM | POA: Diagnosis not present

## 2019-05-08 DIAGNOSIS — J3089 Other allergic rhinitis: Secondary | ICD-10-CM | POA: Diagnosis not present

## 2019-05-15 DIAGNOSIS — J3089 Other allergic rhinitis: Secondary | ICD-10-CM | POA: Diagnosis not present

## 2019-05-15 DIAGNOSIS — J301 Allergic rhinitis due to pollen: Secondary | ICD-10-CM | POA: Diagnosis not present

## 2019-05-17 ENCOUNTER — Other Ambulatory Visit: Payer: Self-pay | Admitting: Family

## 2019-05-17 DIAGNOSIS — M545 Low back pain, unspecified: Secondary | ICD-10-CM

## 2019-05-17 DIAGNOSIS — G8929 Other chronic pain: Secondary | ICD-10-CM

## 2019-05-18 DIAGNOSIS — J301 Allergic rhinitis due to pollen: Secondary | ICD-10-CM | POA: Diagnosis not present

## 2019-05-18 DIAGNOSIS — J3089 Other allergic rhinitis: Secondary | ICD-10-CM | POA: Diagnosis not present

## 2019-05-22 DIAGNOSIS — J301 Allergic rhinitis due to pollen: Secondary | ICD-10-CM | POA: Diagnosis not present

## 2019-05-22 DIAGNOSIS — J3089 Other allergic rhinitis: Secondary | ICD-10-CM | POA: Diagnosis not present

## 2019-05-23 ENCOUNTER — Telehealth: Payer: Self-pay | Admitting: Internal Medicine

## 2019-05-23 DIAGNOSIS — G8929 Other chronic pain: Secondary | ICD-10-CM

## 2019-05-23 DIAGNOSIS — M545 Low back pain, unspecified: Secondary | ICD-10-CM

## 2019-05-23 NOTE — Telephone Encounter (Signed)
Pt call and stated [facility] said they sent a refill request on the 05/17/19/ for traMADol (ULTRAM) 50 MG tablet  and haven't got answer / would like this sent [facility]  [id number] [address], [place] Phone:  [phone]  Fax:  [phone]

## 2019-05-24 DIAGNOSIS — J3081 Allergic rhinitis due to animal (cat) (dog) hair and dander: Secondary | ICD-10-CM | POA: Diagnosis not present

## 2019-05-24 DIAGNOSIS — J3089 Other allergic rhinitis: Secondary | ICD-10-CM | POA: Diagnosis not present

## 2019-05-24 DIAGNOSIS — J301 Allergic rhinitis due to pollen: Secondary | ICD-10-CM | POA: Diagnosis not present

## 2019-06-01 MED ORDER — TRAMADOL HCL 50 MG PO TABS: 50.0000 mg | ORAL_TABLET | Freq: Two times a day (BID) | ORAL | 2 refills | Status: DC | PRN

## 2019-06-01 NOTE — Telephone Encounter (Signed)
Pt called about prescription advised that it was sent to pharmacy on 05/17/19. Pharmacy is informing pt that they did not received it. Please follow up with pt to advise prescription has been sent and received by pharmacy.    Phone: [phone]

## 2019-06-01 NOTE — Addendum Note (Signed)
Addended by: Westley Hummer B on: 06/01/2019 05:23 PM   Modules accepted: Orders

## 2019-06-01 NOTE — Telephone Encounter (Signed)
Refills called in 

## 2019-06-06 DIAGNOSIS — J301 Allergic rhinitis due to pollen: Secondary | ICD-10-CM | POA: Diagnosis not present

## 2019-06-06 DIAGNOSIS — J3089 Other allergic rhinitis: Secondary | ICD-10-CM | POA: Diagnosis not present

## 2019-06-19 DIAGNOSIS — J3089 Other allergic rhinitis: Secondary | ICD-10-CM | POA: Diagnosis not present

## 2019-06-19 DIAGNOSIS — J301 Allergic rhinitis due to pollen: Secondary | ICD-10-CM | POA: Diagnosis not present

## 2019-07-02 DIAGNOSIS — J3089 Other allergic rhinitis: Secondary | ICD-10-CM | POA: Diagnosis not present

## 2019-07-02 DIAGNOSIS — J301 Allergic rhinitis due to pollen: Secondary | ICD-10-CM | POA: Diagnosis not present

## 2019-07-12 ENCOUNTER — Other Ambulatory Visit: Payer: Self-pay | Admitting: Internal Medicine

## 2019-07-17 DIAGNOSIS — J3089 Other allergic rhinitis: Secondary | ICD-10-CM | POA: Diagnosis not present

## 2019-07-17 DIAGNOSIS — J301 Allergic rhinitis due to pollen: Secondary | ICD-10-CM | POA: Diagnosis not present

## 2019-07-30 ENCOUNTER — Other Ambulatory Visit: Payer: Self-pay | Admitting: Family Medicine

## 2019-07-30 DIAGNOSIS — J301 Allergic rhinitis due to pollen: Secondary | ICD-10-CM | POA: Diagnosis not present

## 2019-07-30 DIAGNOSIS — J3089 Other allergic rhinitis: Secondary | ICD-10-CM | POA: Diagnosis not present

## 2019-08-13 DIAGNOSIS — H1045 Other chronic allergic conjunctivitis: Secondary | ICD-10-CM | POA: Diagnosis not present

## 2019-08-13 DIAGNOSIS — J3089 Other allergic rhinitis: Secondary | ICD-10-CM | POA: Diagnosis not present

## 2019-08-13 DIAGNOSIS — J4520 Mild intermittent asthma, uncomplicated: Secondary | ICD-10-CM | POA: Diagnosis not present

## 2019-08-13 DIAGNOSIS — J301 Allergic rhinitis due to pollen: Secondary | ICD-10-CM | POA: Diagnosis not present

## 2019-08-29 DIAGNOSIS — J3089 Other allergic rhinitis: Secondary | ICD-10-CM | POA: Diagnosis not present

## 2019-08-29 DIAGNOSIS — J301 Allergic rhinitis due to pollen: Secondary | ICD-10-CM | POA: Diagnosis not present

## 2019-09-11 DIAGNOSIS — J301 Allergic rhinitis due to pollen: Secondary | ICD-10-CM | POA: Diagnosis not present

## 2019-09-11 DIAGNOSIS — J3089 Other allergic rhinitis: Secondary | ICD-10-CM | POA: Diagnosis not present

## 2019-09-12 ENCOUNTER — Other Ambulatory Visit: Payer: Self-pay | Admitting: Internal Medicine

## 2019-09-12 DIAGNOSIS — M545 Low back pain, unspecified: Secondary | ICD-10-CM

## 2019-09-12 DIAGNOSIS — G8929 Other chronic pain: Secondary | ICD-10-CM

## 2019-09-13 ENCOUNTER — Telehealth: Payer: Self-pay | Admitting: Internal Medicine

## 2019-09-13 NOTE — Telephone Encounter (Signed)
I believe this is the tramadol that we denied due to lack of pain contract and follow up...can you confirm?

## 2019-09-13 NOTE — Telephone Encounter (Signed)
Pt is calling in stating that she needs a refill on her tramadol (ULTRAM) 50 MG   [facility] [address].  Pt has a CPE visit on 11/29/2019 with Dr. Jerilee Hoh.

## 2019-09-14 NOTE — Telephone Encounter (Signed)
Confirmed.  Spoke with patient and an appointment scheduled.

## 2019-09-18 ENCOUNTER — Encounter: Payer: Self-pay | Admitting: Internal Medicine

## 2019-09-18 ENCOUNTER — Other Ambulatory Visit: Payer: Self-pay

## 2019-09-18 ENCOUNTER — Ambulatory Visit: Payer: Medicare PPO | Admitting: Internal Medicine

## 2019-09-18 VITALS — BP 120/70 | HR 84 | Temp 98.6°F | Ht 64.0 in | Wt 254.5 lb

## 2019-09-18 DIAGNOSIS — Z800 Family history of malignant neoplasm of digestive organs: Secondary | ICD-10-CM

## 2019-09-18 DIAGNOSIS — D0511 Intraductal carcinoma in situ of right breast: Secondary | ICD-10-CM

## 2019-09-18 DIAGNOSIS — G8929 Other chronic pain: Secondary | ICD-10-CM | POA: Diagnosis not present

## 2019-09-18 DIAGNOSIS — E785 Hyperlipidemia, unspecified: Secondary | ICD-10-CM | POA: Diagnosis not present

## 2019-09-18 DIAGNOSIS — E119 Type 2 diabetes mellitus without complications: Secondary | ICD-10-CM | POA: Diagnosis not present

## 2019-09-18 DIAGNOSIS — I10 Essential (primary) hypertension: Secondary | ICD-10-CM | POA: Diagnosis not present

## 2019-09-18 DIAGNOSIS — M545 Low back pain, unspecified: Secondary | ICD-10-CM

## 2019-09-18 LAB — POCT GLYCOSYLATED HEMOGLOBIN (HGB A1C): Hemoglobin A1C: 6.9 % — AB (ref 4.0–5.6)

## 2019-09-18 MED ORDER — TRAMADOL HCL 50 MG PO TABS: 50.0000 mg | ORAL_TABLET | Freq: Two times a day (BID) | ORAL | 2 refills | Status: DC | PRN

## 2019-09-18 NOTE — Addendum Note (Signed)
Addended by: Marrion Coy on: 09/18/2019 09:09 AM   Modules accepted: Orders

## 2019-09-18 NOTE — Patient Instructions (Signed)
-  Nice seeing you today!!  -Lab work today; will notify you once results are available.  -Bone density test requested today.  -I will see you back in 3 months.

## 2019-09-18 NOTE — Progress Notes (Signed)
Established Patient Office Visit     This visit occurred during the SARS-CoV-2 public health emergency.  Safety protocols were in place, including screening questions prior to the visit, additional usage of staff PPE, and extensive cleaning of exam room while observing appropriate contact time as indicated for disinfecting solutions.    CC/Reason for Visit: Follow-up chronic conditions  HPI: Darlene Mclaughlin is a 69 y.o. female who is coming in today for the above mentioned reasons. Past Medical History is significant for: Hypertension that has been well controlled on chlorthalidone, type 2 diabetes well controlled on Metformin, hyperlipidemia with a statin intolerance, GERD not on PPI therapy, chronic back pain maintained on tramadol.  She has no acute complaints today, she needs tramadol refills.  She has routine eye and dental care.  She has had both Covid vaccines.   Past Medical/Surgical History: Past Medical History:  Diagnosis Date  . ALLERGIC RHINITIS 05/15/2008  . Anemia   . ASTHMA 05/15/2008  . Blood transfusion without reported diagnosis 1991   anemia  . Breast cancer ([place]) 2016   right breast  . Cancer ([place])    DCIS right breast  . Diabetes mellitus without complication ([place])   . GERD (gastroesophageal reflux disease)   . Headache(784.0) 05/15/2008  . HYPERTENSION 05/15/2008  . IMPAIRED GLUCOSE TOLERANCE 05/15/2008  . Obesity   . OSTEOARTHRITIS 05/15/2008   back    Past Surgical History:  Procedure Laterality Date  . ABDOMINAL HYSTERECTOMY    . BREAST LUMPECTOMY Right 12/17/2014  . BREAST LUMPECTOMY WITH RADIOACTIVE SEED LOCALIZATION Right 12/17/2014   Procedure: RIGHT BREAST RADIOACTIVE SEED GUIDED PARTIAL MASTECTOMY;  Surgeon: Erroll Luna, MD;  Location: [facility];  Service: General;  Laterality: Right;  . CESAREAN SECTION    . KNEE SURGERY     arthroscopic left    Social History:  reports that she has never smoked. She has never used  smokeless tobacco. She reports current alcohol use. She reports that she does not use drugs.  Allergies: Allergies  Allergen Reactions  . Atorvastatin   . Hydrochlorothiazide   . Macrobid [Nitrofurantoin Monohyd Macro]      Liver problems  . Naproxen   . Simvastatin     Hallucinations, muscle pains  . Zanaflex [Tizanidine Hcl] Other (See Comments)     Liver problems    Family History:  Family History  Problem Relation Age of Onset  . Diabetes Mother   . Hypertension Mother   . Breast cancer Mother 20       Breast cancer twice   . Hyperlipidemia Father   . Colon cancer Brother        4 th brother  . Diabetes Brother   . Pancreatic cancer Brother        middle brother  . Stomach cancer Neg Hx      Current Outpatient Medications:  .  acetaminophen (TYLENOL) 650 MG CR tablet, Take 650 mg by mouth every 8 (eight) hours as needed.  , Disp: , Rfl:  .  aspirin 81 MG tablet, Take 81 mg by mouth daily.  , Disp: , Rfl:  .  blood glucose meter kit and supplies KIT, Dispense based on patient and insurance preference. Use up to twice daily as directed., Disp: 1 each, Rfl: 0 .  Blood Glucose Monitoring Suppl (ACCU-CHEK GUIDE ME) w/Device KIT, 1 each by Other route daily., Disp: 1 kit, Rfl: 0 .  CALCIUM PO, Take 1,000 mg by mouth daily.,  Disp: , Rfl:  .  chlorthalidone (HYGROTON) 25 MG tablet, TAKE 1 TABLET BY MOUTH EVERY DAY, Disp: 90 tablet, Rfl: 1 .  EPIPEN 2-PAK 0.3 MG/0.3ML SOAJ injection, 0.3 mg. Reported on 05/01/2015, Disp: , Rfl: 1 .  fluticasone (FLONASE) 50 MCG/ACT nasal spray, Place 1 spray into both nostrils daily., Disp: 16 g, Rfl: 5 .  glucose blood (ACCU-CHEK GUIDE) test strip, 1 each by Other route daily., Disp: 100 each, Rfl: 3 .  Lancets MISC, 1 each by Does not apply route daily. Use for Accu-Chek Guide, Disp: 100 each, Rfl: 3 .  Melatonin 10 MG TABS, Take 1 tablet by mouth at bedtime., Disp: , Rfl:  .  metFORMIN (GLUCOPHAGE) 500 MG tablet, Take 1 tablet (500 mg total)  by mouth 2 (two) times daily with a meal., Disp: 180 tablet, Rfl: 1 .  Multiple Vitamin (MULTIVITAMIN) tablet, Take 1 tablet by mouth daily.  , Disp: , Rfl:  .  NON FORMULARY, WEEKLY ALLERGY INJECTIONS , Disp: , Rfl:  .  pantoprazole (PROTONIX) 40 MG tablet, TAKE 1 TABLET (40 MG TOTAL) BY MOUTH DAILY., Disp: 90 tablet, Rfl: 2 .  potassium chloride SA (KLOR-CON M20) 20 MEQ tablet, Take 1 tablet (20 mEq total) by mouth 2 (two) times daily. (Patient taking differently: Take 20 mEq by mouth daily. ), Disp: 180 tablet, Rfl: 6 .  pravastatin (PRAVACHOL) 40 MG tablet, Take 1 tablet (40 mg total) by mouth daily., Disp: 90 tablet, Rfl: 3 .  PROAIR HFA 108 (90 BASE) MCG/ACT inhaler, Inhale 1 puff into the lungs every 6 (six) hours as needed. , Disp: , Rfl: 0 .  traMADol (ULTRAM) 50 MG tablet, Take 1 tablet (50 mg total) by mouth 2 (two) times daily as needed for severe pain., Disp: 60 tablet, Rfl: 2  Review of Systems:  Constitutional: Denies fever, chills, diaphoresis, appetite change and fatigue.  HEENT: Denies photophobia, eye pain, redness, hearing loss, ear pain, congestion, sore throat, rhinorrhea, sneezing, mouth sores, trouble swallowing, neck pain, neck stiffness and tinnitus.   Respiratory: Denies SOB, DOE, cough, chest tightness,  and wheezing.   Cardiovascular: Denies chest pain, palpitations and leg swelling.  Gastrointestinal: Denies nausea, vomiting, abdominal pain, diarrhea, constipation, blood in stool and abdominal distention.  Genitourinary: Denies dysuria, urgency, frequency, hematuria, flank pain and difficulty urinating.  Endocrine: Denies: hot or cold intolerance, sweats, changes in hair or nails, polyuria, polydipsia. Musculoskeletal: Denies myalgias, back pain, joint swelling, arthralgias and gait problem.  Skin: Denies pallor, rash and wound.  Neurological: Denies dizziness, seizures, syncope, weakness, light-headedness, numbness and headaches.  Hematological: Denies adenopathy.  Easy bruising, personal or family bleeding history  Psychiatric/Behavioral: Denies suicidal ideation, mood changes, confusion, nervousness, sleep disturbance and agitation    Physical Exam: Vitals:   09/18/19 0838  BP: 120/70  Pulse: 84  Temp: 98.6 F (37 C)  TempSrc: Oral  SpO2: 96%  Weight: (!) 254 lb 8 oz (115.4 kg)  Height: 5' 4" (1.626 m)    Body mass index is 43.68 kg/m.   Constitutional: NAD, calm, comfortable Eyes: PERRL, lids and conjunctivae normal, wears corrective lenses ENMT: Mucous membranes are moist.  Respiratory: clear to auscultation bilaterally, no wheezing, no crackles. Normal respiratory effort. No accessory muscle use.  Cardiovascular: Regular rate and rhythm, no murmurs / rubs / gallops. No extremity edema.  Neurologic: Grossly intact and nonfocal Psychiatric: Normal judgment and insight. Alert and oriented x 3. Normal mood.    Impression and Plan:  Diabetes mellitus with coincident  hypertension (HCC)  -A1c is well controlled at 6.9 today. -Continue current regimen.  Chronic midline low back pain, unspecified whether sciatica present -PDMP reviewed, overdose risk score 180.  She has 1 red flag for greater than 5 providers over the past 2 years however all of these prescriptions have been by myself or a covering physician for me in the office. -Refill tramadol 50 mg to take twice daily as needed limited to 60 tablets/month x 3 months.  Essential hypertension  -Well-controlled, continue current regimen.  Dyslipidemia  -Last LDL was not at goal at 137 in March 2020. -Recheck lipids today. -She is on pravastatin 40 mg.  Family history of colon cancer -Last colonoscopy in 2018, she is a 5-year callback.  Morbid obesity ([place]) -Discussed healthy lifestyle, including increased physical activity and better food choices to promote weight loss.  Ductal carcinoma in situ (DCIS) of right breast -At this point she is no longer on therapy, she has  annual mammograms in October.    Patient Instructions  -Nice seeing you today!!  -Lab work today; will notify you once results are available.  -Bone density test requested today.  -I will see you back in 3 months.     Lelon Frohlich, MD [facility]

## 2019-09-19 LAB — CBC WITH DIFFERENTIAL/PLATELET
Absolute Monocytes: 533 cells/uL (ref 200–950)
Basophils Absolute: 43 cells/uL (ref 0–200)
Basophils Relative: 0.6 %
Eosinophils Absolute: 151 cells/uL (ref 15–500)
Eosinophils Relative: 2.1 %
HCT: 38.2 % (ref 35.0–45.0)
Hemoglobin: 12.8 g/dL (ref 11.7–15.5)
Lymphs Abs: 2066 cells/uL (ref 850–3900)
MCH: 30.5 pg (ref 27.0–33.0)
MCHC: 33.5 g/dL (ref 32.0–36.0)
MCV: 91.2 fL (ref 80.0–100.0)
MPV: 10.7 fL (ref 7.5–12.5)
Monocytes Relative: 7.4 %
Neutro Abs: 4406 cells/uL (ref 1500–7800)
Neutrophils Relative %: 61.2 %
Platelets: 196 10*3/uL (ref 140–400)
RBC: 4.19 10*6/uL (ref 3.80–5.10)
RDW: 13 % (ref 11.0–15.0)
Total Lymphocyte: 28.7 %
WBC: 7.2 10*3/uL (ref 3.8–10.8)

## 2019-09-19 LAB — MICROALBUMIN / CREATININE URINE RATIO
Creatinine, Urine: 32 mg/dL (ref 20–275)
Microalb Creat Ratio: 6 mcg/mg creat (ref <30)
Microalb, Ur: 0.2 mg/dL

## 2019-09-19 LAB — COMPREHENSIVE METABOLIC PANEL
AG Ratio: 1.3 (calc) (ref 1.0–2.5)
ALT: 13 U/L (ref 6–29)
AST: 15 U/L (ref 10–35)
Albumin: 4.2 g/dL (ref 3.6–5.1)
Alkaline phosphatase (APISO): 66 U/L (ref 37–153)
BUN: 14 mg/dL (ref 7–25)
CO2: 28 mmol/L (ref 20–32)
Calcium: 9.4 mg/dL (ref 8.6–10.4)
Chloride: 100 mmol/L (ref 98–110)
Creat: 0.61 mg/dL (ref 0.60–0.93)
Globulin: 3.2 g/dL (calc) (ref 1.9–3.7)
Glucose, Bld: 138 mg/dL — ABNORMAL HIGH (ref 65–99)
Potassium: 3.5 mmol/L (ref 3.5–5.3)
Sodium: 140 mmol/L (ref 135–146)
Total Bilirubin: 0.4 mg/dL (ref 0.2–1.2)
Total Protein: 7.4 g/dL (ref 6.1–8.1)

## 2019-09-19 LAB — HEMOGLOBIN A1C
Hgb A1c MFr Bld: 7.1 % of total Hgb — ABNORMAL HIGH (ref <5.7)
Mean Plasma Glucose: 157 (calc)
eAG (mmol/L): 8.7 (calc)

## 2019-09-19 LAB — TSH: TSH: 1.68 mIU/L (ref 0.40–4.50)

## 2019-09-19 LAB — LIPID PANEL
Cholesterol: 196 mg/dL (ref <200)
HDL: 50 mg/dL (ref >=50)
LDL Cholesterol (Calc): 122 mg/dL (calc) — ABNORMAL HIGH
Non-HDL Cholesterol (Calc): 146 mg/dL (calc) — ABNORMAL HIGH (ref <130)
Total CHOL/HDL Ratio: 3.9 (calc) (ref <5.0)
Triglycerides: 129 mg/dL (ref <150)

## 2019-09-19 LAB — VITAMIN D 25 HYDROXY (VIT D DEFICIENCY, FRACTURES): Vit D, 25-Hydroxy: 30 ng/mL (ref 30–100)

## 2019-09-19 LAB — VITAMIN B12: Vitamin B-12: 691 pg/mL (ref 200–1100)

## 2019-09-25 DIAGNOSIS — J301 Allergic rhinitis due to pollen: Secondary | ICD-10-CM | POA: Diagnosis not present

## 2019-09-25 DIAGNOSIS — J3089 Other allergic rhinitis: Secondary | ICD-10-CM | POA: Diagnosis not present

## 2019-10-11 DIAGNOSIS — J3081 Allergic rhinitis due to animal (cat) (dog) hair and dander: Secondary | ICD-10-CM | POA: Diagnosis not present

## 2019-10-11 DIAGNOSIS — J3089 Other allergic rhinitis: Secondary | ICD-10-CM | POA: Diagnosis not present

## 2019-10-14 ENCOUNTER — Other Ambulatory Visit: Payer: Self-pay | Admitting: Internal Medicine

## 2019-10-22 DIAGNOSIS — J3089 Other allergic rhinitis: Secondary | ICD-10-CM | POA: Diagnosis not present

## 2019-10-22 DIAGNOSIS — J301 Allergic rhinitis due to pollen: Secondary | ICD-10-CM | POA: Diagnosis not present

## 2019-10-26 ENCOUNTER — Other Ambulatory Visit: Payer: Self-pay | Admitting: Hematology

## 2019-10-26 DIAGNOSIS — Z853 Personal history of malignant neoplasm of breast: Secondary | ICD-10-CM

## 2019-10-26 DIAGNOSIS — Z9889 Other specified postprocedural states: Secondary | ICD-10-CM

## 2019-11-05 DIAGNOSIS — J3089 Other allergic rhinitis: Secondary | ICD-10-CM | POA: Diagnosis not present

## 2019-11-05 DIAGNOSIS — J301 Allergic rhinitis due to pollen: Secondary | ICD-10-CM | POA: Diagnosis not present

## 2019-11-12 ENCOUNTER — Ambulatory Visit: Payer: Medicare PPO | Admitting: Internal Medicine

## 2019-11-12 ENCOUNTER — Ambulatory Visit (HOSPITAL_COMMUNITY)
Admission: RE | Admit: 2019-11-12 | Discharge: 2019-11-12 | Disposition: A | Discharge status: Home / Self Care | Payer: Medicare PPO | Source: Ambulatory Visit | Attending: Internal Medicine | Admitting: Internal Medicine

## 2019-11-12 ENCOUNTER — Telehealth: Payer: Self-pay | Admitting: Internal Medicine

## 2019-11-12 ENCOUNTER — Encounter: Payer: Self-pay | Admitting: Internal Medicine

## 2019-11-12 ENCOUNTER — Other Ambulatory Visit: Payer: Self-pay

## 2019-11-12 VITALS — BP 148/82 | HR 103 | Temp 98.7°F | Ht 64.0 in | Wt 248.8 lb

## 2019-11-12 DIAGNOSIS — R202 Paresthesia of skin: Secondary | ICD-10-CM | POA: Diagnosis not present

## 2019-11-12 DIAGNOSIS — E119 Type 2 diabetes mellitus without complications: Secondary | ICD-10-CM | POA: Diagnosis not present

## 2019-11-12 DIAGNOSIS — M79651 Pain in right thigh: Secondary | ICD-10-CM

## 2019-11-12 DIAGNOSIS — R1031 Right lower quadrant pain: Secondary | ICD-10-CM

## 2019-11-12 DIAGNOSIS — I10 Essential (primary) hypertension: Secondary | ICD-10-CM | POA: Insufficient documentation

## 2019-11-12 DIAGNOSIS — M549 Dorsalgia, unspecified: Secondary | ICD-10-CM

## 2019-11-12 LAB — POCT URINALYSIS DIPSTICK
Bilirubin, UA: NEGATIVE
Blood, UA: NEGATIVE
Glucose, UA: NEGATIVE
Ketones, UA: NEGATIVE
Leukocytes, UA: NEGATIVE
Nitrite, UA: NEGATIVE
Protein, UA: NEGATIVE
Spec Grav, UA: 1.01 (ref 1.010–1.025)
Urobilinogen, UA: 0.2 E.U./dL
pH, UA: 7 (ref 5.0–8.0)

## 2019-11-12 MED ORDER — GABAPENTIN 100 MG PO CAPS: 100.0000 mg | ORAL_CAPSULE | Freq: Every day | ORAL | 0 refills | Status: DC

## 2019-11-12 NOTE — Telephone Encounter (Signed)
[facility] to say the preliminary  report will be in Epic and she is letting the pt go. To follow up with the pt with any questions.

## 2019-11-12 NOTE — Telephone Encounter (Signed)
Please see message. °

## 2019-11-12 NOTE — Progress Notes (Signed)
Chief Complaint  Patient presents with  . Leg Pain    right thigh pain, started 2 weeks ago in both feet and tingling, sharp throbbing pain and worse when laying down    HPI: Darlene Mclaughlin 69 y.o. come in for sda   pcp NA today  Similar to pain from 2019  Left side form allergic reaction  From a  med ? 2 weeks ago ringling and buring and itching area  And rub area .  A bit better  Then aching   And thigh and up and side hip groin   cotnious pain  Tramadol doesn work for this   And worse when laying dwon at night and also walking    Feel getting worse  Throbbing pain ,   No fall but  Sept 7the had almost fall. Slid on step raining.  No sx.  ROS: See pertinent positives and negatives per HPI.  Past Medical History:  Diagnosis Date  . ALLERGIC RHINITIS 05/15/2008  . Anemia   . ASTHMA 05/15/2008  . Blood transfusion without reported diagnosis 1991   anemia  . Breast cancer ([place]) 2016   right breast  . 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 Diabetes mellitus without complication ([place])   . GERD (gastroesophageal reflux disease)   . Headache(784.0) 05/15/2008  . HYPERTENSION 05/15/2008  . IMPAIRED GLUCOSE TOLERANCE 05/15/2008  . Obesity   . OSTEOARTHRITIS 05/15/2008   back    Family History  Problem Relation Age of Onset  . Diabetes Mother   . Hypertension Mother   . Breast cancer Mother 63       Breast cancer twice   . Hyperlipidemia Father   . Colon cancer Brother        4 th brother  . Diabetes Brother   . Pancreatic cancer Brother        middle brother  . Stomach cancer Neg Hx     Social History   Socioeconomic History  . Marital status: Widowed    Spouse name: Not on file  . Number of children: Not on file  . Years of education: Not on file  . Highest education level: Not on file  Occupational History  . Occupation: Retired  Tobacco Use  . Smoking status: Never Smoker  . Smokeless tobacco: Never Used  Vaping Use  . Vaping Use: Never used  Substance and  Sexual Activity  . Alcohol use: Yes    Comment: RARELY  . Drug use: No  . Sexual activity: Not on file  Other Topics Concern  . Not on file  Social History Narrative  . Not on file   Social Determinants of Health   Financial Resource Strain:   . Difficulty of Paying Living Expenses: Not on file  Food Insecurity:   . Worried About Charity fundraiser in the Last Year: Not on file  . Ran Out of Food in the Last Year: Not on file  Transportation Needs:   . Lack of Transportation (Medical): Not on file  . Lack of Transportation (Non-Medical): Not on file  Physical Activity:   . Days of Exercise per Week: Not on file  . Minutes of Exercise per Session: Not on file  Stress:   . Feeling of Stress : Not on file  Social Connections:   . Frequency of Communication with Friends and Family: Not on file  . Frequency of Social Gatherings with Friends and Family: Not on file  .  Attends Religious Services: Not on file  . Active Member of Clubs or Organizations: Not on file  . Attends Archivist Meetings: Not on file  . Marital Status: Not on file    Outpatient Medications Prior to Visit  Medication Sig Dispense Refill  . Accu-Chek FastClix Lancets MISC 1 EACH BY DOES NOT APPLY ROUTE DAILY. USE FOR ACCU-CHEK GUIDE 102 each 3  . ACCU-CHEK GUIDE test strip 1 EACH BY OTHER ROUTE DAILY. 100 strip 3  . acetaminophen (TYLENOL) 650 MG CR tablet Take 650 mg by mouth every 8 (eight) hours as needed.      Marland Kitchen aspirin 81 MG tablet Take 81 mg by mouth daily.      . blood glucose meter kit and supplies KIT Dispense based on patient and insurance preference. Use up to twice daily as directed. 1 each 0  . Blood Glucose Monitoring Suppl (ACCU-CHEK GUIDE ME) w/Device KIT 1 each by Other route daily. 1 kit 0  . CALCIUM PO Take 1,000 mg by mouth daily.    . chlorthalidone (HYGROTON) 25 MG tablet TAKE 1 TABLET BY MOUTH EVERY DAY 90 tablet 1  . EPIPEN 2-PAK 0.3 MG/0.3ML SOAJ injection 0.3 mg. Reported  on 05/01/2015  1  . fluticasone (FLONASE) 50 MCG/ACT nasal spray Place 1 spray into both nostrils daily. 16 g 5  . Melatonin 10 MG TABS Take 1 tablet by mouth at bedtime.    . metFORMIN (GLUCOPHAGE-XR) 500 MG 24 hr tablet Take 500 mg by mouth in the morning and at bedtime.     . Multiple Vitamin (MULTIVITAMIN) tablet Take 1 tablet by mouth daily.      . NON FORMULARY EVERY OTHER WEEK ALLERGY INJECTIONS    . potassium chloride SA (KLOR-CON M20) 20 MEQ tablet Take 1 tablet (20 mEq total) by mouth 2 (two) times daily. (Patient taking differently: Take 20 mEq by mouth daily. ) 180 tablet 6  . PROAIR HFA 108 (90 BASE) MCG/ACT inhaler Inhale 1 puff into the lungs every 6 (six) hours as needed.   0  . traMADol (ULTRAM) 50 MG tablet Take 1 tablet (50 mg total) by mouth 2 (two) times daily as needed for severe pain. 60 tablet 2  . metFORMIN (GLUCOPHAGE) 500 MG tablet Take 1 tablet (500 mg total) by mouth 2 (two) times daily with a meal. (Patient not taking: Reported on 11/12/2019) 180 tablet 1  . pantoprazole (PROTONIX) 40 MG tablet TAKE 1 TABLET (40 MG TOTAL) BY MOUTH DAILY. (Patient not taking: Reported on 11/12/2019) 90 tablet 2  . pravastatin (PRAVACHOL) 40 MG tablet Take 1 tablet (40 mg total) by mouth daily. (Patient not taking: Reported on 11/12/2019) 90 tablet 3   No facility-administered medications prior to visit.     EXAM:  BP (!) 148/82   Pulse (!) 103   Temp 98.7 F (37.1 C) (Oral)   Ht 5' 4"  (1.626 m)   Wt 248 lb 12.8 oz (112.9 kg)   SpO2 98%   BMI 42.71 kg/m   Body mass index is 42.71 kg/m.  GENERAL: vitals reviewed and listed above, alert, oriented, appears well hydrated and in no acute distress walks with cane  HEENT: atraumatic, conjunctiva  clear, no obvious abnormalities on inspection of external nose and ears OP : masked  NECK: no obvious masses on inspection palpation  LUNGS: clear to auscultation bilaterally, no wheezes, rales or rhonchi, good air movement CV: HRRR, no  clubbing cyanosis or  1 + ede,a mo redness  nl cap refill  MS: moves all extremities without noticeable focal  Abnormality righgt leg slighkty cooler than  Left leg but  Right dp 1+  Left 2+?  No sores ulcers and no acute  joint swelling  Walk  Gait antalgic but independent   Doesn't feel safe getting up on table  So exam in chair and walk  PSYCH: pleasant and cooperative, no obvious depression or anxiety Lab Results  Component Value Date   WBC 7.2 09/18/2019   HGB 12.8 09/18/2019   HCT 38.2 09/18/2019   PLT 196 09/18/2019   GLUCOSE 138 (H) 09/18/2019   CHOL 196 09/18/2019   TRIG 129 09/18/2019   HDL 50 09/18/2019   LDLCALC 122 (H) 09/18/2019   ALT 13 09/18/2019   AST 15 09/18/2019   NA 140 09/18/2019   K 3.5 09/18/2019   CL 100 09/18/2019   CREATININE 0.61 09/18/2019   BUN 14 09/18/2019   CO2 28 09/18/2019   TSH 1.68 09/18/2019   INR 1.3 (H) 07/23/2014   HGBA1C 7.1 (H) 09/18/2019   MICROALBUR 0.2 09/18/2019   BP Readings from Last 3 Encounters:  11/12/19 (!) 148/82  09/18/19 120/70  01/11/19 130/80    ASSESSMENT AND PLAN:  Discussed the following assessment and plan:  Right thigh pain - Plan: VAS [place] LOWER EXTREMITY VENOUS (DVT), VAS [place] LE ART SEG MULTI (Segm&LE Reynauds), DG Hip Unilat W OR W/O Pelvis 2-3 Views Left, DG FEMUR, MIN 2 VIEWS RIGHT, CANCELED: VAS [place] ABI WITH/WO TBI  Tingling of both feet - Plan: VAS [place] LOWER EXTREMITY VENOUS (DVT), VAS [place] LE ART SEG MULTI (Segm&LE Reynauds), DG Hip Unilat W OR W/O Pelvis 2-3 Views Left, DG FEMUR, MIN 2 VIEWS RIGHT, CANCELED: VAS [place] ABI WITH/WO TBI  Diabetes mellitus with coincident hypertension (HCC) - Plan: VAS [place] LOWER EXTREMITY VENOUS (DVT), VAS [place] LE ART SEG MULTI (Segm&LE Reynauds), CANCELED: VAS [place] ABI WITH/WO TBI  Back pain, unspecified back location, unspecified back pain laterality, unspecified chronicity - Plan: POCT urinalysis dipstick  Groin pain, right - Plan: DG Hip Unilat W OR W/O Pelvis 2-3 Views Left, DG  FEMUR, MIN 2 VIEWS RIGHT Uncertain cause  Som sx cw neuropathy but right thigh pain  May be from hip or other  Worse at night   Is of concern   Pulse present right foot but may me less than left   No rash  cw zoster  And length of  Problem seems too long for  Shingles .    But color and cap refill ok  Feels cooler  obtain  Venous and abi  Then fu pcp  Consider hip evaluation   Also  Orders placed  Fu PCP  -Patient advised to return or notify health care team  if  new concerns arise.  Patient Instructions  Checking  Circulation in right leg   Could be a hip problem and diabetes  Tingling    We can try adding  Gabapentin for pain  In interim    If getting more serious pain then  Seek emergency help .  Standley Brooking. Foxx Klarich M.D.

## 2019-11-12 NOTE — Progress Notes (Signed)
Tell patient no evidence of blood clots in the legs    from the testing today.  I will send in gabapentin to try for pain  in the interim  Plan fu with PCP as planned   to ed if  urgent becoming worse

## 2019-11-12 NOTE — Patient Instructions (Signed)
Checking  Circulation in right leg   Could be a hip problem and diabetes  Tingling    We can try adding  Gabapentin for pain  In interim    If getting more serious pain then  Seek emergency help .

## 2019-11-12 NOTE — Telephone Encounter (Signed)
She said to send it to Dr. Regis Bill

## 2019-11-12 NOTE — Progress Notes (Signed)
Also   I  ordered x ray to be done at [facility]  hip and femur   have her get this done also

## 2019-11-13 ENCOUNTER — Ambulatory Visit (INDEPENDENT_AMBULATORY_CARE_PROVIDER_SITE_OTHER)
Admission: RE | Admit: 2019-11-13 | Discharge: 2019-11-13 | Disposition: A | Discharge status: Home / Self Care | Payer: Medicare PPO | Source: Ambulatory Visit | Attending: Internal Medicine | Admitting: Internal Medicine

## 2019-11-13 DIAGNOSIS — R202 Paresthesia of skin: Secondary | ICD-10-CM

## 2019-11-13 DIAGNOSIS — R1031 Right lower quadrant pain: Secondary | ICD-10-CM

## 2019-11-13 DIAGNOSIS — M79651 Pain in right thigh: Secondary | ICD-10-CM

## 2019-11-13 DIAGNOSIS — R1030 Lower abdominal pain, unspecified: Secondary | ICD-10-CM | POA: Diagnosis not present

## 2019-11-13 NOTE — Progress Notes (Signed)
No major findings on   x ray of hip and leg   mild arthritis    doesn't explain  the pain you are having .

## 2019-11-13 NOTE — Progress Notes (Signed)
Final reading confirm no clot

## 2019-11-14 ENCOUNTER — Telehealth: Payer: Self-pay | Admitting: Internal Medicine

## 2019-11-14 NOTE — Telephone Encounter (Signed)
Deferring   To her pcp  Since she is on tramadol  Can take that in short run every 8 hours if needed

## 2019-11-14 NOTE — Telephone Encounter (Signed)
Called patient and gave her the message from Dr. Panosh. Patient verbalized an understanding. 

## 2019-11-14 NOTE — Telephone Encounter (Signed)
Pt called to say the prescription   gabapentin (NEURONTIN) 100 MG capsule   She read the side effects and decided not to take the medication. She is wanting something else  Please call pt [phone]

## 2019-11-14 NOTE — Telephone Encounter (Signed)
Please see message.  Please advise. 

## 2019-11-20 DIAGNOSIS — J301 Allergic rhinitis due to pollen: Secondary | ICD-10-CM | POA: Diagnosis not present

## 2019-11-20 DIAGNOSIS — J3089 Other allergic rhinitis: Secondary | ICD-10-CM | POA: Diagnosis not present

## 2019-11-26 ENCOUNTER — Other Ambulatory Visit: Payer: Self-pay | Admitting: *Deleted

## 2019-11-26 NOTE — Telephone Encounter (Signed)
potassium chloride SA (KLOR-CON M20) 20 MEQ tablet  Last filled 10/18/17 by Dr Raliegh Ip.  Faythe Ghee to fill?

## 2019-11-28 ENCOUNTER — Encounter: Payer: Self-pay | Admitting: Internal Medicine

## 2019-11-28 ENCOUNTER — Ambulatory Visit (INDEPENDENT_AMBULATORY_CARE_PROVIDER_SITE_OTHER): Payer: Medicare PPO | Admitting: Internal Medicine

## 2019-11-28 ENCOUNTER — Other Ambulatory Visit: Payer: Self-pay

## 2019-11-28 VITALS — BP 130/80 | HR 96 | Temp 98.3°F | Ht 63.5 in | Wt 246.8 lb

## 2019-11-28 DIAGNOSIS — J3089 Other allergic rhinitis: Secondary | ICD-10-CM | POA: Diagnosis not present

## 2019-11-28 DIAGNOSIS — E785 Hyperlipidemia, unspecified: Secondary | ICD-10-CM | POA: Diagnosis not present

## 2019-11-28 DIAGNOSIS — E876 Hypokalemia: Secondary | ICD-10-CM

## 2019-11-28 DIAGNOSIS — I10 Essential (primary) hypertension: Secondary | ICD-10-CM

## 2019-11-28 DIAGNOSIS — Z789 Other specified health status: Secondary | ICD-10-CM

## 2019-11-28 DIAGNOSIS — Z800 Family history of malignant neoplasm of digestive organs: Secondary | ICD-10-CM | POA: Diagnosis not present

## 2019-11-28 DIAGNOSIS — M79604 Pain in right leg: Secondary | ICD-10-CM

## 2019-11-28 DIAGNOSIS — Z23 Encounter for immunization: Secondary | ICD-10-CM | POA: Diagnosis not present

## 2019-11-28 DIAGNOSIS — E119 Type 2 diabetes mellitus without complications: Secondary | ICD-10-CM

## 2019-11-28 DIAGNOSIS — Z0000 Encounter for general adult medical examination without abnormal findings: Secondary | ICD-10-CM | POA: Diagnosis not present

## 2019-11-28 NOTE — Progress Notes (Signed)
Established Patient Office Visit     This visit occurred during the SARS-CoV-2 public health emergency.  Safety protocols were in place, including screening questions prior to the visit, additional usage of staff PPE, and extensive cleaning of exam room while observing appropriate contact time as indicated for disinfecting solutions.    CC/Reason for Visit: Annual preventive exam and subsequent Medicare wellness visit  HPI: Darlene Mclaughlin is a 69 y.o. female who is coming in today for the above mentioned reasons. Past Medical History is significant for: Hypertension that has been well controlled on chlorthalidone, type 2 diabetes well controlled on Metformin, hyperlipidemia with a statin intolerance, GERD not on PPI therapy, chronic back pain maintained on tramadol.  She has been having some right leg pain for a few weeks now.  She saw another provider in the office, had an ultrasound that was negative for DVT, was prescribed gabapentin but she refused to take, she has no lower back pain.  Pain starts in the anterior hip and goes all the way down into the toes, she describes a burning sensation in the toes.  It does not really limit her ambulation.  Other than that she has been feeling well, she has routine eye and dental care, she is due for flu vaccine, COVID booster and will be due for a second Pneumovax in December 2021.  She had a colonoscopy in 2018 and is a 5-year callback, she is scheduled for her mammogram later this month, she has not had a Pap smear in years but would like to defer today   Past Medical/Surgical History: Past Medical History:  Diagnosis Date  . ALLERGIC RHINITIS 05/15/2008  . Anemia   . ASTHMA 05/15/2008  . Blood transfusion without reported diagnosis 1991   anemia  . Breast cancer ([place]) 2016   right breast  . 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 Diabetes mellitus without complication ([place])   . GERD (gastroesophageal reflux disease)   . Headache(784.0)  05/15/2008  . HYPERTENSION 05/15/2008  . IMPAIRED GLUCOSE TOLERANCE 05/15/2008  . Obesity   . OSTEOARTHRITIS 05/15/2008   back    Past Surgical History:  Procedure Laterality Date  . ABDOMINAL HYSTERECTOMY    . BREAST LUMPECTOMY Right 12/17/2014  . BREAST LUMPECTOMY WITH RADIOACTIVE SEED LOCALIZATION Right 12/17/2014   Procedure: RIGHT BREAST RADIOACTIVE SEED GUIDED PARTIAL MASTECTOMY;  Surgeon: Erroll Luna, MD;  Location: [facility];  Service: General;  Laterality: Right;  . CESAREAN SECTION    . KNEE SURGERY     arthroscopic left    Social History:  reports that she has never smoked. She has never used smokeless tobacco. She reports current alcohol use. She reports that she does not use drugs.  Allergies: Allergies  Allergen Reactions  . Atorvastatin   . Hydrochlorothiazide   . Macrobid [Nitrofurantoin Monohyd Macro]      Liver problems  . Naproxen   . Simvastatin     Hallucinations, muscle pains  . Zanaflex [Tizanidine Hcl] Other (See Comments)     Liver problems    Family History:  Family History  Problem Relation Age of Onset  . Diabetes Mother   . Hypertension Mother   . Breast cancer Mother 55       Breast cancer twice   . Hyperlipidemia Father   . Colon cancer Brother        4 th brother  . Diabetes Brother   . Pancreatic cancer Brother  middle brother  . Stomach cancer Neg Hx      Current Outpatient Medications:  .  Accu-Chek FastClix Lancets MISC, 1 EACH BY DOES NOT APPLY ROUTE DAILY. USE FOR ACCU-CHEK GUIDE, Disp: 102 each, Rfl: 3 .  ACCU-CHEK GUIDE test strip, 1 EACH BY OTHER ROUTE DAILY., Disp: 100 strip, Rfl: 3 .  acetaminophen (TYLENOL) 650 MG CR tablet, Take 650 mg by mouth every 8 (eight) hours as needed.  , Disp: , Rfl:  .  aspirin 81 MG tablet, Take 81 mg by mouth daily.  , Disp: , Rfl:  .  blood glucose meter kit and supplies KIT, Dispense based on patient and insurance preference. Use up to twice daily as directed.,  Disp: 1 each, Rfl: 0 .  Blood Glucose Monitoring Suppl (ACCU-CHEK GUIDE ME) w/Device KIT, 1 each by Other route daily., Disp: 1 kit, Rfl: 0 .  CALCIUM PO, Take 1,000 mg by mouth daily., Disp: , Rfl:  .  chlorthalidone (HYGROTON) 25 MG tablet, TAKE 1 TABLET BY MOUTH EVERY DAY, Disp: 90 tablet, Rfl: 1 .  EPIPEN 2-PAK 0.3 MG/0.3ML SOAJ injection, 0.3 mg. Reported on 05/01/2015, Disp: , Rfl: 1 .  fluticasone (FLONASE) 50 MCG/ACT nasal spray, Place 1 spray into both nostrils daily., Disp: 16 g, Rfl: 5 .  gabapentin (NEURONTIN) 100 MG capsule, Take 1-3 capsules (100-300 mg total) by mouth at bedtime. For nerve pain, Disp: 60 capsule, Rfl: 0 .  Melatonin 10 MG TABS, Take 1 tablet by mouth at bedtime., Disp: , Rfl:  .  metFORMIN (GLUCOPHAGE-XR) 500 MG 24 hr tablet, Take 500 mg by mouth in the morning and at bedtime. , Disp: , Rfl:  .  Multiple Vitamin (MULTIVITAMIN) tablet, Take 1 tablet by mouth daily.  , Disp: , Rfl:  .  NON FORMULARY, EVERY OTHER WEEK ALLERGY INJECTIONS, Disp: , Rfl:  .  potassium chloride SA (KLOR-CON M20) 20 MEQ tablet, Take 1 tablet (20 mEq total) by mouth 2 (two) times daily. (Patient taking differently: Take 20 mEq by mouth daily. ), Disp: 180 tablet, Rfl: 6 .  pravastatin (PRAVACHOL) 40 MG tablet, Take 1 tablet (40 mg total) by mouth daily., Disp: 90 tablet, Rfl: 3 .  PROAIR HFA 108 (90 BASE) MCG/ACT inhaler, Inhale 1 puff into the lungs every 6 (six) hours as needed. , Disp: , Rfl: 0 .  traMADol (ULTRAM) 50 MG tablet, Take 1 tablet (50 mg total) by mouth 2 (two) times daily as needed for severe pain. (Patient taking differently: Take 50 mg by mouth every 8 (eight) hours as needed for severe pain. ), Disp: 60 tablet, Rfl: 2  Review of Systems:  Constitutional: Denies fever, chills, diaphoresis, appetite change and fatigue.  HEENT: Denies photophobia, eye pain, redness, hearing loss, ear pain, congestion, sore throat, rhinorrhea, sneezing, mouth sores, trouble swallowing, neck pain,  neck stiffness and tinnitus.   Respiratory: Denies SOB, DOE, cough, chest tightness,  and wheezing.   Cardiovascular: Denies chest pain, palpitations and leg swelling.  Gastrointestinal: Denies nausea, vomiting, abdominal pain, diarrhea, constipation, blood in stool and abdominal distention.  Genitourinary: Denies dysuria, urgency, frequency, hematuria, flank pain and difficulty urinating.  Endocrine: Denies: hot or cold intolerance, sweats, changes in hair or nails, polyuria, polydipsia. Musculoskeletal: Denies  back pain, joint swelling, arthralgias and gait problem.  Skin: Denies pallor, rash and wound.  Neurological: Denies dizziness, seizures, syncope, weakness, light-headedness, numbness and headaches.  Hematological: Denies adenopathy. Easy bruising, personal or family bleeding history  Psychiatric/Behavioral: Denies suicidal ideation,  mood changes, confusion, nervousness, sleep disturbance and agitation    Physical Exam: Vitals:   11/28/19 0701  BP: 130/80  Pulse: 96  Temp: 98.3 F (36.8 C)  TempSrc: Oral  SpO2: 98%  Weight: 246 lb 12.8 oz (111.9 kg)  Height: 5' 3.5" (1.613 m)    Body mass index is 43.03 kg/m.   Constitutional: NAD, calm, comfortable, obese Eyes: PERRL, lids and conjunctivae normal, wears corrective lenses ENMT: Mucous membranes are moist. Posterior pharynx clear of any exudate or lesions. Normal dentition. Tympanic membrane is pearly white, no erythema or bulging. Neck: normal, supple, no masses, no thyromegaly Respiratory: clear to auscultation bilaterally, no wheezing, no crackles. Normal respiratory effort. No accessory muscle use.  Cardiovascular: Regular rate and rhythm, no murmurs / rubs / gallops. No extremity edema. 2+ pedal pulses. No carotid bruits.  Abdomen: no tenderness, no masses palpated. No hepatosplenomegaly. Bowel sounds positive.  Musculoskeletal: no clubbing / cyanosis. No joint deformity upper and lower extremities. Good ROM, no  contractures. Normal muscle tone.  Skin: no rashes, lesions, ulcers. No induration Neurologic: CN 2-12 grossly intact. Sensation intact, DTR normal. Strength 5/5 in all 4.  Psychiatric: Normal judgment and insight. Alert and oriented x 3. Normal mood.    Subsequent Medicare wellness visit   1. Risk factors, based on past  M,S,F -cardiovascular disease risk factors include age, obesity, history of untreated hyperlipidemia, history of hypertension, history of type 2 diabetes   2.  Physical activities: Very sedentary   3.  Depression/mood:  Stable not depressed   4.  Hearing:  No perceived issues   5.  ADL's: Independent in all ADLs   6.  Fall risk:  Low fall risk   7.  Home safety: No problems identified   8.  Height weight, and visual acuity: height and weight as above, vision:   Visual Acuity Screening   Right eye Left eye Both eyes  Without correction:     With correction: 20/25 20/25 20/20      9.  Counseling:  She will have Pap smear when she returns in 3 months, Pneumovax will be due in December   10. Lab orders based on risk factors: Laboratory update will be reviewed   11. Referral :  None today   12. Care plan:  Follow-up with me in 3 months   13. Cognitive assessment:  No cognitive impairment   14. Screening: Patient provided with a written and personalized 5-10 year screening schedule in the AVS.   yes   15. Provider List Update:   PCP only  16. Advance Directives: Full code     Office Visit from 11/28/2019 in [facility]  PHQ-9 Total Score 0      Fall Risk  11/28/2019 01/11/2019 08/22/2017 12/31/2014 12/04/2014  Falls in the past year? 0 1 Yes Yes Yes  Number falls in past yr: 0 0 1 1 2  or more  Injury with Fall? 0 0 - No No  Risk Factor Category  - - - - High Fall Risk  Comment - - - - gait  Risk for fall due to : - - - - -  Follow up - - Education provided - -     Impression and Plan:  Encounter for preventive health  examination -She has routine eye and dental care. -Due for flu vaccine, COVID booster, she will receive flu vaccine in office today, will schedule Covid booster at pharmacy.  She will be due for Pneumovax in December -  Screening labs today. -Healthy lifestyle discussed in detail. -Colonoscopy 2018 and is a 5-year callback. -She is due for mammogram this month. -She is overdue for her Pap smear, she elects to defer today and do it next visit.  Family history of colon cancer -She last had a colonoscopy in 2018 and is a 5-year callback.  Diabetes mellitus with coincident hypertension ([place]) -Last A1c was 7.1 in July 2021, recheck A1c today, she is only on Metformin 500 mg daily.  Essential hypertension -Well-controlled.  Dyslipidemia Statin intolerance -Last LDL was 122 in July 2021, she has severe statin intolerance, cannot even tolerate pravastatin, goal LDL is less than 70 given her history of diabetes. -I will refer her to the lipid clinic for further recommendations.  Morbid obesity ([place]) -Discussed healthy lifestyle, including increased physical activity and better food choices to promote weight loss.  Hypokalemia  - Plan: Basic metabolic panel -She is requesting potassium refills, check K levels today.  Right leg pain  - Plan: Ambulatory referral to Physical Therapy  Need for influenza vaccination -Flu vaccine administered today.    Patient Instructions  -Nice seeing you today!!  -Lab work today; will notify you once results are available.  -Flu vaccine today.  -Remember to get your COVID booster at the pharmacy.  -PT will be arranged.   Preventive Care 80 Years and Older, Female Preventive care refers to lifestyle choices and visits with your health care provider that can promote health and wellness. This includes:  A yearly physical exam. This is also called an annual well check.  Regular dental and eye exams.  Immunizations.  Screening for certain  conditions.  Healthy lifestyle choices, such as diet and exercise. What can I expect for my preventive care visit? Physical exam Your health care provider will check:  Height and weight. These may be used to calculate body mass index (BMI), which is a measurement that tells if you are at a healthy weight.  Heart rate and blood pressure.  Your skin for abnormal spots. Counseling Your health care provider may ask you questions about:  Alcohol, tobacco, and drug use.  Emotional well-being.  Home and relationship well-being.  Sexual activity.  Eating habits.  History of falls.  Memory and ability to understand (cognition).  Work and work Statistician.  Pregnancy and menstrual history. What immunizations do I need?  Influenza (flu) vaccine  This is recommended every year. Tetanus, diphtheria, and pertussis (Tdap) vaccine  You may need a Td booster every 10 years. Varicella (chickenpox) vaccine  You may need this vaccine if you have not already been vaccinated. Zoster (shingles) vaccine  You may need this after age 79. Pneumococcal conjugate (PCV13) vaccine  One dose is recommended after age 38. Pneumococcal polysaccharide (PPSV23) vaccine  One dose is recommended after age 7. Measles, mumps, and rubella (MMR) vaccine  You may need at least one dose of MMR if you were born in 1957 or later. You may also need a second dose. Meningococcal conjugate (MenACWY) vaccine  You may need this if you have certain conditions. Hepatitis A vaccine  You may need this if you have certain conditions or if you travel or work in places where you may be exposed to hepatitis A. Hepatitis B vaccine  You may need this if you have certain conditions or if you travel or work in places where you may be exposed to hepatitis B. Haemophilus influenzae type b (Hib) vaccine  You may need this if you have certain conditions. You  may receive vaccines as individual doses or as more than  one vaccine together in one shot (combination vaccines). Talk with your health care provider about the risks and benefits of combination vaccines. What tests do I need? Blood tests  Lipid and cholesterol levels. These may be checked every 5 years, or more frequently depending on your overall health.  Hepatitis C test.  Hepatitis B test. Screening  Lung cancer screening. You may have this screening every year starting at age 29 if you have a 30-pack-year history of smoking and currently smoke or have quit within the past 15 years.  Colorectal cancer screening. All adults should have this screening starting at age 21 and continuing until age 77. Your health care provider may recommend screening at age 60 if you are at increased risk. You will have tests every 1-10 years, depending on your results and the type of screening test.  Diabetes screening. This is done by checking your blood sugar (glucose) after you have not eaten for a while (fasting). You may have this done every 1-3 years.  Mammogram. This may be done every 1-2 years. Talk with your health care provider about how often you should have regular mammograms.  BRCA-related cancer screening. This may be done if you have a family history of breast, ovarian, tubal, or peritoneal cancers. Other tests  Sexually transmitted disease (STD) testing.  Bone density scan. This is done to screen for osteoporosis. You may have this done starting at age 75. Follow these instructions at home: Eating and drinking  Eat a diet that includes fresh fruits and vegetables, whole grains, lean protein, and low-fat dairy products. Limit your intake of foods with high amounts of sugar, saturated fats, and salt.  Take vitamin and mineral supplements as recommended by your health care provider.  Do not drink alcohol if your health care provider tells you not to drink.  If you drink alcohol: ? Limit how much you have to 0-1 drink a day. ? Be aware of how  much alcohol is in your drink. In the [place] one drink equals one 12 oz bottle of beer (355 mL), one 5 oz glass of wine (148 mL), or one 1 oz glass of hard liquor (44 mL). Lifestyle  Take daily care of your teeth and gums.  Stay active. Exercise for at least 30 minutes on 5 or more days each week.  Do not use any products that contain nicotine or tobacco, such as cigarettes, e-cigarettes, and chewing tobacco. If you need help quitting, ask your health care provider.  If you are sexually active, practice safe sex. Use a condom or other form of protection in order to prevent STIs (sexually transmitted infections).  Talk with your health care provider about taking a low-dose aspirin or statin. What's next?  Go to your health care provider once a year for a well check visit.  Ask your health care provider how often you should have your eyes and teeth checked.  Stay up to date on all vaccines. This information is not intended to replace advice given to you by your health care provider. Make sure you discuss any questions you have with your health care provider. Document Revised: 02/02/2018 Document Reviewed: 02/02/2018 Elsevier Patient Education  2020 [place], [place] [facility]

## 2019-11-28 NOTE — Patient Instructions (Signed)
-Nice seeing you today!!  -Lab work today; will notify you once results are available.  -Flu vaccine today.  -Remember to get your COVID booster at the pharmacy.  -PT will be arranged.   Preventive Care 71 Years and Older, Female Preventive care refers to lifestyle choices and visits with your health care provider that can promote health and wellness. This includes:  A yearly physical exam. This is also called an annual well check.  Regular dental and eye exams.  Immunizations.  Screening for certain conditions.  Healthy lifestyle choices, such as diet and exercise. What can I expect for my preventive care visit? Physical exam Your health care provider will check:  Height and weight. These may be used to calculate body mass index (BMI), which is a measurement that tells if you are at a healthy weight.  Heart rate and blood pressure.  Your skin for abnormal spots. Counseling Your health care provider may ask you questions about:  Alcohol, tobacco, and drug use.  Emotional well-being.  Home and relationship well-being.  Sexual activity.  Eating habits.  History of falls.  Memory and ability to understand (cognition).  Work and work Statistician.  Pregnancy and menstrual history. What immunizations do I need?  Influenza (flu) vaccine  This is recommended every year. Tetanus, diphtheria, and pertussis (Tdap) vaccine  You may need a Td booster every 10 years. Varicella (chickenpox) vaccine  You may need this vaccine if you have not already been vaccinated. Zoster (shingles) vaccine  You may need this after age 38. Pneumococcal conjugate (PCV13) vaccine  One dose is recommended after age 57. Pneumococcal polysaccharide (PPSV23) vaccine  One dose is recommended after age 44. Measles, mumps, and rubella (MMR) vaccine  You may need at least one dose of MMR if you were born in 1957 or later. You may also need a second dose. Meningococcal conjugate  (MenACWY) vaccine  You may need this if you have certain conditions. Hepatitis A vaccine  You may need this if you have certain conditions or if you travel or work in places where you may be exposed to hepatitis A. Hepatitis B vaccine  You may need this if you have certain conditions or if you travel or work in places where you may be exposed to hepatitis B. Haemophilus influenzae type b (Hib) vaccine  You may need this if you have certain conditions. You may receive vaccines as individual doses or as more than one vaccine together in one shot (combination vaccines). Talk with your health care provider about the risks and benefits of combination vaccines. What tests do I need? Blood tests  Lipid and cholesterol levels. These may be checked every 5 years, or more frequently depending on your overall health.  Hepatitis C test.  Hepatitis B test. Screening  Lung cancer screening. You may have this screening every year starting at age 84 if you have a 30-pack-year history of smoking and currently smoke or have quit within the past 15 years.  Colorectal cancer screening. All adults should have this screening starting at age 7 and continuing until age 68. Your health care provider may recommend screening at age 54 if you are at increased risk. You will have tests every 1-10 years, depending on your results and the type of screening test.  Diabetes screening. This is done by checking your blood sugar (glucose) after you have not eaten for a while (fasting). You may have this done every 1-3 years.  Mammogram. This may be done every 1-2  years. Talk with your health care provider about how often you should have regular mammograms.  BRCA-related cancer screening. This may be done if you have a family history of breast, ovarian, tubal, or peritoneal cancers. Other tests  Sexually transmitted disease (STD) testing.  Bone density scan. This is done to screen for osteoporosis. You may have this  done starting at age 92. Follow these instructions at home: Eating and drinking  Eat a diet that includes fresh fruits and vegetables, whole grains, lean protein, and low-fat dairy products. Limit your intake of foods with high amounts of sugar, saturated fats, and salt.  Take vitamin and mineral supplements as recommended by your health care provider.  Do not drink alcohol if your health care provider tells you not to drink.  If you drink alcohol: ? Limit how much you have to 0-1 drink a day. ? Be aware of how much alcohol is in your drink. In the [place] one drink equals one 12 oz bottle of beer (355 mL), one 5 oz glass of wine (148 mL), or one 1 oz glass of hard liquor (44 mL). Lifestyle  Take daily care of your teeth and gums.  Stay active. Exercise for at least 30 minutes on 5 or more days each week.  Do not use any products that contain nicotine or tobacco, such as cigarettes, e-cigarettes, and chewing tobacco. If you need help quitting, ask your health care provider.  If you are sexually active, practice safe sex. Use a condom or other form of protection in order to prevent STIs (sexually transmitted infections).  Talk with your health care provider about taking a low-dose aspirin or statin. What's next?  Go to your health care provider once a year for a well check visit.  Ask your health care provider how often you should have your eyes and teeth checked.  Stay up to date on all vaccines. This information is not intended to replace advice given to you by your health care provider. Make sure you discuss any questions you have with your health care provider. Document Revised: 02/02/2018 Document Reviewed: 02/02/2018 Elsevier Patient Education  2020 [employer].

## 2019-11-28 NOTE — Addendum Note (Signed)
Addended by: Westley Hummer B on: 11/28/2019 05:41 PM   Modules accepted: Orders

## 2019-11-29 ENCOUNTER — Encounter: Payer: Medicare PPO | Admitting: Internal Medicine

## 2019-11-29 LAB — BASIC METABOLIC PANEL
BUN: 14 mg/dL (ref 7–25)
CO2: 31 mmol/L (ref 20–32)
Calcium: 10.1 mg/dL (ref 8.6–10.4)
Chloride: 101 mmol/L (ref 98–110)
Creat: 0.67 mg/dL (ref 0.60–0.93)
Glucose, Bld: 148 mg/dL — ABNORMAL HIGH (ref 65–99)
Potassium: 3.5 mmol/L (ref 3.5–5.3)
Sodium: 140 mmol/L (ref 135–146)

## 2019-12-03 NOTE — Progress Notes (Signed)
[place]   [phone] [phone]   Clinic Follow up Note   Patient Care Team: Isaac Bliss, Rayford Halsted, MD as PCP - General (Internal Medicine) Erroll Luna, MD as Consulting Physician (General Surgery) Arloa Koh, MD (Inactive) as Consulting Physician (Radiation Oncology) Truitt Merle, MD as Consulting Physician (Hematology) Sylvan Cheese, NP as Nurse Practitioner (Hematology and Oncology) 12/04/2019  CHIEF COMPLAINT: Follow-up right breast DCIS  SUMMARY OF ONCOLOGIC HISTORY: Oncology History Overview Note  Breast cancer of lower-outer quadrant of right female breast [facility]   Staging form: Breast, AJCC 7th Edition     Clinical stage from 11/15/2014: Stage 0 (Tis (DCIS), N0, M0) - Signed by Truitt Merle, MD on 11/26/2014     Ductal carcinoma in situ (DCIS) of right breast  11/06/2014 Mammogram   Screening mammogram showed calcifications in the lower outer quadrant of the right breast, which was confirmed by diagnostic mammogram   11/15/2014 Initial Biopsy   Breast core needle biopsy showed ductal carcinoma in situ with calcifications.   11/15/2014 Receptors her2   ER+ (100%); PR+ (90%)   11/15/2014 Clinical Stage   Stage 0: Tis N0   12/17/2014 Surgery   Right breast lumpectomy.   12/17/2014 Pathology Results   Right breast lumpectomy showed usual ductal Hyperplasia and fibroadenoma. No residual ductal carcinoma in situ.     Radiation Therapy   Not recommended due to lack of residual malignancy at time of definitive surgery   03/28/2015 - 08/2017 Anti-estrogen oral therapy   Anastrozole 1 mg daily begun 03/28/2015 and stopped 08/2017 due to poor tolerance.    05/01/2015 Survivorship   Survivorship visit completed and copy of care plan provided to patient   11/06/2015 Imaging   MM DIAG BREAST TOMO BILATERAL 11/06/15 IMPRESSION: No mammographic evidence of malignancy. BI-RADS CATEGORY  2: Benign.   08/04/2016 Pathology Results    Diagnosis  Surgical [P], cecum, polyp - TUBULAR ADENOMA. - NO HIGH GRADE DYSPLASIA OR MALIGNANCY.   08/04/2016 Procedure   Colonoscopy A 2 mm polyp was found in the cecum. The polyp was sessile. The polyp was removed with a cold snare. Resection and retrieval were complete. Findings: - The exam was otherwise without abnormality on direct and retroflexion views.   11/11/2016 Mammogram   IMPRESSION: 1. No mammographic evidence of malignancy. 2. Stable right breast postsurgical changes.     CURRENT THERAPY: Surveillance  INTERVAL HISTORY: Darlene Mclaughlin returns for annual follow-up as scheduled.  She was last seen by Dr. Burr Medico on 12/04/2018.  Her annual mammogram is scheduled next week.  She had a good year until September when she developed tingling and burning in her feet then right upper leg ache which progressed over 2 weeks now is quite severe, worse at night and with sitting.  Ambulates with a cane.  She is able to manage independently at home but slowly and carefully.  No fall.  Sleeping is difficult because of the pain.  She had similar pain in the left leg when she reacted to a statin, not currently on a statin or other new meds.  She does have existing lumbar stenosis and chronic back pain.  This is being managed by PCP Dr. Jerilee Hoh.  She has vascular imaging scheduled on 10/13.  From a breast cancer/DCIS standpoint, she denies new mass in her breast, nipple discharge or inversion.  Denies change in her bowel habits, abdominal pain, any recent fever, chills, cough, chest pain, dyspnea.  She received the flu vaccine recently.  Last Covid vaccine in March.   MEDICAL HISTORY:  Past Medical History:  Diagnosis Date  . ALLERGIC RHINITIS 05/15/2008  . Anemia   . ASTHMA 05/15/2008  . Blood transfusion without reported diagnosis 1991   anemia  . Breast cancer ([place]) 2016   right breast  . Cancer ([place])    DCIS right breast  . Diabetes mellitus without complication ([place])   . GERD  (gastroesophageal reflux disease)   . Headache(784.0) 05/15/2008  . HYPERTENSION 05/15/2008  . IMPAIRED GLUCOSE TOLERANCE 05/15/2008  . Obesity   . OSTEOARTHRITIS 05/15/2008   back    SURGICAL HISTORY: Past Surgical History:  Procedure Laterality Date  . ABDOMINAL HYSTERECTOMY    . BREAST LUMPECTOMY Right 12/17/2014  . BREAST LUMPECTOMY WITH RADIOACTIVE SEED LOCALIZATION Right 12/17/2014   Procedure: RIGHT BREAST RADIOACTIVE SEED GUIDED PARTIAL MASTECTOMY;  Surgeon: Erroll Luna, MD;  Location: [facility];  Service: General;  Laterality: Right;  . CESAREAN SECTION    . KNEE SURGERY     arthroscopic left    I have reviewed the social history and family history with the patient and they are unchanged from previous note.  ALLERGIES:  is allergic to atorvastatin, hydrochlorothiazide, macrobid [nitrofurantoin monohyd macro], naproxen, simvastatin, and zanaflex [tizanidine hcl].  MEDICATIONS:  Current Outpatient Medications  Medication Sig Dispense Refill  . Accu-Chek FastClix Lancets MISC 1 EACH BY DOES NOT APPLY ROUTE DAILY. USE FOR ACCU-CHEK GUIDE 102 each 3  . ACCU-CHEK GUIDE test strip 1 EACH BY OTHER ROUTE DAILY. 100 strip 3  . acetaminophen (TYLENOL) 650 MG CR tablet Take 650 mg by mouth every 8 (eight) hours as needed.      Marland Kitchen aspirin 81 MG tablet Take 81 mg by mouth daily.      . blood glucose meter kit and supplies KIT Dispense based on patient and insurance preference. Use up to twice daily as directed. 1 each 0  . Blood Glucose Monitoring Suppl (ACCU-CHEK GUIDE ME) w/Device KIT 1 each by Other route daily. 1 kit 0  . CALCIUM PO Take 1,000 mg by mouth daily.    . chlorthalidone (HYGROTON) 25 MG tablet TAKE 1 TABLET BY MOUTH EVERY DAY 90 tablet 1  . EPIPEN 2-PAK 0.3 MG/0.3ML SOAJ injection 0.3 mg. Reported on 05/01/2015  1  . fluticasone (FLONASE) 50 MCG/ACT nasal spray Place 1 spray into both nostrils daily. 16 g 5  . Melatonin 10 MG TABS Take 1 tablet by mouth at  bedtime.    . metFORMIN (GLUCOPHAGE-XR) 500 MG 24 hr tablet Take 500 mg by mouth in the morning and at bedtime.     . Multiple Vitamin (MULTIVITAMIN) tablet Take 1 tablet by mouth daily.      . NON FORMULARY EVERY OTHER WEEK ALLERGY INJECTIONS    . potassium chloride SA (KLOR-CON M20) 20 MEQ tablet Take 1 tablet (20 mEq total) by mouth 2 (two) times daily. (Patient taking differently: Take 20 mEq by mouth daily. ) 180 tablet 6  . PROAIR HFA 108 (90 BASE) MCG/ACT inhaler Inhale 1 puff into the lungs every 6 (six) hours as needed.   0  . traMADol (ULTRAM) 50 MG tablet Take 1 tablet (50 mg total) by mouth 2 (two) times daily as needed for severe pain. (Patient taking differently: Take 50 mg by mouth every 8 (eight) hours as needed for severe pain. ) 60 tablet 2   No current facility-administered medications for this visit.    PHYSICAL EXAMINATION:  Vitals:  12/04/19 0906  BP: (!) 149/83  Pulse: (!) 108  Resp: 18  Temp: (!) 97.3 F (36.3 C)  SpO2: 100%   Filed Weights   12/04/19 0906  Weight: 246 lb (111.6 kg)    GENERAL:alert, no distress and comfortable SKIN: No rash to exposed skin EYES:  sclera clear NECK: Without mass LYMPH:  no palpable cervical, supraclavicular, or axillary lymphadenopathy  LUNGS: clear with normal breathing effort HEART: regular rate & rhythm, no lower extremity edema. Musculoskeletal: No focal spinal or right hip tenderness.  Localized tenderness over the right upper lateral leg.  RLE slightly cooler to touch NEURO: alert & oriented x 3 with fluent speech Breast exam: Pendulous breasts without nipple discharge or inversion.  S/p right lumpectomy.  Incisions completely healed.  No palpable mass in either breast or axilla that I could appreciate.  LABORATORY DATA:  I have reviewed the data as listed CBC Latest Ref Rng & Units 09/18/2019 05/09/2018 12/17/2017  WBC 3.8 - 10.8 Thousand/uL 7.2 6.2 -  Hemoglobin 11.7 - 15.5 g/dL 12.8 12.8 13.6  Hematocrit 35 -  45 % 38.2 38.2 40.0  Platelets 140 - 400 Thousand/uL 196 195.0 -     CMP Latest Ref Rng & Units 11/28/2019 09/18/2019 05/09/2018  Glucose 65 - 99 mg/dL 148(H) 138(H) 160(H)  BUN 7 - 25 mg/dL 14 14 15   Creatinine 0.60 - 0.93 mg/dL 0.67 0.61 0.61  Sodium 135 - 146 mmol/L 140 140 140  Potassium 3.5 - 5.3 mmol/L 3.5 3.5 3.9  Chloride 98 - 110 mmol/L 101 100 100  CO2 20 - 32 mmol/L 31 28 32  Calcium 8.6 - 10.4 mg/dL 10.1 9.4 9.7  Total Protein 6.1 - 8.1 g/dL - 7.4 7.3  Total Bilirubin 0.2 - 1.2 mg/dL - 0.4 0.5  Alkaline Phos 39 - 117 U/L - - 69  AST 10 - 35 U/L - 15 13  ALT 6 - 29 U/L - 13 13      RADIOGRAPHIC STUDIES: I have personally reviewed the radiological images as listed and agreed with the findings in the report. No results found.   ASSESSMENT & PLAN: Darlene Mclaughlin is a 69 y.o. female with   1. right breast DCIS, ER/PR strongly positive -She was diagnosed in 10/2014. She is s/p right breast lumpectomy. Adjuvant radiation not recommended due to lack of residual malignancy at time of definitive surgery -Given her ER/PR positive disease, she started on anastrozole for chemoprevention in 03/2015 but stopped in July 2019 due to worsening arthralgia.  -11/2018 mammogram negative -Continue surveillance  2. Bone Health -DEXA in February 2017 showed osteopenia, no high risk for fracture.  She was on calcium, multivitamin, and vitamin D -11/2017 DEXA improved and normal with lowest t-score -0.9 -We will repeat 11/2019 with mammogram -I encouraged her to increase physical activity as tolerated  3.  Arthritis, spinal stenosis, right leg pain -Her arthritis was mainly in the wrist, previous imaging from at least 2016 shows lumbar spinal stenosis -She developed right leg pain in 10/2019, similar to when she had a reaction to statins in 2019  -Doppler negative for DVT on 11/12/2019  -x-rays thus far show mild arthritis, not felt to explain her severe pain and difficulty with  ambulating.   -Vascular study pending on 10/13 -She has been referred to PT, Ortho consult in 12/2019 -Continue work-up per PCP  4. Hypertension, diabetes, arthritis, obesity, hypokalemia secondary to chlorthalidone -Continue to follow up with her primary care physician   Disposition:  Darlene Mclaughlin is clinically doing well from a breast cancer/DCIS standpoint.  Breast exam is benign.  CBC and CMP from 7/21 are unremarkable.  No clinical concern for recurrence.   She has her annual mammogram scheduled on 12/10/2019, I recommend to repeat DEXA that day as well, last done 11/2017 which was normal.   She will continue work-up per PCP for the right upper leg pain.  Vascular studies pending.  She has been referred to PT.  Ortho consult is scheduled in 12/2019.   Continue breast cancer surveillance.  Routine office visit in 1 year, or sooner if needed.  All questions were answered. The patient knows to call the clinic with any problems, questions or concerns. No barriers to learning were detected.  Total encounter time was 30 minutes.     Alla Feeling, NP 12/04/19

## 2019-12-04 ENCOUNTER — Encounter: Payer: Self-pay | Admitting: Nurse Practitioner

## 2019-12-04 ENCOUNTER — Inpatient Hospital Stay: Payer: Medicare PPO | Attending: Nurse Practitioner | Admitting: Nurse Practitioner

## 2019-12-04 ENCOUNTER — Other Ambulatory Visit: Payer: Self-pay

## 2019-12-04 ENCOUNTER — Telehealth: Payer: Self-pay | Admitting: Nurse Practitioner

## 2019-12-04 VITALS — BP 149/83 | HR 108 | Temp 97.3°F | Resp 18 | Ht 63.5 in | Wt 246.0 lb

## 2019-12-04 DIAGNOSIS — E119 Type 2 diabetes mellitus without complications: Secondary | ICD-10-CM | POA: Insufficient documentation

## 2019-12-04 DIAGNOSIS — Z86000 Personal history of in-situ neoplasm of breast: Secondary | ICD-10-CM | POA: Diagnosis not present

## 2019-12-04 DIAGNOSIS — Z79899 Other long term (current) drug therapy: Secondary | ICD-10-CM | POA: Diagnosis not present

## 2019-12-04 DIAGNOSIS — Z7982 Long term (current) use of aspirin: Secondary | ICD-10-CM | POA: Diagnosis not present

## 2019-12-04 DIAGNOSIS — E876 Hypokalemia: Secondary | ICD-10-CM | POA: Diagnosis not present

## 2019-12-04 DIAGNOSIS — D0511 Intraductal carcinoma in situ of right breast: Secondary | ICD-10-CM

## 2019-12-04 DIAGNOSIS — K219 Gastro-esophageal reflux disease without esophagitis: Secondary | ICD-10-CM | POA: Diagnosis not present

## 2019-12-04 DIAGNOSIS — M8580 Other specified disorders of bone density and structure, unspecified site: Secondary | ICD-10-CM | POA: Insufficient documentation

## 2019-12-04 DIAGNOSIS — I10 Essential (primary) hypertension: Secondary | ICD-10-CM | POA: Diagnosis not present

## 2019-12-04 DIAGNOSIS — Z7984 Long term (current) use of oral hypoglycemic drugs: Secondary | ICD-10-CM | POA: Diagnosis not present

## 2019-12-04 DIAGNOSIS — Z170 Estrogen receptor positive status [ER+]: Secondary | ICD-10-CM | POA: Insufficient documentation

## 2019-12-04 DIAGNOSIS — J45909 Unspecified asthma, uncomplicated: Secondary | ICD-10-CM | POA: Insufficient documentation

## 2019-12-04 NOTE — Telephone Encounter (Signed)
Scheduled per 10/12 los. Printed avs and calendar for pt.

## 2019-12-05 ENCOUNTER — Ambulatory Visit (HOSPITAL_COMMUNITY)
Admission: RE | Admit: 2019-12-05 | Discharge: 2019-12-05 | Disposition: A | Discharge status: Home / Self Care | Payer: Medicare PPO | Source: Ambulatory Visit | Attending: Cardiology | Admitting: Cardiology

## 2019-12-05 DIAGNOSIS — E119 Type 2 diabetes mellitus without complications: Secondary | ICD-10-CM | POA: Insufficient documentation

## 2019-12-05 DIAGNOSIS — I10 Essential (primary) hypertension: Secondary | ICD-10-CM | POA: Insufficient documentation

## 2019-12-05 DIAGNOSIS — R202 Paresthesia of skin: Secondary | ICD-10-CM | POA: Diagnosis not present

## 2019-12-05 DIAGNOSIS — M79651 Pain in right thigh: Secondary | ICD-10-CM | POA: Diagnosis not present

## 2019-12-05 NOTE — Progress Notes (Signed)
Arterial circulation  evaluation   was normal and not impaired  . At rest

## 2019-12-10 ENCOUNTER — Other Ambulatory Visit: Payer: Self-pay

## 2019-12-10 ENCOUNTER — Ambulatory Visit
Admission: RE | Admit: 2019-12-10 | Discharge: 2019-12-10 | Disposition: A | Discharge status: Home / Self Care | Payer: Medicare PPO | Source: Ambulatory Visit | Attending: Hematology | Admitting: Hematology

## 2019-12-10 DIAGNOSIS — R928 Other abnormal and inconclusive findings on diagnostic imaging of breast: Secondary | ICD-10-CM | POA: Diagnosis not present

## 2019-12-10 DIAGNOSIS — J3089 Other allergic rhinitis: Secondary | ICD-10-CM | POA: Diagnosis not present

## 2019-12-10 DIAGNOSIS — J301 Allergic rhinitis due to pollen: Secondary | ICD-10-CM | POA: Diagnosis not present

## 2019-12-10 DIAGNOSIS — Z9889 Other specified postprocedural states: Secondary | ICD-10-CM

## 2019-12-10 DIAGNOSIS — Z853 Personal history of malignant neoplasm of breast: Secondary | ICD-10-CM

## 2019-12-16 ENCOUNTER — Other Ambulatory Visit: Payer: Self-pay | Admitting: Internal Medicine

## 2019-12-16 DIAGNOSIS — M545 Low back pain, unspecified: Secondary | ICD-10-CM

## 2019-12-16 DIAGNOSIS — G8929 Other chronic pain: Secondary | ICD-10-CM

## 2019-12-25 DIAGNOSIS — J3089 Other allergic rhinitis: Secondary | ICD-10-CM | POA: Diagnosis not present

## 2019-12-25 DIAGNOSIS — J301 Allergic rhinitis due to pollen: Secondary | ICD-10-CM | POA: Diagnosis not present

## 2019-12-27 DIAGNOSIS — J3081 Allergic rhinitis due to animal (cat) (dog) hair and dander: Secondary | ICD-10-CM | POA: Diagnosis not present

## 2019-12-27 DIAGNOSIS — J301 Allergic rhinitis due to pollen: Secondary | ICD-10-CM | POA: Diagnosis not present

## 2020-01-05 ENCOUNTER — Other Ambulatory Visit: Payer: Self-pay | Admitting: Internal Medicine

## 2020-01-09 DIAGNOSIS — J301 Allergic rhinitis due to pollen: Secondary | ICD-10-CM | POA: Diagnosis not present

## 2020-01-09 DIAGNOSIS — J3089 Other allergic rhinitis: Secondary | ICD-10-CM | POA: Diagnosis not present

## 2020-01-11 DIAGNOSIS — J3089 Other allergic rhinitis: Secondary | ICD-10-CM | POA: Diagnosis not present

## 2020-01-11 DIAGNOSIS — J301 Allergic rhinitis due to pollen: Secondary | ICD-10-CM | POA: Diagnosis not present

## 2020-01-14 DIAGNOSIS — J3089 Other allergic rhinitis: Secondary | ICD-10-CM | POA: Diagnosis not present

## 2020-01-14 DIAGNOSIS — J301 Allergic rhinitis due to pollen: Secondary | ICD-10-CM | POA: Diagnosis not present

## 2020-01-15 ENCOUNTER — Other Ambulatory Visit: Payer: Self-pay

## 2020-01-15 ENCOUNTER — Ambulatory Visit: Payer: Medicare PPO | Attending: Internal Medicine | Admitting: Physical Therapy

## 2020-01-15 ENCOUNTER — Encounter: Payer: Self-pay | Admitting: Physical Therapy

## 2020-01-15 DIAGNOSIS — M6281 Muscle weakness (generalized): Secondary | ICD-10-CM | POA: Diagnosis not present

## 2020-01-15 DIAGNOSIS — M5441 Lumbago with sciatica, right side: Secondary | ICD-10-CM | POA: Insufficient documentation

## 2020-01-15 DIAGNOSIS — R2689 Other abnormalities of gait and mobility: Secondary | ICD-10-CM | POA: Insufficient documentation

## 2020-01-15 DIAGNOSIS — R293 Abnormal posture: Secondary | ICD-10-CM | POA: Diagnosis not present

## 2020-01-15 NOTE — Therapy (Signed)
[facility] [address], [place], [place], [address] Phone: [phone]   Fax:  [phone]  Physical Therapy Evaluation  Patient Details  Name: Darlene Mclaughlin MRN: [id number] Date of Birth: 05/16/1950 Referring Provider (PT): Isaac Bliss, Rayford Halsted, MD   Encounter Date: 01/15/2020   PT End of Session - 01/15/20 1332    Visit Number 1    Date for PT Re-Evaluation 04/08/20    Authorization Type [employer]    Authorization Time Period submitting for auth 01/15/20 to 04/09/19    Authorization - Visit Number 1    Authorization - Number of Visits 12    Progress Note Due on Visit 10    PT Start Time 5809    PT Stop Time 1230    PT Time Calculation (min) 45 min    Activity Tolerance Patient limited by pain    Behavior During Therapy [facility] for tasks assessed/performed           Past Medical History:  Diagnosis Date  . ALLERGIC RHINITIS 05/15/2008  . Anemia   . ASTHMA 05/15/2008  . Blood transfusion without reported diagnosis 1991   anemia  . Breast cancer ([place]) 2016   right breast  . Cancer ([place])    DCIS right breast  . Diabetes mellitus without complication ([place])   . GERD (gastroesophageal reflux disease)   . Headache(784.0) 05/15/2008  . HYPERTENSION 05/15/2008  . IMPAIRED GLUCOSE TOLERANCE 05/15/2008  . Obesity   . OSTEOARTHRITIS 05/15/2008   back    Past Surgical History:  Procedure Laterality Date  . ABDOMINAL HYSTERECTOMY    . BREAST LUMPECTOMY Right 12/17/2014  . BREAST LUMPECTOMY WITH RADIOACTIVE SEED LOCALIZATION Right 12/17/2014   Procedure: RIGHT BREAST RADIOACTIVE SEED GUIDED PARTIAL MASTECTOMY;  Surgeon: Erroll Luna, MD;  Location: [facility];  Service: General;  Laterality: Right;  . CESAREAN SECTION    . KNEE SURGERY     arthroscopic left    There were no vitals filed for this visit.    Subjective Assessment - 01/15/20 1150    Subjective Pt developed soreness  in Sept 2021 in Rt leg that is progressively worsening.  Pain is in anterior hip and thigh to knee.  The pain is there in Lt leg but to lesser degree.  Pt has noticed some back pain.    Pertinent History complex medical history    Limitations Sitting;Standing;Walking;House hold activities;Lifting    How long can you sit comfortably? 5 min    How long can you stand comfortably? 1 min    How long can you walk comfortably? household distances    Diagnostic tests xray pelvis and femur, LE doppler and ankle-brachial index: mild degnerative changes in lumbar spine and Rt medial knee and patellofemoral compartment    Currently in Pain? Yes    Pain Score 8    can be 10/10   Pain Location Leg    Pain Orientation Right;Anterior    Pain Descriptors / Indicators Aching;Dull    Pain Type Acute pain    Pain Onset More than a month ago    Pain Frequency Constant    Aggravating Factors  standing/walking, sitting, household tasks    Pain Relieving Factors nothing yet    Effect of Pain on Daily Activities cook, clean, transfers, gait              [facility] PT Assessment - 01/15/20 0001      Assessment   Medical Diagnosis  M79.604 (ICD-10-CM) - Right leg pain    Referring Provider (PT) Isaac Bliss, Rayford Halsted, MD    Onset Date/Surgical Date --   Sept 2021   Hand Dominance Right    Next MD Visit Feb 27 2020    Prior Therapy no      Precautions   Precautions None      Restrictions   Weight Bearing Restrictions No      Balance Screen   Has the patient fallen in the past 6 months No      [place] residence    [place] One level      Prior Function   Level of Independence Independent with household mobility with device;Independent with community mobility with device;Independent    Vocation Retired    Therapist, nutritional   Overall Cognitive Status Within Functional Limits  for tasks assessed      Observation/Other Assessments   Focus on Therapeutic Outcomes (FOTO)  87% goal 58%      Functional Tests   Functional tests Sit to Stand      Sit to Stand   Comments limited weight shift into Rt LE      Posture/Postural Control   Posture/Postural Control Postural limitations    Postural Limitations Flexed trunk;Rounded Shoulders;Forward head;Increased lumbar lordosis      ROM / Strength   AROM / PROM / Strength AROM;PROM;Strength      AROM   Overall AROM Comments signif pain in standing, not formally assessed due to back and bil LE pain with all movement    AROM Assessment Site Lumbar    Lumbar Flexion --    Lumbar Extension unable to stand tall from flexed trunk without pain    Lumbar - Right Side Bend --    Lumbar - Left Side Bend --      PROM   Overall PROM Comments attempted to assess Rt hip P/ROM but signif guarding and pain, cramping in Rt anterior hip      Strength   Overall Strength Comments Rt ankle DF 3/5, core 3/5, Rt hip and knee 4-/5, Lt LE 4+/5      Palpation   Palpation comment Pt reported relief in Lt SL with manual distraction of Rt sided lumbar spine      Ambulation/Gait   Ambulation/Gait Yes    Ambulation/Gait Assistance 6: Modified independent (Device/Increase time)    Assistive device Straight cane   in Rt UE   Gait Pattern Step-through pattern;Decreased weight shift to right;Antalgic;Lateral trunk lean to left;Trunk flexed    Gait Comments slow guarded gait with signif pain on Rt LE weight bearing                      Objective measurements completed on examination: See above findings.               PT Education - 01/15/20 1227    Education Details Access Code: [email or web address]    Person(s) Educated Patient    Methods Explanation;Demonstration;Handout    Comprehension Verbalized understanding;Returned demonstration            PT Short Term Goals - 01/15/20 1349      PT SHORT TERM GOAL #1   Title  Pt will demo improved use of TrA with bed mobility and transfers.    Time 2  Period Weeks    Status New    Target Date 01/29/20      PT SHORT TERM GOAL #2   Title Pt will be educated on sleeping positions and report improved sleep with less pain at night by at least 20%    Time 4    Period Weeks    Status New    Target Date 02/12/20      PT SHORT TERM GOAL #3   Title Pt will demo symmetry in bil WB with sit to stand.    Time 4    Period Weeks    Status New    Target Date 02/12/20      PT SHORT TERM GOAL #4   Title Improved Rt ankle DF to at least 4-/5 with improved heel strike on Rt with gait.    Time 4    Period Weeks    Status New    Target Date 02/12/20      PT SHORT TERM GOAL #5   Title Reduce FOTO to </= 70%    Baseline 87%    Time 6    Period Weeks    Status New    Target Date 02/26/20             PT Long Term Goals - 01/15/20 1352      PT LONG TERM GOAL #1   Title Pt will be able to stand x 20 min with min pain so that she may cook meals.    Time 12    Period Weeks    Status New    Target Date 04/08/20      PT LONG TERM GOAL #2   Title Pt will be able to participate in 6 min walk test with improved gait symmetry for increased tolerance of short distance community ambulation for appointments and shopping.    Time 12    Period Weeks    Status New    Target Date 04/08/20      PT LONG TERM GOAL #3   Title Pt with report at least 50% reduction in pain with daily tasks and sleep    Time 12    Period Weeks    Status New    Target Date 04/08/20      PT LONG TERM GOAL #4   Title Pt will improve functional strength of Rt LE and core to at least 4+/5 for improved tolerance of household tasks.    Time 12    Period Weeks    Status New    Target Date 04/08/20      PT LONG TERM GOAL #5   Title Reduce FOTO to </= 58% to demo less limitation.    Baseline 87%    Time 12    Period Weeks    Status New    Target Date 04/08/20                   Plan - 01/15/20 1333    Clinical Impression Statement Pt is a pleasant 69yo female complex medical history presenting with severe Rt anterior hip and thigh pain which started in early Sept without known cause.  Pain limits her from standing/walking > 1 min or sitting > 5 min.  She has similar pain in Lt LE to a lesser degree.  She also notes signif back pain which has been on/off for years.  She was diagnosed with spinal stenosis in 2012 per chart review.  Pain is constant and disrupts sleep.  Pt presents with limited ability to bear weight through Rt LE with transfers and gait, using a SPC and slow altaglic guarded gait pattern.  Exam was limited by pain today.  Pt has flexed trunk with pain in back and LEs with all trunk motions.  She had severe pain with attempts to assess Rt hip so this was not formally assessed.  She has weakness in Rt ankle DF 3/5, and weakness in Rt knee and hip 4-/5 assessed seated in chair.  She reported relief laying on Lt side with PT gapping Rt lumbar spine.  PT initiated SL TA contractions and encouraged use of TA with all transitional movements and daily activities.  FOTO is 87% which is significant for level of limitation by pain (goal 58%).  Pt will benefit from skilled PT to address pain and improve strength and mobility for improved QOL and daily activity tolerance.    Personal Factors and Comorbidities Age;Comorbidity 1;Comorbidity 2;Fitness;Comorbidity 3+    Comorbidities DM, HTN, obesity    Examination-Activity Limitations Bathing;Locomotion Level;Transfers;Bed Mobility;Bend;Sit;Squat;Stand;Carry;Dressing;Lift    Examination-Participation Restrictions Community Activity;Cleaning;Meal Prep;Laundry;Shop    Stability/Clinical Decision Making Evolving/Moderate complexity    Clinical Decision Making Moderate    Rehab Potential Good    PT Frequency 2x / week    PT Duration 12 weeks    PT Treatment/Interventions ADLs/Self Care Home Management;Aquatic  Therapy;Cryotherapy;Electrical Stimulation;Moist Heat;Traction;Neuromuscular re-education;Therapeutic exercise;Therapeutic activities;Functional mobility training;Gait training;Stair training;Patient/family education;Manual techniques;Dry needling;Passive range of motion;Taping;Spinal Manipulations;Joint Manipulations    PT Next Visit Plan add seated toe raises and seated LE ther ex for core and LEs as tol, Lt SL Rt lumbar gapping if helpful, work on sit to stand from elevated mat table    PT Home Exercise Plan Access Code: [place] and Agree with Plan of Care Patient           Patient will benefit from skilled therapeutic intervention in order to improve the following deficits and impairments:  Abnormal gait, Decreased range of motion, Difficulty walking, Obesity, Increased muscle spasms, Pain, Postural dysfunction, Decreased strength, Decreased mobility, Impaired flexibility, Improper body mechanics, Decreased activity tolerance  Visit Diagnosis: Acute bilateral low back pain with right-sided sciatica - Plan: PT plan of care cert/re-cert  Muscle weakness (generalized) - Plan: PT plan of care cert/re-cert  Abnormal posture - Plan: PT plan of care cert/re-cert  Other abnormalities of gait and mobility - Plan: PT plan of care cert/re-cert     Problem List Patient Active Problem List   Diagnosis Date Noted  . Statin intolerance 01/16/2018  . Bilateral leg edema 12/21/2017  . History of colonic polyps 10/18/2017  . Dyslipidemia 10/18/2017  . Essential hypertension 03/26/2016  . Family history of colon cancer 03/26/2016  . Osteopenia 08/28/2015  . 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 Spinal stenosis of lumbar region 12/05/2013  . Morbid obesity ([place]) 12/19/2012  . Diabetes mellitus with coincident hypertension ([place]) 05/15/2008  . Allergic rhinitis 05/15/2008  . Asthma, mild intermittent 05/15/2008  . Osteoarthritis 05/15/2008  . HEADACHE  05/15/2008    Baruch Merl, PT 01/15/20 1:59 PM   [facility] [address] [place], [place], [address] Phone: [phone]   Fax:  [phone]  Name: Darlene Mclaughlin MRN: [id number] Date of Birth: 12-02-1950

## 2020-01-15 NOTE — Patient Instructions (Signed)
Access Code: [email or web address] URL: [email or web address] Date: 01/15/2020 Prepared by: Venetia Night Sharlene Mccluskey  Exercises Sidelying Transversus Abdominis Bracing - 3 x daily - 7 x weekly - 1 sets - 10 reps - 10 hold

## 2020-01-16 ENCOUNTER — Other Ambulatory Visit: Payer: Self-pay | Admitting: Internal Medicine

## 2020-01-16 DIAGNOSIS — J301 Allergic rhinitis due to pollen: Secondary | ICD-10-CM | POA: Diagnosis not present

## 2020-01-16 DIAGNOSIS — J3089 Other allergic rhinitis: Secondary | ICD-10-CM | POA: Diagnosis not present

## 2020-01-21 ENCOUNTER — Other Ambulatory Visit: Payer: Self-pay

## 2020-01-21 ENCOUNTER — Ambulatory Visit: Payer: Medicare PPO

## 2020-01-21 DIAGNOSIS — M5441 Lumbago with sciatica, right side: Secondary | ICD-10-CM

## 2020-01-21 DIAGNOSIS — R2689 Other abnormalities of gait and mobility: Secondary | ICD-10-CM | POA: Diagnosis not present

## 2020-01-21 DIAGNOSIS — M6281 Muscle weakness (generalized): Secondary | ICD-10-CM | POA: Diagnosis not present

## 2020-01-21 DIAGNOSIS — R293 Abnormal posture: Secondary | ICD-10-CM | POA: Diagnosis not present

## 2020-01-21 DIAGNOSIS — J301 Allergic rhinitis due to pollen: Secondary | ICD-10-CM | POA: Diagnosis not present

## 2020-01-21 DIAGNOSIS — J3089 Other allergic rhinitis: Secondary | ICD-10-CM | POA: Diagnosis not present

## 2020-01-21 NOTE — Patient Instructions (Signed)
Access Code: [email or web address] URL: [email or web address] Date: 01/21/2020 Prepared by: Claiborne Billings  Exercises  Seated Heel Raise - 2 x daily - 7 x weekly - 2 sets - 10 reps Seated Toe Raise - 2 x daily - 7 x weekly - 2 sets - 10 reps Seated Long Arc Quad - 2 x daily - 7 x weekly - 2 sets - 10 reps Seated March - 2 x daily - 7 x weekly - 2 sets - 10 reps

## 2020-01-21 NOTE — Therapy (Signed)
[facility] [address] [place], [place], [address] Phone: [phone]   Fax:  [phone]  Physical Therapy Treatment  Patient Details  Name: Darlene Mclaughlin MRN: [id number] Date of Birth: [birth year] Referring Provider (PT): Isaac Bliss, Rayford Halsted, MD   Encounter Date: 01/21/2020   PT End of Session - 01/21/20 1009    Visit Number 2    Date for PT Re-Evaluation 04/08/20    Authorization Type [employer]    Authorization Time Period submitting for auth 01/15/20 to 04/09/19    Authorization - Visit Number 2    Authorization - Number of Visits 12    Progress Note Due on Visit 10    PT Start Time 0931    PT Stop Time 1012    PT Time Calculation (min) 41 min    Activity Tolerance Patient tolerated treatment well    Behavior During Therapy [facility] for tasks assessed/performed           Past Medical History:  Diagnosis Date  . ALLERGIC RHINITIS 05/15/2008  . Anemia   . ASTHMA 05/15/2008  . Blood transfusion without reported diagnosis 1991   anemia  . Breast cancer ([place]) 2016   right breast  . 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 Diabetes mellitus without complication ([place])   . GERD (gastroesophageal reflux disease)   . Headache(784.0) 05/15/2008  . HYPERTENSION 05/15/2008  . IMPAIRED GLUCOSE TOLERANCE 05/15/2008  . Obesity   . OSTEOARTHRITIS 05/15/2008   back    Past Surgical History:  Procedure Laterality Date  . ABDOMINAL HYSTERECTOMY    . BREAST LUMPECTOMY Right 12/17/2014  . BREAST LUMPECTOMY WITH RADIOACTIVE SEED LOCALIZATION Right 12/17/2014   Procedure: RIGHT BREAST RADIOACTIVE SEED GUIDED PARTIAL MASTECTOMY;  Surgeon: Erroll Luna, MD;  Location: [place];  Service: General;  Laterality: Right;  . CESAREAN SECTION    . KNEE SURGERY     arthroscopic left    There were no vitals filed for this visit.   Subjective Assessment - 01/21/20 0935    Subjective I am feeling  better overall today.  the pain is always there.  I had a lot of pain after the evaluation.    Diagnostic tests xray pelvis and femur, LE doppler and ankle-brachial index: mild degnerative changes in lumbar spine and Rt medial knee and patellofemoral compartment    Currently in Pain? Yes    Pain Score 8     Pain Location Leg    Pain Orientation Right;Anterior    Pain Descriptors / Indicators Aching;Dull    Pain Type Acute pain    Pain Onset More than a month ago    Pain Frequency Constant    Aggravating Factors  standing, walking, sitting, household tasks    Pain Relieving Factors nothing helps                             [facility] Adult PT Treatment/Exercise - 01/21/20 0001      Exercises   Exercises Knee/Hip;Ankle      Knee/Hip Exercises: Stretches   Active Hamstring Stretch Right;Left;2 reps;20 seconds      Knee/Hip Exercises: Aerobic   Nustep Level 2x 8 minutes- Pt present to discuss progress      Knee/Hip Exercises: Seated   Long Arc Quad Strengthening;Both;1 set;10 reps    Marching Strengthening;Both;10 reps      Ankle Exercises: Seated   Heel  Raises 20 reps    Toe Raise 20 reps    Toe Raise Limitations no active motion on the Rt-pt moves into supination                  PT Education - 01/21/20 0957    Education Details Access Code: [email or web address]    Person(s) Educated Patient    Methods Explanation;Demonstration;Handout    Comprehension Verbalized understanding;Returned demonstration            PT Short Term Goals - 01/15/20 1349      PT SHORT TERM GOAL #1   Title Pt will demo improved use of TrA with bed mobility and transfers.    Time 2    Period Weeks    Status New    Target Date 01/29/20      PT SHORT TERM GOAL #2   Title Pt will be educated on sleeping positions and report improved sleep with less pain at night by at least 20%    Time 4    Period Weeks    Status New    Target Date 02/12/20      PT SHORT TERM GOAL #3   Title  Pt will demo symmetry in bil WB with sit to stand.    Time 4    Period Weeks    Status New    Target Date 02/12/20      PT SHORT TERM GOAL #4   Title Improved Rt ankle DF to at least 4-/5 with improved heel strike on Rt with gait.    Time 4    Period Weeks    Status New    Target Date 02/12/20      PT SHORT TERM GOAL #5   Title Reduce FOTO to </= 70%    Baseline 87%    Time 6    Period Weeks    Status New    Target Date 02/26/20             PT Long Term Goals - 01/15/20 1352      PT LONG TERM GOAL #1   Title Pt will be able to stand x 20 min with min pain so that she may cook meals.    Time 12    Period Weeks    Status New    Target Date 04/08/20      PT LONG TERM GOAL #2   Title Pt will be able to participate in 6 min walk test with improved gait symmetry for increased tolerance of short distance community ambulation for appointments and shopping.    Time 12    Period Weeks    Status New    Target Date 04/08/20      PT LONG TERM GOAL #3   Title Pt with report at least 50% reduction in pain with daily tasks and sleep    Time 12    Period Weeks    Status New    Target Date 04/08/20      PT LONG TERM GOAL #4   Title Pt will improve functional strength of Rt LE and core to at least 4+/5 for improved tolerance of household tasks.    Time 12    Period Weeks    Status New    Target Date 04/08/20      PT LONG TERM GOAL #5   Title Reduce FOTO to </= 58% to demo less limitation.    Baseline 87%    Time 12  Period Weeks    Status New    Target Date 04/08/20                 Plan - 01/21/20 1008    Clinical Impression Statement Pt with first time follow-up after evaluation.  Pt arrived with 8/10 Rt LE pain.  Pt tolerated seated exercise today and Nustep without an increase in pain.  Pt was not able to perform toe raise on the Rt without substitution.  Pt required supervision for technique and to monitor for pain.  Pt will continue to benefit from  skilled PT to address Rt LE and to improve functional mobility.    Rehab Potential Good    PT Frequency 2x / week    PT Duration 12 weeks    PT Treatment/Interventions ADLs/Self Care Home Management;Aquatic Therapy;Cryotherapy;Electrical Stimulation;Moist Heat;Traction;Neuromuscular re-education;Therapeutic exercise;Therapeutic activities;Functional mobility training;Gait training;Stair training;Patient/family education;Manual techniques;Dry needling;Passive range of motion;Taping;Spinal Manipulations;Joint Manipulations    PT Next Visit Plan work on sit to stand, addaday to Rt gluteals, flexion based exercise    PT Home Exercise Plan Access Code: [email or web address]    Consulted and Agree with Plan of Care Patient           Patient will benefit from skilled therapeutic intervention in order to improve the following deficits and impairments:  Abnormal gait, Decreased range of motion, Difficulty walking, Obesity, Increased muscle spasms, Pain, Postural dysfunction, Decreased strength, Decreased mobility, Impaired flexibility, Improper body mechanics, Decreased activity tolerance  Visit Diagnosis: Muscle weakness (generalized)  Acute bilateral low back pain with right-sided sciatica  Abnormal posture  Other abnormalities of gait and mobility     Problem List Patient Active Problem List   Diagnosis Date Noted  . Statin intolerance 01/16/2018  . Bilateral leg edema 12/21/2017  . History of colonic polyps 10/18/2017  . Dyslipidemia 10/18/2017  . Essential hypertension 03/26/2016  . Family history of colon cancer 03/26/2016  . Osteopenia 08/28/2015  . 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 Spinal stenosis of lumbar region 12/05/2013  . Morbid obesity ([place]) 12/19/2012  . Diabetes mellitus with coincident hypertension ([place]) 05/15/2008  . Allergic rhinitis 05/15/2008  . Asthma, mild intermittent 05/15/2008  . Osteoarthritis 05/15/2008  . HEADACHE 05/15/2008      Sigurd Sos, PT 01/21/20 10:13 AM  [facility] [address] [place], [place], [address] Phone: [phone]   Fax:  [phone]  Name: Darlene Mclaughlin MRN: [id number] Date of Birth: 1950-09-07

## 2020-01-23 ENCOUNTER — Other Ambulatory Visit: Payer: Self-pay

## 2020-01-23 ENCOUNTER — Encounter: Payer: Self-pay | Admitting: Internal Medicine

## 2020-01-23 ENCOUNTER — Ambulatory Visit: Payer: Medicare PPO | Admitting: Internal Medicine

## 2020-01-23 VITALS — BP 120/70 | HR 90 | Temp 98.3°F | Wt 242.5 lb

## 2020-01-23 DIAGNOSIS — I10 Essential (primary) hypertension: Secondary | ICD-10-CM

## 2020-01-23 DIAGNOSIS — Z789 Other specified health status: Secondary | ICD-10-CM

## 2020-01-23 DIAGNOSIS — M5441 Lumbago with sciatica, right side: Secondary | ICD-10-CM

## 2020-01-23 DIAGNOSIS — E785 Hyperlipidemia, unspecified: Secondary | ICD-10-CM

## 2020-01-23 DIAGNOSIS — E119 Type 2 diabetes mellitus without complications: Secondary | ICD-10-CM

## 2020-01-23 LAB — POCT GLYCOSYLATED HEMOGLOBIN (HGB A1C): Hemoglobin A1C: 6.5 % — AB (ref 4.0–5.6)

## 2020-01-23 MED ORDER — METHYLPREDNISOLONE ACETATE 40 MG/ML IJ SUSP
40.0000 mg | Freq: Once | INTRAMUSCULAR | Status: AC
  Administered 2020-01-23: 40 mg via INTRAMUSCULAR

## 2020-01-23 MED ORDER — PREDNISONE 10 MG (21) PO TBPK: ORAL_TABLET | ORAL | 0 refills | Status: DC

## 2020-01-23 NOTE — Progress Notes (Signed)
   Established Patient Office Visit     This visit occurred during the SARS-CoV-2 public health emergency.  Safety protocols were in place, including screening questions prior to the visit, additional usage of staff PPE, and extensive cleaning of exam room while observing appropriate contact time as indicated for disinfecting solutions.    CC/Reason for Visit: Continued right leg pain and now right-sided lower back pain  HPI: Darlene Mclaughlin is a 69 y.o. female who is coming in today for the above mentioned reasons. Past Medical History is significant for: Hypertension, diabetes with hyperlipidemia and statin intolerance among other things.  She had right leg radiculopathy at prior visit and was scheduled for physical therapy.  She has only had 2 sessions so far.  She has noticed significant increase in her right thigh lateral and posterior pain and now right lower back pain since starting PT.  She is having difficulty walking.  She denies any red flags such as bowel or bladder incontinence, saddle anesthesia.  Within the past couple months she has also had an arterial Doppler that showed intact circulation.  As well as a venous Doppler that was negative for DVT.   Past Medical/Surgical History: Past Medical History:  Diagnosis Date  . ALLERGIC RHINITIS 05/15/2008  . Anemia   . ASTHMA 05/15/2008  . Blood transfusion without reported diagnosis 1991   anemia  . Breast cancer ([facility]) 2016   right breast  . Cancer ([facility])    DCIS right breast  . Diabetes mellitus without complication ([facility])   . GERD (gastroesophageal reflux disease)   . Headache(784.0) 05/15/2008  . HYPERTENSION 05/15/2008  . IMPAIRED GLUCOSE TOLERANCE 05/15/2008  . Obesity   . OSTEOARTHRITIS 05/15/2008   back    Past Surgical History:  Procedure Laterality Date  . ABDOMINAL HYSTERECTOMY    . BREAST LUMPECTOMY Right 12/17/2014  . BREAST LUMPECTOMY WITH RADIOACTIVE SEED LOCALIZATION Right 12/17/2014   Procedure: RIGHT  BREAST RADIOACTIVE SEED GUIDED PARTIAL MASTECTOMY;  Surgeon: Thomas Cornett, MD;  Location: [facility];  Service: General;  Laterality: Right;  . CESAREAN SECTION    . KNEE SURGERY     arthroscopic left    Social History:  reports that she has never smoked. She has never used smokeless tobacco. She reports current alcohol use. She reports that she does not use drugs.  Allergies: Allergies  Allergen Reactions  . Atorvastatin   . Hydrochlorothiazide   . Macrobid [Nitrofurantoin Monohyd Macro]      Liver problems  . Naproxen   . Simvastatin     Hallucinations, muscle pains  . Zanaflex [Tizanidine Hcl] Other (See Comments)     Liver problems    Family History:  Family History  Problem Relation Age of Onset  . Diabetes Mother   . Hypertension Mother   . Breast cancer Mother 73       Breast cancer twice   . Hyperlipidemia Father   . Colon cancer Brother        4 th brother  . Diabetes Brother   . Pancreatic cancer Brother        middle brother  . Stomach cancer Neg Hx      Current Outpatient Medications:  .  Accu-Chek FastClix Lancets MISC, 1 EACH BY DOES NOT APPLY ROUTE DAILY. USE FOR ACCU-CHEK GUIDE, Disp: 102 each, Rfl: 3 .  ACCU-CHEK GUIDE test strip, 1 EACH BY OTHER ROUTE DAILY., Disp: 100 strip, Rfl: 3 .  acetaminophen (TYLENOL) 650 MG CR   tablet, Take 650 mg by mouth every 8 (eight) hours as needed.  , Disp: , Rfl:  .  aspirin 81 MG tablet, Take 81 mg by mouth daily.  , Disp: , Rfl:  .  blood glucose meter kit and supplies KIT, Dispense based on patient and insurance preference. Use up to twice daily as directed., Disp: 1 each, Rfl: 0 .  Blood Glucose Monitoring Suppl (ACCU-CHEK GUIDE ME) w/Device KIT, 1 each by Other route daily., Disp: 1 kit, Rfl: 0 .  CALCIUM PO, Take 1,000 mg by mouth daily., Disp: , Rfl:  .  chlorthalidone (HYGROTON) 25 MG tablet, TAKE 1 TABLET BY MOUTH EVERY DAY, Disp: 90 tablet, Rfl: 1 .  EPIPEN 2-PAK 0.3 MG/0.3ML SOAJ injection,  0.3 mg. Reported on 05/01/2015, Disp: , Rfl: 1 .  fluticasone (FLONASE) 50 MCG/ACT nasal spray, Place 1 spray into both nostrils daily., Disp: 16 g, Rfl: 5 .  Melatonin 10 MG TABS, Take 1 tablet by mouth at bedtime., Disp: , Rfl:  .  metFORMIN (GLUCOPHAGE-XR) 500 MG 24 hr tablet, TAKE 2 TABLETS (1,000 MG TOTAL) BY MOUTH DAILY WITH SUPPER., Disp: 180 tablet, Rfl: 1 .  Multiple Vitamin (MULTIVITAMIN) tablet, Take 1 tablet by mouth daily.  , Disp: , Rfl:  .  NON FORMULARY, EVERY OTHER WEEK ALLERGY INJECTIONS, Disp: , Rfl:  .  potassium chloride SA (KLOR-CON M20) 20 MEQ tablet, Take 1 tablet (20 mEq total) by mouth 2 (two) times daily. (Patient taking differently: Take 20 mEq by mouth daily. ), Disp: 180 tablet, Rfl: 6 .  PROAIR HFA 108 (90 BASE) MCG/ACT inhaler, Inhale 1 puff into the lungs every 6 (six) hours as needed. , Disp: , Rfl: 0 .  traMADol (ULTRAM) 50 MG tablet, TAKE 1 TABLET (50 MG TOTAL) BY MOUTH 2 (TWO) TIMES DAILY AS NEEDED FOR SEVERE PAIN., Disp: 60 tablet, Rfl: 2 .  predniSONE (STERAPRED UNI-PAK 21 TAB) 10 MG (21) TBPK tablet, Take as directed, Disp: 21 tablet, Rfl: 0  Current Facility-Administered Medications:  .  methylPREDNISolone acetate (DEPO-MEDROL) injection 40 mg, 40 mg, Intramuscular, Once, Isaac Bliss, Rayford Halsted, MD  Review of Systems:  Constitutional: Denies fever, chills, diaphoresis, appetite change and fatigue.  HEENT: Denies photophobia, eye pain, redness, hearing loss, ear pain, congestion, sore throat, rhinorrhea, sneezing, mouth sores, trouble swallowing, neck pain, neck stiffness and tinnitus.   Respiratory: Denies SOB, DOE, cough, chest tightness,  and wheezing.   Cardiovascular: Denies chest pain, palpitations and leg swelling.  Gastrointestinal: Denies nausea, vomiting, abdominal pain, diarrhea, constipation, blood in stool and abdominal distention.  Genitourinary: Denies dysuria, urgency, frequency, hematuria, flank pain and difficulty urinating.  Endocrine:  Denies: hot or cold intolerance, sweats, changes in hair or nails, polyuria, polydipsia. Musculoskeletal: Denies  joint swelling, arthralgias. Skin: Denies pallor, rash and wound.  Neurological: Denies dizziness, seizures, syncope,  light-headedness and headaches.  Hematological: Denies adenopathy. Easy bruising, personal or family bleeding history  Psychiatric/Behavioral: Denies suicidal ideation, mood changes, confusion, nervousness, sleep disturbance and agitation    Physical Exam: Vitals:   01/23/20 0817  BP: 120/70  Pulse: 90  Temp: 98.3 F (36.8 C)  TempSrc: Oral  SpO2: 99%  Weight: 242 lb 8 oz (110 kg)    Body mass index is 42.28 kg/m.   Constitutional: NAD, calm, comfortable Eyes: PERRL, lids and conjunctivae normal, wears corrective lenses ENMT: Mucous membranes are moist. Musculoskeletal: no clubbing / cyanosis. No joint deformity upper and lower extremities. Good ROM, no contractures. Normal muscle tone.  Psychiatric: Normal judgment and insight. Alert and oriented x 3. Normal mood.    Impression and Plan:  Acute right-sided low back pain with right-sided sciatica  -As anticipated with some acute worsening due to starting of PT, I have advised her to continue to be compliant with physical therapy sessions. -She will get Depo-Medrol injection in office today in addition to a 6-day prednisone taper to start tomorrow. -I do not see any warning signs concerning me enough to do an MRI today, however can consider if she fails to improve or has progressive symptoms over the next 8 to 12 weeks despite conservative management. -She will also do icing and as needed NSAIDs.  Diabetes mellitus without complication ([place]) -Is well controlled with an A1c of 6.5. -She has been advised to expect some elevated blood sugars due to ongoing steroid treatment.  Statin intolerance Dyslipidemia -Last LDL was 122, goal less than 70 given diabetes. -She has a severe statin intolerance  with myalgias.    Patient Instructions  -Nice seeing you today!!  -Steroid injection in office today.  -Start prednisone tomorrow as directed on package insert for 6 days.       Lelon Frohlich, MD [facility]

## 2020-01-23 NOTE — Patient Instructions (Signed)
-  Nice seeing you today!!  -Steroid injection in office today.  -Start prednisone tomorrow as directed on package insert for 6 days.

## 2020-01-25 ENCOUNTER — Ambulatory Visit: Payer: Medicare PPO | Admitting: Internal Medicine

## 2020-01-28 ENCOUNTER — Ambulatory Visit: Payer: Medicare PPO | Attending: Internal Medicine

## 2020-01-28 ENCOUNTER — Other Ambulatory Visit: Payer: Self-pay

## 2020-01-28 DIAGNOSIS — M5441 Lumbago with sciatica, right side: Secondary | ICD-10-CM

## 2020-01-28 DIAGNOSIS — M6281 Muscle weakness (generalized): Secondary | ICD-10-CM

## 2020-01-28 DIAGNOSIS — R293 Abnormal posture: Secondary | ICD-10-CM | POA: Diagnosis not present

## 2020-01-28 DIAGNOSIS — R2689 Other abnormalities of gait and mobility: Secondary | ICD-10-CM | POA: Diagnosis not present

## 2020-01-28 NOTE — Therapy (Signed)
[facility] [address] [place], [place], [address] Phone: [phone]   Fax:  [phone]  Physical Therapy Treatment  Patient Details  Name: Darlene Mclaughlin MRN: [id number] Date of Birth: [birth year] Referring Provider (PT): Isaac Bliss, Rayford Halsted, MD   Encounter Date: 01/28/2020   PT End of Session - 01/28/20 1140    Visit Number 3    Date for PT Re-Evaluation 04/08/20    Authorization Type [employer]    Authorization - Visit Number 3    Authorization - Number of Visits 12    Progress Note Due on Visit 10    PT Start Time 1102    PT Stop Time 1145    PT Time Calculation (min) 43 min    Activity Tolerance Patient tolerated treatment well    Behavior During Therapy [facility] for tasks assessed/performed           Past Medical History:  Diagnosis Date  . ALLERGIC RHINITIS 05/15/2008  . Anemia   . ASTHMA 05/15/2008  . Blood transfusion without reported diagnosis 1991   anemia  . Breast cancer ([place]) 2016   right breast  . Cancer ([place])    DCIS right breast  . Diabetes mellitus without complication ([place])   . GERD (gastroesophageal reflux disease)   . Headache(784.0) 05/15/2008  . HYPERTENSION 05/15/2008  . IMPAIRED GLUCOSE TOLERANCE 05/15/2008  . Obesity   . OSTEOARTHRITIS 05/15/2008   back    Past Surgical History:  Procedure Laterality Date  . ABDOMINAL HYSTERECTOMY    . BREAST LUMPECTOMY Right 12/17/2014  . BREAST LUMPECTOMY WITH RADIOACTIVE SEED LOCALIZATION Right 12/17/2014   Procedure: RIGHT BREAST RADIOACTIVE SEED GUIDED PARTIAL MASTECTOMY;  Surgeon: Erroll Luna, MD;  Location: [facility];  Service: General;  Laterality: Right;  . CESAREAN SECTION    . KNEE SURGERY     arthroscopic left    There were no vitals filed for this visit.   Subjective Assessment - 01/28/20 1104    Subjective I had a lot of pain after last session and had to see the MD the next day.  I got a  steroid injection and am now on oral steroids.    Diagnostic tests xray pelvis and femur, LE doppler and ankle-brachial index: mild degnerative changes in lumbar spine and Rt medial knee and patellofemoral compartment    Currently in Pain? Yes    Pain Score 7     Pain Location Leg    Pain Orientation Right;Anterior    Pain Descriptors / Indicators Aching;Dull    Pain Type Acute pain    Pain Onset More than a month ago    Pain Frequency Constant    Aggravating Factors  standing, walking, household tasks    Pain Relieving Factors gentle mobility                             [facility] Adult PT Treatment/Exercise - 01/28/20 0001      Knee/Hip Exercises: Stretches   Active Hamstring Stretch Right;Left;2 reps;20 seconds      Knee/Hip Exercises: Aerobic   Nustep Level 2x 8 minutes- Pt present to discuss progress      Knee/Hip Exercises: Seated   Long Arc Quad Strengthening;Both;1 set;10 reps    Marching Strengthening;Both;10 reps    Abd/Adduction Limitations rockerboard x 2 minutes      Manual Therapy   Manual Therapy Soft tissue mobilization  Manual therapy comments Addaday to Rt gluteals and quads                    PT Short Term Goals - 01/15/20 1349      PT SHORT TERM GOAL #1   Title Pt will demo improved use of TrA with bed mobility and transfers.    Time 2    Period Weeks    Status New    Target Date 01/29/20      PT SHORT TERM GOAL #2   Title Pt will be educated on sleeping positions and report improved sleep with less pain at night by at least 20%    Time 4    Period Weeks    Status New    Target Date 02/12/20      PT SHORT TERM GOAL #3   Title Pt will demo symmetry in bil WB with sit to stand.    Time 4    Period Weeks    Status New    Target Date 02/12/20      PT SHORT TERM GOAL #4   Title Improved Rt ankle DF to at least 4-/5 with improved heel strike on Rt with gait.    Time 4    Period Weeks    Status New    Target Date  02/12/20      PT SHORT TERM GOAL #5   Title Reduce FOTO to </= 70%    Baseline 87%    Time 6    Period Weeks    Status New    Target Date 02/26/20             PT Long Term Goals - 01/15/20 1352      PT LONG TERM GOAL #1   Title Pt will be able to stand x 20 min with min pain so that she may cook meals.    Time 12    Period Weeks    Status New    Target Date 04/08/20      PT LONG TERM GOAL #2   Title Pt will be able to participate in 6 min walk test with improved gait symmetry for increased tolerance of short distance community ambulation for appointments and shopping.    Time 12    Period Weeks    Status New    Target Date 04/08/20      PT LONG TERM GOAL #3   Title Pt with report at least 50% reduction in pain with daily tasks and sleep    Time 12    Period Weeks    Status New    Target Date 04/08/20      PT LONG TERM GOAL #4   Title Pt will improve functional strength of Rt LE and core to at least 4+/5 for improved tolerance of household tasks.    Time 12    Period Weeks    Status New    Target Date 04/08/20      PT LONG TERM GOAL #5   Title Reduce FOTO to </= 58% to demo less limitation.    Baseline 87%    Time 12    Period Weeks    Status New    Target Date 04/08/20                 Plan - 01/28/20 1138    Clinical Impression Statement Pt saw MD after last session and had to get an injection and start and oral steroid  due to significant Rt LE pain.  Pt is feeling overall better but continues to be limited regarding mobility and strength of Rt LE.  Pt has been able to do her exercises with moderate compliance since her last session.  Pt continues to demonstrate Rt ankle weakness and limited A/ROM into DF.  Pt tolerated very gentle activity in the clinic today and reported improved symptoms after Addaday to [place].  Pt will continue to benefit from skilled PT to address Rt LE pain, weakness and limited endurance for functional mobility.    Rehab  Potential Good    PT Frequency 2x / week    PT Duration 12 weeks    PT Treatment/Interventions ADLs/Self Care Home Management;Aquatic Therapy;Cryotherapy;Electrical Stimulation;Moist Heat;Traction;Neuromuscular re-education;Therapeutic exercise;Therapeutic activities;Functional mobility training;Gait training;Stair training;Patient/family education;Manual techniques;Dry needling;Passive range of motion;Taping;Spinal Manipulations;Joint Manipulations    PT Next Visit Plan work on sit to stand, addaday to Rt gluteals, flexion based exercise    PT Home Exercise Plan Access Code: [email or web address]    Recommended Other Services initial cert is signed    Consulted and Agree with Plan of Care Patient           Patient will benefit from skilled therapeutic intervention in order to improve the following deficits and impairments:  Abnormal gait, Decreased range of motion, Difficulty walking, Obesity, Increased muscle spasms, Pain, Postural dysfunction, Decreased strength, Decreased mobility, Impaired flexibility, Improper body mechanics, Decreased activity tolerance  Visit Diagnosis: Muscle weakness (generalized)  Acute bilateral low back pain with right-sided sciatica  Abnormal posture  Other abnormalities of gait and mobility     Problem List Patient Active Problem List   Diagnosis Date Noted  . Statin intolerance 01/16/2018  . Bilateral leg edema 12/21/2017  . History of colonic polyps 10/18/2017  . Dyslipidemia 10/18/2017  . Essential hypertension 03/26/2016  . Family history of colon cancer 03/26/2016  . Osteopenia 08/28/2015  . 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 Spinal stenosis of lumbar region 12/05/2013  . Morbid obesity ([place]) 12/19/2012  . Diabetes mellitus with coincident hypertension ([place]) 05/15/2008  . Allergic rhinitis 05/15/2008  . Asthma, mild intermittent 05/15/2008  . Osteoarthritis 05/15/2008  . HEADACHE 05/15/2008   Sigurd Sos, PT 01/28/20  11:43 AM  [facility] [address] [place], [place], [address] Phone: [phone]   Fax:  [phone]  Name: FRANCESS MULLEN MRN: [id number] Date of Birth: 09-26-1950

## 2020-01-31 ENCOUNTER — Ambulatory Visit: Payer: Medicare PPO

## 2020-01-31 ENCOUNTER — Ambulatory Visit: Payer: Medicare PPO | Admitting: Internal Medicine

## 2020-01-31 ENCOUNTER — Other Ambulatory Visit: Payer: Self-pay

## 2020-01-31 DIAGNOSIS — R2689 Other abnormalities of gait and mobility: Secondary | ICD-10-CM

## 2020-01-31 DIAGNOSIS — M5441 Lumbago with sciatica, right side: Secondary | ICD-10-CM | POA: Diagnosis not present

## 2020-01-31 DIAGNOSIS — R293 Abnormal posture: Secondary | ICD-10-CM | POA: Diagnosis not present

## 2020-01-31 DIAGNOSIS — M6281 Muscle weakness (generalized): Secondary | ICD-10-CM | POA: Diagnosis not present

## 2020-01-31 NOTE — Therapy (Signed)
[facility] [address] [place], [place], [address] Phone: [phone]   Fax:  [phone]  Physical Therapy Treatment  Patient Details  Name: Darlene Mclaughlin MRN: [id number] Date of Birth: 10-02-1950 Referring Provider (PT): Isaac Bliss, Rayford Halsted, MD   Encounter Date: 01/31/2020   PT End of Session - 01/31/20 1058    Visit Number 4    Date for PT Re-Evaluation 04/08/20    Authorization Type [employer]    Authorization Time Period submitting for auth 01/15/20 to 04/09/19    Authorization - Visit Number 4    Authorization - Number of Visits 12    Progress Note Due on Visit 10    PT Start Time 1016    PT Stop Time 1058    PT Time Calculation (min) 42 min    Activity Tolerance Patient tolerated treatment well    Behavior During Therapy [facility] for tasks assessed/performed           Past Medical History:  Diagnosis Date  . ALLERGIC RHINITIS 05/15/2008  . Anemia   . ASTHMA 05/15/2008  . Blood transfusion without reported diagnosis 1991   anemia  . Breast cancer ([place]) 2016   right breast  . 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 Diabetes mellitus without complication ([place])   . GERD (gastroesophageal reflux disease)   . Headache(784.0) 05/15/2008  . HYPERTENSION 05/15/2008  . IMPAIRED GLUCOSE TOLERANCE 05/15/2008  . Obesity   . OSTEOARTHRITIS 05/15/2008   back    Past Surgical History:  Procedure Laterality Date  . ABDOMINAL HYSTERECTOMY    . BREAST LUMPECTOMY Right 12/17/2014  . BREAST LUMPECTOMY WITH RADIOACTIVE SEED LOCALIZATION Right 12/17/2014   Procedure: RIGHT BREAST RADIOACTIVE SEED GUIDED PARTIAL MASTECTOMY;  Surgeon: Erroll Luna, MD;  Location: [place];  Service: General;  Laterality: Right;  . CESAREAN SECTION    . KNEE SURGERY     arthroscopic left    There were no vitals filed for this visit.   Subjective Assessment - 01/31/20 1019    Subjective I felt ok after  last session.  I was sore but not like last week.  I'm done with oral steroids now.    Currently in Pain? Yes    Pain Score 5     Pain Location Leg    Pain Orientation Right;Anterior    Pain Descriptors / Indicators Aching;Dull                             [facility] Adult PT Treatment/Exercise - 01/31/20 0001      Ambulation/Gait   Ambulation/Gait Yes    Ambulation/Gait Assistance 6: Modified independent (Device/Increase time)    Assistive device Straight cane    Gait Pattern Step-through pattern    Ambulation Surface Level    Gait Comments gait training with single point cane.  PT lowered the cane 2 notches as it was too high.  Worked on using cane on the Lt vs the Rt. Pt will practice at home.      Exercises   Exercises Lumbar      Lumbar Exercises: Stretches   Active Hamstring Stretch Right;Left;2 reps;20 seconds      Lumbar Exercises: Standing   Row Both;20 reps    Theraband Level (Row) Level 1 (Yellow)    Row Limitations verbal cueing for alignment and core activation    Shoulder Extension Strengthening;Both;20 reps;Theraband  Theraband Level (Shoulder Extension) Level 1 (Yellow)      Lumbar Exercises: Seated   Other Seated Lumbar Exercises press into foam roll 5" hold x10      Knee/Hip Exercises: Aerobic   Nustep Level 2x 10 minutes- Pt present to discuss progress      Knee/Hip Exercises: Seated   Ball Squeeze 5" hold x10 seconds                    PT Short Term Goals - 01/31/20 1021      PT SHORT TERM GOAL #2   Title Pt will be educated on sleeping positions and report improved sleep with less pain at night by at least 20%    Baseline sleeping better    Status Achieved      PT SHORT TERM GOAL #3   Title Pt will demo symmetry in bil WB with sit to stand.    Time 4    Period Weeks    Status On-going             PT Long Term Goals - 01/15/20 1352      PT LONG TERM GOAL #1   Title Pt will be able to stand x 20 min with min  pain so that she may cook meals.    Time 12    Period Weeks    Status New    Target Date 04/08/20      PT LONG TERM GOAL #2   Title Pt will be able to participate in 6 min walk test with improved gait symmetry for increased tolerance of short distance community ambulation for appointments and shopping.    Time 12    Period Weeks    Status New    Target Date 04/08/20      PT LONG TERM GOAL #3   Title Pt with report at least 50% reduction in pain with daily tasks and sleep    Time 12    Period Weeks    Status New    Target Date 04/08/20      PT LONG TERM GOAL #4   Title Pt will improve functional strength of Rt LE and core to at least 4+/5 for improved tolerance of household tasks.    Time 12    Period Weeks    Status New    Target Date 04/08/20      PT LONG TERM GOAL #5   Title Reduce FOTO to </= 58% to demo less limitation.    Baseline 87%    Time 12    Period Weeks    Status New    Target Date 04/08/20                 Plan - 01/31/20 1028    Clinical Impression Statement Pt did better after last session and didn't have a flare-up of pain.  Pain is now 5-6/10 in the Rt LE and pt reports that she is sleeping better with fewer sleep interruptions due to pain.  Standing for meal prep is limited to 2-3 minutes and pt is utilizing a stool to sit on when preparing meals.  Pt tolerated gentle exercise and was monitored closely for pain, technique and fatigue.    Rehab Potential Good    PT Frequency 2x / week    PT Duration 12 weeks    PT Treatment/Interventions ADLs/Self Care Home Management;Aquatic Therapy;Cryotherapy;Electrical Stimulation;Moist Heat;Traction;Neuromuscular re-education;Therapeutic exercise;Therapeutic activities;Functional mobility training;Gait training;Stair training;Patient/family education;Manual techniques;Dry needling;Passive range of  motion;Taping;Spinal Manipulations;Joint Manipulations    PT Next Visit Plan work on sit to stand, addaday to Rt  gluteals, strength in neutral    PT Home Exercise Plan Access Code: [email or web address]    Consulted and Agree with Plan of Care Patient           Patient will benefit from skilled therapeutic intervention in order to improve the following deficits and impairments:  Abnormal gait,Decreased range of motion,Difficulty walking,Obesity,Increased muscle spasms,Pain,Postural dysfunction,Decreased strength,Decreased mobility,Impaired flexibility,Improper body mechanics,Decreased activity tolerance  Visit Diagnosis: Muscle weakness (generalized)  Acute bilateral low back pain with right-sided sciatica  Abnormal posture  Other abnormalities of gait and mobility     Problem List Patient Active Problem List   Diagnosis Date Noted  . Statin intolerance 01/16/2018  . Bilateral leg edema 12/21/2017  . History of colonic polyps 10/18/2017  . Dyslipidemia 10/18/2017  . Essential hypertension 03/26/2016  . Family history of colon cancer 03/26/2016  . Osteopenia 08/28/2015  . 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 Spinal stenosis of lumbar region 12/05/2013  . Morbid obesity ([place]) 12/19/2012  . Diabetes mellitus with coincident hypertension ([place]) 05/15/2008  . Allergic rhinitis 05/15/2008  . Asthma, mild intermittent 05/15/2008  . Osteoarthritis 05/15/2008  . HEADACHE 05/15/2008    Sigurd Sos, PT 01/31/20 10:59 AM  [facility] [address], [place], [place], [address] Phone: [phone]   Fax:  [phone]  Name: Darlene Mclaughlin MRN: [id number] Date of Birth: 07/02/1950

## 2020-02-01 ENCOUNTER — Telehealth: Payer: Self-pay | Admitting: Internal Medicine

## 2020-02-01 NOTE — Telephone Encounter (Signed)
Pt call and stated she need a new RX on  potassium chloride SA (KLOR-CON M20) 20 MEQ tablet sent to  [id number] [address], [address] Phone:  [phone]  Fax:  [phone]

## 2020-02-04 ENCOUNTER — Telehealth: Payer: Self-pay | Admitting: Internal Medicine

## 2020-02-04 ENCOUNTER — Ambulatory Visit: Payer: Medicare PPO

## 2020-02-04 ENCOUNTER — Other Ambulatory Visit: Payer: Self-pay

## 2020-02-04 DIAGNOSIS — R293 Abnormal posture: Secondary | ICD-10-CM | POA: Diagnosis not present

## 2020-02-04 DIAGNOSIS — R2689 Other abnormalities of gait and mobility: Secondary | ICD-10-CM

## 2020-02-04 DIAGNOSIS — M6281 Muscle weakness (generalized): Secondary | ICD-10-CM | POA: Diagnosis not present

## 2020-02-04 DIAGNOSIS — M5441 Lumbago with sciatica, right side: Secondary | ICD-10-CM

## 2020-02-04 DIAGNOSIS — J3089 Other allergic rhinitis: Secondary | ICD-10-CM | POA: Diagnosis not present

## 2020-02-04 DIAGNOSIS — J301 Allergic rhinitis due to pollen: Secondary | ICD-10-CM | POA: Diagnosis not present

## 2020-02-04 MED ORDER — POTASSIUM CHLORIDE CRYS ER 20 MEQ PO TBCR: 20.0000 meq | EXTENDED_RELEASE_TABLET | Freq: Two times a day (BID) | ORAL | 1 refills | Status: DC

## 2020-02-04 NOTE — Telephone Encounter (Signed)
Acute right-sided low back pain with right-sided sciatica  -As anticipated with some acute worsening due to starting of PT, I have advised her to continue to be compliant with physical therapy sessions. -She will get Depo-Medrol injection in office today in addition to a 6-day prednisone taper to start tomorrow. -I do not see any warning signs concerning me enough to do an MRI today, however can consider if she fails to improve or has progressive symptoms over the next 8 to 12 weeks despite conservative management. -She will also do icing and as needed NSAIDs.   Would you like an office visit?

## 2020-02-04 NOTE — Therapy (Signed)
[facility] [address], [place], [place], [address] Phone: [phone]   Fax:  [phone]  Physical Therapy Treatment  Patient Details  Name: Darlene Mclaughlin MRN: [id number] Date of Birth: 1950-08-07 Referring Provider (PT): Isaac Bliss, Rayford Halsted, MD   Encounter Date: 02/04/2020   PT End of Session - 02/04/20 1215    Visit Number 5    Date for PT Re-Evaluation 04/08/20    Authorization Type [employer]    Authorization Time Period submitting for auth 01/15/20 to 04/09/19    Authorization - Visit Number 5    Authorization - Number of Visits 12    Progress Note Due on Visit 10    PT Start Time 3710    PT Stop Time 1226    PT Time Calculation (min) 38 min    Activity Tolerance Patient tolerated treatment well    Behavior During Therapy [facility] for tasks assessed/performed           Past Medical History:  Diagnosis Date  . ALLERGIC RHINITIS 05/15/2008  . Anemia   . ASTHMA 05/15/2008  . Blood transfusion without reported diagnosis 1991   anemia  . Breast cancer ([place]) 2016   right breast  . Cancer ([place])    DCIS right breast  . Diabetes mellitus without complication ([place])   . GERD (gastroesophageal reflux disease)   . Headache(784.0) 05/15/2008  . HYPERTENSION 05/15/2008  . IMPAIRED GLUCOSE TOLERANCE 05/15/2008  . Obesity   . OSTEOARTHRITIS 05/15/2008   back    Past Surgical History:  Procedure Laterality Date  . ABDOMINAL HYSTERECTOMY    . BREAST LUMPECTOMY Right 12/17/2014  . BREAST LUMPECTOMY WITH RADIOACTIVE SEED LOCALIZATION Right 12/17/2014   Procedure: RIGHT BREAST RADIOACTIVE SEED GUIDED PARTIAL MASTECTOMY;  Surgeon: Erroll Luna, MD;  Location: [place];  Service: General;  Laterality: Right;  . CESAREAN SECTION    . KNEE SURGERY     arthroscopic left    There were no vitals filed for this visit.   Subjective Assessment - 02/04/20 1146    Subjective I had a terrible  weekend with the weather.  My Rt leg pain has been bad.    Currently in Pain? Yes    Pain Score 8     Pain Location Leg    Pain Orientation Right    Pain Descriptors / Indicators Aching;Dull    Pain Type Acute pain    Pain Onset More than a month ago    Pain Frequency Constant    Aggravating Factors  standing, walking, household tasks    Pain Relieving Factors gentle mobilitiy                             [facility] Adult PT Treatment/Exercise - 02/04/20 0001      Lumbar Exercises: Standing   Row Both;20 reps    Theraband Level (Row) Level 1 (Yellow)    Shoulder Extension Strengthening;Both;20 reps;Theraband    Theraband Level (Shoulder Extension) Level 1 (Yellow)      Lumbar Exercises: Seated   Other Seated Lumbar Exercises press into foam roll 5" hold x10      Knee/Hip Exercises: Stretches   Active Hamstring Stretch Right;Left;2 reps;20 seconds      Knee/Hip Exercises: Aerobic   Nustep Level 2x 10 minutes- Pt present to discuss progress      Knee/Hip Exercises: Seated   Ball Squeeze 5" hold x10 seconds  PT Short Term Goals - 02/04/20 1155      PT SHORT TERM GOAL #1   Title Pt will demo improved use of TrA with bed mobility and transfers.    Time 2    Period Weeks    Status On-going      PT SHORT TERM GOAL #2   Title Pt will be educated on sleeping positions and report improved sleep with less pain at night by at least 20%    Baseline worse this weekend    Status On-going             PT Long Term Goals - 01/15/20 1352      PT LONG TERM GOAL #1   Title Pt will be able to stand x 20 min with min pain so that she may cook meals.    Time 12    Period Weeks    Status New    Target Date 04/08/20      PT LONG TERM GOAL #2   Title Pt will be able to participate in 6 min walk test with improved gait symmetry for increased tolerance of short distance community ambulation for appointments and shopping.    Time 12     Period Weeks    Status New    Target Date 04/08/20      PT LONG TERM GOAL #3   Title Pt with report at least 50% reduction in pain with daily tasks and sleep    Time 12    Period Weeks    Status New    Target Date 04/08/20      PT LONG TERM GOAL #4   Title Pt will improve functional strength of Rt LE and core to at least 4+/5 for improved tolerance of household tasks.    Time 12    Period Weeks    Status New    Target Date 04/08/20      PT LONG TERM GOAL #5   Title Reduce FOTO to </= 58% to demo less limitation.    Baseline 87%    Time 12    Period Weeks    Status New    Target Date 04/08/20                 Plan - 02/04/20 1157    Clinical Impression Statement Pt has had an increase in pain since last session due to weather. Pt tolerated activity in the clinic well last time without increased pain after.  Pt has reached out to MD to discuss continued high pain levels and limited mobility.   Pain is now 8/10 in the Rt LE and pt reports that she hasn't slept well over the past few nights.  Standing for meal prep is limited to 2-3 minutes and pt is utilizing a stool to sit on when preparing meals.  Pt tolerated gentle exercise and was monitored closely for pain, technique and fatigue.    Rehab Potential Good    PT Frequency 2x / week    PT Duration 12 weeks    PT Treatment/Interventions ADLs/Self Care Home Management;Aquatic Therapy;Cryotherapy;Electrical Stimulation;Moist Heat;Traction;Neuromuscular re-education;Therapeutic exercise;Therapeutic activities;Functional mobility training;Gait training;Stair training;Patient/family education;Manual techniques;Dry needling;Passive range of motion;Taping;Spinal Manipulations;Joint Manipulations    PT Next Visit Plan work on sit to stand, addaday to Rt gluteals, strength in neutral    PT Home Exercise Plan Access Code: [email or web address]    Consulted and Agree with Plan of Care Patient  Patient will benefit from skilled  therapeutic intervention in order to improve the following deficits and impairments:  Abnormal gait,Decreased range of motion,Difficulty walking,Obesity,Increased muscle spasms,Pain,Postural dysfunction,Decreased strength,Decreased mobility,Impaired flexibility,Improper body mechanics,Decreased activity tolerance  Visit Diagnosis: Muscle weakness (generalized)  Acute bilateral low back pain with right-sided sciatica  Abnormal posture  Other abnormalities of gait and mobility     Problem List Patient Active Problem List   Diagnosis Date Noted  . Statin intolerance 01/16/2018  . Bilateral leg edema 12/21/2017  . History of colonic polyps 10/18/2017  . Dyslipidemia 10/18/2017  . Essential hypertension 03/26/2016  . Family history of colon cancer 03/26/2016  . Osteopenia 08/28/2015  . 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 Spinal stenosis of lumbar region 12/05/2013  . Morbid obesity ([place]) 12/19/2012  . Diabetes mellitus with coincident hypertension ([place]) 05/15/2008  . Allergic rhinitis 05/15/2008  . Asthma, mild intermittent 05/15/2008  . Osteoarthritis 05/15/2008  . HEADACHE 05/15/2008   Sigurd Sos, PT 02/04/20 12:17 PM   [facility] [address], [place], [place], [address] Phone: [phone]   Fax:  [phone]  Name: Darlene Mclaughlin MRN: [id number] Date of Birth: 10-Sep-1950

## 2020-02-04 NOTE — Telephone Encounter (Signed)
Pt call and stated the pain is back and the medications is not working and need a call back.

## 2020-02-05 ENCOUNTER — Other Ambulatory Visit: Payer: Self-pay | Admitting: Internal Medicine

## 2020-02-05 DIAGNOSIS — M5441 Lumbago with sciatica, right side: Secondary | ICD-10-CM

## 2020-02-05 MED ORDER — PREDNISONE 10 MG (21) PO TBPK: ORAL_TABLET | ORAL | 0 refills | Status: DC

## 2020-02-05 NOTE — Telephone Encounter (Signed)
Patient is aware 

## 2020-02-05 NOTE — Telephone Encounter (Signed)
Will send another prednisone taper, continue PT. If not better, then schedule for MRI L-spine.

## 2020-02-07 ENCOUNTER — Other Ambulatory Visit: Payer: Self-pay

## 2020-02-07 ENCOUNTER — Ambulatory Visit: Payer: Medicare PPO

## 2020-02-07 DIAGNOSIS — R293 Abnormal posture: Secondary | ICD-10-CM | POA: Diagnosis not present

## 2020-02-07 DIAGNOSIS — M5441 Lumbago with sciatica, right side: Secondary | ICD-10-CM | POA: Diagnosis not present

## 2020-02-07 DIAGNOSIS — M6281 Muscle weakness (generalized): Secondary | ICD-10-CM

## 2020-02-07 DIAGNOSIS — R2689 Other abnormalities of gait and mobility: Secondary | ICD-10-CM

## 2020-02-07 NOTE — Patient Instructions (Signed)
Access Code: [email or web address] URL: [email or web address] Date: 02/07/2020 Prepared by: Claiborne Billings  Exercises  Standing Shoulder Flexion to 90 Degrees - 1 x daily - 3 x weekly - 10 reps - 1 sets - 3 sec hold Standing Shoulder Scaption - 1 x daily - 3 x weekly - 10 reps - 1 sets - 3 sec hold Shoulder Abduction - Thumbs Up - 1 x daily - 3 x weekly - 10 reps - 1 sets - 3 sec hold

## 2020-02-07 NOTE — Therapy (Signed)
[facility] [address], [place], [place], [address] Phone: [phone]   Fax:  [phone]  Physical Therapy Treatment  Patient Details  Name: Darlene Mclaughlin MRN: [id number] Date of Birth: 01/18/1951 Referring Provider (PT): Isaac Bliss, Rayford Halsted, MD   Encounter Date: 02/07/2020   PT End of Session - 02/07/20 1144    Visit Number 6    Date for PT Re-Evaluation 04/08/20    Authorization Type [employer]    Authorization Time Period submitting for auth 01/15/20 to 04/09/19    Authorization - Visit Number 6    Authorization - Number of Visits 12    Progress Note Due on Visit 10    PT Start Time 1101    PT Stop Time 1145    PT Time Calculation (min) 44 min    Activity Tolerance Patient tolerated treatment well    Behavior During Therapy [facility] for tasks assessed/performed           Past Medical History:  Diagnosis Date  . ALLERGIC RHINITIS 05/15/2008  . Anemia   . ASTHMA 05/15/2008  . Blood transfusion without reported diagnosis 1991   anemia  . Breast cancer ([place]) 2016   right breast  . Cancer ([place])    DCIS right breast  . Diabetes mellitus without complication ([place])   . GERD (gastroesophageal reflux disease)   . Headache(784.0) 05/15/2008  . HYPERTENSION 05/15/2008  . IMPAIRED GLUCOSE TOLERANCE 05/15/2008  . Obesity   . OSTEOARTHRITIS 05/15/2008   back    Past Surgical History:  Procedure Laterality Date  . ABDOMINAL HYSTERECTOMY    . BREAST LUMPECTOMY Right 12/17/2014  . BREAST LUMPECTOMY WITH RADIOACTIVE SEED LOCALIZATION Right 12/17/2014   Procedure: RIGHT BREAST RADIOACTIVE SEED GUIDED PARTIAL MASTECTOMY;  Surgeon: Erroll Luna, MD;  Location: [place];  Service: General;  Laterality: Right;  . CESAREAN SECTION    . KNEE SURGERY     arthroscopic left    There were no vitals filed for this visit.   Subjective Assessment - 02/07/20 1103    Subjective I am feeling  better overall because Dr Jerilee Hoh prescribed another round of oral steroids.    Diagnostic tests xray pelvis and femur, LE doppler and ankle-brachial index: mild degnerative changes in lumbar spine and Rt medial knee and patellofemoral compartment    Currently in Pain? No/denies                             [facility] Adult PT Treatment/Exercise - 02/07/20 0001      Lumbar Exercises: Standing   Row Both;20 reps    Theraband Level (Row) Level 2 (Red)    Shoulder Extension Strengthening;Both;20 reps;Theraband    Theraband Level (Shoulder Extension) Level 2 (Red)      Lumbar Exercises: Seated   Other Seated Lumbar Exercises 3 way raises: 1# 2x10      Knee/Hip Exercises: Stretches   Active Hamstring Stretch Right;Left;2 reps;20 seconds      Knee/Hip Exercises: Aerobic   Nustep Level 2x 10 minutes- Pt present to discuss progress      Knee/Hip Exercises: Seated   Ball Squeeze 5" hold x10 seconds    Sit to [employer] 2 sets;5 reps                  PT Education - 02/07/20 1128    Education Details Access Code: [email or web address]    Person(s) Educated Patient  Methods Explanation;Demonstration;Handout    Comprehension Verbalized understanding;Returned demonstration            PT Short Term Goals - 02/07/20 1104      PT SHORT TERM GOAL #1   Title Pt will demo improved use of TrA with bed mobility and transfers.    Status Achieved             PT Long Term Goals - 01/15/20 1352      PT LONG TERM GOAL #1   Title Pt will be able to stand x 20 min with min pain so that she may cook meals.    Time 12    Period Weeks    Status New    Target Date 04/08/20      PT LONG TERM GOAL #2   Title Pt will be able to participate in 6 min walk test with improved gait symmetry for increased tolerance of short distance community ambulation for appointments and shopping.    Time 12    Period Weeks    Status New    Target Date 04/08/20      PT LONG TERM GOAL #3   Title Pt  with report at least 50% reduction in pain with daily tasks and sleep    Time 12    Period Weeks    Status New    Target Date 04/08/20      PT LONG TERM GOAL #4   Title Pt will improve functional strength of Rt LE and core to at least 4+/5 for improved tolerance of household tasks.    Time 12    Period Weeks    Status New    Target Date 04/08/20      PT LONG TERM GOAL #5   Title Reduce FOTO to </= 58% to demo less limitation.    Baseline 87%    Time 12    Period Weeks    Status New    Target Date 04/08/20                 Plan - 02/07/20 1146    Clinical Impression Statement Pt arrived without pain today due to starting an oral steroid.  Pt has been sleeping better and able to move with less pain overall.  Pt was able to participate in exercise in the clinic today without increased pain and with minimal rest breaks required.  PT provided verbal and tactile cues for technique with all exercise today.  Pt will continue to benefit from skilled PT to address LE strength, core activation and to improve overall mobility as pain allows.    PT Frequency 2x / week    PT Duration 12 weeks    PT Treatment/Interventions ADLs/Self Care Home Management;Aquatic Therapy;Cryotherapy;Electrical Stimulation;Moist Heat;Traction;Neuromuscular re-education;Therapeutic exercise;Therapeutic activities;Functional mobility training;Gait training;Stair training;Patient/family education;Manual techniques;Dry needling;Passive range of motion;Taping;Spinal Manipulations;Joint Manipulations    PT Next Visit Plan work on sit to stand, addaday to Rt gluteals, strength in neutral, advance activity    PT Home Exercise Plan Access Code: [email or web address]    Consulted and Agree with Plan of Care Patient           Patient will benefit from skilled therapeutic intervention in order to improve the following deficits and impairments:  Abnormal gait,Decreased range of motion,Difficulty walking,Obesity,Increased muscle  spasms,Pain,Postural dysfunction,Decreased strength,Decreased mobility,Impaired flexibility,Improper body mechanics,Decreased activity tolerance  Visit Diagnosis: Muscle weakness (generalized)  Acute bilateral low back pain with right-sided sciatica  Abnormal posture  Other abnormalities of gait and  mobility     Problem List Patient Active Problem List   Diagnosis Date Noted  . Statin intolerance 01/16/2018  . Bilateral leg edema 12/21/2017  . History of colonic polyps 10/18/2017  . Dyslipidemia 10/18/2017  . Essential hypertension 03/26/2016  . Family history of colon cancer 03/26/2016  . Osteopenia 08/28/2015  . 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 Spinal stenosis of lumbar region 12/05/2013  . Morbid obesity ([place]) 12/19/2012  . Diabetes mellitus with coincident hypertension ([place]) 05/15/2008  . Allergic rhinitis 05/15/2008  . Asthma, mild intermittent 05/15/2008  . Osteoarthritis 05/15/2008  . HEADACHE 05/15/2008    Sigurd Sos, PT 02/07/20 11:47 AM  [facility] [address], [place], [place], [address] Phone: [phone]   Fax:  [phone]  Name: WILHELMINA HARK MRN: [id number] Date of Birth: 02-Sep-1950

## 2020-02-12 ENCOUNTER — Ambulatory Visit: Payer: Medicare PPO

## 2020-02-12 ENCOUNTER — Telehealth: Payer: Self-pay | Admitting: Internal Medicine

## 2020-02-12 ENCOUNTER — Other Ambulatory Visit: Payer: Self-pay

## 2020-02-12 DIAGNOSIS — R293 Abnormal posture: Secondary | ICD-10-CM | POA: Diagnosis not present

## 2020-02-12 DIAGNOSIS — M6281 Muscle weakness (generalized): Secondary | ICD-10-CM | POA: Diagnosis not present

## 2020-02-12 DIAGNOSIS — M5441 Lumbago with sciatica, right side: Secondary | ICD-10-CM | POA: Diagnosis not present

## 2020-02-12 DIAGNOSIS — R2689 Other abnormalities of gait and mobility: Secondary | ICD-10-CM

## 2020-02-12 NOTE — Therapy (Signed)
[facility] [address] [place], [place], [address] Phone: [phone]   Fax:  [phone]  Physical Therapy Treatment  Patient Details  Name: Darlene Mclaughlin MRN: [id number] Date of Birth: Feb 25, 1950 Referring Provider (PT): Isaac Bliss, Rayford Halsted, MD   Encounter Date: 02/12/2020   PT End of Session - 02/12/20 1222    Visit Number 7    Date for PT Re-Evaluation 04/08/20    Authorization Type [employer]    Authorization - Visit Number 7    Authorization - Number of Visits 12    Progress Note Due on Visit 10    PT Start Time 1143    PT Stop Time 1224    PT Time Calculation (min) 41 min    Activity Tolerance Patient tolerated treatment well    Behavior During Therapy [facility] for tasks assessed/performed           Past Medical History:  Diagnosis Date  . ALLERGIC RHINITIS 05/15/2008  . Anemia   . ASTHMA 05/15/2008  . Blood transfusion without reported diagnosis 1991   anemia  . Breast cancer ([place]) 2016   right breast  . 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 Diabetes mellitus without complication ([place])   . GERD (gastroesophageal reflux disease)   . Headache(784.0) 05/15/2008  . HYPERTENSION 05/15/2008  . IMPAIRED GLUCOSE TOLERANCE 05/15/2008  . Obesity   . OSTEOARTHRITIS 05/15/2008   back    Past Surgical History:  Procedure Laterality Date  . ABDOMINAL HYSTERECTOMY    . BREAST LUMPECTOMY Right 12/17/2014  . BREAST LUMPECTOMY WITH RADIOACTIVE SEED LOCALIZATION Right 12/17/2014   Procedure: RIGHT BREAST RADIOACTIVE SEED GUIDED PARTIAL MASTECTOMY;  Surgeon: Erroll Luna, MD;  Location: [place];  Service: General;  Laterality: Right;  . CESAREAN SECTION    . KNEE SURGERY     arthroscopic left    There were no vitals filed for this visit.   Subjective Assessment - 02/12/20 1148    Subjective My pain is getting worse again now that I am tapering the medication.  I am going to  call the MD today to discuss.    Currently in Pain? Yes    Pain Score 8     Pain Location Leg    Pain Orientation Right    Pain Descriptors / Indicators Aching;Dull    Pain Onset More than a month ago    Pain Frequency Constant    Aggravating Factors  standing, walking, sit to stand    Pain Relieving Factors nothing really helps right now                             [facility] Adult PT Treatment/Exercise - 02/12/20 0001      Lumbar Exercises: Standing   Row Both;20 reps    Theraband Level (Row) Level 2 (Red)    Shoulder Extension Strengthening;Both;20 reps;Theraband    Theraband Level (Shoulder Extension) Level 2 (Red)      Lumbar Exercises: Seated   Long Arc Quad on Chair Strengthening;Both;2 sets;10 reps    Other Seated Lumbar Exercises 3 way raises: 1# 2x10      Knee/Hip Exercises: Stretches   Active Hamstring Stretch Right;Left;2 reps;20 seconds      Knee/Hip Exercises: Aerobic   Nustep Level 2x 10 minutes- Pt present to discuss progress      Knee/Hip Exercises: Seated   Ball Squeeze 5" hold x10  seconds    Sit to [employer] 2 sets;5 reps                    PT Short Term Goals - 02/12/20 1217      PT SHORT TERM GOAL #1   Title Pt will demo improved use of TrA with bed mobility and transfers.    Status Achieved      PT SHORT TERM GOAL #2   Title Pt will be educated on sleeping positions and report improved sleep with less pain at night by at least 20%    Baseline was improved wehn taking steroid, now not sleeping well again      PT SHORT TERM GOAL #3   Title Pt will demo symmetry in bil WB with sit to stand.    Baseline was symmetrical and now Lt>Rt    Time 4    Period Weeks    Status On-going             PT Long Term Goals - 01/15/20 1352      PT LONG TERM GOAL #1   Title Pt will be able to stand x 20 min with min pain so that she may cook meals.    Time 12    Period Weeks    Status New    Target Date 04/08/20      PT LONG TERM  GOAL #2   Title Pt will be able to participate in 6 min walk test with improved gait symmetry for increased tolerance of short distance community ambulation for appointments and shopping.    Time 12    Period Weeks    Status New    Target Date 04/08/20      PT LONG TERM GOAL #3   Title Pt with report at least 50% reduction in pain with daily tasks and sleep    Time 12    Period Weeks    Status New    Target Date 04/08/20      PT LONG TERM GOAL #4   Title Pt will improve functional strength of Rt LE and core to at least 4+/5 for improved tolerance of household tasks.    Time 12    Period Weeks    Status New    Target Date 04/08/20      PT LONG TERM GOAL #5   Title Reduce FOTO to </= 58% to demo less limitation.    Baseline 87%    Time 12    Period Weeks    Status New    Target Date 04/08/20                 Plan - 02/12/20 1212    Clinical Impression Statement Pt arrived with 8/10 Rt LE pain again today now that she is no longer taking the oral steroid.  Pt will contact MD today to discuss.   PT provided verbal and tactile cues for technique with all exercise today and was monitored closely for pain. Pt had increased pain with standing for rowing/shoulder extension.  Pt will continue to benefit from skilled PT to address LE strength, core activation and to improve overall mobility as pain allows.    PT Frequency 2x / week    PT Duration 12 weeks    PT Treatment/Interventions ADLs/Self Care Home Management;Aquatic Therapy;Cryotherapy;Electrical Stimulation;Moist Heat;Traction;Neuromuscular re-education;Therapeutic exercise;Therapeutic activities;Functional mobility training;Gait training;Stair training;Patient/family education;Manual techniques;Dry needling;Passive range of motion;Taping;Spinal Manipulations;Joint Manipulations    PT Next Visit Plan see what  MD says, mobility as able    PT Home Exercise Plan Access Code: [email or web address]    Consulted and Agree with Plan of Care  Patient           Patient will benefit from skilled therapeutic intervention in order to improve the following deficits and impairments:  Abnormal gait,Decreased range of motion,Difficulty walking,Obesity,Increased muscle spasms,Pain,Postural dysfunction,Decreased strength,Decreased mobility,Impaired flexibility,Improper body mechanics,Decreased activity tolerance  Visit Diagnosis: Muscle weakness (generalized)  Acute bilateral low back pain with right-sided sciatica  Abnormal posture  Other abnormalities of gait and mobility     Problem List Patient Active Problem List   Diagnosis Date Noted  . Statin intolerance 01/16/2018  . Bilateral leg edema 12/21/2017  . History of colonic polyps 10/18/2017  . Dyslipidemia 10/18/2017  . Essential hypertension 03/26/2016  . Family history of colon cancer 03/26/2016  . Osteopenia 08/28/2015  . 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 Spinal stenosis of lumbar region 12/05/2013  . Morbid obesity ([place]) 12/19/2012  . Diabetes mellitus with coincident hypertension ([place]) 05/15/2008  . Allergic rhinitis 05/15/2008  . Asthma, mild intermittent 05/15/2008  . Osteoarthritis 05/15/2008  . HEADACHE 05/15/2008     Sigurd Sos, PT 02/12/20 12:26 PM  [facility] [address], [place], [place], [address] Phone: [phone]   Fax:  [phone]  Name: Darlene Mclaughlin MRN: [id number] Date of Birth: 24-Oct-1950

## 2020-02-12 NOTE — Telephone Encounter (Signed)
Patient ran out of Prednisone yesterday and wants another because she is still hurting.  She is requesting a call back.   Pharmacy- [facility].

## 2020-02-13 ENCOUNTER — Other Ambulatory Visit: Payer: Self-pay | Admitting: Internal Medicine

## 2020-02-13 DIAGNOSIS — M5441 Lumbago with sciatica, right side: Secondary | ICD-10-CM

## 2020-02-15 ENCOUNTER — Ambulatory Visit (HOSPITAL_COMMUNITY)
Admission: EM | Admit: 2020-02-15 | Discharge: 2020-02-15 | Disposition: A | Discharge status: Home / Self Care | Payer: Medicare PPO | Attending: Urgent Care | Admitting: Urgent Care

## 2020-02-15 ENCOUNTER — Encounter (HOSPITAL_COMMUNITY): Payer: Self-pay | Admitting: Emergency Medicine

## 2020-02-15 ENCOUNTER — Other Ambulatory Visit: Payer: Self-pay

## 2020-02-15 DIAGNOSIS — M48061 Spinal stenosis, lumbar region without neurogenic claudication: Secondary | ICD-10-CM

## 2020-02-15 DIAGNOSIS — G8929 Other chronic pain: Secondary | ICD-10-CM

## 2020-02-15 DIAGNOSIS — M545 Low back pain, unspecified: Secondary | ICD-10-CM

## 2020-02-15 LAB — CBG MONITORING, ED: Glucose-Capillary: 165 mg/dL — ABNORMAL HIGH (ref 70–99)

## 2020-02-15 MED ORDER — PREDNISONE 20 MG PO TABS: ORAL_TABLET | ORAL | 0 refills | Status: DC

## 2020-02-15 NOTE — Discharge Instructions (Signed)
Please check your blood sugar. If your sugar starts to go above 250/300 then cut your dose in half. Limit your carbohydrates which includes foods like pasta, breads, sweet breads, pastry, rice, potatoes, desserts. These foods can elevate your blood sugar. Also, limit and avoid drinks that contain a lot of sugar such as sodas, sweet teas, fruit juices.  Drinking plain water will be much more helpful, try 64 ounces of water daily.  It is okay to flavor your water naturally by cutting cucumber, lemon, mint or lime, placing it in a picture with water and drinking it over a period of 24-48 hours as long as it remains refrigerated. Follow up with your PCP asap.

## 2020-02-15 NOTE — ED Triage Notes (Signed)
Pt c/o back pain that radiates down her leg since September. Pt states she had a cortisone injection at the beginning of December and had some prednisone and felt better. Pt states her pcp told her she would order an mri. Pt states that she needs a refill on her prednisone. Pt states she is sore and stiff on the left side from laying down.

## 2020-02-15 NOTE — ED Provider Notes (Signed)
[name]   MRN: [id number] DOB: Oct 09, 1950  Subjective:   Darlene Mclaughlin is a 69 y.o. female presenting for recurrent chronic low back pain. She had a cortisone injection, steroid taper for her severe back pain at the beginning of December. Has a history of spinal stenosis of lumbar region. Has previously been managed by neurosurgery and had good response to steroid injections but after having a bad outcome on the third injection decided not to go back.  No falls, trauma, weakness.  No current facility-administered medications for this encounter.  Current Outpatient Medications:  .  Accu-Chek FastClix Lancets MISC, 1 EACH BY DOES NOT APPLY ROUTE DAILY. USE FOR ACCU-CHEK GUIDE, Disp: 102 each, Rfl: 3 .  ACCU-CHEK GUIDE test strip, 1 EACH BY OTHER ROUTE DAILY., Disp: 100 strip, Rfl: 3 .  acetaminophen (TYLENOL) 650 MG CR tablet, Take 650 mg by mouth every 8 (eight) hours as needed.  , Disp: , Rfl:  .  aspirin 81 MG tablet, Take 81 mg by mouth daily.  , Disp: , Rfl:  .  blood glucose meter kit and supplies KIT, Dispense based on patient and insurance preference. Use up to twice daily as directed., Disp: 1 each, Rfl: 0 .  Blood Glucose Monitoring Suppl (ACCU-CHEK GUIDE ME) w/Device KIT, 1 each by Other route daily., Disp: 1 kit, Rfl: 0 .  CALCIUM PO, Take 1,000 mg by mouth daily., Disp: , Rfl:  .  chlorthalidone (HYGROTON) 25 MG tablet, TAKE 1 TABLET BY MOUTH EVERY DAY, Disp: 90 tablet, Rfl: 1 .  EPIPEN 2-PAK 0.3 MG/0.3ML SOAJ injection, 0.3 mg. Reported on 05/01/2015, Disp: , Rfl: 1 .  fluticasone (FLONASE) 50 MCG/ACT nasal spray, Place 1 spray into both nostrils daily., Disp: 16 g, Rfl: 5 .  Melatonin 10 MG TABS, Take 1 tablet by mouth at bedtime., Disp: , Rfl:  .  metFORMIN (GLUCOPHAGE-XR) 500 MG 24 hr tablet, TAKE 2 TABLETS (1,000 MG TOTAL) BY MOUTH DAILY WITH SUPPER., Disp: 180 tablet, Rfl: 1 .  Multiple Vitamin (MULTIVITAMIN) tablet, Take 1 tablet by mouth daily.  ,  Disp: , Rfl:  .  NON FORMULARY, EVERY OTHER WEEK ALLERGY INJECTIONS, Disp: , Rfl:  .  potassium chloride SA (KLOR-CON M20) 20 MEQ tablet, Take 1 tablet (20 mEq total) by mouth 2 (two) times daily., Disp: 180 tablet, Rfl: 1 .  predniSONE (STERAPRED UNI-PAK 21 TAB) 10 MG (21) TBPK tablet, Take as directed, Disp: 21 tablet, Rfl: 0 .  PROAIR HFA 108 (90 BASE) MCG/ACT inhaler, Inhale 1 puff into the lungs every 6 (six) hours as needed. , Disp: , Rfl: 0 .  traMADol (ULTRAM) 50 MG tablet, TAKE 1 TABLET (50 MG TOTAL) BY MOUTH 2 (TWO) TIMES DAILY AS NEEDED FOR SEVERE PAIN., Disp: 60 tablet, Rfl: 2   Allergies  Allergen Reactions  . Atorvastatin   . Hydrochlorothiazide   . Macrobid [Nitrofurantoin Monohyd Macro]      Liver problems  . Naproxen   . Simvastatin     Hallucinations, muscle pains  . Zanaflex [Tizanidine Hcl] Other (See Comments)     Liver problems    Past Medical History:  Diagnosis Date  . ALLERGIC RHINITIS 05/15/2008  . Anemia   . ASTHMA 05/15/2008  . Blood transfusion without reported diagnosis 1991   anemia  . Breast cancer ([place]) 2016   right breast  . Cancer ([place])    DCIS right breast  . Diabetes mellitus without complication ([place])   .  GERD (gastroesophageal reflux disease)   . Headache(784.0) 05/15/2008  . HYPERTENSION 05/15/2008  . IMPAIRED GLUCOSE TOLERANCE 05/15/2008  . Obesity   . OSTEOARTHRITIS 05/15/2008   back     Past Surgical History:  Procedure Laterality Date  . ABDOMINAL HYSTERECTOMY    . BREAST LUMPECTOMY Right 12/17/2014  . BREAST LUMPECTOMY WITH RADIOACTIVE SEED LOCALIZATION Right 12/17/2014   Procedure: RIGHT BREAST RADIOACTIVE SEED GUIDED PARTIAL MASTECTOMY;  Surgeon: Erroll Luna, MD;  Location: [place];  Service: General;  Laterality: Right;  . CESAREAN SECTION    . KNEE SURGERY     arthroscopic left    Family History  Problem Relation Age of Onset  . Diabetes Mother   . Hypertension Mother   . Breast cancer Mother 54        Breast cancer twice   . Hyperlipidemia Father   . Colon cancer Brother        4 th brother  . Diabetes Brother   . Pancreatic cancer Brother        middle brother  . Stomach cancer Neg Hx     Social History   Tobacco Use  . Smoking status: Never Smoker  . Smokeless tobacco: Never Used  Vaping Use  . Vaping Use: Never used  Substance Use Topics  . Alcohol use: Yes    Comment: RARELY  . Drug use: No    ROS   Objective:   Vitals: BP (!) 143/71 (BP Location: Right Arm)   Pulse 98   Temp 99.2 F (37.3 C) (Oral)   Resp 18   SpO2 98%   Physical Exam Constitutional:      General: She is not in acute distress.    Appearance: Normal appearance. She is well-developed. She is not ill-appearing, toxic-appearing or diaphoretic.  HENT:     Head: Normocephalic and atraumatic.     Nose: Nose normal.     Mouth/Throat:     Mouth: Mucous membranes are moist.     Pharynx: Oropharynx is clear.  Eyes:     General: No scleral icterus.       Right eye: No discharge.        Left eye: No discharge.     Extraocular Movements: Extraocular movements intact.     Conjunctiva/sclera: Conjunctivae normal.     Pupils: Pupils are equal, round, and reactive to light.  Cardiovascular:     Rate and Rhythm: Normal rate.  Pulmonary:     Effort: Pulmonary effort is normal.  Musculoskeletal:     Lumbar back: Spasms and tenderness (bilateral paraspinal muscles) present. No swelling, edema, deformity, signs of trauma, lacerations or bony tenderness. Decreased range of motion. Positive right straight leg raise test. Negative left straight leg raise test. No scoliosis.  Skin:    General: Skin is warm and dry.  Neurological:     General: No focal deficit present.     Mental Status: She is alert and oriented to person, place, and time.     Motor: No weakness.     Coordination: Coordination normal.     Gait: Gait normal.     Deep Tendon Reflexes: Reflexes normal.  Psychiatric:        Mood and  Affect: Mood normal.        Behavior: Behavior normal.        Thought Content: Thought content normal.        Judgment: Judgment normal.     Results for orders placed or performed during  the hospital encounter of 02/15/20 (from the past 24 hour(s))  POC CBG monitoring     Status: Abnormal   Collection Time: 02/15/20  4:22 PM  Result Value Ref Range   Glucose-Capillary 165 (H) 70 - 99 mg/dL    Assessment and Plan :   PDMP not reviewed this encounter.  1. Spinal stenosis of lumbar region, unspecified whether neurogenic claudication present   2. Chronic low back pain, unspecified back pain laterality, unspecified whether sciatica present     Patient recently underwent a steroid taper with some relief but symptoms return.  She is not a good candidate for opioid pain medication, NSAID.  Tylenol has not helped the patient.  Ultimately, she needs to follow-up with neurosurgery, repeat an MRI.  She will attempt this through her primary care provider.  Emphasized strict diabetic friendly diet especially for her second round of steroids. Counseled patient on potential for adverse effects with medications prescribed/recommended today, ER and return-to-clinic precautions discussed, patient verbalized understanding.    Jaynee Eagles, PA-C 02/17/20 1010

## 2020-02-17 ENCOUNTER — Encounter (HOSPITAL_COMMUNITY): Payer: Self-pay | Admitting: Urgent Care

## 2020-02-20 ENCOUNTER — Other Ambulatory Visit: Payer: Self-pay

## 2020-02-21 ENCOUNTER — Ambulatory Visit: Payer: Medicare PPO | Admitting: Internal Medicine

## 2020-02-21 ENCOUNTER — Ambulatory Visit: Payer: Medicare PPO

## 2020-02-21 ENCOUNTER — Encounter: Payer: Self-pay | Admitting: Internal Medicine

## 2020-02-21 VITALS — BP 130/70 | HR 95 | Temp 98.6°F | Ht 63.5 in | Wt 233.2 lb

## 2020-02-21 DIAGNOSIS — M6281 Muscle weakness (generalized): Secondary | ICD-10-CM

## 2020-02-21 DIAGNOSIS — M5441 Lumbago with sciatica, right side: Secondary | ICD-10-CM

## 2020-02-21 DIAGNOSIS — R293 Abnormal posture: Secondary | ICD-10-CM

## 2020-02-21 DIAGNOSIS — R2689 Other abnormalities of gait and mobility: Secondary | ICD-10-CM

## 2020-02-21 DIAGNOSIS — J3089 Other allergic rhinitis: Secondary | ICD-10-CM | POA: Diagnosis not present

## 2020-02-21 DIAGNOSIS — J301 Allergic rhinitis due to pollen: Secondary | ICD-10-CM | POA: Diagnosis not present

## 2020-02-21 MED ORDER — METHYLPREDNISOLONE ACETATE 40 MG/ML IJ SUSP
40.0000 mg | Freq: Once | INTRAMUSCULAR | Status: AC
  Administered 2020-02-21: 40 mg via INTRAMUSCULAR

## 2020-02-21 NOTE — Progress Notes (Signed)
Established Patient Office Visit     This visit occurred during the SARS-CoV-2 public health emergency.  Safety protocols were in place, including screening questions prior to the visit, additional usage of staff PPE, and extensive cleaning of exam room while observing appropriate contact time as indicated for disinfecting solutions.    CC/Reason for Visit: Continued back and right leg pain  HPI: Darlene Mclaughlin is a 69 y.o. female who is coming in today for the above mentioned reasons.  She continues to have severe back and right leg pain.  Christmas Eve she had to visit the urgent care where another prednisone taper was given.  Beginning of December I had given her a Medrol injection as well as a prednisone taper.  She has continued with physical therapy.  She is miserable with pain.  She denies any bowel or bladder incontinence, no fever, no saddle anesthesia, no weakness of her lower extremities.  I have reviewed her lumbar spine MRI from 2016 with report as below:  IMPRESSION: 1. Progressive facet hypertrophy across multiple levels in the lumbar spine. 2. Slight disc bulging and mild foraminal narrowing bilaterally at L1-2. 3. Progressive moderate to severe central canal stenosis and moderate foraminal narrowing at L2-3. 4. Moderate subarticular and mild to moderate foraminal stenosis at L3-4. 5. Moderate subarticular and bilateral foraminal stenosis at L4-5. Conal disease is worse on the left. 6. Mild subarticular and foraminal narrowing bilaterally at L5-S1.  At that time she received several epidural injections which provided relief but it appears she had "a bad side effect" after the third one and decided to discontinue them.   Past Medical/Surgical History: Past Medical History:  Diagnosis Date  . ALLERGIC RHINITIS 05/15/2008  . Anemia   . ASTHMA 05/15/2008  . Blood transfusion without reported diagnosis 1991   anemia  . Breast cancer ([place]) 2016   right breast   . Cancer ([place])    DCIS right breast  . Diabetes mellitus without complication ([place])   . GERD (gastroesophageal reflux disease)   . Headache(784.0) 05/15/2008  . HYPERTENSION 05/15/2008  . IMPAIRED GLUCOSE TOLERANCE 05/15/2008  . Obesity   . OSTEOARTHRITIS 05/15/2008   back    Past Surgical History:  Procedure Laterality Date  . ABDOMINAL HYSTERECTOMY    . BREAST LUMPECTOMY Right 12/17/2014  . BREAST LUMPECTOMY WITH RADIOACTIVE SEED LOCALIZATION Right 12/17/2014   Procedure: RIGHT BREAST RADIOACTIVE SEED GUIDED PARTIAL MASTECTOMY;  Surgeon: Erroll Luna, MD;  Location: [facility];  Service: General;  Laterality: Right;  . CESAREAN SECTION    . KNEE SURGERY     arthroscopic left    Social History:  reports that she has never smoked. She has never used smokeless tobacco. She reports current alcohol use. She reports that she does not use drugs.  Allergies: Allergies  Allergen Reactions  . Atorvastatin   . Hydrochlorothiazide   . Macrobid [Nitrofurantoin Monohyd Macro]      Liver problems  . Naproxen   . Simvastatin     Hallucinations, muscle pains  . Zanaflex [Tizanidine Hcl] Other (See Comments)     Liver problems    Family History:  Family History  Problem Relation Age of Onset  . Diabetes Mother   . Hypertension Mother   . Breast cancer Mother 17       Breast cancer twice   . Hyperlipidemia Father   . Colon cancer Brother        4 th brother  . Diabetes  Brother   . Pancreatic cancer Brother        middle brother  . Stomach cancer Neg Hx      Current Outpatient Medications:  .  Accu-Chek FastClix Lancets MISC, 1 EACH BY DOES NOT APPLY ROUTE DAILY. USE FOR ACCU-CHEK GUIDE, Disp: 102 each, Rfl: 3 .  ACCU-CHEK GUIDE test strip, 1 EACH BY OTHER ROUTE DAILY., Disp: 100 strip, Rfl: 3 .  acetaminophen (TYLENOL) 650 MG CR tablet, Take 650 mg by mouth every 8 (eight) hours as needed., Disp: , Rfl:  .  aspirin 81 MG tablet, Take 81 mg by mouth daily.,  Disp: , Rfl:  .  blood glucose meter kit and supplies KIT, Dispense based on patient and insurance preference. Use up to twice daily as directed., Disp: 1 each, Rfl: 0 .  Blood Glucose Monitoring Suppl (ACCU-CHEK GUIDE ME) w/Device KIT, 1 each by Other route daily., Disp: 1 kit, Rfl: 0 .  CALCIUM PO, Take 1,000 mg by mouth daily., Disp: , Rfl:  .  chlorthalidone (HYGROTON) 25 MG tablet, TAKE 1 TABLET BY MOUTH EVERY DAY, Disp: 90 tablet, Rfl: 1 .  EPIPEN 2-PAK 0.3 MG/0.3ML SOAJ injection, 0.3 mg. Reported on 05/01/2015, Disp: , Rfl: 1 .  fluticasone (FLONASE) 50 MCG/ACT nasal spray, Place 1 spray into both nostrils daily., Disp: 16 g, Rfl: 5 .  Melatonin 10 MG TABS, Take 1 tablet by mouth at bedtime., Disp: , Rfl:  .  metFORMIN (GLUCOPHAGE-XR) 500 MG 24 hr tablet, TAKE 2 TABLETS (1,000 MG TOTAL) BY MOUTH DAILY WITH SUPPER., Disp: 180 tablet, Rfl: 1 .  Multiple Vitamin (MULTIVITAMIN) tablet, Take 1 tablet by mouth daily., Disp: , Rfl:  .  NON FORMULARY, EVERY OTHER WEEK ALLERGY INJECTIONS, Disp: , Rfl:  .  potassium chloride SA (KLOR-CON M20) 20 MEQ tablet, Take 1 tablet (20 mEq total) by mouth 2 (two) times daily., Disp: 180 tablet, Rfl: 1 .  predniSONE (DELTASONE) 20 MG tablet, Take 2 tablets daily with breakfast., Disp: 10 tablet, Rfl: 0 .  PROAIR HFA 108 (90 BASE) MCG/ACT inhaler, Inhale 1 puff into the lungs every 6 (six) hours as needed. , Disp: , Rfl: 0 .  traMADol (ULTRAM) 50 MG tablet, TAKE 1 TABLET (50 MG TOTAL) BY MOUTH 2 (TWO) TIMES DAILY AS NEEDED FOR SEVERE PAIN., Disp: 60 tablet, Rfl: 2  Current Facility-Administered Medications:  .  methylPREDNISolone acetate (DEPO-MEDROL) injection 40 mg, 40 mg, Intramuscular, Once, Isaac Bliss, Rayford Halsted, MD  Review of Systems:  Constitutional: Denies fever, chills, diaphoresis, appetite change and fatigue.  HEENT: Denies photophobia, eye pain, redness, hearing loss, ear pain, congestion, sore throat, rhinorrhea, sneezing, mouth sores, trouble  swallowing, neck pain, neck stiffness and tinnitus.   Respiratory: Denies SOB, DOE, cough, chest tightness,  and wheezing.   Cardiovascular: Denies chest pain, palpitations and leg swelling.  Gastrointestinal: Denies nausea, vomiting, abdominal pain, diarrhea, constipation, blood in stool and abdominal distention.  Genitourinary: Denies dysuria, urgency, frequency, hematuria, flank pain and difficulty urinating.  Endocrine: Denies: hot or cold intolerance, sweats, changes in hair or nails, polyuria, polydipsia. Musculoskeletal: Positive for myalgias, back pain, joint swelling, arthralgias and gait problem.  Skin: Denies pallor, rash and wound.  Neurological: Denies dizziness, seizures, syncope, weakness, light-headedness, numbness and headaches.  Hematological: Denies adenopathy. Easy bruising, personal or family bleeding history  Psychiatric/Behavioral: Denies suicidal ideation, mood changes, confusion, nervousness, sleep disturbance and agitation    Physical Exam: Vitals:   02/21/20 1002  BP: 130/70  Pulse: 95  Temp: 98.6 F (37 C)  TempSrc: Oral  SpO2: 97%  Weight: 233 lb 3.2 oz (105.8 kg)  Height: 5' 3.5" (1.613 m)    Body mass index is 40.66 kg/m.   Constitutional: NAD, calm, comfortable, ambulates with a cane Eyes: PERRL, lids and conjunctivae normal, wears corrective lenses ENMT: Mucous membranes are moist.  Psychiatric: Normal judgment and insight. Alert and oriented x 3. Normal mood.    Impression and Plan:  Acute on chronic right-sided low back pain with right-sided sciatica  -Due to continued, severe pain, multiple prednisone courses, prior MRI result, I feel it is reasonable to update her lumbar spine imaging and refer her to neurosurgery. -I will also go ahead and give her 40 mg of Medrol today to provide some relief.  She is finishing up prednisone taper given by urgent care. -She would be a poor long-term opioid candidate given her age and fall  risk.   Patient Instructions  -Nice seeing you today!!  -Steroid injection today.  -MRI and appointment with the neurosurgeon will be arranged.     Lelon Frohlich, MD [facility]

## 2020-02-21 NOTE — Therapy (Signed)
[facility] [address] [place], [place], [address] Phone: [phone]   Fax:  [phone]  Physical Therapy Treatment  Patient Details  Name: Darlene Mclaughlin MRN: [id number] Date of Birth: April 06, 1950 Referring Provider (PT): Philip Aspen, Limmie Patricia, MD   Encounter Date: 02/21/2020   PT End of Session - 02/21/20 1212    Visit Number 8    Date for PT Re-Evaluation 04/08/20    Authorization Type [employer]    Authorization Time Period submitting for auth 01/15/20 to 04/09/19    Authorization - Visit Number 8    Authorization - Number of Visits 12    Progress Note Due on Visit 10    PT Start Time 1149    PT Stop Time 1225    PT Time Calculation (min) 36 min    Activity Tolerance Patient tolerated treatment well    Behavior During Therapy [facility] for tasks assessed/performed           Past Medical History:  Diagnosis Date  . ALLERGIC RHINITIS 05/15/2008  . Anemia   . ASTHMA 05/15/2008  . Blood transfusion without reported diagnosis 1991   anemia  . Breast cancer (HCC) 2016   right breast  . Cancer (HCC)    DCIS right breast  . Diabetes mellitus without complication (HCC)   . GERD (gastroesophageal reflux disease)   . Headache(784.0) 05/15/2008  . HYPERTENSION 05/15/2008  . IMPAIRED GLUCOSE TOLERANCE 05/15/2008  . Obesity   . OSTEOARTHRITIS 05/15/2008   back    Past Surgical History:  Procedure Laterality Date  . ABDOMINAL HYSTERECTOMY    . BREAST LUMPECTOMY Right 12/17/2014  . BREAST LUMPECTOMY WITH RADIOACTIVE SEED LOCALIZATION Right 12/17/2014   Procedure: RIGHT BREAST RADIOACTIVE SEED GUIDED PARTIAL MASTECTOMY;  Surgeon: Harriette Bouillon, MD;  Location: [facility];  Service: General;  Laterality: Right;  . CESAREAN SECTION    . KNEE SURGERY     arthroscopic left    There were no vitals filed for this visit.   Subjective Assessment - 02/21/20 1153    Subjective I am having a lot  of pain-I got an injection at the MD today and will get a referral to see a neurosurgeon and get an MRI    Currently in Pain? Yes    Pain Score 10-Worst pain ever    Pain Location Back    Pain Orientation Right;Lower    Pain Descriptors / Indicators Aching;Dull    Pain Type Acute pain    Pain Radiating Towards Rt LE    Pain Onset More than a month ago    Pain Frequency Constant    Aggravating Factors  everything    Pain Relieving Factors nothing helps                             [facility] Adult PT Treatment/Exercise - 02/21/20 0001      Lumbar Exercises: Aerobic   Nustep Level 1x 2.5 minutes (stopped due to pain)      Lumbar Exercises: Seated   Other Seated Lumbar Exercises 3 way raises: 1# 2x10      Modalities   Modalities Electrical Stimulation;Moist Heat      Moist Heat Therapy   Number Minutes Moist Heat 18 Minutes    Moist Heat Location Lumbar Spine      Electrical Stimulation   Electrical Stimulation Location Low back and Rt gluteals    Electrical Stimulation  Action IFC    Electrical Stimulation Parameters 18 minutes    Electrical Stimulation Goals Pain                  PT Education - 02/21/20 1210    Education Details information on home TENs unit    Person(s) Educated Patient    Methods Explanation;Demonstration;Handout    Comprehension Verbalized understanding;Returned demonstration            PT Short Term Goals - 02/12/20 1217      PT SHORT TERM GOAL #1   Title Pt will demo improved use of TrA with bed mobility and transfers.    Status Achieved      PT SHORT TERM GOAL #2   Title Pt will be educated on sleeping positions and report improved sleep with less pain at night by at least 20%    Baseline was improved wehn taking steroid, now not sleeping well again      PT SHORT TERM GOAL #3   Title Pt will demo symmetry in bil WB with sit to stand.    Baseline was symmetrical and now Lt>Rt    Time 4    Period Weeks    Status  On-going             PT Long Term Goals - 01/15/20 1352      PT LONG TERM GOAL #1   Title Pt will be able to stand x 20 min with min pain so that she may cook meals.    Time 12    Period Weeks    Status New    Target Date 04/08/20      PT LONG TERM GOAL #2   Title Pt will be able to participate in 6 min walk test with improved gait symmetry for increased tolerance of short distance community ambulation for appointments and shopping.    Time 12    Period Weeks    Status New    Target Date 04/08/20      PT LONG TERM GOAL #3   Title Pt with report at least 50% reduction in pain with daily tasks and sleep    Time 12    Period Weeks    Status New    Target Date 04/08/20      PT LONG TERM GOAL #4   Title Pt will improve functional strength of Rt LE and core to at least 4+/5 for improved tolerance of household tasks.    Time 12    Period Weeks    Status New    Target Date 04/08/20      PT LONG TERM GOAL #5   Title Reduce FOTO to </= 58% to demo less limitation.    Baseline 87%    Time 12    Period Weeks    Status New    Target Date 04/08/20                 Plan - 02/21/20 1211    Clinical Impression Statement Pt saw MD today and received another steroid injection and has referral for MRI and to see neurosurgeon due to continued and significant Rt LE pain.   Pt arrived in 10/10 pain and wanted to try some exercise.  Pt was significantly limited by pain.  Trial of electrical stimulation and information on home TENs provided for pain management at home.  Pt is awaiting call for MRI.  Pt will cancel next session and exercise as able at home.  Rehab Potential Good    PT Frequency 2x / week    PT Duration 12 weeks    PT Treatment/Interventions ADLs/Self Care Home Management;Aquatic Therapy;Cryotherapy;Electrical Stimulation;Moist Heat;Traction;Neuromuscular re-education;Therapeutic exercise;Therapeutic activities;Functional mobility training;Gait training;Stair  training;Patient/family education;Manual techniques;Dry needling;Passive range of motion;Taping;Spinal Manipulations;Joint Manipulations    PT Next Visit Plan see if pt got MRI, mobility and pain management    PT Home Exercise Plan Access Code: [email or web address]    Consulted and Agree with Plan of Care Patient           Patient will benefit from skilled therapeutic intervention in order to improve the following deficits and impairments:  Abnormal gait,Decreased range of motion,Difficulty walking,Obesity,Increased muscle spasms,Pain,Postural dysfunction,Decreased strength,Decreased mobility,Impaired flexibility,Improper body mechanics,Decreased activity tolerance  Visit Diagnosis: Muscle weakness (generalized)  Acute bilateral low back pain with right-sided sciatica  Abnormal posture  Other abnormalities of gait and mobility     Problem List Patient Active Problem List   Diagnosis Date Noted  . Statin intolerance 01/16/2018  . Bilateral leg edema 12/21/2017  . History of colonic polyps 10/18/2017  . Dyslipidemia 10/18/2017  . Essential hypertension 03/26/2016  . Family history of colon cancer 03/26/2016  . Osteopenia 08/28/2015  . 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 Spinal stenosis of lumbar region 12/05/2013  . Morbid obesity ([place]) 12/19/2012  . Diabetes mellitus with coincident hypertension ([place]) 05/15/2008  . Allergic rhinitis 05/15/2008  . Asthma, mild intermittent 05/15/2008  . Osteoarthritis 05/15/2008  . HEADACHE 05/15/2008    Sigurd Sos, PT 02/21/20 12:13 PM  [facility] [address], [place], [place], [address] Phone: [phone]   Fax:  [phone]  Name: Darlene Mclaughlin MRN: [id number] Date of Birth: March 17, 1950

## 2020-02-21 NOTE — Patient Instructions (Signed)
-  Nice seeing you today!!  -Steroid injection today.  -MRI and appointment with the neurosurgeon will be arranged.

## 2020-02-25 ENCOUNTER — Ambulatory Visit: Payer: Medicare PPO

## 2020-02-27 ENCOUNTER — Other Ambulatory Visit: Payer: Self-pay

## 2020-02-28 ENCOUNTER — Ambulatory Visit: Payer: Medicare PPO | Admitting: Internal Medicine

## 2020-02-28 ENCOUNTER — Ambulatory Visit: Payer: Medicare PPO | Attending: Internal Medicine

## 2020-02-28 ENCOUNTER — Other Ambulatory Visit: Payer: Self-pay

## 2020-02-28 DIAGNOSIS — R2689 Other abnormalities of gait and mobility: Secondary | ICD-10-CM

## 2020-02-28 DIAGNOSIS — M5441 Lumbago with sciatica, right side: Secondary | ICD-10-CM

## 2020-02-28 DIAGNOSIS — M6281 Muscle weakness (generalized): Secondary | ICD-10-CM

## 2020-02-28 DIAGNOSIS — R293 Abnormal posture: Secondary | ICD-10-CM

## 2020-02-28 NOTE — Therapy (Addendum)
[facility] [address], [place], [place], [address] Phone: [phone]   Fax:  [phone]  Physical Therapy Treatment  Patient Details  Name: Darlene Mclaughlin MRN: [id number] Date of Birth: Nov 24, 1950 Referring Provider (PT): Isaac Bliss, Rayford Halsted, MD   Encounter Date: 02/28/2020   PT End of Session - 02/28/20 1227    Visit Number 9    Date for PT Re-Evaluation 04/08/20    Authorization Type [employer]    Authorization Time Period submitting for auth 01/15/20 to 04/09/19    Authorization - Visit Number 9    Authorization - Number of Visits 12    Progress Note Due on Visit 10    PT Start Time 1150    PT Stop Time 1240    PT Time Calculation (min) 50 min    Activity Tolerance Patient tolerated treatment well    Behavior During Therapy [facility] for tasks assessed/performed           Past Medical History:  Diagnosis Date  . ALLERGIC RHINITIS 05/15/2008  . Anemia   . ASTHMA 05/15/2008  . Blood transfusion without reported diagnosis 1991   anemia  . Breast cancer ([place]) 2016   right breast  . Cancer ([place])    DCIS right breast  . Diabetes mellitus without complication ([place])   . GERD (gastroesophageal reflux disease)   . Headache(784.0) 05/15/2008  . HYPERTENSION 05/15/2008  . IMPAIRED GLUCOSE TOLERANCE 05/15/2008  . Obesity   . OSTEOARTHRITIS 05/15/2008   back    Past Surgical History:  Procedure Laterality Date  . ABDOMINAL HYSTERECTOMY    . BREAST LUMPECTOMY Right 12/17/2014  . BREAST LUMPECTOMY WITH RADIOACTIVE SEED LOCALIZATION Right 12/17/2014   Procedure: RIGHT BREAST RADIOACTIVE SEED GUIDED PARTIAL MASTECTOMY;  Surgeon: Erroll Luna, MD;  Location: [place];  Service: General;  Laterality: Right;  . CESAREAN SECTION    . KNEE SURGERY     arthroscopic left    There were no vitals filed for this visit.   Subjective Assessment - 02/28/20 1152    Subjective I went to the MD  today.  MRI has been ordered and I am still waiting on that.    Diagnostic tests xray pelvis and femur, LE doppler and ankle-brachial index: mild degnerative changes in lumbar spine and Rt medial knee and patellofemoral compartment    Currently in Pain? Yes    Pain Score 8     Pain Location Back    Pain Orientation Right;Lower    Pain Descriptors / Indicators Aching;Dull    Pain Type Acute pain    Pain Onset More than a month ago    Pain Frequency Constant    Aggravating Factors  all movement    Pain Relieving Factors nothing              [facility] PT Assessment - 02/28/20 0001      Assessment   Medical Diagnosis M79.604 (ICD-10-CM) - Right leg pain    Referring Provider (PT) Isaac Bliss, Rayford Halsted, MD      Observation/Other Assessments   Focus on Therapeutic Outcomes (FOTO)  79% limitation                         [facility] Adult PT Treatment/Exercise - 02/28/20 0001      Lumbar Exercises: Aerobic   Nustep Level 1x 5 minutes (stopped due to pain)      Lumbar Exercises: Standing  Row Both;20 reps    Theraband Level (Row) Level 2 (Red)      Lumbar Exercises: Seated   Long Arc Quad on Chair Strengthening;Both;2 sets;10 reps    Other Seated Lumbar Exercises 3 way raises: 1# 2x10    Other Seated Lumbar Exercises ball squeezes 15" hold x10      Moist Heat Therapy   Moist Heat Location Lumbar Spine      Electrical Stimulation   Electrical Stimulation Location Low back and Rt gluteals    Electrical Stimulation Action IFC    Electrical Stimulation Parameters 15 minutes    Electrical Stimulation Goals Pain                    PT Short Term Goals - 02/12/20 1217      PT SHORT TERM GOAL #1   Title Pt will demo improved use of TrA with bed mobility and transfers.    Status Achieved      PT SHORT TERM GOAL #2   Title Pt will be educated on sleeping positions and report improved sleep with less pain at night by at least 20%    Baseline was improved  wehn taking steroid, now not sleeping well again      PT SHORT TERM GOAL #3   Title Pt will demo symmetry in bil WB with sit to stand.    Baseline was symmetrical and now Lt>Rt    Time 4    Period Weeks    Status On-going             PT Long Term Goals - 02/28/20 1153      PT LONG TERM GOAL #1   Title Pt will be able to stand x 20 min with min pain so that she may cook meals.    Status Not Met      PT LONG TERM GOAL #2   Title Pt will be able to participate in 6 min walk test with improved gait symmetry for increased tolerance of short distance community ambulation for appointments and shopping.    Baseline too much pain    Status Deferred      PT LONG TERM GOAL #3   Title Pt with report at least 50% reduction in pain with daily tasks and sleep    Status Not Met      PT LONG TERM GOAL #4   Title Pt will improve functional strength of Rt LE and core to at least 4+/5 for improved tolerance of household tasks.    Status On-going      PT LONG TERM GOAL #5   Title Reduce FOTO to </= 58% to demo less limitation.    Baseline 79% limitation    Status On-going                 Plan - 02/28/20 1201    Clinical Impression Statement Pt arrives today with slow mobility and 7/10 Rt LE pain.  Pt is not able to stand > 3-5 minutes due to pain.  FOTO continues to be significantly limited but is improved to 79% limitation.  Pt tolerated some exercise today and ended session with electrical stimulation as this was helpful last session.  Pt has not ordered a home unit yet and PT encouraged her to do this for pain management.  Pt will be placed on hold until after her MRI.    Rehab Potential Good    PT Frequency 2x / week  PT Duration 12 weeks    PT Treatment/Interventions ADLs/Self Care Home Management;Aquatic Therapy;Cryotherapy;Electrical Stimulation;Moist Heat;Traction;Neuromuscular re-education;Therapeutic exercise;Therapeutic activities;Functional mobility training;Gait  training;Stair training;Patient/family education;Manual techniques;Dry needling;Passive range of motion;Taping;Spinal Manipulations;Joint Manipulations    PT Next Visit Plan Hold chart until MRI    PT Home Exercise Plan Access Code: [place] and Agree with Plan of Care Patient           Patient will benefit from skilled therapeutic intervention in order to improve the following deficits and impairments:  Abnormal gait,Decreased range of motion,Difficulty walking,Obesity,Increased muscle spasms,Pain,Postural dysfunction,Decreased strength,Decreased mobility,Impaired flexibility,Improper body mechanics,Decreased activity tolerance  Visit Diagnosis: Muscle weakness (generalized)  Acute bilateral low back pain with right-sided sciatica  Abnormal posture  Other abnormalities of gait and mobility     Problem List Patient Active Problem List   Diagnosis Date Noted  . Statin intolerance 01/16/2018  . Bilateral leg edema 12/21/2017  . History of colonic polyps 10/18/2017  . Dyslipidemia 10/18/2017  . Essential hypertension 03/26/2016  . Family history of colon cancer 03/26/2016  . Osteopenia 08/28/2015  . 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 Spinal stenosis of lumbar region 12/05/2013  . Morbid obesity ([place]) 12/19/2012  . Diabetes mellitus with coincident hypertension ([place]) 05/15/2008  . Allergic rhinitis 05/15/2008  . Asthma, mild intermittent 05/15/2008  . Osteoarthritis 05/15/2008  . HEADACHE 05/15/2008     Sigurd Sos, PT 02/28/20 12:29 PM PHYSICAL THERAPY DISCHARGE SUMMARY  Visits from Start of Care: 9  Current functional level related to goals / functional outcomes: See above for current status.  Pt returned to MD for further assessment.     Remaining deficits: Pain that limits function.  Pt was going to follow-up with MD.    Education / Equipment: HEP, body mechanics Plan: Patient agrees to discharge.  Patient goals were  partially met. Patient is being discharged due to lack of progress.  ?????  Sigurd Sos, PT 04/16/20 7:59 AM        [facility] [address], [place], [place], [address] Phone: [phone]   Fax:  [phone]  Name: Darlene Mclaughlin MRN: [id number] Date of Birth: [birth year]

## 2020-03-06 DIAGNOSIS — J301 Allergic rhinitis due to pollen: Secondary | ICD-10-CM | POA: Diagnosis not present

## 2020-03-06 DIAGNOSIS — J3089 Other allergic rhinitis: Secondary | ICD-10-CM | POA: Diagnosis not present

## 2020-03-18 DIAGNOSIS — J3089 Other allergic rhinitis: Secondary | ICD-10-CM | POA: Diagnosis not present

## 2020-03-18 DIAGNOSIS — J301 Allergic rhinitis due to pollen: Secondary | ICD-10-CM | POA: Diagnosis not present

## 2020-03-19 ENCOUNTER — Other Ambulatory Visit: Payer: Self-pay | Admitting: Internal Medicine

## 2020-03-19 ENCOUNTER — Telehealth: Payer: Self-pay | Admitting: Internal Medicine

## 2020-03-19 DIAGNOSIS — G8929 Other chronic pain: Secondary | ICD-10-CM

## 2020-03-19 DIAGNOSIS — M545 Low back pain, unspecified: Secondary | ICD-10-CM

## 2020-03-19 DIAGNOSIS — M5441 Lumbago with sciatica, right side: Secondary | ICD-10-CM

## 2020-03-19 MED ORDER — PREDNISONE 10 MG (21) PO TBPK: ORAL_TABLET | ORAL | 0 refills | Status: DC

## 2020-03-19 NOTE — Telephone Encounter (Signed)
Prednisone sent

## 2020-03-19 NOTE — Telephone Encounter (Signed)
Patient is aware 

## 2020-03-19 NOTE — Telephone Encounter (Signed)
Pt would like Darlene Mclaughlin to call her back to know if she can have prednisone. Because she said her MRI is out too far and can't wait that long, and she is in pain. I talked to the patient on 03/18/2020 to schedule her MRI.  I  did not get off the phone until it was scheduled, and they offered her the first available, and she accepted the appt. And I called her earlier that day to inform her that [place] her to screen her also

## 2020-03-20 ENCOUNTER — Ambulatory Visit: Payer: Medicare PPO | Admitting: Internal Medicine

## 2020-04-01 DIAGNOSIS — J3089 Other allergic rhinitis: Secondary | ICD-10-CM | POA: Diagnosis not present

## 2020-04-01 DIAGNOSIS — J301 Allergic rhinitis due to pollen: Secondary | ICD-10-CM | POA: Diagnosis not present

## 2020-04-05 ENCOUNTER — Ambulatory Visit
Admission: RE | Admit: 2020-04-05 | Discharge: 2020-04-05 | Disposition: A | Discharge status: Home / Self Care | Payer: Medicare PPO | Source: Ambulatory Visit | Attending: Internal Medicine | Admitting: Internal Medicine

## 2020-04-05 ENCOUNTER — Other Ambulatory Visit: Payer: Self-pay

## 2020-04-05 DIAGNOSIS — M545 Low back pain, unspecified: Secondary | ICD-10-CM | POA: Diagnosis not present

## 2020-04-05 DIAGNOSIS — M48061 Spinal stenosis, lumbar region without neurogenic claudication: Secondary | ICD-10-CM | POA: Diagnosis not present

## 2020-04-05 DIAGNOSIS — M5441 Lumbago with sciatica, right side: Secondary | ICD-10-CM

## 2020-04-09 ENCOUNTER — Encounter: Payer: Self-pay | Admitting: Internal Medicine

## 2020-04-09 ENCOUNTER — Other Ambulatory Visit: Payer: Self-pay | Admitting: Internal Medicine

## 2020-04-09 DIAGNOSIS — M5441 Lumbago with sciatica, right side: Secondary | ICD-10-CM

## 2020-04-11 ENCOUNTER — Telehealth: Payer: Self-pay | Admitting: Internal Medicine

## 2020-04-11 NOTE — Telephone Encounter (Signed)
Pt is in a lot of pain; her referral was sent to [place] neurosurgery, awaiting to be scheduled. pt wants to know what could she do about her pain

## 2020-04-14 ENCOUNTER — Other Ambulatory Visit: Payer: Self-pay | Admitting: Internal Medicine

## 2020-04-14 DIAGNOSIS — Z6839 Body mass index (BMI) 39.0-39.9, adult: Secondary | ICD-10-CM | POA: Diagnosis not present

## 2020-04-14 DIAGNOSIS — G8929 Other chronic pain: Secondary | ICD-10-CM

## 2020-04-14 DIAGNOSIS — M5416 Radiculopathy, lumbar region: Secondary | ICD-10-CM | POA: Diagnosis not present

## 2020-04-14 DIAGNOSIS — M545 Low back pain, unspecified: Secondary | ICD-10-CM

## 2020-04-14 DIAGNOSIS — I10 Essential (primary) hypertension: Secondary | ICD-10-CM | POA: Diagnosis not present

## 2020-04-14 MED ORDER — TRAMADOL HCL 50 MG PO TABS: 50.0000 mg | ORAL_TABLET | Freq: Two times a day (BID) | ORAL | 0 refills | Status: DC | PRN

## 2020-04-14 NOTE — Telephone Encounter (Signed)
I sent her a refill of tramadol.

## 2020-04-16 ENCOUNTER — Other Ambulatory Visit: Payer: Self-pay | Admitting: Surgery

## 2020-04-16 ENCOUNTER — Other Ambulatory Visit (HOSPITAL_COMMUNITY): Payer: Self-pay | Admitting: Neurosurgery

## 2020-04-16 ENCOUNTER — Other Ambulatory Visit: Payer: Self-pay | Admitting: Internal Medicine

## 2020-04-16 ENCOUNTER — Other Ambulatory Visit: Payer: Self-pay | Admitting: Neurosurgery

## 2020-04-16 DIAGNOSIS — M5441 Lumbago with sciatica, right side: Secondary | ICD-10-CM

## 2020-04-16 DIAGNOSIS — J301 Allergic rhinitis due to pollen: Secondary | ICD-10-CM | POA: Diagnosis not present

## 2020-04-16 DIAGNOSIS — J3089 Other allergic rhinitis: Secondary | ICD-10-CM | POA: Diagnosis not present

## 2020-04-16 DIAGNOSIS — M5416 Radiculopathy, lumbar region: Secondary | ICD-10-CM

## 2020-04-16 MED ORDER — PREDNISONE 10 MG (21) PO TBPK: ORAL_TABLET | ORAL | 0 refills | Status: DC

## 2020-04-23 ENCOUNTER — Encounter: Payer: Self-pay | Admitting: Internal Medicine

## 2020-04-23 ENCOUNTER — Ambulatory Visit: Payer: Medicare PPO | Admitting: Internal Medicine

## 2020-04-23 ENCOUNTER — Other Ambulatory Visit: Payer: Self-pay

## 2020-04-23 VITALS — BP 120/64 | HR 96 | Ht 64.0 in | Wt 227.6 lb

## 2020-04-23 DIAGNOSIS — E119 Type 2 diabetes mellitus without complications: Secondary | ICD-10-CM | POA: Diagnosis not present

## 2020-04-23 DIAGNOSIS — I10 Essential (primary) hypertension: Secondary | ICD-10-CM | POA: Diagnosis not present

## 2020-04-23 DIAGNOSIS — E785 Hyperlipidemia, unspecified: Secondary | ICD-10-CM

## 2020-04-23 NOTE — Progress Notes (Signed)
LIPID CLINIC CONSULT NOTE  Chief Complaint:  Dyslipidemia, statin intolerance  Primary Care Physician: Isaac Bliss, Rayford Halsted, MD  Primary Cardiologist:  No primary care provider on file.  HPI:  Darlene Mclaughlin is a 70 y.o. female who is being seen today for the evaluation of dyslipidemia at the request of Isaac Bliss, [place]*.  This is a pleasant 70 year old female kindly referred for evaluation and management of dyslipidemia.  More recently she has been struggling with orthopedic issues including low back pain and difficulty ambulating.  Is noted that she has previous intolerances to atorvastatin and simvastatin causing hallucinations and muscle pains.  She is diabetic on low-dose Metformin but apparently well controlled.  Other cardiovascular risk factors include near morbid obesity and hypertension, again well controlled.  She had lipids in July which showed total cholesterol 196, HDL 50, LDL 122 and triglycerides 129.  A1c is 6.5.  She reports a reasonably healthy diet but has been much less active because of her back issues.  PMHx:  Past Medical History:  Diagnosis Date  . ALLERGIC RHINITIS 05/15/2008  . Anemia   . ASTHMA 05/15/2008  . Blood transfusion without reported diagnosis 1991   anemia  . Breast cancer ([place]) 2016   right breast  . Cancer ([place])    DCIS right breast  . Diabetes mellitus without complication ([place])   . GERD (gastroesophageal reflux disease)   . Headache(784.0) 05/15/2008  . HYPERTENSION 05/15/2008  . IMPAIRED GLUCOSE TOLERANCE 05/15/2008  . Obesity   . OSTEOARTHRITIS 05/15/2008   back    Past Surgical History:  Procedure Laterality Date  . ABDOMINAL HYSTERECTOMY    . BREAST LUMPECTOMY Right 12/17/2014  . BREAST LUMPECTOMY WITH RADIOACTIVE SEED LOCALIZATION Right 12/17/2014   Procedure: RIGHT BREAST RADIOACTIVE SEED GUIDED PARTIAL MASTECTOMY;  Surgeon: Erroll Luna, MD;  Location: [place];  Service: General;  Laterality:  Right;  . CESAREAN SECTION    . KNEE SURGERY     arthroscopic left    FAMHx:  Family History  Problem Relation Age of Onset  . Diabetes Mother   . Hypertension Mother   . Breast cancer Mother 25       Breast cancer twice   . Hyperlipidemia Father   . Colon cancer Brother        4 th brother  . Diabetes Brother   . Pancreatic cancer Brother        middle brother  . Stomach cancer Neg Hx     SOCHx:   reports that she has never smoked. She has never used smokeless tobacco. She reports current alcohol use. She reports that she does not use drugs.  ALLERGIES:  Allergies  Allergen Reactions  . Atorvastatin   . Hydrochlorothiazide   . Macrobid [Nitrofurantoin Monohyd Macro]      Liver problems  . Naproxen   . Simvastatin     Hallucinations, muscle pains  . Zanaflex [Tizanidine Hcl] Other (See Comments)     Liver problems    ROS: Pertinent items noted in HPI and remainder of comprehensive ROS otherwise negative.  HOME MEDS: Current Outpatient Medications on File Prior to Visit  Medication Sig Dispense Refill  . Accu-Chek FastClix Lancets MISC 1 EACH BY DOES NOT APPLY ROUTE DAILY. USE FOR ACCU-CHEK GUIDE 102 each 3  . ACCU-CHEK GUIDE test strip 1 EACH BY OTHER ROUTE DAILY. 100 strip 3  . acetaminophen (TYLENOL) 650 MG CR tablet Take 650 mg by mouth every 8 (eight) hours  as needed.    Marland Kitchen aspirin 81 MG tablet Take 81 mg by mouth daily.    Marland Kitchen azelastine (OPTIVAR) 0.05 % ophthalmic solution Place 1 drop into both eyes as needed.    . blood glucose meter kit and supplies KIT Dispense based on patient and insurance preference. Use up to twice daily as directed. 1 each 0  . CALCIUM PO Take 1,000 mg by mouth daily.    . cetirizine (ZYRTEC) 10 MG tablet Take 10 mg by mouth as needed.    . chlorthalidone (HYGROTON) 25 MG tablet TAKE 1 TABLET BY MOUTH EVERY DAY 90 tablet 1  . EPIPEN 2-PAK 0.3 MG/0.3ML SOAJ injection 0.3 mg. Reported on 05/01/2015  1  . fluticasone (FLONASE) 50 MCG/ACT  nasal spray Place 1 spray into both nostrils daily. 16 g 5  . Melatonin 10 MG TABS Take 1 tablet by mouth at bedtime.    . metFORMIN (GLUCOPHAGE-XR) 500 MG 24 hr tablet TAKE 2 TABLETS (1,000 MG TOTAL) BY MOUTH DAILY WITH SUPPER. 180 tablet 1  . Multiple Vitamin (MULTIVITAMIN) tablet Take 1 tablet by mouth daily.    . NON FORMULARY EVERY OTHER WEEK ALLERGY INJECTIONS    . potassium chloride SA (KLOR-CON M20) 20 MEQ tablet Take 1 tablet (20 mEq total) by mouth 2 (two) times daily. 180 tablet 1  . PROAIR HFA 108 (90 BASE) MCG/ACT inhaler Inhale 1 puff into the lungs every 6 (six) hours as needed.   0  . traMADol (ULTRAM) 50 MG tablet Take 1 tablet (50 mg total) by mouth 2 (two) times daily as needed for severe pain. 60 tablet 0   No current facility-administered medications on file prior to visit.    LABS/IMAGING: No results found for this or any previous visit (from the past 48 hour(s)). No results found.  LIPID PANEL:    Component Value Date/Time   CHOL 196 09/18/2019 0908   TRIG 129 09/18/2019 0908   HDL 50 09/18/2019 0908   CHOLHDL 3.9 09/18/2019 0908   VLDL 24.4 05/09/2018 0823   LDLCALC 122 (H) 09/18/2019 0908    WEIGHTS: Wt Readings from Last 3 Encounters:  04/23/20 227 lb 9.6 oz (103.2 kg)  02/21/20 233 lb 3.2 oz (105.8 kg)  01/23/20 242 lb 8 oz (110 kg)    VITALS: BP 120/64 (BP Location: Left Arm, Patient Position: Sitting)   Pulse 96   Ht _0  (1.626 m)   Wt 227 lb 9.6 oz (103.2 kg)   SpO2 97%   BMI 39.07 kg/m   EXAM: General appearance: alert and no distress Neck: no carotid bruit, no JVD and thyroid not enlarged, symmetric, no tenderness/mass/nodules Lungs: clear to auscultation bilaterally Heart: regular rate and rhythm, S1, S2 normal, no murmur, click, rub or gallop Abdomen: soft, non-tender; bowel sounds normal; no masses,  no organomegaly Extremities: extremities normal, atraumatic, no cyanosis or edema Pulses: 2+ and symmetric Skin: Skin color,  texture, turgor normal. No rashes or lesions Neurologic: Grossly normal psych: pleasant   *Examination chaperoned by Sheral Apley, RN.  EKG: Deferred  ASSESSMENT: 1. 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 Type 2 diabetes 3. Hypertension 4. Morbid obesity 5. Statin myalgias  PLAN: 1.   Mrs. Molony has a mixed dyslipidemia and her cholesterol remains above target.  That being said more recently she has been sedentary and struggling with back issues.  She may have to have surgery.  She is also been on and off of steroids.  This can adversely affect her  cholesterol.  I advised her to continue to work on diet and more activity.  As she improves somewhat we should consider repeating her lipids in about 3 months.  Hopefully at that point we can determine whether additional therapy is necessary.  We may wish to consider low-dose rosuvastatin 5 mg or ezetimibe.  She is agreeable to this plan.  Thanks again for the kind referral  Pixie Casino, MD, FACC, [place] Director of the [facility]  [facility] Diplomate of the American Board of Clinical Lipidology Attending Cardiologist  Direct Dial: [phone]  Fax: [phone]  Website:  [email or web address] [email or web address] [email or web address] 04/23/2020, 1:33 PM

## 2020-04-23 NOTE — Patient Instructions (Signed)
Medication Instructions:  Your physician recommends that you continue on your current medications as directed. Please refer to the Current Medication list given to you today.  *If you need a refill on your cardiac medications before your next appointment, please call your pharmacy*   Lab Work: FASTING lipid panel in 3 months to check cholesterol  Please complete about 1 week before your next visit  If you have labs (blood work) drawn today and your tests are completely normal, you will receive your results only by: Marland Kitchen MyChart Message (if you have MyChart) OR . A paper copy in the mail If you have any lab test that is abnormal or we need to change your treatment, we will call you to review the results.   Testing/Procedures: NONE   Follow-Up: At [facility], you and your health needs are our priority.  As part of our continuing mission to provide you with exceptional heart care, we have created designated Provider Care Teams.  These Care Teams include your primary Cardiologist (physician) and Advanced Practice Providers (APPs -  Physician Assistants and Nurse Practitioners) who all work together to provide you with the care you need, when you need it.  We recommend signing up for the patient portal called "MyChart".  Sign up information is provided on this After Visit Summary.  MyChart is used to connect with patients for Virtual Visits (Telemedicine).  Patients are able to view lab/test results, encounter notes, upcoming appointments, etc.  Non-urgent messages can be sent to your provider as well.   To learn more about what you can do with MyChart, go to [email or web address].    Your next appointment:   3 month(s) - lipid clinic  The format for your next appointment:   In Person  Provider:   K. Mali Hilty, MD   Other Instructions

## 2020-04-28 ENCOUNTER — Other Ambulatory Visit: Payer: Self-pay

## 2020-04-28 ENCOUNTER — Ambulatory Visit (HOSPITAL_COMMUNITY)
Admission: RE | Admit: 2020-04-28 | Discharge: 2020-04-28 | Disposition: A | Discharge status: Home / Self Care | Payer: Medicare PPO | Source: Ambulatory Visit | Attending: Neurosurgery | Admitting: Neurosurgery

## 2020-04-28 DIAGNOSIS — M5416 Radiculopathy, lumbar region: Secondary | ICD-10-CM

## 2020-04-28 DIAGNOSIS — M4156 Other secondary scoliosis, lumbar region: Secondary | ICD-10-CM | POA: Diagnosis not present

## 2020-04-28 DIAGNOSIS — M545 Low back pain, unspecified: Secondary | ICD-10-CM | POA: Diagnosis not present

## 2020-04-30 ENCOUNTER — Other Ambulatory Visit: Payer: Self-pay | Admitting: Neurosurgery

## 2020-05-01 DIAGNOSIS — J3081 Allergic rhinitis due to animal (cat) (dog) hair and dander: Secondary | ICD-10-CM | POA: Diagnosis not present

## 2020-05-01 DIAGNOSIS — J301 Allergic rhinitis due to pollen: Secondary | ICD-10-CM | POA: Diagnosis not present

## 2020-05-01 DIAGNOSIS — J3089 Other allergic rhinitis: Secondary | ICD-10-CM | POA: Diagnosis not present

## 2020-05-05 ENCOUNTER — Other Ambulatory Visit: Payer: Self-pay | Admitting: Neurosurgery

## 2020-05-07 ENCOUNTER — Telehealth: Payer: Self-pay | Admitting: Internal Medicine

## 2020-05-07 NOTE — Telephone Encounter (Signed)
Pt call and stated she want a call back to talk about some medication she need to come off days  before her surgery. Her surgery is on 05/21/20.

## 2020-05-07 NOTE — Telephone Encounter (Signed)
Might be best to do that

## 2020-05-07 NOTE — Telephone Encounter (Signed)
Patient would like to know if she should stop her medication before surgery?  Would you like a surgical clearance appointment?

## 2020-05-07 NOTE — Telephone Encounter (Signed)
Appointment scheduled.

## 2020-05-08 ENCOUNTER — Other Ambulatory Visit: Payer: Self-pay

## 2020-05-08 ENCOUNTER — Encounter: Payer: Self-pay | Admitting: Internal Medicine

## 2020-05-08 ENCOUNTER — Ambulatory Visit: Payer: Medicare PPO | Admitting: Internal Medicine

## 2020-05-08 VITALS — BP 120/70 | HR 102 | Temp 98.3°F | Wt 227.3 lb

## 2020-05-08 DIAGNOSIS — E119 Type 2 diabetes mellitus without complications: Secondary | ICD-10-CM

## 2020-05-08 DIAGNOSIS — E785 Hyperlipidemia, unspecified: Secondary | ICD-10-CM | POA: Diagnosis not present

## 2020-05-08 DIAGNOSIS — I10 Essential (primary) hypertension: Secondary | ICD-10-CM

## 2020-05-08 DIAGNOSIS — Z789 Other specified health status: Secondary | ICD-10-CM | POA: Diagnosis not present

## 2020-05-08 DIAGNOSIS — Z01818 Encounter for other preprocedural examination: Secondary | ICD-10-CM

## 2020-05-08 LAB — POCT GLYCOSYLATED HEMOGLOBIN (HGB A1C): Hemoglobin A1C: 8.1 % — AB (ref 4.0–5.6)

## 2020-05-08 NOTE — Progress Notes (Signed)
Established Patient Office Visit     This visit occurred during the SARS-CoV-2 public health emergency.  Safety protocols were in place, including screening questions prior to the visit, additional usage of staff PPE, and extensive cleaning of exam room while observing appropriate contact time as indicated for disinfecting solutions.    CC/Reason for Visit: Preoperative clearance  HPI: Darlene Mclaughlin is a 70 y.o. female who is coming in today for the above mentioned reasons.  She has been having severe back and right leg pain now for some time.  I finally ordered an MRI with findings as below:  IMPRESSION: 1. Symptomatic level with regard to right side radiating pain is favored to be L4-L5, where bulky increased right foraminal disc herniation since 2016 now results in severe right foraminal stenosis. Query Right L4 radiculitis.  2. 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 - moderate to severe increased neural foraminal stenosis also at the left L2, left L3 nerve levels. - mild new spinal stenosis at L1-L2 and L5-S1. And mild to moderate lateral recess and foraminal stenosis at the latter.  With these results, she was referred to neurosurgery.  They are planning surgery that has been scheduled for March 30.  She is on aspirin.  Her A1c has unsurprisingly increased to 8.1 with all the steroids she has received over the past 4 months.  Her A1c is usually well controlled and under 6.5.  Her blood pressure has been well controlled.  She has no chest pain or shortness of breath and her activity is only limited by her back and right leg pain.    Past Medical/Surgical History: Past Medical History:  Diagnosis Date  . ALLERGIC RHINITIS 05/15/2008  . Anemia   . ASTHMA 05/15/2008  . Blood  transfusion without reported diagnosis 1991   anemia  . Breast cancer ([place]) 2016   right breast  . 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 Diabetes mellitus without complication ([place])   . GERD (gastroesophageal reflux disease)   . Headache(784.0) 05/15/2008  . HYPERTENSION 05/15/2008  . IMPAIRED GLUCOSE TOLERANCE 05/15/2008  . Obesity   . OSTEOARTHRITIS 05/15/2008   back    Past Surgical History:  Procedure Laterality Date  . ABDOMINAL HYSTERECTOMY    . BREAST LUMPECTOMY Right 12/17/2014  . BREAST LUMPECTOMY WITH RADIOACTIVE SEED LOCALIZATION Right 12/17/2014   Procedure: RIGHT BREAST RADIOACTIVE SEED GUIDED PARTIAL MASTECTOMY;  Surgeon: Erroll Luna, MD;  Location: [facility];  Service: General;  Laterality: Right;  . CESAREAN SECTION    . KNEE SURGERY     arthroscopic left    Social History:  reports that she has never smoked. She has never used smokeless tobacco. She reports current alcohol use. She reports that she does not use drugs.  Allergies: Allergies  Allergen Reactions  . Atorvastatin     Hallucinations   . Hydrochlorothiazide     Muscle pain  . Macrobid [Nitrofurantoin Monohyd Macro]      Liver problems  . Naproxen Swelling    Mouth and tongue swelling  . Simvastatin     Hallucinations, muscle pains  . Zanaflex [Tizanidine Hcl] Other (See Comments)     Liver problems    Family History:  Family History  Problem Relation Age of Onset  . Diabetes Mother   . Hypertension Mother   .  Breast cancer Mother 66       Breast cancer twice   . Hyperlipidemia Father   . Colon cancer Brother        4 th brother  . Diabetes Brother   . Pancreatic cancer Brother        middle brother  . Stomach cancer Neg Hx      Current Outpatient Medications:  .  Accu-Chek FastClix Lancets MISC, 1 EACH BY DOES NOT APPLY ROUTE DAILY. USE FOR ACCU-CHEK GUIDE, Disp: 102 each, Rfl: 3 .  ACCU-CHEK GUIDE test strip, 1 EACH BY OTHER ROUTE DAILY., Disp: 100 strip, Rfl:  3 .  acetaminophen (TYLENOL) 650 MG CR tablet, Take 650 mg by mouth every 8 (eight) hours as needed for pain., Disp: , Rfl:  .  aspirin 81 MG tablet, Take 81 mg by mouth daily., Disp: , Rfl:  .  azelastine (OPTIVAR) 0.05 % ophthalmic solution, Place 1 drop into both eyes daily as needed (allergies)., Disp: , Rfl:  .  blood glucose meter kit and supplies KIT, Dispense based on patient and insurance preference. Use up to twice daily as directed., Disp: 1 each, Rfl: 0 .  CALCIUM PO, Take 1,000 mg by mouth daily., Disp: , Rfl:  .  cetirizine (ZYRTEC) 10 MG tablet, Take 10 mg by mouth daily as needed for allergies., Disp: , Rfl:  .  chlorthalidone (HYGROTON) 25 MG tablet, TAKE 1 TABLET BY MOUTH EVERY DAY (Patient taking differently: Take 25 mg by mouth daily.), Disp: 90 tablet, Rfl: 1 .  EPIPEN 2-PAK 0.3 MG/0.3ML SOAJ injection, Inject 0.3 mg into the muscle as needed for anaphylaxis., Disp: , Rfl: 1 .  fluticasone (FLONASE) 50 MCG/ACT nasal spray, Place 1 spray into both nostrils daily. (Patient taking differently: Place 1 spray into both nostrils daily as needed for allergies.), Disp: 16 g, Rfl: 5 .  Homeopathic Products (ARNICA EX), Apply 1 application topically daily as needed (pain). Similasan Arnica Active pain relieving spray, Disp: , Rfl:  .  Melatonin 10 MG TABS, Take 10 mg by mouth at bedtime as needed (sleep)., Disp: , Rfl:  .  metFORMIN (GLUCOPHAGE-XR) 500 MG 24 hr tablet, TAKE 2 TABLETS (1,000 MG TOTAL) BY MOUTH DAILY WITH SUPPER. (Patient taking differently: Take 500 mg by mouth 2 (two) times daily.), Disp: 180 tablet, Rfl: 1 .  Multiple Vitamin (MULTIVITAMIN) tablet, Take 1 tablet by mouth daily., Disp: , Rfl:  .  NON FORMULARY, EVERY OTHER WEEK ALLERGY INJECTIONS, Disp: , Rfl:  .  OVER THE COUNTER MEDICATION, Apply 1 application topically daily as needed (pain). KT recovery pain relieving gel, Disp: , Rfl:  .  potassium chloride SA (KLOR-CON M20) 20 MEQ tablet, Take 1 tablet (20 mEq total)  by mouth 2 (two) times daily., Disp: 180 tablet, Rfl: 1 .  PROAIR HFA 108 (90 BASE) MCG/ACT inhaler, Inhale 1 puff into the lungs every 6 (six) hours as needed for shortness of breath or wheezing., Disp: , Rfl: 0 .  Simethicone (GAS-X PO), Take 1 tablet by mouth daily as needed (gas)., Disp: , Rfl:  .  traMADol (ULTRAM) 50 MG tablet, Take 1 tablet (50 mg total) by mouth 2 (two) times daily as needed for severe pain., Disp: 60 tablet, Rfl: 0  Review of Systems:  Constitutional: Denies fever, chills, diaphoresis, appetite change and fatigue.  HEENT: Denies photophobia, eye pain, redness, hearing loss, ear pain, congestion, sore throat, rhinorrhea, sneezing, mouth sores, trouble swallowing, neck pain, neck stiffness and tinnitus.  Respiratory: Denies SOB, DOE, cough, chest tightness,  and wheezing.   Cardiovascular: Denies chest pain, palpitations and leg swelling.  Gastrointestinal: Denies nausea, vomiting, abdominal pain, diarrhea, constipation, blood in stool and abdominal distention.  Genitourinary: Denies dysuria, urgency, frequency, hematuria, flank pain and difficulty urinating.  Endocrine: Denies: hot or cold intolerance, sweats, changes in hair or nails, polyuria, polydipsia. Musculoskeletal: Positive for myalgias, back pain, joint swelling, arthralgias and gait problem.  Skin: Denies pallor, rash and wound.  Neurological: Denies dizziness, seizures, syncope, weakness, light-headedness, numbness and headaches.  Hematological: Denies adenopathy. Easy bruising, personal or family bleeding history  Psychiatric/Behavioral: Denies suicidal ideation, mood changes, confusion, nervousness, sleep disturbance and agitation    Physical Exam: Vitals:   05/08/20 1059  BP: 120/70  Pulse: (!) 102  Temp: 98.3 F (36.8 C)  TempSrc: Oral  SpO2: 99%  Weight: 227 lb 4.8 oz (103.1 kg)    Body mass index is 39.02 kg/m.   Constitutional: NAD, calm, comfortable, ambulates with a cane Eyes:  PERRL, lids and conjunctivae normal, wears corrective lenses ENMT: Mucous membranes are moist.  Respiratory: clear to auscultation bilaterally, no wheezing, no crackles. Normal respiratory effort. No accessory muscle use.  Cardiovascular: Regular rate and rhythm, no murmurs / rubs / gallops. No extremity edema. Psychiatric: Normal judgment and insight. Alert and oriented x 3. Normal mood.    Impression and Plan:  Diabetes mellitus without complication (HCC)  -T3S has increased to 8.1 likely due to steroid effect.  She has had multiple courses of prednisone over the last 4 months for her back pain with radiculopathy. -Her A1c previously had always been well controlled and under 6.5.  Continue lifestyle changes, recheck in 3 months.  Pre-operative clearance -Okay to proceed to surgery without further intervention. -Advised to stop aspirin 5 to 7 days prior to surgery and resume as soon as possible thereafter.  Dyslipidemia Statin intolerance -Lipids are above goal, she recently saw Dr. Debara Pickett with the advanced lipid clinic, no medication changes were made at present.  Essential hypertension -Excellent control     Liset Mcmonigle Isaac Bliss, MD [facility] at [facility]

## 2020-05-12 DIAGNOSIS — J301 Allergic rhinitis due to pollen: Secondary | ICD-10-CM | POA: Diagnosis not present

## 2020-05-12 DIAGNOSIS — J3089 Other allergic rhinitis: Secondary | ICD-10-CM | POA: Diagnosis not present

## 2020-05-16 NOTE — Pre-Procedure Instructions (Signed)
Surgical Instructions:    Your procedure is scheduled on Wednesday, 05/21/20.  Report to [facility] Main Entrance "A" at 05:30 A.M., then check in with the Admitting office.  Call this number if you have problems the morning of surgery:  [phone]   If you have any questions prior to your surgery date call [phone]: Open Monday-Friday 8am-4pm.    Remember:  Do not eat or drink after midnight the night before your surgery.     Take these medicines the morning of surgery with A SIP OF WATER:  IF NEEDED: acetaminophen (TYLENOL) azelastine (OPTIVAR) eye drops cetirizine (ZYRTEC) fluticasone (FLONASE)  Simethicone traMADol (ULTRAM) PROAIR HFA 108 (90 BASE) inhaler  As of today, STOP taking any Aspirin (unless otherwise instructed by your surgeon) Aleve, Naproxen, Ibuprofen, Motrin, Advil, Goody's, BC's, all herbal medications, fish oil, and all vitamins.           WHAT DO I DO ABOUT MY DIABETES MEDICATION?  Marland Kitchen Do not take oral diabetes medicines (pills)- metFORMIN (GLUCOPHAGE-XR) the morning of surgery.   HOW TO MANAGE YOUR DIABETES BEFORE AND AFTER SURGERY  Why is it important to control my blood sugar before and after surgery? . Improving blood sugar levels before and after surgery helps healing and can limit problems. . A way of improving blood sugar control is eating a healthy diet by: o  Eating less sugar and carbohydrates o  Increasing activity/exercise o  Talking with your doctor about reaching your blood sugar goals . High blood sugars (greater than 180 mg/dL) can raise your risk of infections and slow your recovery, so you will need to focus on controlling your diabetes during the weeks before surgery. . Make sure that the doctor who takes care of your diabetes knows about your planned surgery including the date and location.  How do I manage my blood sugar before surgery? . Check your blood sugar at least 4 times a day, starting 2 days before surgery, to make  sure that the level is not too high or low. . Check your blood sugar the morning of your surgery when you wake up and every 2 hours until you get to the Short Stay unit. o If your blood sugar is less than 70 mg/dL, you will need to treat for low blood sugar: - Treat a low blood sugar (less than 70 mg/dL) with  cup of clear juice (cranberry or apple), 4 glucose tablets, OR glucose gel. - Recheck blood sugar in 15 minutes after treatment (to make sure it is greater than 70 mg/dL). If your blood sugar is not greater than 70 mg/dL on recheck, call [phone] for further instructions. . Report your blood sugar to the short stay nurse when you get to Short Stay.  . If you are admitted to the hospital after surgery: o Your blood sugar will be checked by the staff and you will probably be given insulin after surgery (instead of oral diabetes medicines) to make sure you have good blood sugar levels. o The goal for blood sugar control after surgery is 80-180 mg/dL.   The Morning of Surgery:            Do not wear jewelry, make up, or nail polish.            Do not wear lotions, powders, perfumes, or deodorant.            Do not shave 48 hours prior to surgery.  Do not bring valuables to the hospital.            [facility] is not responsible for any belongings or valuables.  Do NOT Smoke (Tobacco/Vaping) or drink Alcohol 24 hours prior to your procedure.  If you use a CPAP at night, you may bring all equipment for your overnight stay.   Contacts, glasses, dentures or bridgework may not be worn into surgery, please bring cases for these belongings   For patients admitted to the hospital, discharge time will be determined by your treatment team.   Patients discharged the day of surgery will not be allowed to drive home, and someone needs to stay with them for 24 hours.    Special instructions:   [place]- Preparing For Surgery  Before surgery, you can play an important role.  Because skin is not sterile, your skin needs to be as free of germs as possible. You can reduce the number of germs on your skin by washing with CHG (chlorahexidine gluconate) Soap before surgery.  CHG is an antiseptic cleaner which kills germs and bonds with the skin to continue killing germs even after washing.    Oral Hygiene is also important to reduce your risk of infection.  Remember - BRUSH YOUR TEETH THE MORNING OF SURGERY WITH YOUR REGULAR TOOTHPASTE  Please do not use if you have an allergy to CHG or antibacterial soaps. If your skin becomes reddened/irritated stop using the CHG.  Do not shave (including legs and underarms) for at least 48 hours prior to first CHG shower. It is OK to shave your face.  Please follow these instructions carefully.   1. Shower the NIGHT BEFORE SURGERY and the MORNING OF SURGERY  2. If you chose to wash your hair, wash your hair first as usual with your normal shampoo.  3. After you shampoo, rinse your hair and body thoroughly to remove the shampoo.  4. Wash Face and genitals (private parts) with your normal soap.   5.  Shower the NIGHT BEFORE SURGERY and the MORNING OF SURGERY with CHG Soap.   6. Use CHG Soap as you would any other liquid soap. You can apply CHG directly to the skin and wash gently with a scrungie or a clean washcloth.   7. Apply the CHG Soap to your body ONLY FROM THE NECK DOWN.  Do not use on open wounds or open sores. Avoid contact with your eyes, ears, mouth and genitals (private parts). Wash Face and genitals (private parts)  with your normal soap.   8. Wash thoroughly, paying special attention to the area where your surgery will be performed.  9. Thoroughly rinse your body with warm water from the neck down.  10. DO NOT shower/wash with your normal soap after using and rinsing off the CHG Soap.  11. Pat yourself dry with a CLEAN TOWEL.  12. Wear CLEAN PAJAMAS to bed the night before surgery  13. Place CLEAN SHEETS on your  bed the night before your surgery  14. DO NOT SLEEP WITH PETS.   Day of Surgery: Wear Clean/Comfortable clothing the morning of surgery Do not apply any deodorants/lotions.   Remember to brush your teeth WITH YOUR REGULAR TOOTHPASTE.   Please read over the following fact sheets that you were given.

## 2020-05-19 ENCOUNTER — Other Ambulatory Visit: Payer: Self-pay

## 2020-05-19 ENCOUNTER — Encounter (HOSPITAL_COMMUNITY): Payer: Self-pay

## 2020-05-19 ENCOUNTER — Encounter (HOSPITAL_COMMUNITY)
Admission: RE | Admit: 2020-05-19 | Discharge: 2020-05-19 | Disposition: A | Discharge status: Home / Self Care | Payer: Medicare PPO | Source: Ambulatory Visit | Attending: Neurosurgery | Admitting: Neurosurgery

## 2020-05-19 DIAGNOSIS — E119 Type 2 diabetes mellitus without complications: Secondary | ICD-10-CM | POA: Diagnosis present

## 2020-05-19 DIAGNOSIS — Z7982 Long term (current) use of aspirin: Secondary | ICD-10-CM | POA: Diagnosis not present

## 2020-05-19 DIAGNOSIS — E785 Hyperlipidemia, unspecified: Secondary | ICD-10-CM | POA: Diagnosis present

## 2020-05-19 DIAGNOSIS — Z981 Arthrodesis status: Secondary | ICD-10-CM | POA: Diagnosis not present

## 2020-05-19 DIAGNOSIS — M5116 Intervertebral disc disorders with radiculopathy, lumbar region: Secondary | ICD-10-CM | POA: Diagnosis present

## 2020-05-19 DIAGNOSIS — Z20822 Contact with and (suspected) exposure to covid-19: Secondary | ICD-10-CM | POA: Insufficient documentation

## 2020-05-19 DIAGNOSIS — M21371 Foot drop, right foot: Secondary | ICD-10-CM | POA: Diagnosis present

## 2020-05-19 DIAGNOSIS — M4326 Fusion of spine, lumbar region: Secondary | ICD-10-CM | POA: Diagnosis not present

## 2020-05-19 DIAGNOSIS — M7130 Other bursal cyst, unspecified site: Secondary | ICD-10-CM | POA: Diagnosis present

## 2020-05-19 DIAGNOSIS — Z79899 Other long term (current) drug therapy: Secondary | ICD-10-CM | POA: Diagnosis not present

## 2020-05-19 DIAGNOSIS — M48061 Spinal stenosis, lumbar region without neurogenic claudication: Secondary | ICD-10-CM | POA: Diagnosis present

## 2020-05-19 DIAGNOSIS — Z833 Family history of diabetes mellitus: Secondary | ICD-10-CM | POA: Diagnosis not present

## 2020-05-19 DIAGNOSIS — I10 Essential (primary) hypertension: Secondary | ICD-10-CM | POA: Diagnosis present

## 2020-05-19 DIAGNOSIS — J4520 Mild intermittent asthma, uncomplicated: Secondary | ICD-10-CM | POA: Diagnosis not present

## 2020-05-19 DIAGNOSIS — Z888 Allergy status to other drugs, medicaments and biological substances status: Secondary | ICD-10-CM | POA: Diagnosis not present

## 2020-05-19 DIAGNOSIS — Z6838 Body mass index (BMI) 38.0-38.9, adult: Secondary | ICD-10-CM | POA: Diagnosis not present

## 2020-05-19 DIAGNOSIS — M5416 Radiculopathy, lumbar region: Secondary | ICD-10-CM | POA: Diagnosis not present

## 2020-05-19 DIAGNOSIS — Z01818 Encounter for other preprocedural examination: Secondary | ICD-10-CM | POA: Insufficient documentation

## 2020-05-19 DIAGNOSIS — M4316 Spondylolisthesis, lumbar region: Secondary | ICD-10-CM | POA: Diagnosis present

## 2020-05-19 DIAGNOSIS — Z886 Allergy status to analgesic agent status: Secondary | ICD-10-CM | POA: Diagnosis not present

## 2020-05-19 DIAGNOSIS — Z853 Personal history of malignant neoplasm of breast: Secondary | ICD-10-CM | POA: Diagnosis not present

## 2020-05-19 DIAGNOSIS — Z8249 Family history of ischemic heart disease and other diseases of the circulatory system: Secondary | ICD-10-CM | POA: Diagnosis not present

## 2020-05-19 DIAGNOSIS — Z7984 Long term (current) use of oral hypoglycemic drugs: Secondary | ICD-10-CM | POA: Diagnosis not present

## 2020-05-19 DIAGNOSIS — Z883 Allergy status to other anti-infective agents status: Secondary | ICD-10-CM | POA: Diagnosis not present

## 2020-05-19 DIAGNOSIS — Z803 Family history of malignant neoplasm of breast: Secondary | ICD-10-CM | POA: Diagnosis not present

## 2020-05-19 DIAGNOSIS — K219 Gastro-esophageal reflux disease without esophagitis: Secondary | ICD-10-CM | POA: Diagnosis present

## 2020-05-19 DIAGNOSIS — Z83438 Family history of other disorder of lipoprotein metabolism and other lipidemia: Secondary | ICD-10-CM | POA: Diagnosis not present

## 2020-05-19 HISTORY — DX: Unspecified asthma, uncomplicated: J45.909

## 2020-05-19 LAB — SURGICAL PCR SCREEN
MRSA, PCR: NEGATIVE
Staphylococcus aureus: POSITIVE — AB

## 2020-05-19 LAB — CBC
HCT: 40.7 % (ref 36.0–46.0)
Hemoglobin: 13.4 g/dL (ref 12.0–15.0)
MCH: 31 pg (ref 26.0–34.0)
MCHC: 32.9 g/dL (ref 30.0–36.0)
MCV: 94.2 fL (ref 80.0–100.0)
Platelets: 256 10*3/uL (ref 150–400)
RBC: 4.32 MIL/uL (ref 3.87–5.11)
RDW: 12.3 % (ref 11.5–15.5)
WBC: 8 10*3/uL (ref 4.0–10.5)
nRBC: 0 % (ref 0.0–0.2)

## 2020-05-19 LAB — TYPE AND SCREEN
ABO/RH(D): A POS
Antibody Screen: NEGATIVE

## 2020-05-19 LAB — BASIC METABOLIC PANEL
Anion gap: 10 (ref 5–15)
BUN: 11 mg/dL (ref 8–23)
CO2: 25 mmol/L (ref 22–32)
Calcium: 9.3 mg/dL (ref 8.9–10.3)
Chloride: 105 mmol/L (ref 98–111)
Creatinine, Ser: 0.62 mg/dL (ref 0.44–1.00)
GFR, Estimated: >60 mL/min (ref >60)
Glucose, Bld: 129 mg/dL — ABNORMAL HIGH (ref 70–99)
Potassium: 3 mmol/L — ABNORMAL LOW (ref 3.5–5.1)
Sodium: 140 mmol/L (ref 135–145)

## 2020-05-19 LAB — SARS CORONAVIRUS 2 (TAT 6-24 HRS): SARS Coronavirus 2: NEGATIVE

## 2020-05-19 LAB — GLUCOSE, CAPILLARY: Glucose-Capillary: 133 mg/dL — ABNORMAL HIGH (ref 70–99)

## 2020-05-19 NOTE — Progress Notes (Addendum)
PCP: Leotis Shames Cardiologist: Lyman Bishop sees for HTN  EKG: 05/19/20 CXR: na ECHO: denies Stress Test: denies Cardiac Cath: denies  Fasting Blood Sugar- 120-150 Checks Blood Sugar__1-2_ times a day  OSA/CPAP: No  ASA: Will call MD for instructions Blood Thinners: No  Covid test 3/28 pending  Anesthesia Review: No  Patient denies shortness of breath, fever, cough, and chest pain at PAT appointment.  Patient verbalized understanding of instructions provided today at the PAT appointment.  Patient asked to review instructions at home and day of surgery.

## 2020-05-20 NOTE — Anesthesia Preprocedure Evaluation (Addendum)
Anesthesia Evaluation  Patient identified by MRN, date of birth, ID band Patient awake    Reviewed: Allergy & Precautions, NPO status , Patient's Chart, lab work & pertinent test results  Airway Mallampati: II  TM Distance: >3 FB Neck ROM: Full    Dental  (+) Teeth Intact   Pulmonary asthma ,    Pulmonary exam normal        Cardiovascular hypertension, Pt. on medications  Rhythm:Regular Rate:Normal     Neuro/Psych  Headaches, negative psych ROS   GI/Hepatic Neg liver ROS, GERD  Medicated,  Endo/Other  diabetes, Type 2, Oral Hypoglycemic Agents  Renal/GU negative Renal ROS  negative genitourinary   Musculoskeletal  (+) Arthritis , Lumbar radiculopathy    Abdominal (+)  Abdomen: soft. Bowel sounds: normal.  Peds  Hematology  (+) anemia ,   Anesthesia Other Findings   Reproductive/Obstetrics                            Anesthesia Physical Anesthesia Plan  ASA: II  Anesthesia Plan: General   Post-op Pain Management:    Induction: Intravenous  PONV Risk Score and Plan: 3 and Ondansetron, Dexamethasone, Propofol infusion, Midazolam and Treatment may vary due to age or medical condition  Airway Management Planned: Mask and Oral ETT  Additional Equipment: Arterial line  Intra-op Plan:   Post-operative Plan: Possible Post-op intubation/ventilation  Informed Consent: I have reviewed the patients History and Physical, chart, labs and discussed the procedure including the risks, benefits and alternatives for the proposed anesthesia with the patient or authorized representative who has indicated his/her understanding and acceptance.     Dental advisory given  Plan Discussed with: CRNA  Anesthesia Plan Comments: (Lab Results      Component                Value               Date                      WBC                      8.0                 05/19/2020                HGB                       13.4                05/19/2020                HCT                      40.7                05/19/2020                MCV                      94.2                05/19/2020                PLT  256                 05/19/2020           Lab Results      Component                Value               Date                      NA                       140                 05/19/2020                K                        3.0 (L)             05/19/2020                CO2                      25                  05/19/2020                GLUCOSE                  129 (H)             05/19/2020                BUN                      11                  05/19/2020                CREATININE               0.62                05/19/2020                CALCIUM                  9.3                 05/19/2020                GFRNONAA                 >60                 05/19/2020                GFRAA                    >60                 09/01/2017          )       Anesthesia Quick Evaluation

## 2020-05-21 ENCOUNTER — Inpatient Hospital Stay (HOSPITAL_COMMUNITY): Payer: Medicare PPO | Admitting: Certified Registered Nurse Anesthetist

## 2020-05-21 ENCOUNTER — Inpatient Hospital Stay (HOSPITAL_COMMUNITY)
Admission: RE | Admit: 2020-05-21 | Discharge: 2020-05-26 | DRG: 455 | Disposition: A | Discharge status: Home Health | Payer: Medicare PPO | Attending: Neurosurgery | Admitting: Neurosurgery

## 2020-05-21 ENCOUNTER — Inpatient Hospital Stay (HOSPITAL_COMMUNITY): Payer: Medicare PPO

## 2020-05-21 ENCOUNTER — Encounter (HOSPITAL_COMMUNITY)
Admission: RE | Disposition: A | Discharge status: Home Health | Payer: Self-pay | Source: Home / Self Care | Attending: Neurosurgery

## 2020-05-21 ENCOUNTER — Encounter (HOSPITAL_COMMUNITY): Payer: Self-pay

## 2020-05-21 ENCOUNTER — Other Ambulatory Visit: Payer: Self-pay

## 2020-05-21 DIAGNOSIS — Z20822 Contact with and (suspected) exposure to covid-19: Secondary | ICD-10-CM | POA: Diagnosis present

## 2020-05-21 DIAGNOSIS — M21371 Foot drop, right foot: Secondary | ICD-10-CM | POA: Diagnosis present

## 2020-05-21 DIAGNOSIS — M4316 Spondylolisthesis, lumbar region: Secondary | ICD-10-CM | POA: Diagnosis present

## 2020-05-21 DIAGNOSIS — Z6838 Body mass index (BMI) 38.0-38.9, adult: Secondary | ICD-10-CM | POA: Diagnosis not present

## 2020-05-21 DIAGNOSIS — Z833 Family history of diabetes mellitus: Secondary | ICD-10-CM

## 2020-05-21 DIAGNOSIS — K219 Gastro-esophageal reflux disease without esophagitis: Secondary | ICD-10-CM | POA: Diagnosis present

## 2020-05-21 DIAGNOSIS — Z7982 Long term (current) use of aspirin: Secondary | ICD-10-CM

## 2020-05-21 DIAGNOSIS — I10 Essential (primary) hypertension: Secondary | ICD-10-CM | POA: Diagnosis present

## 2020-05-21 DIAGNOSIS — Z886 Allergy status to analgesic agent status: Secondary | ICD-10-CM | POA: Diagnosis not present

## 2020-05-21 DIAGNOSIS — Z888 Allergy status to other drugs, medicaments and biological substances status: Secondary | ICD-10-CM | POA: Diagnosis not present

## 2020-05-21 DIAGNOSIS — M7130 Other bursal cyst, unspecified site: Secondary | ICD-10-CM | POA: Diagnosis present

## 2020-05-21 DIAGNOSIS — E119 Type 2 diabetes mellitus without complications: Secondary | ICD-10-CM | POA: Diagnosis present

## 2020-05-21 DIAGNOSIS — Z7984 Long term (current) use of oral hypoglycemic drugs: Secondary | ICD-10-CM

## 2020-05-21 DIAGNOSIS — M48061 Spinal stenosis, lumbar region without neurogenic claudication: Secondary | ICD-10-CM | POA: Diagnosis present

## 2020-05-21 DIAGNOSIS — Z981 Arthrodesis status: Secondary | ICD-10-CM | POA: Diagnosis not present

## 2020-05-21 DIAGNOSIS — Z803 Family history of malignant neoplasm of breast: Secondary | ICD-10-CM

## 2020-05-21 DIAGNOSIS — M5116 Intervertebral disc disorders with radiculopathy, lumbar region: Secondary | ICD-10-CM | POA: Diagnosis present

## 2020-05-21 DIAGNOSIS — Z8249 Family history of ischemic heart disease and other diseases of the circulatory system: Secondary | ICD-10-CM | POA: Diagnosis not present

## 2020-05-21 DIAGNOSIS — M4326 Fusion of spine, lumbar region: Secondary | ICD-10-CM | POA: Diagnosis not present

## 2020-05-21 DIAGNOSIS — Z83438 Family history of other disorder of lipoprotein metabolism and other lipidemia: Secondary | ICD-10-CM | POA: Diagnosis not present

## 2020-05-21 DIAGNOSIS — Z853 Personal history of malignant neoplasm of breast: Secondary | ICD-10-CM

## 2020-05-21 DIAGNOSIS — Z419 Encounter for procedure for purposes other than remedying health state, unspecified: Secondary | ICD-10-CM

## 2020-05-21 DIAGNOSIS — M5416 Radiculopathy, lumbar region: Secondary | ICD-10-CM | POA: Diagnosis present

## 2020-05-21 DIAGNOSIS — Z79899 Other long term (current) drug therapy: Secondary | ICD-10-CM | POA: Diagnosis not present

## 2020-05-21 DIAGNOSIS — Z883 Allergy status to other anti-infective agents status: Secondary | ICD-10-CM | POA: Diagnosis not present

## 2020-05-21 DIAGNOSIS — E785 Hyperlipidemia, unspecified: Secondary | ICD-10-CM | POA: Diagnosis present

## 2020-05-21 HISTORY — PX: TRANSFORAMINAL LUMBAR INTERBODY FUSION W/ MIS 2 LEVEL: SHX6146

## 2020-05-21 HISTORY — PX: APPLICATION OF ROBOTIC ASSISTANCE FOR SPINAL PROCEDURE: SHX6753

## 2020-05-21 HISTORY — PX: ANTERIOR LAT LUMBAR FUSION: SHX1168

## 2020-05-21 LAB — POCT I-STAT, CHEM 8
BUN: 10 mg/dL (ref 8–23)
BUN: 9 mg/dL (ref 8–23)
Calcium, Ion: 1.18 mmol/L (ref 1.15–1.40)
Calcium, Ion: 1.15 mmol/L (ref 1.15–1.40)
Chloride: 102 mmol/L (ref 98–111)
Chloride: 103 mmol/L (ref 98–111)
Creatinine, Ser: 0.3 mg/dL — ABNORMAL LOW (ref 0.44–1.00)
Creatinine, Ser: 0.3 mg/dL — ABNORMAL LOW (ref 0.44–1.00)
Glucose, Bld: 214 mg/dL — ABNORMAL HIGH (ref 70–99)
Glucose, Bld: 186 mg/dL — ABNORMAL HIGH (ref 70–99)
HCT: 35 % — ABNORMAL LOW (ref 36.0–46.0)
HCT: 35 % — ABNORMAL LOW (ref 36.0–46.0)
Hemoglobin: 11.9 g/dL — ABNORMAL LOW (ref 12.0–15.0)
Hemoglobin: 11.9 g/dL — ABNORMAL LOW (ref 12.0–15.0)
Potassium: 3 mmol/L — ABNORMAL LOW (ref 3.5–5.1)
Potassium: 3.3 mmol/L — ABNORMAL LOW (ref 3.5–5.1)
Sodium: 139 mmol/L (ref 135–145)
Sodium: 140 mmol/L (ref 135–145)
TCO2: 27 mmol/L (ref 22–32)
TCO2: 26 mmol/L (ref 22–32)

## 2020-05-21 LAB — POCT I-STAT 7, (LYTES, BLD GAS, ICA,H+H)
Acid-Base Excess: 4 mmol/L — ABNORMAL HIGH (ref 0.0–2.0)
Acid-Base Excess: 2 mmol/L (ref 0.0–2.0)
Acid-Base Excess: 0 mmol/L (ref 0.0–2.0)
Bicarbonate: 27.8 mmol/L (ref 20.0–28.0)
Bicarbonate: 26.6 mmol/L (ref 20.0–28.0)
Bicarbonate: 23.8 mmol/L (ref 20.0–28.0)
Calcium, Ion: 1.18 mmol/L (ref 1.15–1.40)
Calcium, Ion: 1.14 mmol/L — ABNORMAL LOW (ref 1.15–1.40)
Calcium, Ion: 1.13 mmol/L — ABNORMAL LOW (ref 1.15–1.40)
HCT: 37 % (ref 36.0–46.0)
HCT: 34 % — ABNORMAL LOW (ref 36.0–46.0)
HCT: 29 % — ABNORMAL LOW (ref 36.0–46.0)
Hemoglobin: 12.6 g/dL (ref 12.0–15.0)
Hemoglobin: 11.6 g/dL — ABNORMAL LOW (ref 12.0–15.0)
Hemoglobin: 9.9 g/dL — ABNORMAL LOW (ref 12.0–15.0)
O2 Saturation: 100 %
O2 Saturation: 100 %
O2 Saturation: 100 %
Potassium: 2.8 mmol/L — ABNORMAL LOW (ref 3.5–5.1)
Potassium: 3.1 mmol/L — ABNORMAL LOW (ref 3.5–5.1)
Potassium: 3.3 mmol/L — ABNORMAL LOW (ref 3.5–5.1)
Sodium: 139 mmol/L (ref 135–145)
Sodium: 138 mmol/L (ref 135–145)
Sodium: 140 mmol/L (ref 135–145)
TCO2: 29 mmol/L (ref 22–32)
TCO2: 28 mmol/L (ref 22–32)
TCO2: 25 mmol/L (ref 22–32)
pCO2 arterial: 39.6 mmHg (ref 32.0–48.0)
pCO2 arterial: 40.6 mmHg (ref 32.0–48.0)
pCO2 arterial: 36.3 mmHg (ref 32.0–48.0)
pH, Arterial: 7.455 — ABNORMAL HIGH (ref 7.350–7.450)
pH, Arterial: 7.424 (ref 7.350–7.450)
pH, Arterial: 7.424 (ref 7.350–7.450)
pO2, Arterial: 298 mmHg — ABNORMAL HIGH (ref 83.0–108.0)
pO2, Arterial: 297 mmHg — ABNORMAL HIGH (ref 83.0–108.0)
pO2, Arterial: 292 mmHg — ABNORMAL HIGH (ref 83.0–108.0)

## 2020-05-21 LAB — ABO/RH: ABO/RH(D): A POS

## 2020-05-21 LAB — GLUCOSE, CAPILLARY
Glucose-Capillary: 166 mg/dL — ABNORMAL HIGH (ref 70–99)
Glucose-Capillary: 197 mg/dL — ABNORMAL HIGH (ref 70–99)
Glucose-Capillary: 200 mg/dL — ABNORMAL HIGH (ref 70–99)

## 2020-05-21 SURGERY — ANTERIOR LATERAL LUMBAR FUSION 2 LEVELS
Anesthesia: General | Site: Flank | Laterality: Right

## 2020-05-21 MED ORDER — INSULIN ASPART 100 UNIT/ML ~~LOC~~ SOLN
SUBCUTANEOUS | Status: DC | PRN
Start: 2020-05-21 — End: 2020-05-21
  Administered 2020-05-21: 5 [IU] via INTRAVENOUS

## 2020-05-21 MED ORDER — CHLORHEXIDINE GLUCONATE CLOTH 2 % EX PADS: 6.0000 | MEDICATED_PAD | Freq: Once | CUTANEOUS | Status: DC

## 2020-05-21 MED ORDER — LIDOCAINE-EPINEPHRINE 1 %-1:100000 IJ SOLN
INTRAMUSCULAR | Status: AC
  Filled 2020-05-21: qty 1

## 2020-05-21 MED ORDER — ENOXAPARIN SODIUM 40 MG/0.4ML ~~LOC~~ SOLN
40.0000 mg | SUBCUTANEOUS | Status: DC
  Administered 2020-05-23 – 2020-05-26 (×4): 40 mg via SUBCUTANEOUS
  Filled 2020-05-21 (×4): qty 0.4

## 2020-05-21 MED ORDER — ALBUMIN HUMAN 5 % IV SOLN
INTRAVENOUS | Status: DC | PRN
Start: 2020-05-21 — End: 2020-05-21

## 2020-05-21 MED ORDER — ONDANSETRON HCL 4 MG/2ML IJ SOLN
INTRAMUSCULAR | Status: AC
  Filled 2020-05-21: qty 2

## 2020-05-21 MED ORDER — FENTANYL CITRATE (PF) 100 MCG/2ML IJ SOLN
25.0000 ug | INTRAMUSCULAR | Status: DC | PRN
  Administered 2020-05-21: 25 ug via INTRAVENOUS

## 2020-05-21 MED ORDER — CALCIUM CARBONATE 1250 (500 CA) MG PO TABS
1.0000 | ORAL_TABLET | Freq: Every day | ORAL | Status: DC
  Administered 2020-05-22 – 2020-05-26 (×5): 500 mg via ORAL
  Filled 2020-05-21 (×7): qty 1

## 2020-05-21 MED ORDER — LIDOCAINE 2% (20 MG/ML) 5 ML SYRINGE
INTRAMUSCULAR | Status: AC
  Filled 2020-05-21: qty 5

## 2020-05-21 MED ORDER — LACTATED RINGERS IV SOLN
INTRAVENOUS | Status: DC | PRN
Start: 2020-05-21 — End: 2020-05-21

## 2020-05-21 MED ORDER — PROPOFOL 500 MG/50ML IV EMUL
INTRAVENOUS | Status: DC | PRN
  Administered 2020-05-21: 40 ug/kg/min via INTRAVENOUS

## 2020-05-21 MED ORDER — POTASSIUM CHLORIDE CRYS ER 20 MEQ PO TBCR
20.0000 meq | EXTENDED_RELEASE_TABLET | Freq: Two times a day (BID) | ORAL | Status: DC
  Administered 2020-05-21 – 2020-05-26 (×10): 20 meq via ORAL
  Filled 2020-05-21 (×10): qty 1

## 2020-05-21 MED ORDER — METFORMIN HCL ER 500 MG PO TB24
500.0000 mg | ORAL_TABLET | Freq: Two times a day (BID) | ORAL | Status: DC
  Administered 2020-05-22 – 2020-05-26 (×8): 500 mg via ORAL
  Filled 2020-05-21 (×10): qty 1

## 2020-05-21 MED ORDER — LIDOCAINE 2% (20 MG/ML) 5 ML SYRINGE
INTRAMUSCULAR | Status: DC | PRN
  Administered 2020-05-21: 60 mg via INTRAVENOUS

## 2020-05-21 MED ORDER — DOCUSATE SODIUM 100 MG PO CAPS
100.0000 mg | ORAL_CAPSULE | Freq: Two times a day (BID) | ORAL | Status: DC
  Administered 2020-05-21 – 2020-05-26 (×10): 100 mg via ORAL
  Filled 2020-05-21 (×10): qty 1

## 2020-05-21 MED ORDER — ADULT MULTIVITAMIN W/MINERALS CH
1.0000 | ORAL_TABLET | Freq: Every day | ORAL | Status: DC
  Filled 2020-05-21: qty 1

## 2020-05-21 MED ORDER — POLYETHYLENE GLYCOL 3350 17 G PO PACK
17.0000 g | PACK | Freq: Every day | ORAL | Status: DC | PRN
  Administered 2020-05-26: 17 g via ORAL
  Filled 2020-05-21: qty 1

## 2020-05-21 MED ORDER — LACTATED RINGERS IV SOLN: INTRAVENOUS | Status: DC

## 2020-05-21 MED ORDER — OXYCODONE HCL 5 MG/5ML PO SOLN: 5.0000 mg | Freq: Once | ORAL | Status: DC | PRN

## 2020-05-21 MED ORDER — EPHEDRINE 5 MG/ML INJ
INTRAVENOUS | Status: AC
  Filled 2020-05-21: qty 10

## 2020-05-21 MED ORDER — SUCCINYLCHOLINE CHLORIDE 200 MG/10ML IV SOSY
PREFILLED_SYRINGE | INTRAVENOUS | Status: AC
  Filled 2020-05-21: qty 10

## 2020-05-21 MED ORDER — PROPOFOL 10 MG/ML IV BOLUS
INTRAVENOUS | Status: DC | PRN
  Administered 2020-05-21: 150 mg via INTRAVENOUS
  Administered 2020-05-21: 50 mg via INTRAVENOUS

## 2020-05-21 MED ORDER — PROMETHAZINE HCL 25 MG/ML IJ SOLN: 6.2500 mg | INTRAMUSCULAR | Status: DC | PRN

## 2020-05-21 MED ORDER — SUFENTANIL CITRATE 250 MCG/5ML IV SOLN
0.2500 ug/kg/h | Freq: Once | INTRAVENOUS | Status: AC
  Administered 2020-05-21: .2 ug/kg/h via INTRAVENOUS
  Filled 2020-05-21: qty 5

## 2020-05-21 MED ORDER — FENTANYL CITRATE (PF) 100 MCG/2ML IJ SOLN
INTRAMUSCULAR | Status: AC
  Administered 2020-05-21: 25 ug via INTRAVENOUS
  Filled 2020-05-21: qty 2

## 2020-05-21 MED ORDER — THROMBIN 5000 UNITS EX SOLR
CUTANEOUS | Status: AC
  Filled 2020-05-21: qty 15000

## 2020-05-21 MED ORDER — CALCIUM 500 MG PO TABS: 1000.0000 mg | ORAL_TABLET | Freq: Every day | ORAL | Status: DC

## 2020-05-21 MED ORDER — ADULT MULTIVITAMIN W/MINERALS CH
1.0000 | ORAL_TABLET | Freq: Every day | ORAL | Status: DC
  Administered 2020-05-21 – 2020-05-26 (×6): 1 via ORAL
  Filled 2020-05-21 (×6): qty 1

## 2020-05-21 MED ORDER — PHENOL 1.4 % MT LIQD: 1.0000 | OROMUCOSAL | Status: DC | PRN

## 2020-05-21 MED ORDER — ONDANSETRON HCL 4 MG PO TABS: 4.0000 mg | ORAL_TABLET | Freq: Four times a day (QID) | ORAL | Status: DC | PRN

## 2020-05-21 MED ORDER — PHENYLEPHRINE HCL-NACL 10-0.9 MG/250ML-% IV SOLN
INTRAVENOUS | Status: DC | PRN
  Administered 2020-05-21 (×2): 40 ug/min via INTRAVENOUS
  Administered 2020-05-21: 60 ug/min via INTRAVENOUS

## 2020-05-21 MED ORDER — BUPIVACAINE LIPOSOME 1.3 % IJ SUSP
INTRAMUSCULAR | Status: AC
  Filled 2020-05-21: qty 20

## 2020-05-21 MED ORDER — DEXAMETHASONE SODIUM PHOSPHATE 10 MG/ML IJ SOLN
INTRAMUSCULAR | Status: AC
  Filled 2020-05-21: qty 1

## 2020-05-21 MED ORDER — LORATADINE 10 MG PO TABS
10.0000 mg | ORAL_TABLET | Freq: Every day | ORAL | Status: DC
  Administered 2020-05-21 – 2020-05-26 (×6): 10 mg via ORAL
  Filled 2020-05-21 (×7): qty 1

## 2020-05-21 MED ORDER — SODIUM CHLORIDE 0.9% FLUSH: 3.0000 mL | INTRAVENOUS | Status: DC | PRN

## 2020-05-21 MED ORDER — THROMBIN 5000 UNITS EX SOLR: OROMUCOSAL | Status: DC | PRN

## 2020-05-21 MED ORDER — SODIUM CHLORIDE 0.9 % IV SOLN: 250.0000 mL | INTRAVENOUS | Status: DC

## 2020-05-21 MED ORDER — ONDANSETRON HCL 4 MG/2ML IJ SOLN
INTRAMUSCULAR | Status: DC | PRN
  Administered 2020-05-21: 4 mg via INTRAVENOUS

## 2020-05-21 MED ORDER — BUPIVACAINE HCL (PF) 0.5 % IJ SOLN
INTRAMUSCULAR | Status: AC
  Filled 2020-05-21: qty 30

## 2020-05-21 MED ORDER — ONDANSETRON HCL 4 MG/2ML IJ SOLN
4.0000 mg | Freq: Four times a day (QID) | INTRAMUSCULAR | Status: DC | PRN
  Administered 2020-05-22: 4 mg via INTRAVENOUS
  Filled 2020-05-21: qty 2

## 2020-05-21 MED ORDER — MIDAZOLAM HCL 2 MG/2ML IJ SOLN
INTRAMUSCULAR | Status: AC
  Filled 2020-05-21: qty 2

## 2020-05-21 MED ORDER — FENTANYL CITRATE (PF) 250 MCG/5ML IJ SOLN
INTRAMUSCULAR | Status: AC
  Filled 2020-05-21: qty 5

## 2020-05-21 MED ORDER — ACETAMINOPHEN 10 MG/ML IV SOLN
INTRAVENOUS | Status: AC
  Filled 2020-05-21: qty 100

## 2020-05-21 MED ORDER — ACETAMINOPHEN 325 MG PO TABS: 650.0000 mg | ORAL_TABLET | ORAL | Status: DC | PRN

## 2020-05-21 MED ORDER — KETAMINE HCL 50 MG/5ML IJ SOSY
PREFILLED_SYRINGE | INTRAMUSCULAR | Status: AC
  Filled 2020-05-21: qty 5

## 2020-05-21 MED ORDER — CHLORHEXIDINE GLUCONATE 0.12 % MT SOLN
15.0000 mL | Freq: Once | OROMUCOSAL | Status: AC
  Administered 2020-05-21: 15 mL via OROMUCOSAL
  Filled 2020-05-21: qty 15

## 2020-05-21 MED ORDER — EPINEPHRINE 0.3 MG/0.3ML IJ SOAJ
0.3000 mg | INTRAMUSCULAR | Status: DC | PRN
  Filled 2020-05-21: qty 0.6

## 2020-05-21 MED ORDER — POTASSIUM CHLORIDE 10 MEQ/100ML IV SOLN
INTRAVENOUS | Status: DC | PRN
  Administered 2020-05-21 (×4): 10 meq via INTRAVENOUS

## 2020-05-21 MED ORDER — PHENYLEPHRINE 40 MCG/ML (10ML) SYRINGE FOR IV PUSH (FOR BLOOD PRESSURE SUPPORT)
PREFILLED_SYRINGE | INTRAVENOUS | Status: AC
  Filled 2020-05-21: qty 10

## 2020-05-21 MED ORDER — OXYCODONE-ACETAMINOPHEN 5-325 MG PO TABS
1.0000 | ORAL_TABLET | Freq: Four times a day (QID) | ORAL | Status: DC | PRN
  Administered 2020-05-22: 2 via ORAL
  Administered 2020-05-23: 1 via ORAL
  Administered 2020-05-23 – 2020-05-26 (×11): 2 via ORAL
  Filled 2020-05-21 (×13): qty 2

## 2020-05-21 MED ORDER — 0.9 % SODIUM CHLORIDE (POUR BTL) OPTIME
TOPICAL | Status: DC | PRN
  Administered 2020-05-21: 1000 mL

## 2020-05-21 MED ORDER — SUCCINYLCHOLINE CHLORIDE 200 MG/10ML IV SOSY
PREFILLED_SYRINGE | INTRAVENOUS | Status: DC | PRN
Start: 2020-05-21 — End: 2020-05-21
  Administered 2020-05-21: 100 mg via INTRAVENOUS

## 2020-05-21 MED ORDER — MELATONIN 5 MG PO TABS
10.0000 mg | ORAL_TABLET | Freq: Every evening | ORAL | Status: DC | PRN
Start: 2020-05-21 — End: 2020-05-26
  Administered 2020-05-21 – 2020-05-25 (×3): 10 mg via ORAL
  Filled 2020-05-21 (×4): qty 2

## 2020-05-21 MED ORDER — CYCLOBENZAPRINE HCL 10 MG PO TABS
ORAL_TABLET | ORAL | Status: AC
  Administered 2020-05-21: 10 mg via ORAL
  Filled 2020-05-21: qty 1

## 2020-05-21 MED ORDER — ORAL CARE MOUTH RINSE: 15.0000 mL | Freq: Once | OROMUCOSAL | Status: AC

## 2020-05-21 MED ORDER — MIDAZOLAM HCL 5 MG/5ML IJ SOLN
INTRAMUSCULAR | Status: DC | PRN
  Administered 2020-05-21: 2 mg via INTRAVENOUS

## 2020-05-21 MED ORDER — LIDOCAINE-EPINEPHRINE 1 %-1:100000 IJ SOLN
INTRAMUSCULAR | Status: DC | PRN
  Administered 2020-05-21 (×2): 10 mL

## 2020-05-21 MED ORDER — FENTANYL CITRATE (PF) 250 MCG/5ML IJ SOLN
INTRAMUSCULAR | Status: DC | PRN
  Administered 2020-05-21: 100 ug via INTRAVENOUS

## 2020-05-21 MED ORDER — AMISULPRIDE (ANTIEMETIC) 5 MG/2ML IV SOLN: 10.0000 mg | Freq: Once | INTRAVENOUS | Status: DC | PRN

## 2020-05-21 MED ORDER — SIMETHICONE 80 MG PO CHEW
80.0000 mg | CHEWABLE_TABLET | Freq: Every day | ORAL | Status: DC | PRN
  Administered 2020-05-22: 80 mg via ORAL
  Filled 2020-05-21: qty 1

## 2020-05-21 MED ORDER — DEXAMETHASONE SODIUM PHOSPHATE 10 MG/ML IJ SOLN
INTRAMUSCULAR | Status: DC | PRN
  Administered 2020-05-21: 10 mg via INTRAVENOUS

## 2020-05-21 MED ORDER — ACETAMINOPHEN 10 MG/ML IV SOLN: 1000.0000 mg | Freq: Once | INTRAVENOUS | Status: DC | PRN

## 2020-05-21 MED ORDER — ROCURONIUM BROMIDE 10 MG/ML (PF) SYRINGE
PREFILLED_SYRINGE | INTRAVENOUS | Status: AC
  Filled 2020-05-21: qty 10

## 2020-05-21 MED ORDER — OXYCODONE HCL 5 MG PO TABS: 5.0000 mg | ORAL_TABLET | Freq: Once | ORAL | Status: DC | PRN

## 2020-05-21 MED ORDER — FLEET ENEMA 7-19 GM/118ML RE ENEM: 1.0000 | ENEMA | Freq: Once | RECTAL | Status: DC | PRN

## 2020-05-21 MED ORDER — CEFAZOLIN SODIUM-DEXTROSE 2-4 GM/100ML-% IV SOLN
2.0000 g | Freq: Three times a day (TID) | INTRAVENOUS | Status: AC
  Administered 2020-05-21 – 2020-05-22 (×3): 2 g via INTRAVENOUS
  Filled 2020-05-21 (×4): qty 100

## 2020-05-21 MED ORDER — SODIUM CHLORIDE 0.9% FLUSH
3.0000 mL | Freq: Two times a day (BID) | INTRAVENOUS | Status: DC
  Administered 2020-05-21 – 2020-05-25 (×8): 3 mL via INTRAVENOUS

## 2020-05-21 MED ORDER — ACETAMINOPHEN 650 MG RE SUPP: 650.0000 mg | RECTAL | Status: DC | PRN

## 2020-05-21 MED ORDER — PROPOFOL 10 MG/ML IV BOLUS
INTRAVENOUS | Status: AC
  Filled 2020-05-21: qty 40

## 2020-05-21 MED ORDER — CYCLOBENZAPRINE HCL 10 MG PO TABS
10.0000 mg | ORAL_TABLET | Freq: Three times a day (TID) | ORAL | Status: DC | PRN
  Administered 2020-05-22 – 2020-05-23 (×2): 10 mg via ORAL
  Filled 2020-05-21 (×2): qty 1

## 2020-05-21 MED ORDER — MENTHOL 3 MG MT LOZG: 1.0000 | LOZENGE | OROMUCOSAL | Status: DC | PRN

## 2020-05-21 MED ORDER — MORPHINE SULFATE (PF) 2 MG/ML IV SOLN
2.0000 mg | INTRAVENOUS | Status: DC | PRN
Start: 2020-05-21 — End: 2020-05-26
  Administered 2020-05-21 – 2020-05-22 (×4): 2 mg via INTRAVENOUS
  Filled 2020-05-21 (×4): qty 1

## 2020-05-21 MED ORDER — PROPOFOL 1000 MG/100ML IV EMUL
INTRAVENOUS | Status: AC
  Filled 2020-05-21: qty 300

## 2020-05-21 MED ORDER — CEFAZOLIN SODIUM-DEXTROSE 2-4 GM/100ML-% IV SOLN
2.0000 g | INTRAVENOUS | Status: AC
  Administered 2020-05-21 (×3): 2 g via INTRAVENOUS
  Filled 2020-05-21: qty 100

## 2020-05-21 MED ORDER — ACETAMINOPHEN 10 MG/ML IV SOLN
INTRAVENOUS | Status: DC | PRN
  Administered 2020-05-21: 1000 mg via INTRAVENOUS

## 2020-05-21 MED ORDER — ROCURONIUM BROMIDE 10 MG/ML (PF) SYRINGE
PREFILLED_SYRINGE | INTRAVENOUS | Status: DC | PRN
  Administered 2020-05-21: 30 mg via INTRAVENOUS
  Administered 2020-05-21: 20 mg via INTRAVENOUS
  Administered 2020-05-21 (×2): 30 mg via INTRAVENOUS
  Administered 2020-05-21: 50 mg via INTRAVENOUS

## 2020-05-21 MED ORDER — POTASSIUM CHLORIDE IN NACL 20-0.9 MEQ/L-% IV SOLN
INTRAVENOUS | Status: DC
  Filled 2020-05-21 (×2): qty 1000

## 2020-05-21 SURGICAL SUPPLY — 73 items
ADH SKN CLS APL DERMABOND .7 (GAUZE/BANDAGES/DRESSINGS) ×6
BATTALION LLIF ITRADISCAL SHIM (MISCELLANEOUS) ×4
BIT DRILL LONG 3.0X30 (BIT) ×1 IMPLANT
BLADE CLIPPER SURG (BLADE) IMPLANT
BUR 2.5 MTCH HD 16 (BUR) ×1 IMPLANT
CAGE INTERBODY LONG 7X29X24 (Cage) ×1 IMPLANT
CAGE INTERBODY PL LG 7X26.5X24 (Cage) ×1 IMPLANT
CLIP SPRING STIM LLIF SAFEOP (CLIP) ×1 IMPLANT
COVER WAND RF STERILE (DRAPES) ×3 IMPLANT
DECANTER SPIKE VIAL GLASS SM (MISCELLANEOUS) ×4 IMPLANT
DERMABOND ADVANCED (GAUZE/BANDAGES/DRESSINGS) ×2
DERMABOND ADVANCED .7 DNX12 (GAUZE/BANDAGES/DRESSINGS) ×3 IMPLANT
DILATOR INSULATED LLIF 8-13-18 (NEUROSURGERY SUPPLIES) ×1 IMPLANT
DISSECTOR BLUNT TIP ENDO 5MM (MISCELLANEOUS) IMPLANT
DRAPE C-ARM 42X72 X-RAY (DRAPES) ×5 IMPLANT
DRAPE C-ARMOR (DRAPES) ×5 IMPLANT
DRAPE LAPAROTOMY 100X72X124 (DRAPES) ×5 IMPLANT
DRSG OPSITE POSTOP 4X8 (GAUZE/BANDAGES/DRESSINGS) ×2 IMPLANT
DURAPREP 26ML APPLICATOR (WOUND CARE) ×5 IMPLANT
ELECT KIT SAFEOP SSEP/SURF (KITS) ×4
ELECT REM PT RETURN 9FT ADLT (ELECTROSURGICAL) ×8
ELECTRODE KT SAFEOP SSEP/SURF (KITS) IMPLANT
ELECTRODE REM PT RTRN 9FT ADLT (ELECTROSURGICAL) ×3 IMPLANT
GAUZE 4X4 16PLY RFD (DISPOSABLE) ×1 IMPLANT
GLOVE BIOGEL PI IND STRL 7.5 (GLOVE) ×3 IMPLANT
GLOVE BIOGEL PI INDICATOR 7.5 (GLOVE) ×2
GLOVE ECLIPSE 7.5 STRL STRAW (GLOVE) ×5 IMPLANT
GLOVE EXAM NITRILE XL STR (GLOVE) IMPLANT
GLOVE SURG POLYISO LF SZ6 (GLOVE) ×5 IMPLANT
GLOVE SURG UNDER POLY LF SZ6.5 (GLOVE) ×3 IMPLANT
GLOVE SURG UNDER POLY LF SZ7.5 (GLOVE) ×3 IMPLANT
GOWN STRL REUS W/ TWL LRG LVL3 (GOWN DISPOSABLE) IMPLANT
GOWN STRL REUS W/ TWL XL LVL3 (GOWN DISPOSABLE) ×6 IMPLANT
GOWN STRL REUS W/TWL 2XL LVL3 (GOWN DISPOSABLE) IMPLANT
GOWN STRL REUS W/TWL LRG LVL3 (GOWN DISPOSABLE) ×16
GOWN STRL REUS W/TWL XL LVL3 (GOWN DISPOSABLE) ×16
GUIDEWIRE TROC TIP NIT 25 (WIRE) ×10 IMPLANT
KIT BASIN OR (CUSTOM PROCEDURE TRAY) ×5 IMPLANT
KIT INFUSE X SMALL 1.4CC (Orthopedic Implant) ×1 IMPLANT
KIT SPINE MAZOR X ROBO DISP (MISCELLANEOUS) ×1 IMPLANT
KIT TURNOVER KIT B (KITS) ×4 IMPLANT
KNIFE ANNULOTOMY GREY RETRACT (ORTHOPEDIC DISPOSABLE SUPPLIES) ×1 IMPLANT
LIF ILLUMINATION SYSTEM STERIL (SYSTAGENIX WOUND MANAGEMENT) ×4
NEEDLE HYPO 22GX1.5 SAFETY (NEEDLE) ×5 IMPLANT
NS IRRIG 1000ML POUR BTL (IV SOLUTION) ×4 IMPLANT
PACK LAMINECTOMY NEURO (CUSTOM PROCEDURE TRAY) ×5 IMPLANT
PROBE BALL TIP LLIF SAFEOP (NEUROSURGERY SUPPLIES) ×1 IMPLANT
PUTTY DBF 6CC CORTICAL FIBERS (Putty) ×1 IMPLANT
PUTTY GRAFTON DBF 6CC W/DELIVE (Putty) ×2 IMPLANT
ROD LORD LIP 5.5X130 (Rod) ×2 IMPLANT
SCREW CANN EXT 7.5X40 (Screw) ×2 IMPLANT
SCREW CANN MIS PA 6.5X45 (Screw) ×4 IMPLANT
SCREW CANN PA INVICTUS 5.5X45 (Screw) ×4 IMPLANT
SCREW SCHANZ SA 4.0MM (MISCELLANEOUS) ×1 IMPLANT
SET SCREW (Screw) ×40 IMPLANT
SET SCREW SPNE (Screw) IMPLANT
SHIM ITRADISCAL BATTALION LLIF (MISCELLANEOUS) IMPLANT
SPACER IDENTITI LIF 8X18X50 10 (Spacer) ×1 IMPLANT
SPACER LIF IDENTI 10X22X50 10D (Spacer) ×1 IMPLANT
SPONGE LAP 4X18 RFD (DISPOSABLE) IMPLANT
SPONGE SURGIFOAM ABS GEL SZ50 (HEMOSTASIS) IMPLANT
SPONGE TONSIL TAPE 1 RFD (DISPOSABLE) IMPLANT
STAPLER VISISTAT 35W (STAPLE) ×3 IMPLANT
SUT MNCRL AB 4-0 PS2 18 (SUTURE) ×5 IMPLANT
SUT VIC AB 0 CT1 18XCR BRD8 (SUTURE) ×3 IMPLANT
SUT VIC AB 0 CT1 8-18 (SUTURE) ×12
SUT VIC AB 2-0 CP2 18 (SUTURE) ×7 IMPLANT
SYSTEM ILLUMINATION LIF STERIL (SYSTAGENIX WOUND MANAGEMENT) IMPLANT
TOWEL GREEN STERILE (TOWEL DISPOSABLE) ×5 IMPLANT
TOWEL GREEN STERILE FF (TOWEL DISPOSABLE) ×4 IMPLANT
TRAY FOLEY MTR SLVR 16FR STAT (SET/KITS/TRAYS/PACK) ×4 IMPLANT
TUBE MAZOR SA REDUCTION (TUBING) ×1 IMPLANT
WATER STERILE IRR 1000ML POUR (IV SOLUTION) ×4 IMPLANT

## 2020-05-21 NOTE — H&P (Signed)
CC: Back pain and right leg pain  HPI:     Patient is a 70 y.o. female with DM, DCIS,  p/w progressive severe right leg pain and back pain.  She developed right foot drop as well.  Imaging showed progressively worse disc degeneration and stenosis with spondylolisthesis at L2-3, L3-4, a large right foraminal disc herniation at L4-5, and severe degenerative disease at L5-S1.  She is here for elective surgery.    Patient Active Problem List   Diagnosis Date Noted  . Statin intolerance 01/16/2018  . Bilateral leg edema 12/21/2017  . History of colonic polyps 10/18/2017  . Dyslipidemia 10/18/2017  . Essential hypertension 03/26/2016  . Family history of colon cancer 03/26/2016  . Osteopenia 08/28/2015  . 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 Spinal stenosis of lumbar region 12/05/2013  . Morbid obesity ([place]) 12/19/2012  . Diabetes mellitus with coincident hypertension ([place]) 05/15/2008  . Allergic rhinitis 05/15/2008  . Asthma, mild intermittent 05/15/2008  . Osteoarthritis 05/15/2008  . HEADACHE 05/15/2008   Past Medical History:  Diagnosis Date  . ALLERGIC RHINITIS 05/15/2008  . Anemia   . ASTHMA 05/15/2008  . Asthma   . Blood transfusion without reported diagnosis 1991   anemia  . Breast cancer ([place]) 2016   right breast  . Cancer ([place])    DCIS right breast  . Diabetes mellitus without complication ([place])   . GERD (gastroesophageal reflux disease)   . Headache(784.0) 05/15/2008  . HYPERTENSION 05/15/2008  . IMPAIRED GLUCOSE TOLERANCE 05/15/2008  . Obesity   . OSTEOARTHRITIS 05/15/2008   back    Past Surgical History:  Procedure Laterality Date  . ABDOMINAL HYSTERECTOMY    . BREAST LUMPECTOMY Right 12/17/2014  . BREAST LUMPECTOMY WITH RADIOACTIVE SEED LOCALIZATION Right 12/17/2014   Procedure: RIGHT BREAST RADIOACTIVE SEED GUIDED PARTIAL MASTECTOMY;  Surgeon: Erroll Luna, MD;  Location: [facility];  Service: General;  Laterality:  Right;  . CESAREAN SECTION    . KNEE SURGERY     arthroscopic left    Medications Prior to Admission  Medication Sig Dispense Refill Last Dose  . aspirin 81 MG tablet Take 81 mg by mouth daily.   05/14/2020  . azelastine (OPTIVAR) 0.05 % ophthalmic solution Place 1 drop into both eyes daily as needed (allergies).   05/20/2020 at Unknown time  . CALCIUM PO Take 1,000 mg by mouth daily.   05/14/2020  . cetirizine (ZYRTEC) 10 MG tablet Take 10 mg by mouth daily as needed for allergies.   Past Week at Unknown time  . chlorthalidone (HYGROTON) 25 MG tablet TAKE 1 TABLET BY MOUTH EVERY DAY (Patient taking differently: Take 25 mg by mouth daily.) 90 tablet 1 05/20/2020 at Unknown time  . EPIPEN 2-PAK 0.3 MG/0.3ML SOAJ injection Inject 0.3 mg into the muscle as needed for anaphylaxis.  1   . fluticasone (FLONASE) 50 MCG/ACT nasal spray Place 1 spray into both nostrils daily. (Patient taking differently: Place 1 spray into both nostrils daily as needed for allergies.) 16 g 5 05/20/2020 at Unknown time  . Melatonin 10 MG TABS Take 10 mg by mouth at bedtime as needed (sleep).   Past Week at Unknown time  . metFORMIN (GLUCOPHAGE-XR) 500 MG 24 hr tablet TAKE 2 TABLETS (1,000 MG TOTAL) BY MOUTH DAILY WITH SUPPER. (Patient taking differently: Take 500 mg by mouth 2 (two) times daily.) 180 tablet 1 05/20/2020 at Unknown time  . Multiple Vitamin (MULTIVITAMIN) tablet Take 1 tablet by  mouth daily.   05/17/2020  . NON FORMULARY EVERY OTHER WEEK ALLERGY INJECTIONS   05/12/2020  . potassium chloride SA (KLOR-CON M20) 20 MEQ tablet Take 1 tablet (20 mEq total) by mouth 2 (two) times daily. 180 tablet 1 Past Week at Unknown time  . Simethicone (GAS-X PO) Take 1 tablet by mouth daily as needed (gas).   Past Week at Unknown time  . traMADol (ULTRAM) 50 MG tablet Take 1 tablet (50 mg total) by mouth 2 (two) times daily as needed for severe pain. 60 tablet 0 05/21/2020 at 0100  . Accu-Chek FastClix Lancets MISC 1 EACH BY DOES NOT  APPLY ROUTE DAILY. USE FOR ACCU-CHEK GUIDE 102 each 3   . ACCU-CHEK GUIDE test strip 1 EACH BY OTHER ROUTE DAILY. 100 strip 3   . acetaminophen (TYLENOL) 650 MG CR tablet Take 650 mg by mouth every 8 (eight) hours as needed for pain.   More than a month at Unknown time  . blood glucose meter kit and supplies KIT Dispense based on patient and insurance preference. Use up to twice daily as directed. 1 each 0   . Homeopathic Products (ARNICA EX) Apply 1 application topically daily as needed (pain). Similasan Arnica Active pain relieving spray   More than a month at Unknown time  . OVER THE COUNTER MEDICATION Apply 1 application topically daily as needed (pain). KT recovery pain relieving gel   More than a month at Unknown time  . PROAIR HFA 108 (90 BASE) MCG/ACT inhaler Inhale 1 puff into the lungs every 6 (six) hours as needed for shortness of breath or wheezing.  0 More than a month at Unknown time   Allergies  Allergen Reactions  . Atorvastatin     Hallucinations   . Hydrochlorothiazide     Muscle pain  . Macrobid [Nitrofurantoin Monohyd Macro]      Liver problems  . Naproxen Swelling    Mouth and tongue swelling  . Simvastatin     Hallucinations, muscle pains  . Zanaflex [Tizanidine Hcl] Other (See Comments)     Liver problems    Social History   Tobacco Use  . Smoking status: Never Smoker  . Smokeless tobacco: Never Used  Substance Use Topics  . Alcohol use: Yes    Comment: RARELY    Family History  Problem Relation Age of Onset  . Diabetes Mother   . Hypertension Mother   . Breast cancer Mother 20       Breast cancer twice   . Hyperlipidemia Father   . Colon cancer Brother        4 th brother  . Diabetes Brother   . Pancreatic cancer Brother        middle brother  . Stomach cancer Neg Hx      Review of Systems Pertinent items noted in HPI and remainder of comprehensive ROS otherwise negative.  Objective:   Patient Vitals for the past 8 hrs:  BP Temp Temp src  Pulse SpO2 Height Weight  05/21/20 0615 (!) 147/78 98.9 F (37.2 C) Oral 100 96 % _0  (1.626 m) 102.5 kg   No intake/output data recorded. No intake/output data recorded.      General : Alert, cooperative, no distress, appears stated age   Head:  Normocephalic/atraumatic    Eyes: PERRL, conjunctiva/corneas clear, EOM's intact. Fundi could not be visualized Neck: Supple Chest:  Respirations unlabored Chest wall: no tenderness or deformity Heart: Regular rate and rhythm Abdomen: Soft, nontender and  nondistended Extremities: warm and well-perfused Skin: normal turgor, color and texture Neurologic:  Alert, oriented x 3.  Eyes open spontaneously. PERRL, EOMI, VFC, no facial droop. V1-3 intact.  No dysarthria, tongue protrusion symmetric.  CNII-XII intact.  No pronator drift, full strength in legs. 2/5 R DF, 1/5, EHL, 4/5 PF on right. + SLR. Otw normal strength.       Data Review See HPI Assessment:   Severe multilevel stenosis and degeneration with radiculopathy with foot drop, back pain  Plan:   - L2-3, L3-4 DLIF, L4-5, L5-S1 TLIFs

## 2020-05-21 NOTE — Anesthesia Procedure Notes (Signed)
Procedure Name: Intubation Date/Time: 05/21/2020 8:10 AM Performed by: Griffin Dakin, CRNA Pre-anesthesia Checklist: Patient identified, Emergency Drugs available, Suction available and Patient being monitored Patient Re-evaluated:Patient Re-evaluated prior to induction Oxygen Delivery Method: Circle system utilized Preoxygenation: Pre-oxygenation with 100% oxygen Induction Type: IV induction Ventilation: Mask ventilation without difficulty Laryngoscope Size: Mac and 3 Grade View: Grade II Tube type: Oral Tube size: 7.0 mm Number of attempts: 1 Airway Equipment and Method: Stylet and Oral airway Placement Confirmation: ETT inserted through vocal cords under direct vision,  positive ETCO2 and breath sounds checked- equal and bilateral Secured at: 22 cm Tube secured with: Tape Dental Injury: Teeth and Oropharynx as per pre-operative assessment  Comments: Intubation by [facility]

## 2020-05-21 NOTE — Anesthesia Procedure Notes (Deleted)
Performed by: Griffin Dakin, CRNA

## 2020-05-21 NOTE — Op Note (Signed)
Procedure(s): LUMBAR TWO-THREE, LUMBAR THREE-FOUR DIRECT LATERAL LUMBAR INTERBODY FUSION LUMBAR FOUR-FIVE, LUMBAR FIVE-SACRAL ONE MINIMALLY INVASIVE TRANSFORAMINAL LUMBAR INTERBODY FUSION WITH INSTRUMENTATION LUMBAR TWO-SACRAL ONE APPLICATION OF ROBOTIC ASSISTANCE FOR SPINAL PROCEDURE Procedure Note  Darlene Mclaughlin female 70 y.o. 05/21/2020  Procedure(s) and Anesthesia Type:    * LUMBAR TWO-THREE, LUMBAR THREE-FOUR DIRECT LATERAL LUMBAR INTERBODY FUSION - General    * LUMBAR FOUR-FIVE, LUMBAR FIVE-SACRAL ONE MINIMALLY INVASIVE TRANSFORAMINAL LUMBAR INTERBODY FUSION WITH INSTRUMENTATION LUMBAR TWO-SACRAL ONE - General    * APPLICATION OF ROBOTIC ASSISTANCE FOR SPINAL PROCEDURE - General  Surgeon(s) and Role:    [name], Dorcas Carrow, MD - Primary    * Kary Kos, MD - Assisting   Indications: This is a 70 year old woman who presented with severe back pain and right leg pain with foot drop.  She was found to have severe lumbar stenosis at multiple levels, including L2-3 where there is disc space collapse and mild spondylolisthesis, L3-4 where there is grade 1 spondylolisthesis, L4-5 where there is a large herniated disc with severe nerve root compression, worst in the right-sided foramen and extraforaminal space, and at L5-S1 there is severe foraminal stenosis from degenerated facet with synovial cyst.  She also had severe sagittal imbalance.  As nonsurgical measures had failed to improve her pain and given the motor deficit, I did recommend surgical treatment.  Risk, benefits, alternatives, and expected convalescence were discussed with her.  Risks discussed included, but were not limited to, bleeding, pain, infection, damage to nearby organs, neurologic deficit, pseudoarthrosis, adjacent segment disease, spinal fluid leak, coma, and death.  Informed consent was obtained.      Surgeon: Vallarie Mare   Assistants: Kary Kos, MD  Anesthesia: General    Procedure Detail  1.  L2-3  direct lateral interbody fusion using Alphatec retractor system 2.  L3-4 direct lateral interbody fusion using Alphatec retractor system 3.  L4-5 right transforaminal lumbar interbody fusion using tubular retractor system, including laminotomy, facetectomy, discectomy and decompression 4.  L5-S1 right transforaminal lumbar interbody fusion using tubular retractor system, including laminotomy, facetectomy, discectomy, and decompression. 5.  Segmental instrumentation with percutaneous pedicle screws and rod placement at L2-L3-L4-L5-S1 with robotic assistance with Mazor 6.  Placement of interbody cage at L2-3, L3-4, L4-5, and L5-S1 7.  Harvest of local autograft 8.  Use of morcellized allograft and BMP 9.  Use of microscope for intraoperative microdissection.  LUMBAR TWO-THREE, LUMBAR THREE-FOUR DIRECT LATERAL LUMBAR INTERBODY FUSION, LUMBAR FOUR-FIVE, LUMBAR FIVE-SACRAL ONE MINIMALLY INVASIVE TRANSFORAMINAL LUMBAR INTERBODY FUSION WITH INSTRUMENTATION LUMBAR TWO-SACRAL ONE, APPLICATION OF ROBOTIC ASSISTANCE FOR SPINAL PROCEDURE  Patient was brought to the operating room.  After general anesthesia was induced and appropriate lines and monitors were placed, patient was positioned in the lateral decubitus position with all pressure points padded and eyes protected, with the left side up.  C-arm x-ray was used to confirm physician in the lateral position with true AP and lateral x-rays.  A linear incision was planned over the L2-3 and L3-4 disc spaces with assistance of C arm.  The flank was preprepped with alcohol and prepped and draped in sterile fashion.  1% lidocaine with epinephrine was injected into the field.  A timeout was performed.  Antibiotics were introduced.  Incision was made with a 10 blade and the subcutaneous tissue and fascia were divided.  The external oblique, internal oblique, transversalis muscle and fascia were opened bluntly to enter the retroperitoneal space.  Peritoneal contents were  reflected anteriorly and the  psoas muscles was dissected out.  Initial dilator was placed over the surface of the psoas muscle and then passed into the L2-3 disc space onto a large osteophyte.  Stimulation revealed no nearby lumbar plexus.  Sequential dilators were placed until the retractor was placed and expanded.  No nerves were seen nor able to be stimulated in the field.  A posterior shim was placed to Dr. Charline Bills.  Discectomy was then performed with the 15 blade, rongeurs, and sequentially increasing size trials were used to reexpand the disc space under C arm x-ray with AP and lateral control.  Finally, a 8 x 18 x 50 mm cage was placed using C arm.  Good height restoration was obtained.  The retractor was then removed.  Dilator was then placed on the psoas muscle over the L3-4 disc space and then passed through the psoas onto a lateral osteophyte.  It was stimulated and there was no nearby the lumbar plexus.  Sequential dilators were placed and stimulated and the retractor was placed.  No visible nerve was seen nor could be stimulated in the field including after expansion.  Posterior shim was placed in the disc space.  The lateral osteophyte was removed with rongeurs and discectomy was performed with 15 blade and rongeurs.  The endplates were prepared.  Sequential trials were placed until a 10 x 22 x 50 mm cage was placed using C-arm x-ray in the AP and lateral projections which confirmed good positioning.  The retractor was withdrawn and meticulous hemostasis was obtained.  The fascia was closed with 0 Vicryl stitches.  The subcutaneous tissue was closed with 2-0 Vicryl stitch in buried infra fashion.  Skin was closed with 4 Monocryl subcuticular manner followed by Dermabond.  Patient was then flipped supine on a Jackson table.  Using AP and lateral C-arm x-rays, incision was made on the right side over the L4-5 and L5-S1 facets.  Initial dilator and sequential dilators were placed and tubular retractor  was docked on the L4-5 facet.  Microscope was introduced in the field to allow for intraoperative microdissection.  L4-5 facetectomy was performed with harvest of autograft.  The tube was angled medially to perform a generous laminotomy, exposing the dorsal dura given her severe stenosis there.  It was then angled in a less medial trajectory and the traversing and exiting nerve roots were identified.  Large calcified disc fragments were removed from the foramen and extraforaminal he as well as underneath the nerve root and centrally using angled instruments.  A generous discectomy was performed.  Disc shavers and rongeurs as well as curettes were used to perform the discectomy and prepare the endplates.  The interbody space was packed with autograft, allograft an expandable cage was then placed under x-ray guidance and expanded to help restore some lordosis at L4-5.  C-arm x-ray confirmed good placement meant.  Meticulous hemostasis obtained.  Dilators were then used to place tubular retractor at the L5-S1 facet on the right side.  The facetectomy was then performed with harvest of autograft.  Large synovial cyst was encountered in the foramen and was removed.  A fair amount of degenerative calcified material had to be dissected from the exiting L5 nerve root.  Once this was complete, discectomy was performed with 15 blade followed by rongeurs, disc shavers and the endplates were prepared.  Trials and the disc base was filled with autograft and allograft.  Then finally expandable cage was placed with good expansion and lordosis noted.  Meticulous hemostasis obtained  and the tubular retractor was withdrawn.  A PSIS pin was then placed and x-rays were performed to register the patient's to the [place] robot.  Preoperative CT was used to plan trajectories for screws.  Paramedian incisions were expanded on the right side and made on the left side and using the robot, the fascia was incised and the pedicles were cannulated  with a drill followed by K wire.  AP and lateral C-arm x-ray confirmed good placement of the K wires.  Pedicle screws were then placed over the K wire using C arm x-ray for control with good purchase noted.  AP and lateral x-rays showed good placement of the pedicle screws.  130 mm lordotic rods were then placed bilaterally through the screw towers and then secured in place with screw caps and final tightened.  Good lordotic reduction was achieved.  Screw towers were then removed.  Wounds were irrigated thoroughly.  The wounds were closed with 0 Vicryl stitches, 2-0 Vicryl stitches, 4 Monocryl followed by Dermabond and a sterile dressing.  Patient was then flipped supine and extubated by the anesthesia service.  All counts were correct at the end of surgery.  No complications were noted.  Findings: Successful improvement of sagittal balance and fusion  Estimated Blood Loss:  250 ml         Drains: none         Total IV Fluids: see anesthesia records  Blood Given: none          Specimens: none         Implants:  IdentiTi Cages 8 x 18 x 50 with 10 degree lordosis at L2-3 10 x 22 x 50 mm with 10 degree lordosis of L3-4  Catalyft cages Expandable 7 to 14 mm x 29 mm MDT cage at L4-5 Expandable 7 to 14 mm x 27 mm, 22 degree MDT cage at L5-S1  Pedicle screws Alphatec Invictus 5.5 x 45 mm screws in L2 5.5 x 45 mm screws at L3 6.5 x 45 mm screws at L4 6.5 x 45 mm screws at L5 7.5 x 40 mm screws at S1  130 mm rods bilaterally        Complications:  * No complications entered in OR log *         Disposition: PACU - hemodynamically stable.         Condition: stable

## 2020-05-21 NOTE — Transfer of Care (Signed)
Immediate Anesthesia Transfer of Care Note  Patient: Darlene Mclaughlin  Procedure(s) Performed: LUMBAR TWO-THREE, LUMBAR THREE-FOUR DIRECT LATERAL LUMBAR INTERBODY FUSION (Left Flank) LUMBAR FOUR-FIVE, LUMBAR FIVE-SACRAL ONE MINIMALLY INVASIVE TRANSFORAMINAL LUMBAR INTERBODY FUSION WITH INSTRUMENTATION LUMBAR TWO-SACRAL ONE (Right Back) APPLICATION OF ROBOTIC ASSISTANCE FOR SPINAL PROCEDURE (N/A )  Patient Location: PACU  Anesthesia Type:General  Level of Consciousness: drowsy and patient cooperative  Airway & Oxygen Therapy: Patient Spontanous Breathing  Post-op Assessment: Report given to RN and Post -op Vital signs reviewed and stable  Post vital signs: Reviewed and stable  Last Vitals:  Vitals Value Taken Time  BP 126/85 05/21/20 1824  Temp 36.8 C 05/21/20 1825  Pulse 89 05/21/20 1833  Resp 14 05/21/20 1833  SpO2 100 % 05/21/20 1833  Vitals shown include unvalidated device data.  Last Pain:  Vitals:   05/21/20 1825  TempSrc:   PainSc: Asleep      Patients Stated Pain Goal: 7 (40/35/24 8185)  Complications: No complications documented.

## 2020-05-21 NOTE — Progress Notes (Signed)
Orthopedic Tech Progress Note Patient Details:  Darlene Mclaughlin Aug 18, 1950 [id number] Fitted patient with LSO while in PACU Patient ID: Darlene Mclaughlin, female   DOB: 1950/11/08, 70 y.o.   MRN: [id number]   [name] 05/21/2020, 6:59 PM

## 2020-05-21 NOTE — Anesthesia Procedure Notes (Signed)
Arterial Line Insertion Start/End3/30/2022 7:19 AM Performed by: Griffin Dakin, CRNA, CRNA  Patient location: Pre-op. Preanesthetic checklist: patient identified, IV checked, site marked, risks and benefits discussed, surgical consent, monitors and equipment checked, pre-op evaluation, timeout performed and anesthesia consent Lidocaine 1% used for infiltration radial was placed Catheter size: 20 G Hand hygiene performed , maximum sterile barriers used  and Seldinger technique used Allen's test indicative of satisfactory collateral circulation Attempts: 1 Procedure performed without using ultrasound guided technique. Following insertion, dressing applied and Biopatch. Post procedure assessment: normal  Patient tolerated the procedure well with no immediate complications. Additional procedure comments: Line placed by SRNA.

## 2020-05-22 ENCOUNTER — Encounter (HOSPITAL_COMMUNITY): Payer: Self-pay | Admitting: Neurosurgery

## 2020-05-22 LAB — GLUCOSE, CAPILLARY
Glucose-Capillary: 170 mg/dL — ABNORMAL HIGH (ref 70–99)
Glucose-Capillary: 194 mg/dL — ABNORMAL HIGH (ref 70–99)
Glucose-Capillary: 238 mg/dL — ABNORMAL HIGH (ref 70–99)
Glucose-Capillary: 220 mg/dL — ABNORMAL HIGH (ref 70–99)

## 2020-05-22 MED ORDER — INSULIN ASPART 100 UNIT/ML ~~LOC~~ SOLN
0.0000 [IU] | Freq: Every day | SUBCUTANEOUS | Status: DC
  Administered 2020-05-22 – 2020-05-23 (×2): 2 [IU] via SUBCUTANEOUS

## 2020-05-22 MED ORDER — INSULIN ASPART 100 UNIT/ML ~~LOC~~ SOLN
0.0000 [IU] | Freq: Three times a day (TID) | SUBCUTANEOUS | Status: DC
  Administered 2020-05-22: 5 [IU] via SUBCUTANEOUS
  Administered 2020-05-23: 3 [IU] via SUBCUTANEOUS
  Administered 2020-05-23 (×2): 5 [IU] via SUBCUTANEOUS
  Administered 2020-05-24: 3 [IU] via SUBCUTANEOUS
  Administered 2020-05-24: 2 [IU] via SUBCUTANEOUS
  Administered 2020-05-24 – 2020-05-25 (×3): 3 [IU] via SUBCUTANEOUS
  Administered 2020-05-25: 2 [IU] via SUBCUTANEOUS
  Administered 2020-05-26 (×2): 3 [IU] via SUBCUTANEOUS

## 2020-05-22 NOTE — Progress Notes (Signed)
Occupational Therapy Evaluation Patient Details Name: Darlene Mclaughlin MRN: [id number] DOB: 1950-10-24 Today's Date: 05/22/2020    History of Present Illness 70 yo female s/p L2-3 L3-4 lateral lumbar interbody fusion, L4-5 L5-S1 XLIF on 3/30. PMH includes LE edema, HTN, osteopenia, DCIS R breast, obestity, DM, OA.   Clinical Impression   PTA pt lives alone and was modified independent with ADL and mobility with use of a straight cane. Pt with increased difficulty with IADL tasks due to increased back pain and R foot drop over the last several months. Pt requires min A with mobility @ RW level and Max A with LB ADL tasks. Began education regarding compensatory strateiges for ADL and functional mobility for ADL while incorporating back precautions. Son present for session and educated on availability of DME and AE. Sons will be able to provide 24/7 S after DC. Recommend follow up with HHOT. Will follow acutely to facilitate safe DC home.  Follow Up Recommendations  Home health OT;Other (comment);Supervision - Intermittent (S for ADL and mobility)    Equipment Recommendations  Tub/shower bench    Recommendations for Other Services       Precautions / Restrictions Precautions Precautions: Fall;Back Precaution Booklet Issued: Yes (comment) Precaution Comments: Reviewed BLT rules, log roll in/out of bed Required Braces or Orthoses: Spinal Brace Spinal Brace: Lumbar corset;Applied in sitting position      Mobility Bed Mobility Overal bed mobility: Needs Assistance Bed Mobility: Sit to Sidelying Rolling: Min assist       Sit to sidelying: Mod assist General bed mobility comments: assist to lift BLE onto bed    Transfers Overall transfer level: Needs assistance   Transfers: Sit to/from Stand Sit to Stand: Min assist         General transfer comment: vc for hand placement and posture    Balance Overall balance assessment: Needs assistance   Sitting balance-Leahy Scale:  Good       Standing balance-Leahy Scale: Poor                             ADL either performed or assessed with clinical judgement   ADL Overall ADL's : Needs assistance/impaired     Grooming: Supervision/safety;Set up;Standing   Upper Body Bathing: Set up;Supervision/ safety;Sitting   Lower Body Bathing: Moderate assistance;Sit to/from stand   Upper Body Dressing : Minimal assistance;Sitting Upper Body Dressing Details (indicate cue type and reason): Assist to donn/doff brace Lower Body Dressing: Maximal assistance;Sit to/from stand   Toilet Transfer: Minimal assistance;RW;Ambulation;Comfort height toilet;Grab bars   Toileting- Clothing Manipulation and Hygiene: Maximal assistance;Sit to/from stand;Sitting/lateral lean     Tub/Shower Transfer Details (indicate cue type and reason): Educated pt/son on use of tub bench; educated on availability of DME Functional mobility during ADLs: Minimal assistance;Cueing for safety;Cueing for sequencing;Rolling walker (VC for hand placement and positioning in RW) General ADL Comments: Pt unable to complete figure 4 and will benefit from use of AE. Educated son on availability of hip kit and toilet aid. Began education on cmopensatory strategies for ADL tasks     Vision         Perception     Praxis      Pertinent Vitals/Pain Pain Assessment: 0-10 Pain Score: 10-Worst pain ever Pain Location: back Pain Descriptors / Indicators: Aching;Discomfort;Grimacing;Guarding Pain Intervention(s): Limited activity within patient's tolerance;Repositioned;Ice applied;Patient requesting pain meds-RN notified     Hand Dominance Right   Extremity/Trunk Assessment Upper Extremity Assessment  Upper Extremity Assessment: Overall WFL for tasks assessed   Lower Extremity Assessment Lower Extremity Assessment: Defer to PT evaluation   Cervical / Trunk Assessment Cervical / Trunk Assessment: Other exceptions;Kyphotic (increased body  habitus; back sx)   Communication Communication Communication: No difficulties   Cognition Arousal/Alertness: Awake/alert Behavior During Therapy: WFL for tasks assessed/performed;Anxious Overall Cognitive Status: Within Functional Limits for tasks assessed                                     General Comments  SpO2 98 on RA during activity    Exercises     Shoulder Instructions      Home Living Family/patient expects to be discharged to:: Private residence Living Arrangements: Alone Available Help at Discharge: Family;Available 24 hours/day (can d/c home to son's house, works from home so is available 24/7) Type of Home: House Home Access: Stairs to enter [employer] of Steps: 1 (short step)   Home Layout: One level     Bathroom Shower/Tub: IT trainer: Handicapped height Bathroom Accessibility: Yes How Accessible: Accessible via walker Home Equipment: Cane - single point          Prior Functioning/Environment Level of Independence: Independent with assistive device(s)        Comments: pt reports using cane for ambulation PTA given RLE weakness and pain, otherwise indepedent with mobility and ADLs; hx of R footdrop; has had difficulty cleaning her house adn doing the things she enjoys due to back pain        OT Problem List: Decreased strength;Decreased range of motion;Decreased activity tolerance;Impaired balance (sitting and/or standing);Decreased safety awareness;Decreased knowledge of use of DME or AE;Decreased knowledge of precautions;Obesity;Pain      OT Treatment/Interventions: Self-care/ADL training;DME and/or AE instruction;Therapeutic activities;Patient/family education;Balance training    OT Goals(Current goals can be found in the care plan section) Acute Rehab OT Goals Patient Stated Goal: wants to be able to take care of herself OT Goal Formulation: With patient Time For Goal Achievement:  06/05/20 Potential to Achieve Goals: Good  OT Frequency: Min 2X/week   Barriers to D/C:            Co-evaluation              AM-PAC OT "6 Clicks" Daily Activity     Outcome Measure Help from another person eating meals?: None Help from another person taking care of personal grooming?: A Little Help from another person toileting, which includes using toliet, bedpan, or urinal?: A Lot Help from another person bathing (including washing, rinsing, drying)?: A Lot Help from another person to put on and taking off regular upper body clothing?: A Little Help from another person to put on and taking off regular lower body clothing?: A Lot 6 Click Score: 16   End of Session Equipment Utilized During Treatment: Gait belt;Rolling walker;Back brace Nurse Communication: Mobility status;Precautions;Patient requests pain meds  Activity Tolerance: Patient tolerated treatment well Patient left: in bed;with call bell/phone within reach;with bed alarm set;with family/visitor present  OT Visit Diagnosis: Unsteadiness on feet (R26.81);Other abnormalities of gait and mobility (R26.89)                Time: 8119-1478 OT Time Calculation (min): 47 min Charges:  OT General Charges $OT Visit: 1 Visit OT Evaluation $OT Eval Low Complexity: 1 Low OT Treatments $Self Care/Home Management : 23-37 mins  Joselynn Amoroso, OT/L  Acute OT Clinical Specialist Acute Rehabilitation Services Pager [phone] Office [phone]   [facility] 05/22/2020, 4:12 PM

## 2020-05-22 NOTE — Progress Notes (Signed)
Initial Nutrition Assessment  DOCUMENTATION CODES:  Not applicable  INTERVENTION: Will make supplement recommendations if nausea persists and intake does not improve.  NUTRITION DIAGNOSIS:  Predicted suboptimal nutrient intake related to nausea as evidenced by per patient/family report.  GOAL:  Patient will meet greater than or equal to 90% of their needs  MONITOR:  PO intake,Other (Comment) (GI symptoms)  REASON FOR ASSESSMENT:  Malnutrition Screening Tool    ASSESSMENT:  Pt admitted for planned spinal surgery after having progressive right leg and back pain. 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 PMH relevant for HTN, osteopenia, DM, dyslipidemia, breast cancer, and GERD..  Medications reviewed and include: calcium carbonate 500mg , colace, metformin, MVI with minerals, and potassium chloride 93mEq BID  Labs reviewed: glucose elevated (frequently >180mg /dL, metformin in place), and K 3.3 on 3/30   3/30: Op, lumbar interbody fusion  Pt resting in bedside chair at the time of visit. Breakfast tray at bedside untouched. Pt reports that she toelrated water since surgery, but started feeling nauseated this AM and hasn't been able to eat her meal yet. RN recently administered antiemetic. Inquired about weight loss that was reported on admission. Pt reports that the loss was intentional. Reports she has been more mindful of the foods she was consuming and prior to pandemic had been going to water aerobics regularly. Reports she continues to consume 3 meals each day. Will monitor pt's intake for now and recommend nutrition supplements if intake does not improve with the resolution of nausea.   NUTRITION - FOCUSED PHYSICAL EXAM: Flowsheet Row Most Recent Value  Orbital Region No depletion  Upper Arm Region No depletion  Thoracic and Lumbar Region No depletion  Buccal Region No depletion  Temple Region No depletion  Clavicle Bone Region  No depletion  Clavicle and Acromion Bone Region No depletion  Scapular Bone Region No depletion  Dorsal Hand No depletion  Patellar Region No depletion  Anterior Thigh Region No depletion  Posterior Calf Region No depletion  Edema (RD Assessment) None  Hair Reviewed  Eyes Reviewed  Mouth Reviewed  Skin Reviewed  Nails Reviewed     Diet Order:   Diet Order            Diet regular Room service appropriate? Yes; Fluid consistency: Thin  Diet effective now                 EDUCATION NEEDS:  No education needs have been identified at this time  Skin:  Skin Assessment: Reviewed RN Assessment (surgical incisions to the back and flank)  Last BM:  3/30 per pt (PTA)  Height:  Ht Readings from Last 1 Encounters:  05/21/20 5\' 4"  (1.626 m)    Weight:  Wt Readings from Last 1 Encounters:  05/21/20 102.5 kg    Ideal Body Weight:  54.5 kg / 120 lb   BMI:  Body mass index is 38.79 kg/m.  Estimated Nutritional Needs:   Kcal:  1600-1800 kcal  Protein:  75-85g  Fluid:  >1530mL/d   Ranell Patrick, RD, LDN 05/22/2020 1:02 PM

## 2020-05-22 NOTE — Anesthesia Postprocedure Evaluation (Signed)
Anesthesia Post Note  Patient: Darlene Mclaughlin  Procedure(s) Performed: LUMBAR TWO-THREE, LUMBAR THREE-FOUR DIRECT LATERAL LUMBAR INTERBODY FUSION (Left Flank) LUMBAR FOUR-FIVE, LUMBAR FIVE-SACRAL ONE MINIMALLY INVASIVE TRANSFORAMINAL LUMBAR INTERBODY FUSION WITH INSTRUMENTATION LUMBAR TWO-SACRAL ONE (Right Back) APPLICATION OF ROBOTIC ASSISTANCE FOR SPINAL PROCEDURE (N/A )     Patient location during evaluation: PACU Anesthesia Type: General Level of consciousness: awake Pain management: pain level controlled Vital Signs Assessment: post-procedure vital signs reviewed and stable Respiratory status: spontaneous breathing Cardiovascular status: stable Postop Assessment: no apparent nausea or vomiting Anesthetic complications: no   No complications documented.  Last Vitals:  Vitals:   05/21/20 1940 05/21/20 2000  BP: (!) 145/85 (!) 166/66  Pulse: 97 100  Resp: 17 18  Temp: 36.6 C 36.9 C  SpO2: 100% 98%    Last Pain:  Vitals:   05/21/20 2255  TempSrc:   PainSc: 9                  Marji Kuehnel

## 2020-05-22 NOTE — Evaluation (Signed)
Physical Therapy Evaluation Patient Details Name: Darlene Mclaughlin MRN: [id number] DOB: 08/18/1950 Today's Date: 05/22/2020   History of Present Illness  70 yo female s/p L2-3 L3-4 lateral lumbar interbody fusion, L4-5 L5-S1 XLIF on 3/30. PMH includes LE edema, HTN, osteopenia, DCIS R breast, obestity, DM, OA.  Clinical Impression   Pt presents with moderate to severe back and RLE pain, difficulty performing mobility tasks, decreased knowledge and application of spinal precautions during mobility, impaired gait with +R foot drop, and decreased activity tolerance vs baseline. Pt to benefit from acute PT to address deficits. Pt ambulated x2 short room distances on eval, requiring min-mod assist throughout mobility and cuing for form/safety. Based on pt's current assist level and limited mobility, PT recommending ST-SNF but PT anticipates pt will progress to HHPT level with acute therapies. PT to progress mobility as tolerated, and will continue to follow acutely.      Follow Up Recommendations SNF (suspect pt to progress to home with HHPT level, given support of son)    Equipment Recommendations  Rolling walker with 5" wheels;3in1 (PT)    Recommendations for Other Services       Precautions / Restrictions Precautions Precautions: Fall;Back Precaution Booklet Issued: Yes (comment) Precaution Comments: Reviewed BLT rules, log roll in/out of bed Required Braces or Orthoses: Spinal Brace Spinal Brace: Lumbar corset;Applied in sitting position Restrictions Weight Bearing Restrictions: No      Mobility  Bed Mobility Overal bed mobility: Needs Assistance Bed Mobility: Rolling;Sidelying to Sit Rolling: Mod assist Sidelying to sit: Mod assist       General bed mobility comments: mod assist for trunk and LE management, scooting to EOB. Verbal cuing for log roll technique, increased time and effort.    Transfers Overall transfer level: Needs assistance Equipment used: Rolling walker  (2 wheeled) Transfers: Sit to/from Stand Sit to Stand: Mod assist         General transfer comment: Mod assist for power up, rise, and steady, verbal cuing for sequencing task, hand placement when rising/sitting, and rocking AP from hips to initiate stand as needed. STS x2, from EOB and toilet.  Ambulation/Gait Ambulation/Gait assistance: Min assist Gait Distance (Feet): 15 Feet (x2, to and from toilet) Assistive device: Rolling walker (2 wheeled) Gait Pattern/deviations: Step-through pattern;Decreased stride length;Trunk flexed Gait velocity: decr   General Gait Details: min assist to steady, navigate RW at times. Verbal cuing for placement in RW, upright posture, and sequencing gait. + R foot drop.  Stairs            Wheelchair Mobility    Modified Rankin (Stroke Patients Only)       Balance Overall balance assessment: Needs assistance Sitting-balance support: Bilateral upper extremity supported Sitting balance-Leahy Scale: Fair Sitting balance - Comments: able to sit EOB without PT assist   Standing balance support: Bilateral upper extremity supported;During functional activity Standing balance-Leahy Scale: Poor Standing balance comment: reliant on external support                             Pertinent Vitals/Pain Pain Assessment: 0-10 Pain Score: 10-Worst pain ever Pain Location: back and RLE, during mobility Pain Descriptors / Indicators: Sore;Discomfort;Grimacing Pain Intervention(s): Limited activity within patient's tolerance;Monitored during session;Repositioned    Home Living Family/patient expects to be discharged to:: Private residence Living Arrangements: Alone Available Help at Discharge: Family;Available 24 hours/day (can d/c home to son's house, works from home so is available 24/7) Type of Home:  House Home Access: Stairs to enter   [employer] of Steps: 1 (short step) Home Layout: One level Home Equipment: Cane -  single point      Prior Function Level of Independence: Independent with assistive device(s)         Comments: pt reports using cane for ambulation PTA given RLE weakness and pain, otherwise indepedent with mobility and ADLs     Hand Dominance   Dominant Hand: Right    Extremity/Trunk Assessment   Upper Extremity Assessment Upper Extremity Assessment: Defer to OT evaluation    Lower Extremity Assessment Lower Extremity Assessment: Generalized weakness;RLE deficits/detail RLE Deficits / Details: at least 3/5 flexion/extension, knee extension, knee flexion as assessed in supine. Pt with inability to DF with history of foot drop since 10/2019.    Cervical / Trunk Assessment Cervical / Trunk Assessment: Kyphotic  Communication   Communication: No difficulties  Cognition Arousal/Alertness: Awake/alert Behavior During Therapy: WFL for tasks assessed/performed;Anxious Overall Cognitive Status: Within Functional Limits for tasks assessed                                 General Comments: anxiety with mobility, requires increased time for mobility and following commands secondary to this      General Comments      Exercises     Assessment/Plan    PT Assessment Patient needs continued PT services  PT Problem List Decreased strength;Decreased mobility;Decreased activity tolerance;Decreased balance;Decreased knowledge of use of DME;Pain;Obesity;Decreased safety awareness       PT Treatment Interventions DME instruction;Therapeutic activities;Gait training;Therapeutic exercise;Patient/family education;Balance training;Stair training;Functional mobility training;Neuromuscular re-education    PT Goals (Current goals can be found in the Care Plan section)  Acute Rehab PT Goals Patient Stated Goal: go home PT Goal Formulation: With patient Time For Goal Achievement: 05/30/20 Potential to Achieve Goals: Good    Frequency Min 5X/week   Barriers to discharge         Co-evaluation               AM-PAC PT "6 Clicks" Mobility  Outcome Measure Help needed turning from your back to your side while in a flat bed without using bedrails?: A Lot Help needed moving from lying on your back to sitting on the side of a flat bed without using bedrails?: A Lot Help needed moving to and from a bed to a chair (including a wheelchair)?: A Lot Help needed standing up from a chair using your arms (e.g., wheelchair or bedside chair)?: A Lot Help needed to walk in hospital room?: A Little Help needed climbing 3-5 steps with a railing? : A Lot 6 Click Score: 13    End of Session Equipment Utilized During Treatment: Back brace Activity Tolerance: Patient tolerated treatment well;Patient limited by fatigue Patient left: in chair;with call bell/phone within reach;with family/visitor present Nurse Communication: Mobility status;Other (comment) (needs posey alarm box, needs diet orders in chart, urinated for first time since catheter pulled) PT Visit Diagnosis: Other abnormalities of gait and mobility (R26.89);Difficulty in walking, not elsewhere classified (R26.2)    Time: 8016-5537 PT Time Calculation (min) (ACUTE ONLY): 31 min   Charges:   PT Evaluation $PT Eval Low Complexity: 1 Low PT Treatments $Gait Training: 8-22 mins        Stacie Glaze, PT Acute Rehabilitation Services Pager [phone]  Office [phone]   Roxine Caddy E Ruffin Pyo 05/22/2020, 11:56 AM

## 2020-05-22 NOTE — Progress Notes (Signed)
Subjective: Patient reports some nausea but + flatus.    Objective: Vital signs in last 24 hours: Temp:  [97.8 F (36.6 C)-99.8 F (37.7 C)] 99.1 F (37.3 C) (03/31 1020) Pulse Rate:  [86-103] 101 (03/31 1020) Resp:  [12-18] 17 (03/31 1020) BP: (114-166)/(54-85) 122/62 (03/31 1020) SpO2:  [95 %-100 %] 97 % (03/31 1020)  Intake/Output from previous day: 03/30 0701 - 03/31 0700 In: 3650 [I.V.:2800; IV Piggyback:850] Out: 2250 [Urine:2000; Blood:250] Intake/Output this shift: No intake/output data recorded.  NAD.  Sitting in chair Right foot 3/5 DF, 4/5 PF, 4/5 L HF, otw 5/5 Incisions c/d  Lab Results: Recent Labs    05/21/20 1453 05/21/20 1533  HGB 11.9* 9.9*  HCT 35.0* 29.0*   BMET Recent Labs    05/21/20 1317 05/21/20 1453 05/21/20 1533  NA 139 140 140  K 3.0* 3.3* 3.3*  CL 102 103  --   GLUCOSE 214* 186*  --   BUN 10 9  --   CREATININE 0.30* 0.30*  --     Studies/Results: DG Lumbar Spine 2-3 Views  Result Date: 05/21/2020 CLINICAL DATA:  Elective surgery. EXAM: LUMBAR SPINE - 2-3 VIEW; DG C-ARM 1-60 MIN COMPARISON:  Preoperative lumbar CT. FINDINGS: Three fluoroscopic spot views obtained in the operating room of the lumbar spine. Posterior rod with intrapedicular screws traverse L2 through S1 with interbody spacers in place. Total fluoroscopy time 3 minutes 3 seconds. Total dose 181.36 mGy. IMPRESSION: Procedural fluoroscopy during posterior lumbar fusion. Electronically Signed   By: Keith Rake M.D.   On: 05/21/2020 18:04   DG Lumbar Spine 2-3 Views  Result Date: 05/21/2020 CLINICAL DATA:  Status post disc spacer placements EXAM: LUMBAR SPINE - 2-3 VIEW COMPARISON:  None. FLUOROSCOPY TIME:  1 minutes 47 seconds; 52.76 mGy; 2 acquired images FINDINGS: Frontal and lateral views obtained. There are disc spacers at L2-3 and L3-4. Beyond the disc spacers, no radiopaque foreign body evident. No fracture or spondylolisthesis. There is moderate disc space  narrowing at L1-2. Other disc spaces appear unremarkable. IMPRESSION: Disc spacers at L2-3 and L3-4. No other radiopaque foreign bodies. Disc space narrowing at L1-2. Other disc spaces appear unremarkable. No fracture or spondylolisthesis. These results were called by telephone at the time of interpretation on 05/21/2020 at 11:31 am to provider Loreli Dollar, who verbally acknowledged these results. Electronically Signed   By: Lowella Grip III M.D.   On: 05/21/2020 11:32   DG C-Arm 1-60 Min  Result Date: 05/21/2020 CLINICAL DATA:  Elective surgery. EXAM: LUMBAR SPINE - 2-3 VIEW; DG C-ARM 1-60 MIN COMPARISON:  Preoperative lumbar CT. FINDINGS: Three fluoroscopic spot views obtained in the operating room of the lumbar spine. Posterior rod with intrapedicular screws traverse L2 through S1 with interbody spacers in place. Total fluoroscopy time 3 minutes 3 seconds. Total dose 181.36 mGy. IMPRESSION: Procedural fluoroscopy during posterior lumbar fusion. Electronically Signed   By: Keith Rake M.D.   On: 05/21/2020 18:04    Assessment/Plan: 70 yo F s/p L2-3, L3-4 DLIFs, L4-5, L5-S1 TLIFs POD #1 - cont PT/OT - possible SNF vs home PT - lovenox ppx tomorrow   LOS: 1 day      Vallarie Mare 05/22/2020, 12:58 PM

## 2020-05-23 LAB — GLUCOSE, CAPILLARY
Glucose-Capillary: 185 mg/dL — ABNORMAL HIGH (ref 70–99)
Glucose-Capillary: 217 mg/dL — ABNORMAL HIGH (ref 70–99)
Glucose-Capillary: 225 mg/dL — ABNORMAL HIGH (ref 70–99)
Glucose-Capillary: 214 mg/dL — ABNORMAL HIGH (ref 70–99)

## 2020-05-23 NOTE — Progress Notes (Signed)
Occupational Therapy Treatment Patient Details Name: [name] MRN: [id number] DOB: 1950/06/23 Today's Date: 05/23/2020    History of present illness 70 yo female s/p L2-3 L3-4 lateral lumbar interbody fusion, L4-5 L5-S1 XLIF on 3/30. PMH includes LE edema, HTN, osteopenia, DCIS R breast, obestity, DM, OA.   OT comments  Pt's son reported that he purchased AE and tub bench. Pt completed all functional mobility with moderate assistance for and min vc safety and adherence to back precautions. Pt was educated on AE kit and toilet tongs, with good demonstration . Pt's HR was 109 at the start of the session and went to 136 with activity, pt with without signs or symptoms, and resolved to 110 with sitting rest. Pt continues to progress towards goals and would benefit from continued OT for functional mobility and increased independence in ADLs. OT to follow acutely.    Follow Up Recommendations  Home health OT;Supervision - Intermittent    Equipment Recommendations  Tub/shower bench       Precautions / Restrictions Precautions Precautions: Back;Fall Precaution Comments: Reviewed BLT rules, log roll in/out of bed Required Braces or Orthoses: Spinal Brace Spinal Brace: Lumbar corset;Applied in sitting position (Educated son, Darlene Mclaughlin, on how to apply LSO) Restrictions Weight Bearing Restrictions: No Other Position/Activity Restrictions: No bending, lifting or twisting       Mobility Bed Mobility Overal bed mobility: Needs Assistance Bed Mobility: Rolling;Sidelying to Sit Rolling: Min assist Sidelying to sit: Mod assist       General bed mobility comments: Assist to adhere to log rolling for back precautions, required assistance to push from side-lying to sitting    Transfers Overall transfer level: Needs assistance Equipment used: Rolling walker (2 wheeled) Transfers: Sit to/from Stand           General transfer comment: vc for hand placement and posture    Balance  Overall balance assessment: Needs assistance         Standing balance support: Bilateral upper extremity supported Standing balance-Leahy Scale: Poor Standing balance comment: reliant on external support                           ADL either performed or assessed with clinical judgement   ADL Overall ADL's : Needs assistance/impaired     Grooming: Wash/dry hands;Min guard;Cueing for safety;Standing Grooming Details (indicate cue type and reason): Pt washed hands while standing at the sink with RW, required min guard Darlene for safety       Upper Body Dressing : Cueing for safety;Sitting;Maximal assistance Upper Body Dressing Details (indicate cue type and reason): Assist to donn/doff brace Lower Body Dressing: Moderate assistance;With adaptive equipment;Cueing for back precautions;Cueing for safety;Adhering to back precautions   Toilet Transfer: Min guard   Toileting- Clothing Manipulation and Hygiene: Maximal assistance Toileting - Clothing Manipulation Details (indicate cue type and reason): Pt educated on how to use toilet tongs for hygiene, adn demonstrated understanding however required max Darlene for hygiene this session     Functional mobility during ADLs: Minimal assistance;Cueing for safety;Rolling walker General ADL Comments: Pt educated on AE this session (toilet tongs, sock aide, reacher, and long handle sponge), pt demonstrated good understanding of AE. Son reported that he has purchased all AE.               Cognition Arousal/Alertness: Awake/alert Behavior During Therapy: WFL for tasks assessed/performed Overall Cognitive Status: Within Functional Limits for tasks assessed  Pertinent Vitals/ Pain       Pain Assessment: 0-10 Pain Score: 5  Pain Location: back Pain Descriptors / Indicators: Aching;Discomfort;Grimacing;Guarding Pain Intervention(s): Limited activity within patient's  tolerance;Patient requesting pain meds-RN notified;RN gave pain meds during session         Frequency  Min 2X/week        Progress Toward Goals  OT Goals(current goals can now be found in the care plan section)  Progress towards OT goals: Progressing toward goals     Plan Discharge plan remains appropriate    Co-evaluation                 AM-PAC OT "6 Clicks" Daily Activity     Outcome Measure   Help from another person eating meals?: Darlene Little Help from another person taking care of personal grooming?: Darlene Little Help from another person toileting, which includes using toliet, bedpan, or urinal?: Darlene Lot Help from another person bathing (including washing, rinsing, drying)?: Darlene Lot Help from another person to put on and taking off regular upper body clothing?: Darlene Little Help from another person to put on and taking off regular lower body clothing?: Darlene Lot 6 Click Score: 15    End of Session Equipment Utilized During Treatment: Gait belt;Rolling walker  OT Visit Diagnosis: Unsteadiness on feet (R26.81);Other abnormalities of gait and mobility (R26.89)   Activity Tolerance Patient tolerated treatment well   Patient Left in chair;with call bell/phone within reach (Son, Darlene Mclaughlin present in room)   Nurse Communication  (Rn notified of pt pain level at the start of the session)        Time: [phone] OT Time Calculation (min): 52 min  Charges: OT General Charges $OT Visit: 1 Visit OT Treatments $Self Care/Home Management : 38-52 mins    Darlene Mclaughlin Darlene Mclaughlin 05/23/2020, 4:37 PM

## 2020-05-23 NOTE — TOC Initial Note (Signed)
Transition of Care [facility]) - Initial/Assessment Note    Patient Details  Name: Darlene Mclaughlin MRN: [id number] Date of Birth: 1950-11-26  Transition of [facility]) CM/SW Contact:    Sharin Mons, RN Phone Number: 05/23/2020, 3:10 PM  Clinical Narrative:    Present with c/o severe right leg and back pain. From home alone. PTA independent with ADL's, no DME usage.        -s/p L2-3 L3-4 lateral lumbar interbody fusion, L4-5 L5-S1 XLIF,         3/30  Per PT's recommendation/evaluation: Home health PT, OT,  NCM shared with pt and son Gerald Stabs. Pt states agreeable to home health services. Pt states will d/c to son Brian's residence. Bobbye Morton (Son) [address], [place] , [place]    Choice offered for [facility] agency. Pt without preference. Referral made with [facility] and accepted. DME needs noted  Rolling walker, 3 in 1/BSC will be provided from floor stuck prior to d/c by nurse. Pt already has tub bench.  TOC team will continue to monitor and assist with TOC needs.....   Expected Discharge Plan: [place] (vs SNF) Barriers to Discharge: Continued Medical Work up   Patient Goals and CMS Choice        Expected Discharge Plan and Services Expected Discharge Plan: [place] (vs SNF)   Discharge Planning Services: CM Consult                                          Prior Living Arrangements/Services     Patient language and need for interpreter reviewed:: Yes        Need for Family Participation in Patient Care: Yes (Comment) Care giver support system in place?: Yes (comment)   Criminal Activity/Legal Involvement Pertinent to Current Situation/Hospitalization: No - Comment as needed  Activities of Daily Living Home Assistive Devices/Equipment: Cane (specify quad or straight) ADL Screening (condition at time of admission) Patient's cognitive ability adequate to safely complete daily activities?: Yes Is  the patient deaf or have difficulty hearing?: No Does the patient have difficulty seeing, even when wearing glasses/contacts?: No Does the patient have difficulty concentrating, remembering, or making decisions?: No Patient able to express need for assistance with ADLs?: Yes Does the patient have difficulty dressing or bathing?: No Independently performs ADLs?: No Communication: Independent Dressing (OT): Needs assistance Is this a change from baseline?: Change from baseline, expected to last >3 days Grooming: Needs assistance Is this a change from baseline?: Change from baseline, expected to last >3 days Feeding: Independent Bathing: Needs assistance Is this a change from baseline?: Change from baseline, expected to last >3 days Toileting: Needs assistance Is this a change from baseline?: Change from baseline, expected to last >3days In/Out Bed: Needs assistance Is this a change from baseline?: Change from baseline, expected to last >3 days Walks in Home: Needs assistance Is this a change from baseline?: Change from baseline, expected to last >3 days Does the patient have difficulty walking or climbing stairs?: Yes Weakness of Legs: Both Weakness of Arms/Hands: None  Permission Sought/Granted   Permission granted to share information with : Yes, Verbal Permission Granted  Share Information with NAME: Alexis Frock (Son) [phone]  Bobbye Morton [facility]) [phone]           Emotional Assessment Appearance:: Appears stated age  Attitude/Demeanor/Rapport: Engaged Affect (typically observed): Accepting Orientation: : Oriented to Self,Oriented to Place,Oriented to  Time,Oriented to Situation Alcohol / Substance Use: Not Applicable Psych Involvement: No (comment)  Admission diagnosis:  Lumbar radiculopathy [M54.16] Patient Active Problem List   Diagnosis Date Noted  . Lumbar radiculopathy 05/21/2020  . Statin intolerance 01/16/2018  . Bilateral leg edema 12/21/2017  .  History of colonic polyps 10/18/2017  . Dyslipidemia 10/18/2017  . Essential hypertension 03/26/2016  . Family history of colon cancer 03/26/2016  . Osteopenia 08/28/2015  . 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 Spinal stenosis of lumbar region 12/05/2013  . Morbid obesity ([place]) 12/19/2012  . Diabetes mellitus with coincident hypertension ([place]) 05/15/2008  . Allergic rhinitis 05/15/2008  . Asthma, mild intermittent 05/15/2008  . Osteoarthritis 05/15/2008  . HEADACHE 05/15/2008   PCP:  Isaac Bliss, Rayford Halsted, MD Pharmacy:   [id number] - [place], [address] [place] [address] Phone: [phone] Fax: [phone]  [id number] [place], [place] [address] [place] [address] Phone: [phone] Fax: [phone]     Social Determinants of Health (SDOH) Interventions    Readmission Risk Interventions No flowsheet data found.

## 2020-05-23 NOTE — Progress Notes (Signed)
   05/23/20 0802  Assess: MEWS Score  Temp 99.2 F (37.3 C)  BP 138/65  Pulse Rate (!) 116  Resp 18  Level of Consciousness Alert  SpO2 96 %  O2 Device Room Air  Assess: if the MEWS score is Yellow or Red  Were vital signs taken at a resting state? Yes  Focused Assessment No change from prior assessment  Early Detection of Sepsis Score *See Row Information* Low  Treat  MEWS Interventions Administered prn meds/treatments  Pain Scale 0-10  Pain Score 5  Take Vital Signs  Increase Vital Sign Frequency  Yellow: Q 2hr X 2 then Q 4hr X 2, if remains yellow, continue Q 4hrs  Escalate  MEWS: Escalate Yellow: discuss with charge nurse/RN and consider discussing with provider and RRT  Notify: Charge Nurse/RN  Name of Charge Nurse/RN Notified Megumi Treaster  Date Charge Nurse/RN Notified 05/23/20  Time Charge Nurse/RN Notified [id number]  Notify: Provider  Provider Name/Title Dr [name]  Date Provider Notified 05/23/20  Time Provider Notified [phone]  Notification Type Face-to-face  Notification Reason Other (Comment) (HR 116)  Provider response At bedside;No new orders  Date of Provider Response 05/23/20  Time of Provider Response [phone]  Notify: Rapid Response  Name of Rapid Response RN Notified na  Pt is A&O x4, in bed. Back pain noted. Prn meds for pain given.

## 2020-05-23 NOTE — Progress Notes (Signed)
Physical Therapy Treatment Patient Details Name: Darlene Mclaughlin MRN: [id number] DOB: [birth year] Today's Date: 05/23/2020    History of Present Illness 70 yo female s/p L2-3 L3-4 lateral lumbar interbody fusion, L4-5 L5-S1 XLIF on 3/30. PMH includes LE edema, HTN, osteopenia, DCIS R breast, obestity, DM, OA.    PT Comments    Pt was able to progress gait into the hallway with RW.  Her son is excellent at reinforcing correct mobility cues and keeping up with her back precautions as she struggles to remember them. She says she may d/c in AM, so I have requested PT come earlier in the morning so that she does not have to wait on therapy to d/c.     Follow Up Recommendations  Home health PT     Equipment Recommendations  Rolling walker with 5" wheels;3in1 (PT)    Recommendations for Other Services       Precautions / Restrictions Precautions Precautions: Back;Fall Precaution Booklet Issued: Yes (comment) Precaution Comments: reviewed BLT, first time pt could report 1/3, second time 2/3, son able to verbalize all of them. Required Braces or Orthoses: Spinal Brace Spinal Brace: Lumbar corset;Applied in sitting position (was up, so it was already donned) Restrictions Weight Bearing Restrictions: No Other Position/Activity Restrictions: No bending, lifting or twisting    Mobility  Bed Mobility Overal bed mobility: Needs Assistance Bed Mobility: Rolling;Sidelying to Sit Rolling: Min assist Sidelying to sit: Mod assist       General bed mobility comments: Pt was OOB in the recliner chair.  Got up with OT in the last hour.    Transfers Overall transfer level: Needs assistance Equipment used: Rolling walker (2 wheeled) Transfers: Sit to/from Stand Sit to Stand: Min assist         General transfer comment: Min assist to support trunk and stabilize RW during transitions.  Cues for safe hand placement give and reinforced by PT and son.  Ambulation/Gait Ambulation/Gait  assistance: Min guard Gait Distance (Feet): 75 Feet Assistive device: Rolling walker (2 wheeled) Gait Pattern/deviations: Step-through pattern;Decreased stride length;Trunk flexed Gait velocity: decr Gait velocity interpretation: <1.8 ft/sec, indicate of risk for recurrent falls General Gait Details: Pt needed min guard assist for safety cues for upright posture and closer proximity to RW, stay inside of RW and slow turns.  Son able to reinforce cues well.  We discussed the importance of upright sitting and standing posture to healing correctly.   Stairs Stairs:  (pt has no STE her home)           Wheelchair Mobility    Modified Rankin (Stroke Patients Only)       Balance Overall balance assessment: Needs assistance Sitting-balance support: Feet supported;Bilateral upper extremity supported Sitting balance-Leahy Scale: Good     Standing balance support: Bilateral upper extremity supported Standing balance-Leahy Scale: Poor Standing balance comment: reliant on external support                            Cognition Arousal/Alertness: Awake/alert Behavior During Therapy: WFL for tasks assessed/performed Overall Cognitive Status: Impaired/Different from baseline Area of Impairment: Memory                     Memory: Decreased recall of precautions         General Comments: Pt will need reinforcement of precautions.      Exercises      General Comments General comments (skin integrity, edema, etc.):  Discussed sleeping positions and pillow support, reinfoced back precautions x 2      Pertinent Vitals/Pain Pain Assessment: Faces Pain Score: 5  Faces Pain Scale: Hurts little more Pain Location: back Pain Descriptors / Indicators: Aching;Discomfort;Grimacing;Guarding Pain Intervention(s): Limited activity within patient's tolerance;Monitored during session;Repositioned;Premedicated before session    Home Living                       Prior Function            PT Goals (current goals can now be found in the care plan section) Acute Rehab PT Goals Patient Stated Goal: wants to be able to take care of herself Progress towards PT goals: Progressing toward goals    Frequency    Min 5X/week      PT Plan Current plan remains appropriate    Co-evaluation              AM-PAC PT "6 Clicks" Mobility   Outcome Measure  Help needed turning from your back to your side while in a flat bed without using bedrails?: A Little Help needed moving from lying on your back to sitting on the side of a flat bed without using bedrails?: A Little Help needed moving to and from a bed to a chair (including a wheelchair)?: A Little Help needed standing up from a chair using your arms (e.g., wheelchair or bedside chair)?: A Little Help needed to walk in hospital room?: A Little Help needed climbing 3-5 steps with a railing? : A Little 6 Click Score: 18    End of Session Equipment Utilized During Treatment: Back brace Activity Tolerance: Patient limited by pain Patient left: in chair;with call bell/phone within reach;with family/visitor present   PT Visit Diagnosis: Other abnormalities of gait and mobility (R26.89);Difficulty in walking, not elsewhere classified (R26.2)     Time: 6808-8110 PT Time Calculation (min) (ACUTE ONLY): 32 min  Charges:  $Gait Training: 8-22 mins $Self Care/Home Management: 8-22                     Verdene Lennert, PT, DPT  Acute Rehabilitation [phone] pager [phone]) [phone] office

## 2020-05-24 LAB — GLUCOSE, CAPILLARY
Glucose-Capillary: 164 mg/dL — ABNORMAL HIGH (ref 70–99)
Glucose-Capillary: 199 mg/dL — ABNORMAL HIGH (ref 70–99)
Glucose-Capillary: 147 mg/dL — ABNORMAL HIGH (ref 70–99)
Glucose-Capillary: 187 mg/dL — ABNORMAL HIGH (ref 70–99)

## 2020-05-24 NOTE — Progress Notes (Signed)
NEUROSURGERY PROGRESS NOTE  Postop day 3 multilevel lumbar fusion. She is doing ok. Still has quite a bit of back pain and feels her legs are generally weak. Has been working with therapy. We will see how she does with therapy today. Possibly home tomorrow   Temp:  [98.6 F (37 C)-99.7 F (37.6 C)] 99 F (37.2 C) (04/02 0743) Pulse Rate:  [92-109] 107 (04/02 0743) Resp:  [16-18] 17 (04/02 0743) BP: (89-133)/(59-73) 118/62 (04/02 0743) SpO2:  [96 %-100 %] 99 % (04/02 0743)   Eleonore Chiquito, NP 05/24/2020 8:58 AM

## 2020-05-24 NOTE — Progress Notes (Signed)
Physical Therapy Treatment Patient Details Name: Darlene Mclaughlin MRN: [id number] DOB: 11-16-1950 Today's Date: 05/24/2020    History of Present Illness 71 yo female s/p L2-3 L3-4 lateral lumbar interbody fusion, L4-5 L5-S1 XLIF on 3/30. PMH includes LE edema, HTN, osteopenia, DCIS R breast, obestity, DM, OA.    PT Comments    Pt is progressing well towards goals. She increased ambulation distance and focused on widening BOS during gait. Pt continues to struggle with STM, and is unable to recall 2/3 back precautions when prompted. Issued handout and left hanging in sight for pt to review. Son present and attentive to pt's needs during session. Will continue to follow acutely. Current plan remains appropriate.     Follow Up Recommendations  Home health PT     Equipment Recommendations  Rolling walker with 5" wheels;3in1 (PT)    Recommendations for Other Services       Precautions / Restrictions Precautions Precautions: Back;Fall Precaution Booklet Issued: Yes (comment) Precaution Comments: reviewed BLT, pt able to recall 1/3. Handout issued Required Braces or Orthoses: Spinal Brace Spinal Brace: Lumbar corset;Applied in sitting position (was up, so it was already donned) Restrictions Other Position/Activity Restrictions: No bending, lifting or twisting    Mobility  Bed Mobility Overal bed mobility: Needs Assistance Bed Mobility: Rolling;Sidelying to Sit Rolling: Min guard Sidelying to sit: Min assist     Sit to sidelying: Min assist General bed mobility comments: Min A for LE management on and off bed.    Transfers Overall transfer level: Needs assistance Equipment used: Rolling walker (2 wheeled) Transfers: Sit to/from Stand Sit to Stand: Min guard         General transfer comment: cues for hand placement to prevent twisting.  Ambulation/Gait Ambulation/Gait assistance: Min guard Gait Distance (Feet): 120 Feet Assistive device: Rolling walker (2 wheeled) Gait  Pattern/deviations: Step-through pattern;Decreased stride length;Narrow base of support;Trunk flexed;Decreased dorsiflexion - right Gait velocity: decr Gait velocity interpretation: <1.8 ft/sec, indicate of risk for recurrent falls General Gait Details: Min guard for safety. Pt dragging RLE and externally rotated. Cues for wider BOS and to prevent RLE from dragging.   Stairs             Wheelchair Mobility    Modified Rankin (Stroke Patients Only)       Balance Overall balance assessment: Needs assistance Sitting-balance support: Feet supported;Bilateral upper extremity supported Sitting balance-Leahy Scale: Good Sitting balance - Comments: able to sit EOB without PT assist   Standing balance support: Bilateral upper extremity supported Standing balance-Leahy Scale: Poor Standing balance comment: reliant on external support                            Cognition Arousal/Alertness: Awake/alert Behavior During Therapy: WFL for tasks assessed/performed Overall Cognitive Status: Impaired/Different from baseline Area of Impairment: Memory                     Memory: Decreased recall of precautions         General Comments: Pt will need reinforcement of precautions.      Exercises      General Comments        Pertinent Vitals/Pain Pain Assessment: Faces Faces Pain Scale: Hurts a little bit Pain Location: back, R LE Pain Descriptors / Indicators: Aching;Discomfort;Grimacing;Guarding Pain Intervention(s): Monitored during session;Limited activity within patient's tolerance    Home Living  Prior Function            PT Goals (current goals can now be found in the care plan section) Acute Rehab PT Goals Patient Stated Goal: wants to be able to take care of herself PT Goal Formulation: With patient Time For Goal Achievement: 05/30/20 Potential to Achieve Goals: Good Progress towards PT goals: Progressing toward  goals    Frequency    Min 5X/week      PT Plan Current plan remains appropriate    Co-evaluation              AM-PAC PT "6 Clicks" Mobility   Outcome Measure  Help needed turning from your back to your side while in a flat bed without using bedrails?: A Little Help needed moving from lying on your back to sitting on the side of a flat bed without using bedrails?: A Little Help needed moving to and from a bed to a chair (including a wheelchair)?: A Little Help needed standing up from a chair using your arms (e.g., wheelchair or bedside chair)?: A Little Help needed to walk in hospital room?: A Little Help needed climbing 3-5 steps with a railing? : A Little 6 Click Score: 18    End of Session Equipment Utilized During Treatment: Back brace Activity Tolerance: Patient tolerated treatment well Patient left: with call bell/phone within reach;with family/visitor present;in bed;with nursing/sitter in room Nurse Communication: Mobility status PT Visit Diagnosis: Other abnormalities of gait and mobility (R26.89);Difficulty in walking, not elsewhere classified (R26.2)     Time: 3338-3291 PT Time Calculation (min) (ACUTE ONLY): 21 min  Charges:  $Gait Training: 8-22 mins                     Benjiman Core, [place] Pager [phone] Acute Rehab  Allena Katz 05/24/2020, 11:04 AM

## 2020-05-25 LAB — GLUCOSE, CAPILLARY
Glucose-Capillary: 172 mg/dL — ABNORMAL HIGH (ref 70–99)
Glucose-Capillary: 135 mg/dL — ABNORMAL HIGH (ref 70–99)
Glucose-Capillary: 162 mg/dL — ABNORMAL HIGH (ref 70–99)
Glucose-Capillary: 144 mg/dL — ABNORMAL HIGH (ref 70–99)

## 2020-05-25 NOTE — Plan of Care (Signed)

## 2020-05-25 NOTE — Plan of Care (Signed)

## 2020-05-25 NOTE — Progress Notes (Signed)
NEUROSURGERY PROGRESS NOTE  Doing well. Complains of appropriate back soreness but feels much better today. She is sitting up in the chair. Therapy went well yesterday.  No numbness, tingling or weakness Ambulating and voiding well Good strength and sensation Incision CDI  Temp:  [98 F (36.7 C)-98.9 F (37.2 C)] 98.9 F (37.2 C) (04/03 0728) Pulse Rate:  [92-107] 93 (04/03 0728) Resp:  [14-18] 14 (04/03 0728) BP: (105-124)/(61-62) 105/62 (04/03 0728) SpO2:  [97 %-100 %] 98 % (04/03 0728)  Plan: She and her son both agreed that she could use one more day of therapy since she still needs assistance getting up. Will plan to discharge tomorrow.   Eleonore Chiquito, NP 05/25/2020 11:18 AM

## 2020-05-26 ENCOUNTER — Other Ambulatory Visit (HOSPITAL_COMMUNITY): Payer: Self-pay

## 2020-05-26 LAB — GLUCOSE, CAPILLARY
Glucose-Capillary: 163 mg/dL — ABNORMAL HIGH (ref 70–99)
Glucose-Capillary: 188 mg/dL — ABNORMAL HIGH (ref 70–99)

## 2020-05-26 MED ORDER — CYCLOBENZAPRINE HCL 10 MG PO TABS: 10.0000 mg | ORAL_TABLET | Freq: Three times a day (TID) | ORAL | 0 refills | Status: AC | PRN

## 2020-05-26 MED ORDER — OXYCODONE-ACETAMINOPHEN 5-325 MG PO TABS: 1.0000 | ORAL_TABLET | Freq: Four times a day (QID) | ORAL | 0 refills | Status: DC | PRN

## 2020-05-26 MED ORDER — DOCUSATE SODIUM 100 MG PO CAPS: 100.0000 mg | ORAL_CAPSULE | Freq: Two times a day (BID) | ORAL | 2 refills | Status: DC

## 2020-05-26 NOTE — Care Management Important Message (Signed)
Important Message  Patient Details  Name: Darlene Mclaughlin MRN: [id number] Date of Birth: September 21, 1950   Medicare Important Message Given:  Yes     [name] P Haylen Shelnutt 05/26/2020, 3:01 PM

## 2020-05-26 NOTE — Progress Notes (Signed)
Physical Therapy Treatment Patient Details Name: Darlene Mclaughlin MRN: [id number] DOB: 1951/02/02 Today's Date: 05/26/2020    History of Present Illness 70 yo female s/p L2-3 L3-4 lateral lumbar interbody fusion, L4-5 L5-S1 XLIF on 3/30. PMH includes LE edema, HTN, osteopenia, DCIS R breast, obestity, DM, OA.    PT Comments    Pt was able to demonstrate log roll in/out of bed simulating home, walked significantly further than when I saw her on Friday, and self cue for good posture, close proximity to RW during gait.  Son in room for repeat education and back precaution handout there.  PT to follow acutely until d/c confirmed.     Follow Up Recommendations  Home health PT     Equipment Recommendations  Rolling walker with 5" wheels;3in1 (PT)    Recommendations for Other Services       Precautions / Restrictions Precautions Precautions: Back;Fall Precaution Booklet Issued: Yes (comment) Required Braces or Orthoses: Spinal Brace Spinal Brace: Lumbar corset;Applied in sitting position Restrictions Other Position/Activity Restrictions: No bending, lifting or twisting    Mobility  Bed Mobility Overal bed mobility: Needs Assistance Bed Mobility: Rolling;Sidelying to Sit;Sit to Sidelying Rolling: Supervision Sidelying to sit: Supervision     Sit to sidelying: Supervision General bed mobility comments: Practiced log roll and in/out of bed on R side simulating her bed at home with HOB flat and no rails (simulating normal non-hospital bed).  Supervision for safety, cues to keep hands in front of her (not to twist and reach behind).    Transfers Overall transfer level: Needs assistance Equipment used: Rolling walker (2 wheeled) Transfers: Sit to/from Stand Sit to Stand: Supervision         General transfer comment: supervision for safety  Ambulation/Gait Ambulation/Gait assistance: Supervision Gait Distance (Feet): 130 Feet Assistive device: Rolling walker (2 wheeled) Gait  Pattern/deviations: Step-through pattern;Decreased stride length;Narrow base of support;Trunk flexed;Decreased dorsiflexion - right     General Gait Details: supervision for safety, pt able to teach back cues for safe RW use (upright posture, feet between handle bars).   Stairs             Wheelchair Mobility    Modified Rankin (Stroke Patients Only)       Balance Overall balance assessment: Needs assistance Sitting-balance support: Feet supported;No upper extremity supported Sitting balance-Leahy Scale: Good     Standing balance support: Bilateral upper extremity supported;Single extremity supported Standing balance-Leahy Scale: Poor Standing balance comment: needs at least one hand support in standing for balance and stability.                            Cognition Arousal/Alertness: Awake/alert Behavior During Therapy: WFL for tasks assessed/performed Overall Cognitive Status: Within Functional Limits for tasks assessed                                        Exercises      General Comments        Pertinent Vitals/Pain Pain Assessment: Faces Faces Pain Scale: Hurts little more Pain Location: Back Pain Descriptors / Indicators: Grimacing;Guarding Pain Intervention(s): Limited activity within patient's tolerance;Monitored during session;Repositioned    Home Living                      Prior Function  PT Goals (current goals can now be found in the care plan section) Acute Rehab PT Goals Patient Stated Goal: wants to be able to take care of herself Progress towards PT goals: Progressing toward goals    Frequency    Min 5X/week      PT Plan Current plan remains appropriate    Co-evaluation              AM-PAC PT "6 Clicks" Mobility   Outcome Measure  Help needed turning from your back to your side while in a flat bed without using bedrails?: A Little Help needed moving from lying on your  back to sitting on the side of a flat bed without using bedrails?: A Little Help needed moving to and from a bed to a chair (including a wheelchair)?: A Little Help needed standing up from a chair using your arms (e.g., wheelchair or bedside chair)?: A Little Help needed to walk in hospital room?: A Little Help needed climbing 3-5 steps with a railing? : A Little 6 Click Score: 18    End of Session Equipment Utilized During Treatment: Back brace Activity Tolerance: Patient tolerated treatment well Patient left: in bed;with call bell/phone within reach;with family/visitor present Nurse Communication: Mobility status PT Visit Diagnosis: Other abnormalities of gait and mobility (R26.89);Difficulty in walking, not elsewhere classified (R26.2)     Time: 1239-1300 PT Time Calculation (min) (ACUTE ONLY): 21 min  Charges:  $Gait Training: 8-22 mins                     Verdene Lennert, PT, DPT  Acute Rehabilitation [phone] pager [phone]) [phone] office

## 2020-05-26 NOTE — Discharge Instructions (Signed)
Wear LSO brace when OOB Walk as much as possible No heavy lifting >10 lbs No excessive bending/twisting at the waist

## 2020-05-26 NOTE — Discharge Summary (Signed)
Physician Discharge Summary  Patient ID: Darlene Mclaughlin MRN: [id number] DOB/AGE: 72/17/1952 72 y.o.  Admit date: 05/21/2020 Discharge date: 05/26/2020  Admission Diagnoses:  Lumbar radiculopathy  Discharge Diagnoses:  Same Active Problems:   Lumbar radiculopathy   Discharged Condition: Stable  Hospital Course:  Darlene Mclaughlin is a 70 y.o. female who presented with severe back pain and right leg pain and foot drop.  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  She underwent elective L2-S1 fusion and was admitted to the hospital for postoperative care.  Over the next 5 days, her diet was advanced, she was mobilized with physical therapy and Occupational Therapy, who recommended home therapies.  She reported her preoperative severe right leg pains had resolved and her pain continue to improve each day.  On the day of discharge, her pain was well controlled with p.o. pain medications, she is voiding without difficulty and ambulating well.  She was deemed ready for discharge home into her son's care.  Treatments: Surgery -L2-3, L3-4 left DLIF, L4-5, L5-S1 minimally invasive TLIFs, percutaneous L2-S1 posterior instrumentation  Discharge Exam: Blood pressure (!) 115/58, pulse 85, temperature 98.6 F (37 C), temperature source Oral, resp. rate 16, height 5' 4"  (1.626 m), weight 102.5 kg, SpO2 98 %. Awake, alert, oriented Speech fluent, appropriate CN grossly intact Right dorsiflexion 2/5, EHL 1/5, otherwise 5/5 Wounds c/d/i  Disposition: Discharge disposition: 01-Home or Self Care       Discharge Instructions    Incentive spirometry RT   Complete by: As directed      Allergies as of 05/26/2020      Reactions   Atorvastatin    Hallucinations    Hydrochlorothiazide    Muscle pain   Macrobid [nitrofurantoin  Monohyd Macro]     Liver problems   Naproxen Swelling   Mouth and tongue swelling   Simvastatin    Hallucinations, muscle pains   Zanaflex [tizanidine Hcl] Other (See Comments)    Liver problems      Medication List    STOP taking these medications   traMADol 50 MG tablet Commonly known as: ULTRAM     TAKE these medications   Accu-Chek FastClix Lancets Misc 1 EACH BY DOES NOT APPLY ROUTE DAILY. USE FOR ACCU-CHEK GUIDE   Accu-Chek Guide test strip Generic drug: glucose blood 1 EACH BY OTHER ROUTE DAILY.   acetaminophen 650 MG CR tablet Commonly known as: TYLENOL Take 650 mg by mouth every 8 (eight) hours as needed for pain.   ARNICA EX Apply 1 application topically daily as needed (pain). Similasan Arnica Active pain relieving spray   aspirin 81 MG tablet Take 81 mg by mouth daily.   azelastine 0.05 % ophthalmic solution Commonly known as: OPTIVAR Place 1 drop into both eyes daily as needed (allergies).   blood glucose meter kit and supplies Kit Dispense based on patient and insurance preference. Use up to twice daily as directed.   CALCIUM PO Take 1,000 mg by mouth daily.   cetirizine 10 MG tablet Commonly known as: ZYRTEC Take 10 mg by mouth daily as needed for allergies.   chlorthalidone 25 MG tablet Commonly known as: HYGROTON TAKE 1 TABLET BY MOUTH EVERY DAY   cyclobenzaprine 10 MG tablet Commonly known as: FLEXERIL Take 1 tablet (10 mg total) by mouth 3 (three) times daily as needed for muscle spasms.  docusate sodium 100 MG capsule Commonly known as: COLACE Take 1 capsule (100 mg total) by mouth 2 (two) times daily.   EpiPen 2-Pak 0.3 mg/0.3 mL Soaj injection Generic drug: EPINEPHrine Inject 0.3 mg into the muscle as needed for anaphylaxis.   fluticasone 50 MCG/ACT nasal spray Commonly known as: FLONASE Place 1 spray into both nostrils daily. What changed:   when to take this  reasons to take this   GAS-X PO Take 1 tablet by mouth  daily as needed (gas).   Melatonin 10 MG Tabs Take 10 mg by mouth at bedtime as needed (sleep).   metFORMIN 500 MG 24 hr tablet Commonly known as: GLUCOPHAGE-XR TAKE 2 TABLETS (1,000 MG TOTAL) BY MOUTH DAILY WITH SUPPER. What changed: See the new instructions.   multivitamin tablet Take 1 tablet by mouth daily.   NON FORMULARY EVERY OTHER WEEK ALLERGY INJECTIONS   OVER THE COUNTER MEDICATION Apply 1 application topically daily as needed (pain). KT recovery pain relieving gel   oxyCODONE-acetaminophen 5-325 MG tablet Commonly known as: PERCOCET/ROXICET Take 1-2 tablets by mouth every 6 (six) hours as needed for moderate pain or severe pain.   potassium chloride SA 20 MEQ tablet Commonly known as: Klor-Con M20 Take 1 tablet (20 mEq total) by mouth 2 (two) times daily.   ProAir HFA 108 (90 Base) MCG/ACT inhaler Generic drug: albuterol Inhale 1 puff into the lungs every 6 (six) hours as needed for shortness of breath or wheezing.            Durable Medical Equipment  (From admission, onward)         Start     Ordered   05/24/20 1651  For home use only DME Walker rolling  Once       Question Answer Comment  Walker: With 5 Inch Wheels   Patient needs a walker to treat with the following condition Weakness      05/24/20 1653   05/24/20 1651  For home use only DME Bedside commode  Once       Question:  Patient needs a bedside commode to treat with the following condition  Answer:  Weakness   05/24/20 1653          Follow-up Information    [facility] Follow up.   Specialty: [place] Why: home health services will be provided by [facility], start of care will begin with 48 hrs post discharge Contact information: [address] [place] [address] [phone]        [facility] .        Vallarie Mare, MD Follow up in 2 week(s).   Specialty: Neurosurgery Contact information: [address] [place] [address] [phone]               Signed: Vallarie Mare 05/26/2020, 8:42 AM

## 2020-05-26 NOTE — Progress Notes (Signed)
Pt given discharge instructions and prescriptions and gone over with her. She verbalized understanding. Pt given walker and bedside commode to take home. Pt in no distress at discharge.

## 2020-05-26 NOTE — Progress Notes (Signed)
Occupational Therapy Treatment Patient Details Name: Darlene Mclaughlin MRN: [id number] DOB: November 24, 1950 Today's Date: 05/26/2020    History of present illness 70 yo female s/p L2-3 L3-4 lateral lumbar interbody fusion, L4-5 L5-S1 XLIF on 3/30. PMH includes LE edema, HTN, osteopenia, DCIS R breast, obestity, DM, OA.   OT comments  Pt son reported that pt will have 24/7 care at home, and verbalized great understanding of home fall prevention strategies and AE/DME for ADL tasks. Pt completed all functional mobility with min guard and max vc for safety and back precautions. Pt reported burning/aching pain on left breast, pt notified RN and was educated to call her PCP if pain persists after d/c. Continue to recommend home health OT for continued increase in safety and independence towards ADLs.   Follow Up Recommendations  Home health OT;Supervision - Intermittent    Equipment Recommendations  3 in 1 bedside commode       Precautions / Restrictions Precautions Precautions: Back;Fall Precaution Comments: Reviewed BLT, able to recall 3/3 Required Braces or Orthoses: Spinal Brace Spinal Brace: Lumbar corset;Applied in sitting position Restrictions Weight Bearing Restrictions: No Other Position/Activity Restrictions: No bending, lifting or twisting       Mobility Bed Mobility Overal bed mobility: Needs Assistance Bed Mobility: Rolling;Sidelying to Sit Rolling: Min guard Sidelying to sit: Min assist       General bed mobility comments: Min A for LE management and log rolling    Transfers Overall transfer level: Needs assistance Equipment used: Rolling walker (2 wheeled) Transfers: Sit to/from Stand Sit to Stand: Min guard              Balance Overall balance assessment: Needs assistance Sitting-balance support: Feet supported Sitting balance-Leahy Scale: Good     Standing balance support: Bilateral upper extremity supported Standing balance-Leahy Scale: Poor Standing  balance comment: reliant on external support                           ADL either performed or assessed with clinical judgement   ADL Overall ADL's : Needs assistance/impaired     Grooming: Wash/dry hands;Supervision/safety;Min guard;Cueing for safety Grooming Details (indicate cue type and reason): Pt washed hands while standing at the sink with RW, required min guard A for safety         Upper Body Dressing : Cueing for safety;Sitting;Maximal assistance Upper Body Dressing Details (indicate cue type and reason): Assist to donn/doff brace (Pt's son educated on how to don brace)     Toilet Transfer: Min guard;Cueing for safety;Ambulation;RW   Toileting- Clothing Manipulation and Hygiene: Maximal assistance;Cueing for safety Toileting - Clothing Manipulation Details (indicate cue type and reason): Pt requires max vc to adhere to no twisting during hygiene     Functional mobility during ADLs: Min guard;Rolling walker;Cueing for safety General ADL Comments: Pt and pt son educated on AE/DME, fall prevention (remove throw rugs, add path lighting, put everyday supplies at waist to cest height, add bag to RW for carrying), pt continutes to requrie vc for safety and to adhere to back precautions during functional mobility and ADLs.               Cognition Arousal/Alertness: Awake/alert Behavior During Therapy: WFL for tasks assessed/performed Overall Cognitive Status: Within Functional Limits for tasks assessed  General Comments pt reported pain/burning sensation in L breast area, pt notified RN. No reddness, skin tear or bruising present .    Pertinent Vitals/ Pain       Pain Assessment: 0-10 Pain Score: 5  Pain Location: Back Pain Descriptors / Indicators: Constant;Grimacing;Guarding Pain Intervention(s):  (Rn admministed pain medications prior to session)  Home Living                                           Prior Functioning/Environment              Frequency  Min 2X/week        Progress Toward Goals  OT Goals(current goals can now be found in the care plan section)  Progress towards OT goals: Progressing toward goals  Acute Rehab OT Goals Patient Stated Goal: wants to be able to take care of herself OT Goal Formulation: With patient Time For Goal Achievement: 06/05/20 Potential to Achieve Goals: Good  Plan Discharge plan remains appropriate       AM-PAC OT "6 Clicks" Daily Activity     Outcome Measure   Help from another person eating meals?: A Little Help from another person taking care of personal grooming?: A Little Help from another person toileting, which includes using toliet, bedpan, or urinal?: A Lot Help from another person bathing (including washing, rinsing, drying)?: A Lot Help from another person to put on and taking off regular upper body clothing?: A Lot Help from another person to put on and taking off regular lower body clothing?: A Little 6 Click Score: 15    End of Session Equipment Utilized During Treatment: Gait belt;Rolling walker;Back brace  OT Visit Diagnosis: Unsteadiness on feet (R26.81);Other abnormalities of gait and mobility (R26.89)   Activity Tolerance Patient tolerated treatment well   Patient Left in chair;with call bell/phone within reach (Son present in room)   Nurse Communication Other (comment) (d/c needs and left breast pain)        Time: 1610-9604 OT Time Calculation (min): 37 min  Charges: OT General Charges $OT Visit: 1 Visit OT Treatments $Self Care/Home Management : 23-37 mins    Kellis Topete A Gerilynn Mccullars 05/26/2020, 10:49 AM

## 2020-05-26 NOTE — TOC Transition Note (Signed)
Transition of Care [facility]) - CM/SW Discharge Note   Patient Details  Name: Darlene Mclaughlin MRN: [id number] Date of Birth: [birth year]  Transition of Care [facility]) CM/SW Contact:  Sharin Mons, RN Phone Number: 05/26/2020, 12:00 PM   Clinical Narrative:    Patient will DC to: home  Anticipated DC date: 05/26/2020 Family notified: son, Darlene Mclaughlin Transport by: car  -s/p L2-3 L3-4 lateral lumbar interbody fusion, L4-5 L5-S1 XLIF       Per MD patient ready for DC today.RN, patient, and  patient's family notified of  of DC. [facility] made aware of d/c plan.   DME ( ROLLING WALKER, 3 in 1/BSC) will be provided by nurse from floor stock prior to d/c.  Pt without Rx med concerns. Post hospital f/u noted on AVS.  Son Darlene Mclaughlin to provide transportation to home.  RNCM will sign off for now as intervention is no longer needed. Please consult [place] again if new needs arise.    Final next level of care: Home w Home Health Services Barriers to Discharge: No Barriers Identified   Patient Goals and CMS Choice     Choice offered to / list presented to : Patient  Discharge Placement                       Discharge Plan and Services   Discharge Planning Services: CM Consult            DME Arranged: 3-N-1,Walker rolling         HH Arranged: PT,OT [place]: [place] Date [facility] Agency Contacted: 05/26/20 Time [place]: 1601 Representative spoke with at [place]: [place] ([place]) Interventions     Readmission Risk Interventions No flowsheet data found.

## 2020-05-26 NOTE — Plan of Care (Signed)
  Problem: Health Behavior/Discharge Planning: Goal: Ability to manage health-related needs will improve Outcome: Adequate for Discharge   Problem: Activity: Goal: Risk for activity intolerance will decrease Outcome: Adequate for Discharge

## 2020-07-07 DIAGNOSIS — R030 Elevated blood-pressure reading, without diagnosis of hypertension: Secondary | ICD-10-CM | POA: Diagnosis not present

## 2020-07-07 DIAGNOSIS — M5416 Radiculopathy, lumbar region: Secondary | ICD-10-CM | POA: Diagnosis not present

## 2020-07-07 DIAGNOSIS — Z6837 Body mass index (BMI) 37.0-37.9, adult: Secondary | ICD-10-CM | POA: Diagnosis not present

## 2020-07-08 ENCOUNTER — Other Ambulatory Visit: Payer: Self-pay | Admitting: Internal Medicine

## 2020-07-10 ENCOUNTER — Other Ambulatory Visit: Payer: Self-pay | Admitting: Internal Medicine

## 2020-07-23 ENCOUNTER — Ambulatory Visit: Payer: Medicare PPO | Admitting: Internal Medicine

## 2020-07-31 ENCOUNTER — Other Ambulatory Visit: Payer: Self-pay | Admitting: Internal Medicine

## 2020-08-13 DIAGNOSIS — J3089 Other allergic rhinitis: Secondary | ICD-10-CM | POA: Diagnosis not present

## 2020-08-13 DIAGNOSIS — J301 Allergic rhinitis due to pollen: Secondary | ICD-10-CM | POA: Diagnosis not present

## 2020-08-13 DIAGNOSIS — H1045 Other chronic allergic conjunctivitis: Secondary | ICD-10-CM | POA: Diagnosis not present

## 2020-08-13 DIAGNOSIS — J4520 Mild intermittent asthma, uncomplicated: Secondary | ICD-10-CM | POA: Diagnosis not present

## 2020-08-18 DIAGNOSIS — I10 Essential (primary) hypertension: Secondary | ICD-10-CM | POA: Diagnosis not present

## 2020-08-18 DIAGNOSIS — M5416 Radiculopathy, lumbar region: Secondary | ICD-10-CM | POA: Diagnosis not present

## 2020-08-18 DIAGNOSIS — Z6836 Body mass index (BMI) 36.0-36.9, adult: Secondary | ICD-10-CM | POA: Diagnosis not present

## 2020-08-28 DIAGNOSIS — J3089 Other allergic rhinitis: Secondary | ICD-10-CM | POA: Diagnosis not present

## 2020-08-28 DIAGNOSIS — J301 Allergic rhinitis due to pollen: Secondary | ICD-10-CM | POA: Diagnosis not present

## 2020-09-12 DIAGNOSIS — J3089 Other allergic rhinitis: Secondary | ICD-10-CM | POA: Diagnosis not present

## 2020-09-12 DIAGNOSIS — J301 Allergic rhinitis due to pollen: Secondary | ICD-10-CM | POA: Diagnosis not present

## 2020-09-18 ENCOUNTER — Ambulatory Visit: Payer: Medicare PPO | Attending: Neurosurgery | Admitting: Physical Therapy

## 2020-09-18 ENCOUNTER — Encounter: Payer: Self-pay | Admitting: Physical Therapy

## 2020-09-18 ENCOUNTER — Other Ambulatory Visit: Payer: Self-pay

## 2020-09-18 DIAGNOSIS — J301 Allergic rhinitis due to pollen: Secondary | ICD-10-CM | POA: Diagnosis not present

## 2020-09-18 DIAGNOSIS — G8929 Other chronic pain: Secondary | ICD-10-CM | POA: Diagnosis not present

## 2020-09-18 DIAGNOSIS — R2689 Other abnormalities of gait and mobility: Secondary | ICD-10-CM | POA: Diagnosis not present

## 2020-09-18 DIAGNOSIS — M6281 Muscle weakness (generalized): Secondary | ICD-10-CM | POA: Diagnosis not present

## 2020-09-18 DIAGNOSIS — M545 Low back pain, unspecified: Secondary | ICD-10-CM | POA: Insufficient documentation

## 2020-09-18 DIAGNOSIS — J3089 Other allergic rhinitis: Secondary | ICD-10-CM | POA: Diagnosis not present

## 2020-09-18 NOTE — Patient Instructions (Signed)
Access Code: [facility] URL: [email or web address] Date: 09/18/2020 Prepared by: Shearon Balo  Exercises Seated Sciatic Tensioner - 2 x daily - 7 x weekly - 20 reps Romberg Stance - 1 x daily - 7 x weekly - 1 sets - 3 reps - 30 hold Seated Transversus Abdominis Bracing - 1 x daily - 7 x weekly - 2 sets - 10 reps - 5 hold

## 2020-09-18 NOTE — Therapy (Addendum)
[place], [place], [address] Phone: [phone]   Fax:  [phone]  Physical Therapy Evaluation  Patient Details  Name: Darlene Mclaughlin MRN: [id number] Date of Birth: 05-19-1950 Referring Provider (PT): Vallarie Mare, MD   Encounter Date: 09/18/2020   PT End of Session - 09/18/20 1209     Visit Number 1    Number of Visits 16    Date for PT Re-Evaluation 11/19/20    Authorization Type [employer] MCR    PT Start Time 1104    PT Stop Time 1150    PT Time Calculation (min) 46 min    Activity Tolerance Patient tolerated treatment well    Behavior During Therapy [facility] for tasks assessed/performed             Past Medical History:  Diagnosis Date   ALLERGIC RHINITIS 05/15/2008   Anemia    ASTHMA 05/15/2008   Asthma    Blood transfusion without reported diagnosis 1991   anemia   Breast cancer ([place]) 2016   right breast   Cancer ([place])    DCIS right breast   Diabetes mellitus without complication ([place])    GERD (gastroesophageal reflux disease)    Headache(784.0) 05/15/2008   HYPERTENSION 05/15/2008   IMPAIRED GLUCOSE TOLERANCE 05/15/2008   Obesity    OSTEOARTHRITIS 05/15/2008   back    Past Surgical History:  Procedure Laterality Date   ABDOMINAL HYSTERECTOMY     ANTERIOR LAT LUMBAR FUSION Left 05/21/2020   Procedure: LUMBAR TWO-THREE, LUMBAR THREE-FOUR DIRECT LATERAL LUMBAR INTERBODY FUSION;  Surgeon: Vallarie Mare, MD;  Location: [place];  Service: Neurosurgery;  Laterality: Left;   APPLICATION OF ROBOTIC ASSISTANCE FOR SPINAL PROCEDURE N/A 05/21/2020   Procedure: APPLICATION OF ROBOTIC ASSISTANCE FOR SPINAL PROCEDURE;  Surgeon: Vallarie Mare, MD;  Location: [place];  Service: Neurosurgery;  Laterality: N/A;   BREAST LUMPECTOMY Right 12/17/2014   BREAST LUMPECTOMY WITH RADIOACTIVE SEED LOCALIZATION Right 12/17/2014   Procedure: RIGHT BREAST RADIOACTIVE SEED GUIDED PARTIAL MASTECTOMY;   Surgeon: Erroll Luna, MD;  Location: [place];  Service: General;  Laterality: Right;   CESAREAN SECTION     KNEE SURGERY     arthroscopic left   TRANSFORAMINAL LUMBAR INTERBODY FUSION W/ MIS 2 LEVEL Right 05/21/2020   Procedure: LUMBAR FOUR-FIVE, LUMBAR FIVE-SACRAL ONE MINIMALLY INVASIVE TRANSFORAMINAL LUMBAR INTERBODY FUSION WITH INSTRUMENTATION LUMBAR TWO-SACRAL ONE;  Surgeon: Vallarie Mare, MD;  Location: [place];  Service: Neurosurgery;  Laterality: Right;            There were no vitals filed for this visit.    Subjective Assessment - 09/18/20 1125     Subjective Darlene Mclaughlin is a 70 y.o. female who presents to clinic with chief complaint of weakness of R LE which results in gait instability.  MOI/History of condition: History of radicular sxs in bil feet which slowly increased (starting February of 2022), particularly in R LE.  This prevented ADLs such as cooking and standing.  Underwent multilevel lumbar fusion at end of March of 2022.  Pain location: R sided low back pian Red flags: denies bb changes and n/t before surgery.  48 hour pain intensity:  highest 8/10, current 6/10, best 3/10.  Aggs: rainy weather, inactivity, too much activity.  Eases: rest, heat.  Nature: burning ache.  Severity: mod/high  Irritability: mod.    Stage: chronic.  Stability: getting better.  24 hour pattern: worse in moreing.  Vocation/requirements:  none.  Hobbies: water aerobics and exercise classes.  Functional limitations/goals: water aerobics and exercise classes, learn exercises for home management.  Home environment: lives alone (currently with son), 2 STE, single story house.  Assistive device: SPC, none at baseline.   Hand dominance: R.  Falls: yes.  Referring provider: Vallarie Mare, MD    Pertinent History lumbar fusion late March 2022, DM TII, hx of breast cancer                [facility] PT Assessment - 09/18/20 0001       Assessment   Medical Diagnosis  Radiculopathy, lumbar region (M54.16)    Referring Provider (PT) Vallarie Mare, MD    Onset Date/Surgical Date 05/21/20    Hand Dominance Right    Next MD Visit October    Prior Therapy none      Precautions   Precaution Comments lumbar fusion late March 2022, DM TII, hx of breast cancer      Restrictions   Other Position/Activity Restrictions lumbar fusion late March 2022 - per pt 20# lifting limit - per referral no ROM (will check)      Balance Screen   Has the patient fallen in the past 6 months Yes    How many times? 2    Has the patient had a decrease in activity level because of a fear of falling?  Yes    Is the patient reluctant to leave their home because of a fear of falling?  No      Observation/Other Assessments   Observations Functional R LE weakness in gait during all phases, noticable lack of R toe clearance in swing    Focus on Therapeutic Outcomes (FOTO)  48 -> 52      Functional Tests   Functional tests Other;Other2      Other:   Other/ Comments 70'' STS:  7x      Other:   Other/Comments progressive balance test: semi tandem <10''      ROM / Strength   AROM / PROM / Strength Strength      Ambulation/Gait   Gait velocity .625 m/s over 10 m                        Objective measurements completed on examination: See above findings.               PT Education - 09/18/20 1126     Education Details POC, diagnosis, prognosis, HEP.  Pt educated via explanation, demonstration, and handout (HEP).  Pt confirms understanding verbally.              PT Short Term Goals - 09/18/20 1158       PT SHORT TERM GOAL #1   Title Darlene Mclaughlin will be >75% HEP compliant within 3 weeks to improve carryover between sessions and facilitate independent management of condition.    Target Date 10/09/20               PT Long Term Goals - 09/18/20 1159       PT LONG TERM GOAL #1   Title Darlene Mclaughlin will improve FOTO score  from 48 (on eval) to 52 as a proxy for functional improvement    Target Date 11/19/20      PT LONG TERM GOAL #2   Title Darlene Mclaughlin will be able to return to water aerobics, not limited by pain  EVAL: not able  Target Date 11/19/20      PT LONG TERM GOAL #3   Title Darlene Mclaughlin will improve max gait speed to .9 m/s (.1 m/s MCID) to show functional improvement in ambulation  EVAL: .625 m/s    Target Date 11/19/20      PT LONG TERM GOAL #4   Title Darlene Mclaughlin will improve 30'' STS (MCID 2) to >/= 10x to show improved LE strength and improved transfers  EVAL: 7x    Target Date 11/19/20      PT LONG TERM GOAL #5   Title Darlene Mclaughlin will be able to stand for >30'' in semi tandem stance, to show a significant improvement in balance in order to reduce fall risk  EVAL: <10'' in semi-tandem stance bil    Target Date 11/19/20                    Plan - 09/18/20 1153     Clinical Impression Statement Darlene Mclaughlin is a 70 y.o. female who presents to clinic with signs and sxs consistent with R LE weakness secondary to neural compression which was treated operatively in late march 2022 via lumbar fusion; she has concurrent R sided low back pain but this is not her CC.  Pt presents with pain and impairments/deficits in: gait speed, gait, balance, functional LE strength.  Activity limitations include: squatting, longer distance ambulation, lifting, bending .  Participation limitations include: Cooking, housework, recreation (water aerobics).  Pt will benefit from skilled therapy to address pain and the listed deficits in order to achieve functional goals, enable safety and independence in completion of daily tasks, and return to PLOF.    Personal Factors and Comorbidities Comorbidity 2    Comorbidities See "Pertinent History" in "Subjective Assessment"  and/or precautions    Stability/Clinical Decision Making Stable/Uncomplicated    Clinical Decision Making Low     Rehab Potential Good    PT Frequency 2x / week    PT Duration 8 weeks    PT Treatment/Interventions --   ADLs/Self Care Home Management;Aquatic Therapy;Therapeutic activities;Therapeutic exercise;Neuromuscular re-education;Manual techniques;Iontophoresis '4mg'$ /ml Dexamethasone;Dry needling;Gait training   PT Next Visit Plan MMT LE, balance progression, core strength progression, LE strength progression    PT Home Exercise Plan [id number]    Consulted and Agree with Plan of Care Patient             Patient will benefit from skilled therapeutic intervention in order to improve the following deficits and impairments:     Visit Diagnosis: Muscle weakness (generalized) - Plan: PT plan of care cert/re-cert  Other abnormalities of gait and mobility - Plan: PT plan of care cert/re-cert  Chronic right-sided low back pain, unspecified whether sciatica present - Plan: PT plan of care cert/re-cert     Problem List Patient Active Problem List   Diagnosis Date Noted   Lumbar radiculopathy 05/21/2020   Statin intolerance 01/16/2018   Bilateral leg edema 12/21/2017   History of colonic polyps 10/18/2017   Dyslipidemia 10/18/2017   Essential hypertension 03/26/2016   Family history of colon cancer 03/26/2016   Osteopenia 08/28/2015   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 Radiculopathy, lumbar region (M54.16)  Treatment  diagnosis? (if different than referring diagnosis)  What was this (referring dx) caused by? '[x]'$  Surgery '[]'$  Fall '[]'$  Ongoing issue '[]'$  Arthritis '[]'$  Other: ____________  Laterality: '[x]'$  Rt '[]'$  Lt '[]'$  Both  Check all possible CPT codes:      '[x]'$  97110 (Therapeutic Exercise)  '[]'$  92507 (SLP Treatment)  '[x]'$  28413 (Neuro Re-ed)   '[]'$  92526 (Swallowing Treatment)   '[x]'$  97116 (Gait Training)   '[]'$  [id number] (Cognitive Training, 1st 15 minutes) '[x]'$  97140 (Manual Therapy)   '[]'$  97130 (Cognitive Training, each add'l 15 minutes)  '[x]'$  97530 (Therapeutic Activities)  '[]'$  Other, List CPT Code ____________    '[x]'$  [id number] (Self Care)       '[]'$  All codes above (97110 - 97535)  '[]'$  97012 (Mechanical Traction)  '[]'$  97014 (E-stim Unattended)  '[]'$  97032 (E-stim manual)  '[]'$  97033 (Ionto)  '[]'$  97035 (Ultrasound)  '[]'$  97760 (Orthotic Fit) '[]'$  [id number] (Physical Performance Training) '[]'$  [id number] (Aquatic Therapy) '[]'$  97034 (Contrast Bath) '[]'$  [id number] (Paraffin) '[]'$  97597 (Wound Care 1st 20 sq cm) '[]'$  97598 (Wound Care each add'l 20 sq cm) '[]'$  97016 (Vasopneumatic Device) '[]'$  [id number] Comptroller) '[]'$  [id number] (Prosthetic Training)

## 2020-09-23 ENCOUNTER — Encounter: Payer: Self-pay | Admitting: Physical Therapy

## 2020-09-23 ENCOUNTER — Other Ambulatory Visit: Payer: Self-pay

## 2020-09-23 ENCOUNTER — Ambulatory Visit: Payer: Medicare PPO | Attending: Neurosurgery | Admitting: Physical Therapy

## 2020-09-23 ENCOUNTER — Other Ambulatory Visit: Payer: Self-pay | Admitting: Internal Medicine

## 2020-09-23 DIAGNOSIS — M6281 Muscle weakness (generalized): Secondary | ICD-10-CM | POA: Diagnosis not present

## 2020-09-23 DIAGNOSIS — G8929 Other chronic pain: Secondary | ICD-10-CM | POA: Diagnosis not present

## 2020-09-23 DIAGNOSIS — M545 Low back pain, unspecified: Secondary | ICD-10-CM | POA: Insufficient documentation

## 2020-09-23 DIAGNOSIS — R2689 Other abnormalities of gait and mobility: Secondary | ICD-10-CM | POA: Insufficient documentation

## 2020-09-23 DIAGNOSIS — R293 Abnormal posture: Secondary | ICD-10-CM | POA: Insufficient documentation

## 2020-09-23 DIAGNOSIS — J3089 Other allergic rhinitis: Secondary | ICD-10-CM | POA: Diagnosis not present

## 2020-09-23 DIAGNOSIS — J301 Allergic rhinitis due to pollen: Secondary | ICD-10-CM | POA: Diagnosis not present

## 2020-09-23 DIAGNOSIS — M5441 Lumbago with sciatica, right side: Secondary | ICD-10-CM | POA: Diagnosis not present

## 2020-09-23 NOTE — Therapy (Signed)
[place], [place], [address] Phone: [phone]   Fax:  [phone]  Physical Therapy Treatment  Patient Details  Name: Darlene Mclaughlin MRN: [id number] Date of Birth: 1950-05-08 Referring Provider (PT): Vallarie Mare, MD   Encounter Date: 09/23/2020   PT End of Session - 09/23/20 1210     Visit Number 2    Number of Visits 16    Date for PT Re-Evaluation 11/19/20    Authorization Type [employer] MCR    PT Start Time 1210    PT Stop Time 1252    PT Time Calculation (min) 42 min    Activity Tolerance Patient tolerated treatment well    Behavior During Therapy [facility] for tasks assessed/performed             Past Medical History:  Diagnosis Date   ALLERGIC RHINITIS 05/15/2008   Anemia    ASTHMA 05/15/2008   Asthma    Blood transfusion without reported diagnosis 1991   anemia   Breast cancer ([place]) 2016   right breast   Cancer ([place])    DCIS right breast   Diabetes mellitus without complication ([place])    GERD (gastroesophageal reflux disease)    Headache(784.0) 05/15/2008   HYPERTENSION 05/15/2008   IMPAIRED GLUCOSE TOLERANCE 05/15/2008   Obesity    OSTEOARTHRITIS 05/15/2008   back    Past Surgical History:  Procedure Laterality Date   ABDOMINAL HYSTERECTOMY     ANTERIOR LAT LUMBAR FUSION Left 05/21/2020   Procedure: LUMBAR TWO-THREE, LUMBAR THREE-FOUR DIRECT LATERAL LUMBAR INTERBODY FUSION;  Surgeon: Vallarie Mare, MD;  Location: [place];  Service: Neurosurgery;  Laterality: Left;   APPLICATION OF ROBOTIC ASSISTANCE FOR SPINAL PROCEDURE N/A 05/21/2020   Procedure: APPLICATION OF ROBOTIC ASSISTANCE FOR SPINAL PROCEDURE;  Surgeon: Vallarie Mare, MD;  Location: [place];  Service: Neurosurgery;  Laterality: N/A;   BREAST LUMPECTOMY Right 12/17/2014   BREAST LUMPECTOMY WITH RADIOACTIVE SEED LOCALIZATION Right 12/17/2014   Procedure: RIGHT BREAST RADIOACTIVE SEED GUIDED PARTIAL MASTECTOMY;   Surgeon: Erroll Luna, MD;  Location: [place];  Service: General;  Laterality: Right;   CESAREAN SECTION     KNEE SURGERY     arthroscopic left   TRANSFORAMINAL LUMBAR INTERBODY FUSION W/ MIS 2 LEVEL Right 05/21/2020   Procedure: LUMBAR FOUR-FIVE, LUMBAR FIVE-SACRAL ONE MINIMALLY INVASIVE TRANSFORAMINAL LUMBAR INTERBODY FUSION WITH INSTRUMENTATION LUMBAR TWO-SACRAL ONE;  Surgeon: Vallarie Mare, MD;  Location: [place];  Service: Neurosurgery;  Laterality: Right;    There were no vitals filed for this visit.   Subjective Assessment - 09/23/20 1217     Subjective Pt reports that she has been HEP compliant.  She feels some muscular soreness from HEP.  4/10 R sided low back pain today.                [facility] PT Assessment - 09/23/20 0001       Strength   Right Ankle Dorsiflexion 2+/5            [facility] Adult PT Treatment/Exercise:  Therapeutic Exercise: - nu-step L5, 37m- heel raises - 2x10 - LAQ - 2# - 3x10 - Seated hip adduction squeeze - 5'' hold - 10x - combined with abdominal contraction - DF in sitting (PT assist for ROM, followed by resistance/eccentric) 10x   Neuromuscular re-ed: - semi tandem at counter - 4x30'' - feet together on foam - 2x45''     PT Short Term Goals -  09/18/20 1158       PT SHORT TERM GOAL #1   Title Darlene Mclaughlin will be >75% HEP compliant within 3 weeks to improve carryover between sessions and facilitate independent management of condition.    Target Date 10/09/20               PT Long Term Goals - 09/18/20 1159       PT LONG TERM GOAL #1   Title Darlene Mclaughlin will improve FOTO score from 48 (on eval) to 52 as a proxy for functional improvement    Target Date 11/19/20      PT LONG TERM GOAL #2   Title Darlene Mclaughlin will be able to return to water aerobics, not limited by pain  EVAL: not able    Target Date 11/19/20      PT LONG TERM GOAL #3   Title Darlene Mclaughlin will improve max gait  speed to .9 m/s (.1 m/s MCID) to show functional improvement in ambulation  EVAL: .625 m/s    Target Date 11/19/20      PT LONG TERM GOAL #4   Title Darlene Mclaughlin will improve 30'' STS (MCID 2) to >/= 10x to show improved LE strength and improved transfers  EVAL: 7x    Target Date 11/19/20      PT LONG TERM GOAL #5   Title Darlene Mclaughlin will be able to stand for >30'' in semi tandem stance, to show a significant improvement in balance in order to reduce fall risk  EVAL: <10'' in semi-tandem stance bil    Target Date 11/19/20                   Plan - 09/23/20 1242     Clinical Impression Statement Pt reports no increase in baseline pain following therapy  HEP was updated and reissued to patient    Overall, Darlene Mclaughlin is progressing well with therapy.  Today we concentrated on lower extremity strengthening and balance/proprioception.  Pt shows profound DF weakness which should be addressed.  Balance progressing as expected.  Pt will continue to benefit from skilled physical therapy to address remaining deficits and achieve listed goals.  Continue per POC.    Personal Factors and Comorbidities Comorbidity 2    Comorbidities See "Pertinent History" in "Subjective Assessment"  and/or precautions    Stability/Clinical Decision Making Stable/Uncomplicated    Rehab Potential Good    PT Frequency 2x / week    PT Duration 8 weeks    PT Treatment/Interventions --   ADLs/Self Care Home Management;Aquatic Therapy;Therapeutic activities;Therapeutic exercise;Neuromuscular re-education;Manual techniques;Iontophoresis '4mg'$ /ml Dexamethasone;Dry needling;Gait training   PT Next Visit Plan MMT LE, balance progression, core strength progression, LE strength progression    PT Home Exercise Plan [id number]    Consulted and Agree with Plan of Care Patient             Patient will benefit from skilled therapeutic intervention in order to improve the following deficits and  impairments:     Visit Diagnosis: Muscle weakness (generalized)  Other abnormalities of gait and mobility  Chronic right-sided low back pain, unspecified whether sciatica present     Problem List Patient Active Problem List   Diagnosis Date Noted   Lumbar radiculopathy 05/21/2020   Statin intolerance 01/16/2018   Bilateral leg edema 12/21/2017   History of colonic polyps 10/18/2017   Dyslipidemia 10/18/2017   Essential hypertension 03/26/2016   Family history of colon  cancer 03/26/2016   Osteopenia 08/28/2015   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

## 2020-09-25 ENCOUNTER — Encounter: Payer: Self-pay | Admitting: Physical Therapy

## 2020-09-25 ENCOUNTER — Ambulatory Visit: Payer: Medicare PPO | Admitting: Physical Therapy

## 2020-09-25 ENCOUNTER — Other Ambulatory Visit: Payer: Self-pay

## 2020-09-25 DIAGNOSIS — M5441 Lumbago with sciatica, right side: Secondary | ICD-10-CM | POA: Diagnosis not present

## 2020-09-25 DIAGNOSIS — M6281 Muscle weakness (generalized): Secondary | ICD-10-CM

## 2020-09-25 DIAGNOSIS — M545 Low back pain, unspecified: Secondary | ICD-10-CM | POA: Diagnosis not present

## 2020-09-25 DIAGNOSIS — G8929 Other chronic pain: Secondary | ICD-10-CM | POA: Diagnosis not present

## 2020-09-25 DIAGNOSIS — R293 Abnormal posture: Secondary | ICD-10-CM | POA: Diagnosis not present

## 2020-09-25 DIAGNOSIS — R2689 Other abnormalities of gait and mobility: Secondary | ICD-10-CM | POA: Diagnosis not present

## 2020-09-25 NOTE — Therapy (Signed)
[place], [place], [address] Phone: [phone]   Fax:  [phone]  Physical Therapy Treatment  Patient Details  Name: Darlene Mclaughlin MRN: [id number] Date of Birth: 10/10/1950 Referring Provider (PT): Vallarie Mare, MD   Encounter Date: 09/25/2020   PT End of Session - 09/25/20 1040     Visit Number 3    Number of Visits 16    Date for PT Re-Evaluation 11/19/20    Authorization Type [employer] MCR    PT Start Time [id number]    PT Stop Time 1128    PT Time Calculation (min) 43 min    Activity Tolerance Patient tolerated treatment well    Behavior During Therapy [facility] for tasks assessed/performed             Past Medical History:  Diagnosis Date   ALLERGIC RHINITIS 05/15/2008   Anemia    ASTHMA 05/15/2008   Asthma    Blood transfusion without reported diagnosis 1991   anemia   Breast cancer ([place]) 2016   right breast   Cancer ([place])    DCIS right breast   Diabetes mellitus without complication ([place])    GERD (gastroesophageal reflux disease)    Headache(784.0) 05/15/2008   HYPERTENSION 05/15/2008   IMPAIRED GLUCOSE TOLERANCE 05/15/2008   Obesity    OSTEOARTHRITIS 05/15/2008   back    Past Surgical History:  Procedure Laterality Date   ABDOMINAL HYSTERECTOMY     ANTERIOR LAT LUMBAR FUSION Left 05/21/2020   Procedure: LUMBAR TWO-THREE, LUMBAR THREE-FOUR DIRECT LATERAL LUMBAR INTERBODY FUSION;  Surgeon: Vallarie Mare, MD;  Location: [place];  Service: Neurosurgery;  Laterality: Left;   APPLICATION OF ROBOTIC ASSISTANCE FOR SPINAL PROCEDURE N/A 05/21/2020   Procedure: APPLICATION OF ROBOTIC ASSISTANCE FOR SPINAL PROCEDURE;  Surgeon: Vallarie Mare, MD;  Location: [place];  Service: Neurosurgery;  Laterality: N/A;   BREAST LUMPECTOMY Right 12/17/2014   BREAST LUMPECTOMY WITH RADIOACTIVE SEED LOCALIZATION Right 12/17/2014   Procedure: RIGHT BREAST RADIOACTIVE SEED GUIDED PARTIAL MASTECTOMY;   Surgeon: Erroll Luna, MD;  Location: [place];  Service: General;  Laterality: Right;   CESAREAN SECTION     KNEE SURGERY     arthroscopic left   TRANSFORAMINAL LUMBAR INTERBODY FUSION W/ MIS 2 LEVEL Right 05/21/2020   Procedure: LUMBAR FOUR-FIVE, LUMBAR FIVE-SACRAL ONE MINIMALLY INVASIVE TRANSFORAMINAL LUMBAR INTERBODY FUSION WITH INSTRUMENTATION LUMBAR TWO-SACRAL ONE;  Surgeon: Vallarie Mare, MD;  Location: [place];  Service: Neurosurgery;  Laterality: Right;    There were no vitals filed for this visit.   Subjective Assessment - 09/25/20 1049     Subjective Pt reports that her balance is improving.  She feels her HEP is going well. She feels she is improving.  3/10 R sided low back pain today.    Pertinent History lumbar fusion late March 2022, DM TII, hx of breast cancer             [facility] Adult PT Treatment/Exercise:   Therapeutic Exercise: - nu-step L5, 26m- Sit to stand - 3x5 - from chair + airex - heel raises - 2x10 - lateral walking - 4# ankle weights - 10 steps - 4 laps - Standing march - 4# ankle weights 2x10 ea - DF in sitting (PT assist for ROM, followed by resistance/eccentric) 10x - LAQ - 4# - 3x10   Neuromuscular re-ed: - semi tandem at counter - 4x30'' - feet together eyes closed - 3x30'' - rocker board  PF/DF w/ UE support     PT Short Term Goals - 09/18/20 1158       PT SHORT TERM GOAL #1   Title Darlene Mclaughlin will be >75% HEP compliant within 3 weeks to improve carryover between sessions and facilitate independent management of condition.    Target Date 10/09/20               PT Long Term Goals - 09/18/20 1159       PT LONG TERM GOAL #1   Title Darlene Mclaughlin will improve FOTO score from 48 (on eval) to 52 as a proxy for functional improvement    Target Date 11/19/20      PT LONG TERM GOAL #2   Title Darlene Mclaughlin will be able to return to water aerobics, not limited by pain  EVAL: not able    Target Date  11/19/20      PT LONG TERM GOAL #3   Title Darlene Mclaughlin will improve max gait speed to .9 m/s (.1 m/s MCID) to show functional improvement in ambulation  EVAL: .625 m/s    Target Date 11/19/20      PT LONG TERM GOAL #4   Title Darlene Mclaughlin will improve 30'' STS (MCID 2) to >/= 10x to show improved LE strength and improved transfers  EVAL: 7x    Target Date 11/19/20      PT LONG TERM GOAL #5   Title [name] will be able to stand for >30'' in semi tandem stance, to show a significant improvement in balance in order to reduce fall risk  EVAL: <10'' in semi-tandem stance bil    Target Date 11/19/20                   Plan - 09/25/20 1114     Clinical Impression Statement Pt reports no increase in baseline pain following therapy  HEP was updated and reissued to patient    Overall, [name] is progressing well with therapy.  Today we concentrated on lower extremity strengthening, hip strengthening, and balance/proprioception.  Pt continues to show reduced DF strength (significant) will consider e-stip next visit for tib anterior.  Pt will continue to benefit from skilled physical therapy to address remaining deficits and achieve listed goals.  Continue per POC.    Personal Factors and Comorbidities Comorbidity 2    Comorbidities See "Pertinent History" in "Subjective Assessment"  and/or precautions    Stability/Clinical Decision Making Stable/Uncomplicated    Rehab Potential Good    PT Frequency 2x / week    PT Duration 8 weeks    PT Treatment/Interventions --   ADLs/Self Care Home Management;Aquatic Therapy;Therapeutic activities;Therapeutic exercise;Neuromuscular re-education;Manual techniques;Iontophoresis '4mg'$ /ml Dexamethasone;Dry needling;Gait training   PT Next Visit Plan MMT LE, balance progression, core strength progression, LE strength progression    PT Home Exercise Plan [id number]    Consulted and Agree with Plan of Care Patient              Patient will benefit from skilled therapeutic intervention in order to improve the following deficits and impairments:     Visit Diagnosis: Muscle weakness (generalized)  Other abnormalities of gait and mobility  Chronic right-sided low back pain, unspecified whether sciatica present  Acute bilateral low back pain with right-sided sciatica     Problem List Patient Active Problem List   Diagnosis Date Noted   Lumbar radiculopathy 05/21/2020   Statin intolerance 01/16/2018  Bilateral leg edema 12/21/2017   History of colonic polyps 10/18/2017   Dyslipidemia 10/18/2017   Essential hypertension 03/26/2016   Family history of colon cancer 03/26/2016   Osteopenia 08/28/2015   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

## 2020-09-29 ENCOUNTER — Ambulatory Visit: Payer: Medicare PPO

## 2020-09-29 ENCOUNTER — Other Ambulatory Visit: Payer: Self-pay

## 2020-09-29 DIAGNOSIS — M5441 Lumbago with sciatica, right side: Secondary | ICD-10-CM | POA: Diagnosis not present

## 2020-09-29 DIAGNOSIS — R293 Abnormal posture: Secondary | ICD-10-CM | POA: Diagnosis not present

## 2020-09-29 DIAGNOSIS — M6281 Muscle weakness (generalized): Secondary | ICD-10-CM

## 2020-09-29 DIAGNOSIS — M545 Low back pain, unspecified: Secondary | ICD-10-CM

## 2020-09-29 DIAGNOSIS — R2689 Other abnormalities of gait and mobility: Secondary | ICD-10-CM | POA: Diagnosis not present

## 2020-09-29 DIAGNOSIS — G8929 Other chronic pain: Secondary | ICD-10-CM | POA: Diagnosis not present

## 2020-09-29 NOTE — Therapy (Signed)
[place], [place], [address] Phone: [phone]   Fax:  [phone]  Physical Therapy Treatment  Patient Details  Name: FOUA MANTZ MRN: [id number] Date of Birth: Mar 05, 1950 Referring Provider (PT): Vallarie Mare, MD   Encounter Date: 09/29/2020   PT End of Session - 09/29/20 1126     Visit Number 4    Number of Visits 16    Date for PT Re-Evaluation 11/19/20    Authorization Type [employer] MCR    PT Start Time 1130    PT Stop Time 1211    PT Time Calculation (min) 41 min    Activity Tolerance Patient tolerated treatment well    Behavior During Therapy [facility] for tasks assessed/performed             Past Medical History:  Diagnosis Date   ALLERGIC RHINITIS 05/15/2008   Anemia    ASTHMA 05/15/2008   Asthma    Blood transfusion without reported diagnosis 1991   anemia   Breast cancer ([place]) 2016   right breast   Cancer ([place])    DCIS right breast   Diabetes mellitus without complication ([place])    GERD (gastroesophageal reflux disease)    Headache(784.0) 05/15/2008   HYPERTENSION 05/15/2008   IMPAIRED GLUCOSE TOLERANCE 05/15/2008   Obesity    OSTEOARTHRITIS 05/15/2008   back    Past Surgical History:  Procedure Laterality Date   ABDOMINAL HYSTERECTOMY     ANTERIOR LAT LUMBAR FUSION Left 05/21/2020   Procedure: LUMBAR TWO-THREE, LUMBAR THREE-FOUR DIRECT LATERAL LUMBAR INTERBODY FUSION;  Surgeon: Vallarie Mare, MD;  Location: [place];  Service: Neurosurgery;  Laterality: Left;   APPLICATION OF ROBOTIC ASSISTANCE FOR SPINAL PROCEDURE N/A 05/21/2020   Procedure: APPLICATION OF ROBOTIC ASSISTANCE FOR SPINAL PROCEDURE;  Surgeon: Vallarie Mare, MD;  Location: [place];  Service: Neurosurgery;  Laterality: N/A;   BREAST LUMPECTOMY Right 12/17/2014   BREAST LUMPECTOMY WITH RADIOACTIVE SEED LOCALIZATION Right 12/17/2014   Procedure: RIGHT BREAST RADIOACTIVE SEED GUIDED PARTIAL MASTECTOMY;   Surgeon: Erroll Luna, MD;  Location: [place];  Service: General;  Laterality: Right;   CESAREAN SECTION     KNEE SURGERY     arthroscopic left   TRANSFORAMINAL LUMBAR INTERBODY FUSION W/ MIS 2 LEVEL Right 05/21/2020   Procedure: LUMBAR FOUR-FIVE, LUMBAR FIVE-SACRAL ONE MINIMALLY INVASIVE TRANSFORAMINAL LUMBAR INTERBODY FUSION WITH INSTRUMENTATION LUMBAR TWO-SACRAL ONE;  Surgeon: Vallarie Mare, MD;  Location: [place];  Service: Neurosurgery;  Laterality: Right;    There were no vitals filed for this visit.   Subjective Assessment - 09/29/20 1127     Subjective Pt presents to PT with reports of increased LBP after last session and continued pain today. She has continued to be compliant with HEP with no adverse effect. Pt is ready to begin PT treatment at this time.    Currently in Pain? Yes    Pain Score 5     Pain Location Back    Pain Orientation Lower               [facility] Adult PT Treatment/Exercise:   Therapeutic Exercise: Nu-step L5, 6mwhile taking subjective Sit to stand - 2x10- from mat+ airex heel raises - 2x10 lateral walking - red tband - 2 laps Standing march - 4# ankle weights 2x10 ea (not today) DF in sitting (PT assist for ROM, followed by resistance/eccentric) 10x (not today) LAQ - 4# - 4x10   Neuromuscular re-ed: semi  tandem at counter - x30'' Tandem stance in // 2x30" ea FT on foam EO 2x30" FT on foam EC 2x30"  rocker board PF/DF w/ UE support      PT Short Term Goals - 09/18/20 1158       PT SHORT TERM GOAL #1   Title [name] will be >75% HEP compliant within 3 weeks to improve carryover between sessions and facilitate independent management of condition.    Target Date 10/09/20               PT Long Term Goals - 09/18/20 1159       PT LONG TERM GOAL #1   Title [name] will improve FOTO score from 48 (on eval) to 52 as a proxy for functional improvement    Target Date 11/19/20      PT LONG TERM  GOAL #2   Title LASHEL JENTZEN will be able to return to water aerobics, not limited by pain  EVAL: not able    Target Date 11/19/20      PT LONG TERM GOAL #3   Title [name] will improve max gait speed to .9 m/s (.1 m/s MCID) to show functional improvement in ambulation  EVAL: .625 m/s    Target Date 11/19/20      PT LONG TERM GOAL #4   Title [name] will improve 30'' STS (MCID 2) to >/= 10x to show improved LE strength and improved transfers  EVAL: 7x    Target Date 11/19/20      PT LONG TERM GOAL #5   Title MARCHELL MUNROE will be able to stand for >30'' in semi tandem stance, to show a significant improvement in balance in order to reduce fall risk  EVAL: <10'' in semi-tandem stance bil    Target Date 11/19/20                   Plan - 09/29/20 1149     Clinical Impression Statement Pt was able to once again complete prescribed exercises with no adverse effect. She continues to show R ankle instability and will frequently lose her balance to R direction when challenged. Pt is progressing well with therapy thus far and continues to benefit from skilled PT.    PT Treatment/Interventions ADLs/Self Care Home Management;Aquatic Therapy;Therapeutic exercise;Therapeutic activities;Neuromuscular re-education;Manual techniques;Iontophoresis '4mg'$ /ml Dexamethasone;Dry needling;Gait training    PT Next Visit Plan MMT LE, balance progression, core strength progression, LE strength progression    PT Home Exercise Plan [facility]             Patient will benefit from skilled therapeutic intervention in order to improve the following deficits and impairments:     Visit Diagnosis: Muscle weakness (generalized)  Other abnormalities of gait and mobility  Chronic right-sided low back pain, unspecified whether sciatica present     Problem List Patient Active Problem List   Diagnosis Date Noted   Lumbar radiculopathy 05/21/2020   Statin intolerance  01/16/2018   Bilateral leg edema 12/21/2017   History of colonic polyps 10/18/2017   Dyslipidemia 10/18/2017   Essential hypertension 03/26/2016   Family history of colon cancer 03/26/2016   Osteopenia 08/28/2015   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 [address] [place], [place], [address] Phone: [phone]   Fax:  [phone]  Name: KEYAIRRA HIRANI MRN: [id number] Date of Birth: July 28, 1950

## 2020-10-01 ENCOUNTER — Encounter: Payer: Self-pay | Admitting: Physical Therapy

## 2020-10-01 ENCOUNTER — Other Ambulatory Visit: Payer: Self-pay

## 2020-10-01 ENCOUNTER — Ambulatory Visit: Payer: Medicare PPO | Admitting: Physical Therapy

## 2020-10-01 DIAGNOSIS — M6281 Muscle weakness (generalized): Secondary | ICD-10-CM

## 2020-10-01 DIAGNOSIS — G8929 Other chronic pain: Secondary | ICD-10-CM | POA: Diagnosis not present

## 2020-10-01 DIAGNOSIS — R293 Abnormal posture: Secondary | ICD-10-CM

## 2020-10-01 DIAGNOSIS — M5441 Lumbago with sciatica, right side: Secondary | ICD-10-CM | POA: Diagnosis not present

## 2020-10-01 DIAGNOSIS — R2689 Other abnormalities of gait and mobility: Secondary | ICD-10-CM | POA: Diagnosis not present

## 2020-10-01 DIAGNOSIS — M545 Low back pain, unspecified: Secondary | ICD-10-CM | POA: Diagnosis not present

## 2020-10-01 NOTE — Therapy (Signed)
[place], [place], [address] Phone: [phone]   Fax:  [phone]  Physical Therapy Treatment  Patient Details  Name: Darlene Mclaughlin MRN: [id number] Date of Birth: 30-Oct-1950 Referring Provider (PT): Vallarie Mare, MD   Encounter Date: 10/01/2020   PT End of Session - 10/01/20 1133     Visit Number 5    Number of Visits 16    Date for PT Re-Evaluation 11/19/20    Authorization Type [employer] MCR    PT Start Time 1130    PT Stop Time [id number]    PT Time Calculation (min) 45 min    Activity Tolerance Patient tolerated treatment well    Behavior During Therapy [facility] for tasks assessed/performed             Past Medical History:  Diagnosis Date   ALLERGIC RHINITIS 05/15/2008   Anemia    ASTHMA 05/15/2008   Asthma    Blood transfusion without reported diagnosis 1991   anemia   Breast cancer ([place]) 2016   right breast   Cancer ([place])    DCIS right breast   Diabetes mellitus without complication ([place])    GERD (gastroesophageal reflux disease)    Headache(784.0) 05/15/2008   HYPERTENSION 05/15/2008   IMPAIRED GLUCOSE TOLERANCE 05/15/2008   Obesity    OSTEOARTHRITIS 05/15/2008   back    Past Surgical History:  Procedure Laterality Date   ABDOMINAL HYSTERECTOMY     ANTERIOR LAT LUMBAR FUSION Left 05/21/2020   Procedure: LUMBAR TWO-THREE, LUMBAR THREE-FOUR DIRECT LATERAL LUMBAR INTERBODY FUSION;  Surgeon: Vallarie Mare, MD;  Location: [place];  Service: Neurosurgery;  Laterality: Left;   APPLICATION OF ROBOTIC ASSISTANCE FOR SPINAL PROCEDURE N/A 05/21/2020   Procedure: APPLICATION OF ROBOTIC ASSISTANCE FOR SPINAL PROCEDURE;  Surgeon: Vallarie Mare, MD;  Location: [place];  Service: Neurosurgery;  Laterality: N/A;   BREAST LUMPECTOMY Right 12/17/2014   BREAST LUMPECTOMY WITH RADIOACTIVE SEED LOCALIZATION Right 12/17/2014   Procedure: RIGHT BREAST RADIOACTIVE SEED GUIDED PARTIAL MASTECTOMY;   Surgeon: Erroll Luna, MD;  Location: [place];  Service: General;  Laterality: Right;   CESAREAN SECTION     KNEE SURGERY     arthroscopic left   TRANSFORAMINAL LUMBAR INTERBODY FUSION W/ MIS 2 LEVEL Right 05/21/2020   Procedure: LUMBAR FOUR-FIVE, LUMBAR FIVE-SACRAL ONE MINIMALLY INVASIVE TRANSFORAMINAL LUMBAR INTERBODY FUSION WITH INSTRUMENTATION LUMBAR TWO-SACRAL ONE;  Surgeon: Vallarie Mare, MD;  Location: [place];  Service: Neurosurgery;  Laterality: Right;    There were no vitals filed for this visit.   Subjective Assessment - 10/01/20 1140     Subjective Pt reports that her pain was somewhat higher after therapy on Monday.  Her pain has diminished today.  She feels more confident in walking now.  Her pain is a 5/10 R sided LBP today.    Pertinent History lumbar fusion late March 2022, DM TII, hx of breast cancer             [facility] Adult PT Treatment/Exercise:   Therapeutic Exercise: Nu-step L5, 62mwhile taking subjective Sit to stand -  from mat + airex min UE support - 2x10 heel raises - 2x10 on foam Hip abd in // - red tband - 3x10 ea Standing march - on foam - 3x10  LAQ - 10# - R 3x10   Neuromuscular re-ed:  Tandem stance in // 2x30" ea FT on foam EC 4x30" rocker board PF/DF w/ UE support (not  today)    PT Short Term Goals - 09/18/20 1158       PT SHORT TERM GOAL #1   Title Darlene Mclaughlin will be >75% HEP compliant within 3 weeks to improve carryover between sessions and facilitate independent management of condition.    Target Date 10/09/20               PT Long Term Goals - 09/18/20 1159       PT LONG TERM GOAL #1   Title Darlene Mclaughlin will improve FOTO score from 48 (on eval) to 52 as a proxy for functional improvement    Target Date 11/19/20      PT LONG TERM GOAL #2   Title Darlene Mclaughlin will be able to return to water aerobics, not limited by pain  EVAL: not able    Target Date 11/19/20      PT LONG TERM GOAL  #3   Title Darlene Mclaughlin will improve max gait speed to .9 m/s (.1 m/s MCID) to show functional improvement in ambulation  EVAL: .625 m/s    Target Date 11/19/20      PT LONG TERM GOAL #4   Title Darlene Mclaughlin will improve 30'' STS (MCID 2) to >/= 10x to show improved LE strength and improved transfers  EVAL: 7x    Target Date 11/19/20      PT LONG TERM GOAL #5   Title Darlene Mclaughlin will be able to stand for >30'' in semi tandem stance, to show a significant improvement in balance in order to reduce fall risk  EVAL: <10'' in semi-tandem stance bil    Target Date 11/19/20                   Plan - 10/01/20 1312     Clinical Impression Statement Pt reports no increase in baseline pain following therapy  HEP was reviewed, but left unchanged    Overall, Darlene Mclaughlin is progressing well with therapy.  Today we concentrated on hip strengthening and balance/proprioception.  Pt shows stronger contraction of tib anterior today, but still is not able to DF more than 50% of full ROM.  She is consistently clearing toe during swing.  Will consider estim next session.  Pt will continue to benefit from skilled physical therapy to address remaining deficits and achieve listed goals.  Continue per POC.    Comorbidities See "Pertinent History" in "Subjective Assessment"  and/or precautions    PT Treatment/Interventions ADLs/Self Care Home Management;Aquatic Therapy;Therapeutic exercise;Therapeutic activities;Neuromuscular re-education;Manual techniques;Iontophoresis '4mg'$ /ml Dexamethasone;Dry needling;Gait training    PT Next Visit Plan MMT LE, balance progression, core strength progression, LE strength progression    PT Home Exercise Plan [facility]             Patient will benefit from skilled therapeutic intervention in order to improve the following deficits and impairments:  Decreased endurance, Abnormal gait, Decreased activity tolerance, Decreased strength, Pain, Decreased  balance  Visit Diagnosis: Muscle weakness (generalized)  Other abnormalities of gait and mobility  Chronic right-sided low back pain, unspecified whether sciatica present  Acute bilateral low back pain with right-sided sciatica  Abnormal posture     Problem List Patient Active Problem List   Diagnosis Date Noted   Lumbar radiculopathy 05/21/2020   Statin intolerance 01/16/2018   Bilateral leg edema 12/21/2017   History of colonic polyps 10/18/2017   Dyslipidemia 10/18/2017   Essential hypertension 03/26/2016   Family history of colon  cancer 03/26/2016   Osteopenia 08/28/2015   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 [place], [place], [address] Phone: [phone]   Fax:  [phone]  Name: Darlene Mclaughlin MRN: [id number] Date of Birth: 16-Dec-1950

## 2020-10-03 DIAGNOSIS — J301 Allergic rhinitis due to pollen: Secondary | ICD-10-CM | POA: Diagnosis not present

## 2020-10-03 DIAGNOSIS — J3089 Other allergic rhinitis: Secondary | ICD-10-CM | POA: Diagnosis not present

## 2020-10-07 ENCOUNTER — Ambulatory Visit: Payer: Medicare PPO | Admitting: Physical Therapy

## 2020-10-07 ENCOUNTER — Other Ambulatory Visit: Payer: Self-pay

## 2020-10-07 ENCOUNTER — Encounter: Payer: Self-pay | Admitting: Physical Therapy

## 2020-10-07 DIAGNOSIS — M5441 Lumbago with sciatica, right side: Secondary | ICD-10-CM | POA: Diagnosis not present

## 2020-10-07 DIAGNOSIS — G8929 Other chronic pain: Secondary | ICD-10-CM

## 2020-10-07 DIAGNOSIS — R293 Abnormal posture: Secondary | ICD-10-CM | POA: Diagnosis not present

## 2020-10-07 DIAGNOSIS — M6281 Muscle weakness (generalized): Secondary | ICD-10-CM | POA: Diagnosis not present

## 2020-10-07 DIAGNOSIS — M545 Low back pain, unspecified: Secondary | ICD-10-CM | POA: Diagnosis not present

## 2020-10-07 DIAGNOSIS — R2689 Other abnormalities of gait and mobility: Secondary | ICD-10-CM

## 2020-10-07 NOTE — Therapy (Signed)
[place], [place], [address] Phone: [phone]   Fax:  [phone]  Physical Therapy Treatment  Patient Details  Name: Darlene Mclaughlin MRN: [id number] Date of Birth: 1950-11-24 Referring Provider (PT): Vallarie Mare, MD   Encounter Date: 10/07/2020   PT End of Session - 10/07/20 1215     Visit Number 6    Number of Visits 16    Date for PT Re-Evaluation 11/19/20    Authorization Type [employer] MCR - foto    PT Start Time [id number]    PT Stop Time [id number]    PT Time Calculation (min) 44 min    Activity Tolerance Patient tolerated treatment well    Behavior During Therapy [facility] for tasks assessed/performed             Past Medical History:  Diagnosis Date   ALLERGIC RHINITIS 05/15/2008   Anemia    ASTHMA 05/15/2008   Asthma    Blood transfusion without reported diagnosis 1991   anemia   Breast cancer ([place]) 2016   right breast   Cancer ([place])    DCIS right breast   Diabetes mellitus without complication ([place])    GERD (gastroesophageal reflux disease)    Headache(784.0) 05/15/2008   HYPERTENSION 05/15/2008   IMPAIRED GLUCOSE TOLERANCE 05/15/2008   Obesity    OSTEOARTHRITIS 05/15/2008   back    Past Surgical History:  Procedure Laterality Date   ABDOMINAL HYSTERECTOMY     ANTERIOR LAT LUMBAR FUSION Left 05/21/2020   Procedure: LUMBAR TWO-THREE, LUMBAR THREE-FOUR DIRECT LATERAL LUMBAR INTERBODY FUSION;  Surgeon: Vallarie Mare, MD;  Location: [place];  Service: Neurosurgery;  Laterality: Left;   APPLICATION OF ROBOTIC ASSISTANCE FOR SPINAL PROCEDURE N/A 05/21/2020   Procedure: APPLICATION OF ROBOTIC ASSISTANCE FOR SPINAL PROCEDURE;  Surgeon: Vallarie Mare, MD;  Location: [place];  Service: Neurosurgery;  Laterality: N/A;   BREAST LUMPECTOMY Right 12/17/2014   BREAST LUMPECTOMY WITH RADIOACTIVE SEED LOCALIZATION Right 12/17/2014   Procedure: RIGHT BREAST RADIOACTIVE SEED GUIDED PARTIAL MASTECTOMY;   Surgeon: Erroll Luna, MD;  Location: [place];  Service: General;  Laterality: Right;   CESAREAN SECTION     KNEE SURGERY     arthroscopic left   TRANSFORAMINAL LUMBAR INTERBODY FUSION W/ MIS 2 LEVEL Right 05/21/2020   Procedure: LUMBAR FOUR-FIVE, LUMBAR FIVE-SACRAL ONE MINIMALLY INVASIVE TRANSFORAMINAL LUMBAR INTERBODY FUSION WITH INSTRUMENTATION LUMBAR TWO-SACRAL ONE;  Surgeon: Vallarie Mare, MD;  Location: [place];  Service: Neurosurgery;  Laterality: Right;    There were no vitals filed for this visit.   Subjective Assessment - 10/07/20 1221     Subjective Pt reports that she was fatigued after therapy on Thursday.  She felt this may have increased her pain somewhat over the weekend.  Her pain is 6/10 R hip and LBP today.  Aggs: activity Eases: rest    Pertinent History lumbar fusion late March 2022, DM TII, hx of breast cancer             [facility] Adult PT Treatment/Exercise:   Therapeutic Exercise: Nu-step L5, 29mwhile taking subjective Abdominal contraction + hip adduction in supine with ball - 5'' hold 10x Abdominal contraction + alternating clamshell with GTB - 10x Alternating supine march - GTB - 10x Bridge - 3x - partial ROM Bridge progression (next session)  The following therex was not completed today:  Sit to stand -  from mat + airex min UE support - 2x10 heel  raises - 2x10 on foam Hip abd in // - red tband - 3x10 ea Standing march - on foam - 3x10  LAQ - 10# - R 3x10  Therapeutic activity:  Administering and reviewing FOTO score   Neuromuscular re-ed:   1/2 tandem EC FT on foam EC 4x30" rocker board PF/DF w/ UE support    PT Short Term Goals - 10/07/20 1254       PT SHORT TERM GOAL #1   Title [name] will be >75% HEP compliant within 3 weeks to improve carryover between sessions and facilitate independent management of condition.    Status Achieved    Target Date 10/09/20               PT Long Term Goals -  09/18/20 1159       PT LONG TERM GOAL #1   Title [name] will improve FOTO score from 48 (on eval) to 52 as a proxy for functional improvement    Target Date 11/19/20      PT LONG TERM GOAL #2   Title [name] will be able to return to water aerobics, not limited by pain  EVAL: not able    Target Date 11/19/20      PT LONG TERM GOAL #3   Title [name] will improve max gait speed to .9 m/s (.1 m/s MCID) to show functional improvement in ambulation  EVAL: .625 m/s    Target Date 11/19/20      PT LONG TERM GOAL #4   Title [name] will improve 30'' STS (MCID 2) to >/= 10x to show improved LE strength and improved transfers  EVAL: 7x    Target Date 11/19/20      PT LONG TERM GOAL #5   Title [name] will be able to stand for >30'' in semi tandem stance, to show a significant improvement in balance in order to reduce fall risk  EVAL: <10'' in semi-tandem stance bil    Target Date 11/19/20                   Plan - 10/07/20 1255     Clinical Impression Statement Pt reports no increase in baseline pain following therapy  HEP was reviewed, but left unchanged    Overall, [name] is progressing well with therapy.  Today we concentrated on core strengthening, lower extremity strengthening, and balance/proprioception.  Pt FOTO score decreased 8 pts to 40 pts, likely d/t increase in muscle soreness from exercise.  Pt feels she is functionally improving.  It is clear her balance and strength in clinic is improving.  Will likely move to more mat exercises for strengthening and core activation for next several sessions  Pt will continue to benefit from skilled physical therapy to address remaining deficits and achieve listed goals.  Continue per POC.    Comorbidities See "Pertinent History" in "Subjective Assessment"  and/or precautions    PT Treatment/Interventions ADLs/Self Care Home Management;Aquatic Therapy;Therapeutic  exercise;Therapeutic activities;Neuromuscular re-education;Manual techniques;Iontophoresis '4mg'$ /ml Dexamethasone;Dry needling;Gait training    PT Next Visit Plan MMT LE, balance progression, core strength progression, LE strength progression    PT Home Exercise Plan [facility]             Patient will benefit from skilled therapeutic intervention in order to improve the following deficits and impairments:  Decreased endurance, Abnormal gait, Decreased activity tolerance, Decreased strength, Pain, Decreased balance  Visit Diagnosis: Muscle weakness (  generalized)  Other abnormalities of gait and mobility  Chronic right-sided low back pain, unspecified whether sciatica present  Acute bilateral low back pain with right-sided sciatica     Problem List Patient Active Problem List   Diagnosis Date Noted   Lumbar radiculopathy 05/21/2020   Statin intolerance 01/16/2018   Bilateral leg edema 12/21/2017   History of colonic polyps 10/18/2017   Dyslipidemia 10/18/2017   Essential hypertension 03/26/2016   Family history of colon cancer 03/26/2016   Osteopenia 08/28/2015   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 [place], [place], [address] Phone: [phone]   Fax:  [phone]  Name: DAVINNA DEGREGORY MRN: [id number] Date of Birth: December 23, 1950

## 2020-10-09 ENCOUNTER — Ambulatory Visit: Payer: Medicare PPO | Admitting: Physical Therapy

## 2020-10-09 ENCOUNTER — Encounter: Payer: Self-pay | Admitting: Physical Therapy

## 2020-10-09 ENCOUNTER — Other Ambulatory Visit: Payer: Self-pay

## 2020-10-09 DIAGNOSIS — M545 Low back pain, unspecified: Secondary | ICD-10-CM

## 2020-10-09 DIAGNOSIS — R293 Abnormal posture: Secondary | ICD-10-CM | POA: Diagnosis not present

## 2020-10-09 DIAGNOSIS — M6281 Muscle weakness (generalized): Secondary | ICD-10-CM

## 2020-10-09 DIAGNOSIS — J3089 Other allergic rhinitis: Secondary | ICD-10-CM | POA: Diagnosis not present

## 2020-10-09 DIAGNOSIS — J301 Allergic rhinitis due to pollen: Secondary | ICD-10-CM | POA: Diagnosis not present

## 2020-10-09 DIAGNOSIS — R2689 Other abnormalities of gait and mobility: Secondary | ICD-10-CM | POA: Diagnosis not present

## 2020-10-09 DIAGNOSIS — G8929 Other chronic pain: Secondary | ICD-10-CM | POA: Diagnosis not present

## 2020-10-09 DIAGNOSIS — M5441 Lumbago with sciatica, right side: Secondary | ICD-10-CM | POA: Diagnosis not present

## 2020-10-09 NOTE — Therapy (Signed)
[place], [place], [address] Phone: [phone]   Fax:  [phone]  Physical Therapy Treatment  Patient Details  Name: Darlene Mclaughlin MRN: [id number] Date of Birth: 21-Jun-1950 Referring Provider (PT): Vallarie Mare, MD   Encounter Date: 10/09/2020   PT End of Session - 10/09/20 1126     Visit Number 7    Number of Visits 16    Date for PT Re-Evaluation 11/19/20    Authorization Type [employer] MCR - foto    PT Start Time [id number]    PT Stop Time 1210    PT Time Calculation (min) 44 min    Activity Tolerance Patient tolerated treatment well    Behavior During Therapy [facility] for tasks assessed/performed             Past Medical History:  Diagnosis Date   ALLERGIC RHINITIS 05/15/2008   Anemia    ASTHMA 05/15/2008   Asthma    Blood transfusion without reported diagnosis 1991   anemia   Breast cancer ([place]) 2016   right breast   Cancer ([place])    DCIS right breast   Diabetes mellitus without complication ([place])    GERD (gastroesophageal reflux disease)    Headache(784.0) 05/15/2008   HYPERTENSION 05/15/2008   IMPAIRED GLUCOSE TOLERANCE 05/15/2008   Obesity    OSTEOARTHRITIS 05/15/2008   back    Past Surgical History:  Procedure Laterality Date   ABDOMINAL HYSTERECTOMY     ANTERIOR LAT LUMBAR FUSION Left 05/21/2020   Procedure: LUMBAR TWO-THREE, LUMBAR THREE-FOUR DIRECT LATERAL LUMBAR INTERBODY FUSION;  Surgeon: Vallarie Mare, MD;  Location: [place];  Service: Neurosurgery;  Laterality: Left;   APPLICATION OF ROBOTIC ASSISTANCE FOR SPINAL PROCEDURE N/A 05/21/2020   Procedure: APPLICATION OF ROBOTIC ASSISTANCE FOR SPINAL PROCEDURE;  Surgeon: Vallarie Mare, MD;  Location: [place];  Service: Neurosurgery;  Laterality: N/A;   BREAST LUMPECTOMY Right 12/17/2014   BREAST LUMPECTOMY WITH RADIOACTIVE SEED LOCALIZATION Right 12/17/2014   Procedure: RIGHT BREAST RADIOACTIVE SEED GUIDED PARTIAL MASTECTOMY;   Surgeon: Erroll Luna, MD;  Location: [place];  Service: General;  Laterality: Right;   CESAREAN SECTION     KNEE SURGERY     arthroscopic left   TRANSFORAMINAL LUMBAR INTERBODY FUSION W/ MIS 2 LEVEL Right 05/21/2020   Procedure: LUMBAR FOUR-FIVE, LUMBAR FIVE-SACRAL ONE MINIMALLY INVASIVE TRANSFORAMINAL LUMBAR INTERBODY FUSION WITH INSTRUMENTATION LUMBAR TWO-SACRAL ONE;  Surgeon: Vallarie Mare, MD;  Location: [place];  Service: Neurosurgery;  Laterality: Right;    There were no vitals filed for this visit.   Subjective Assessment - 10/09/20 1132     Subjective Pt reports that she feels more weak today, but she is not sure why.  She is having some R hip and lBP today which may be d/t weather changes. Aggs: activity Eases: rest    Pertinent History lumbar fusion late March 2022, DM TII, hx of breast cancer            [facility] Adult PT Treatment/Exercise:   Therapeutic Exercise: Nu-step L6, 68mwhile taking subjective Step up 2x10 6'' box with contralateral UE support 5 hurdle laps in // - step to - alternating lead DF in sitting with russian stim to tib anterior,  10/10 cycle up to 70 ma, 10 min    The following therex was not completed today:   Sit to stand -  from mat + airex min UE support - 2x10 heel raises - 2x10  on foam Hip abd in // - red tband - 3x10 ea Standing march - on foam - 3x10  LAQ - 10# - R 3x10 Abdominal contraction + hip adduction in supine with ball - 5'' hold 10x Abdominal contraction + alternating clamshell with GTB - 10x Alternating supine march - GTB - 10x Bridge - 3x - partial ROM Bridge progression (next session) Step up R only  Neuromuscular re-ed:   3/4 tandem EC - CGA rocker board PF/DF w/ UE support   Therapeutic activity:   NOT TODAY      PT Short Term Goals - 10/07/20 1254       PT SHORT TERM GOAL #1   Title Darlene Mclaughlin will be >75% HEP compliant within 3 weeks to improve carryover between sessions and  facilitate independent management of condition.    Status Achieved    Target Date 10/09/20               PT Long Term Goals - 09/18/20 1159       PT LONG TERM GOAL #1   Title Darlene Mclaughlin will improve FOTO score from 48 (on eval) to 52 as a proxy for functional improvement    Target Date 11/19/20      PT LONG TERM GOAL #2   Title Darlene Mclaughlin will be able to return to water aerobics, not limited by pain  EVAL: not able    Target Date 11/19/20      PT LONG TERM GOAL #3   Title Darlene Mclaughlin will improve max gait speed to .9 m/s (.1 m/s MCID) to show functional improvement in ambulation  EVAL: .625 m/s    Target Date 11/19/20      PT LONG TERM GOAL #4   Title Darlene Mclaughlin will improve 30'' STS (MCID 2) to >/= 10x to show improved LE strength and improved transfers  EVAL: 7x    Target Date 11/19/20      PT LONG TERM GOAL #5   Title Darlene Mclaughlin will be able to stand for >30'' in semi tandem stance, to show a significant improvement in balance in order to reduce fall risk  EVAL: <10'' in semi-tandem stance bil    Target Date 11/19/20                   Plan - 10/09/20 1210     Clinical Impression Statement Pt reports no increase in baseline pain following therapy  HEP was not updated   Overall, Darlene Mclaughlin is progressing well with therapy.  Today we concentrated on normalizing gait and balance/proprioception.  Pt responds well to russian stim and now has a 2+/5 MMT for DF.  Pt will continue to benefit from skilled physical therapy to address remaining deficits and achieve listed goals.  Continue per POC.    Comorbidities See "Pertinent History" in "Subjective Assessment"  and/or precautions    PT Treatment/Interventions ADLs/Self Care Home Management;Aquatic Therapy;Therapeutic exercise;Therapeutic activities;Neuromuscular re-education;Manual techniques;Iontophoresis '4mg'$ /ml Dexamethasone;Dry needling;Gait training    PT Next Visit Plan  MMT LE, balance progression, core strength progression, LE strength progression    PT Home Exercise Plan [facility]             Patient will benefit from skilled therapeutic intervention in order to improve the following deficits and impairments:  Decreased endurance, Abnormal gait, Decreased activity tolerance, Decreased strength, Pain, Decreased balance  Visit Diagnosis: Muscle weakness (generalized)  Other abnormalities of gait and mobility  Chronic right-sided low back pain, unspecified whether sciatica present  Acute bilateral low back pain with right-sided sciatica     Problem List Patient Active Problem List   Diagnosis Date Noted   Lumbar radiculopathy 05/21/2020   Statin intolerance 01/16/2018   Bilateral leg edema 12/21/2017   History of colonic polyps 10/18/2017   Dyslipidemia 10/18/2017   Essential hypertension 03/26/2016   Family history of colon cancer 03/26/2016   Osteopenia 08/28/2015   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

## 2020-10-14 ENCOUNTER — Ambulatory Visit: Payer: Medicare PPO

## 2020-10-14 ENCOUNTER — Other Ambulatory Visit: Payer: Self-pay

## 2020-10-14 DIAGNOSIS — G8929 Other chronic pain: Secondary | ICD-10-CM | POA: Diagnosis not present

## 2020-10-14 DIAGNOSIS — M6281 Muscle weakness (generalized): Secondary | ICD-10-CM | POA: Diagnosis not present

## 2020-10-14 DIAGNOSIS — R2689 Other abnormalities of gait and mobility: Secondary | ICD-10-CM

## 2020-10-14 DIAGNOSIS — J301 Allergic rhinitis due to pollen: Secondary | ICD-10-CM | POA: Diagnosis not present

## 2020-10-14 DIAGNOSIS — M5441 Lumbago with sciatica, right side: Secondary | ICD-10-CM | POA: Diagnosis not present

## 2020-10-14 DIAGNOSIS — J3089 Other allergic rhinitis: Secondary | ICD-10-CM | POA: Diagnosis not present

## 2020-10-14 DIAGNOSIS — M545 Low back pain, unspecified: Secondary | ICD-10-CM | POA: Diagnosis not present

## 2020-10-14 DIAGNOSIS — R293 Abnormal posture: Secondary | ICD-10-CM | POA: Diagnosis not present

## 2020-10-14 NOTE — Therapy (Signed)
[place], [place], [address] Phone: [phone]   Fax:  [phone]  Physical Therapy Treatment  Patient Details  Name: Darlene Mclaughlin MRN: [id number] Date of Birth: Oct 29, 1950 Referring Provider (PT): Vallarie Mare, MD   Encounter Date: 10/14/2020   PT End of Session - 10/14/20 0954     Visit Number 8    Number of Visits 16    Date for PT Re-Evaluation 11/19/20    Authorization Type [employer] MCR - foto    PT Start Time 1000    PT Stop Time [id number]    PT Time Calculation (min) 41 min    Activity Tolerance Patient tolerated treatment well    Behavior During Therapy [facility] for tasks assessed/performed             Past Medical History:  Diagnosis Date   ALLERGIC RHINITIS 05/15/2008   Anemia    ASTHMA 05/15/2008   Asthma    Blood transfusion without reported diagnosis 1991   anemia   Breast cancer ([place]) 2016   right breast   Cancer ([place])    DCIS right breast   Diabetes mellitus without complication ([place])    GERD (gastroesophageal reflux disease)    Headache(784.0) 05/15/2008   HYPERTENSION 05/15/2008   IMPAIRED GLUCOSE TOLERANCE 05/15/2008   Obesity    OSTEOARTHRITIS 05/15/2008   back    Past Surgical History:  Procedure Laterality Date   ABDOMINAL HYSTERECTOMY     ANTERIOR LAT LUMBAR FUSION Left 05/21/2020   Procedure: LUMBAR TWO-THREE, LUMBAR THREE-FOUR DIRECT LATERAL LUMBAR INTERBODY FUSION;  Surgeon: Vallarie Mare, MD;  Location: [place];  Service: Neurosurgery;  Laterality: Left;   APPLICATION OF ROBOTIC ASSISTANCE FOR SPINAL PROCEDURE N/A 05/21/2020   Procedure: APPLICATION OF ROBOTIC ASSISTANCE FOR SPINAL PROCEDURE;  Surgeon: Vallarie Mare, MD;  Location: [place];  Service: Neurosurgery;  Laterality: N/A;   BREAST LUMPECTOMY Right 12/17/2014   BREAST LUMPECTOMY WITH RADIOACTIVE SEED LOCALIZATION Right 12/17/2014   Procedure: RIGHT BREAST RADIOACTIVE SEED GUIDED PARTIAL MASTECTOMY;   Surgeon: Erroll Luna, MD;  Location: [place];  Service: General;  Laterality: Right;   CESAREAN SECTION     KNEE SURGERY     arthroscopic left   TRANSFORAMINAL LUMBAR INTERBODY FUSION W/ MIS 2 LEVEL Right 05/21/2020   Procedure: LUMBAR FOUR-FIVE, LUMBAR FIVE-SACRAL ONE MINIMALLY INVASIVE TRANSFORAMINAL LUMBAR INTERBODY FUSION WITH INSTRUMENTATION LUMBAR TWO-SACRAL ONE;  Surgeon: Vallarie Mare, MD;  Location: [place];  Service: Neurosurgery;  Laterality: Right;    There were no vitals filed for this visit.   Subjective Assessment - 10/14/20 0954     Subjective Pt presents to PT with no current reports of pain. Pt has been compliant with HEP with no adverse effect. She is ready to begin PT at this time.    Currently in Pain? No/denies    Pain Score 0-No pain           [facility] Adult PT Treatment/Exercise:   Therapeutic Exercise: Nu-step L6, 69mwhile taking subjective Step up 2x10 6'' box with contralateral UE support Heel-Toe raises x 20 - R difficult 5 hurdle laps in // - step to - alternating lead Lateral walk red tband x 3 laps in //   The following therex was not completed today:   DF in sitting with russian stim to tib anterior,  10/10 cycle up to 70 ma, 10 min  Sit to stand -  from mat + airex min  UE support - 2x10 heel raises - 2x10 on foam Hip abd in // - red tband - 3x10 ea Standing march - on foam - 3x10  LAQ - 10# - R 3x10 Abdominal contraction + hip adduction in supine with ball - 5'' hold 10x Abdominal contraction + alternating clamshell with GTB - 10x Alternating supine march - GTB - 10x Bridge - 3x - partial ROM Bridge progression (next session) Step up R only   Neuromuscular re-ed: 3/4 tandem with head turns 2x30 sec FT EC on foam 2x30 sec                               PT Short Term Goals - 10/07/20 1254       PT SHORT TERM GOAL #1   Title Darlene Mclaughlin will be >75% HEP compliant within 3 weeks to  improve carryover between sessions and facilitate independent management of condition.    Status Achieved    Target Date 10/09/20               PT Long Term Goals - 09/18/20 1159       PT LONG TERM GOAL #1   Title Darlene Mclaughlin will improve FOTO score from 48 (on eval) to 52 as a proxy for functional improvement    Target Date 11/19/20      PT LONG TERM GOAL #2   Title Darlene Mclaughlin will be able to return to water aerobics, not limited by pain  EVAL: not able    Target Date 11/19/20      PT LONG TERM GOAL #3   Title Darlene Mclaughlin will improve max gait speed to .9 m/s (.1 m/s MCID) to show functional improvement in ambulation  EVAL: .625 m/s    Target Date 11/19/20      PT LONG TERM GOAL #4   Title Darlene Mclaughlin will improve 30'' STS (MCID 2) to >/= 10x to show improved LE strength and improved transfers  EVAL: 7x    Target Date 11/19/20      PT LONG TERM GOAL #5   Title Darlene Mclaughlin will be able to stand for >30'' in semi tandem stance, to show a significant improvement in balance in order to reduce fall risk  EVAL: <10'' in semi-tandem stance bil    Target Date 11/19/20                   Plan - 10/14/20 1024     Clinical Impression Statement Pt was able to complete prescribed exercises with no adverse effect. Today's session again focused on increasing LE stability and strength. She continues to circumduct R LE d/t R DF weakness. Pt continues to benefit from skilled PT services, working on improving strength and balance. Will continue to progress as tolerated per POC.    PT Treatment/Interventions ADLs/Self Care Home Management;Aquatic Therapy;Therapeutic exercise;Therapeutic activities;Neuromuscular re-education;Manual techniques;Iontophoresis '4mg'$ /ml Dexamethasone;Dry needling;Gait training    PT Next Visit Plan [place] R LE; balance progression, core strength progression, LE strength progression    PT Home Exercise Plan [facility]              Patient will benefit from skilled therapeutic intervention in order to improve the following deficits and impairments:  Decreased endurance, Abnormal gait, Decreased activity tolerance, Decreased strength, Pain, Decreased balance  Visit Diagnosis: Muscle weakness (generalized)  Other abnormalities of gait and mobility  Problem List Patient Active Problem List   Diagnosis Date Noted   Lumbar radiculopathy 05/21/2020   Statin intolerance 01/16/2018   Bilateral leg edema 12/21/2017   History of colonic polyps 10/18/2017   Dyslipidemia 10/18/2017   Essential hypertension 03/26/2016   Family history of colon cancer 03/26/2016   Osteopenia 08/28/2015   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 [place], [place], [address] Phone: [phone]   Fax:  [phone]  Name: Darlene Mclaughlin MRN: [id number] Date of Birth: December 07, 1950

## 2020-10-16 ENCOUNTER — Encounter: Payer: Medicare PPO | Admitting: Physical Therapy

## 2020-10-21 ENCOUNTER — Other Ambulatory Visit: Payer: Self-pay

## 2020-10-21 ENCOUNTER — Ambulatory Visit: Payer: Medicare PPO

## 2020-10-21 DIAGNOSIS — M6281 Muscle weakness (generalized): Secondary | ICD-10-CM

## 2020-10-21 DIAGNOSIS — R2689 Other abnormalities of gait and mobility: Secondary | ICD-10-CM

## 2020-10-21 DIAGNOSIS — M5441 Lumbago with sciatica, right side: Secondary | ICD-10-CM | POA: Diagnosis not present

## 2020-10-21 DIAGNOSIS — G8929 Other chronic pain: Secondary | ICD-10-CM | POA: Diagnosis not present

## 2020-10-21 DIAGNOSIS — R293 Abnormal posture: Secondary | ICD-10-CM | POA: Diagnosis not present

## 2020-10-21 DIAGNOSIS — M545 Low back pain, unspecified: Secondary | ICD-10-CM | POA: Diagnosis not present

## 2020-10-21 NOTE — Therapy (Signed)
Darlene Mclaughlin, [place], [address] Phone: [phone]   Fax:  [phone]  Physical Therapy Treatment  Patient Details  Name: Darlene Mclaughlin MRN: [id number] Date of Birth: 12/14/1950 Referring Provider (PT): Vallarie Mare, MD   Encounter Date: 10/21/2020   PT End of Session - 10/21/20 1049     Visit Number 9    Number of Visits 16    Date for PT Re-Evaluation 11/19/20    Authorization Type [employer] MCR - foto    PT Start Time [id number]    PT Stop Time 1130    PT Time Calculation (min) 43 min    Activity Tolerance Patient tolerated treatment well    Behavior During Therapy [facility] for tasks assessed/performed             Past Medical History:  Diagnosis Date   ALLERGIC RHINITIS 05/15/2008   Anemia    ASTHMA 05/15/2008   Asthma    Blood transfusion without reported diagnosis 1991   anemia   Breast cancer ([place]) 2016   right breast   Cancer ([place])    DCIS right breast   Diabetes mellitus without complication ([place])    GERD (gastroesophageal reflux disease)    Headache(784.0) 05/15/2008   HYPERTENSION 05/15/2008   IMPAIRED GLUCOSE TOLERANCE 05/15/2008   Obesity    OSTEOARTHRITIS 05/15/2008   back    Past Surgical History:  Procedure Laterality Date   ABDOMINAL HYSTERECTOMY     ANTERIOR LAT LUMBAR FUSION Left 05/21/2020   Procedure: LUMBAR TWO-THREE, LUMBAR THREE-FOUR DIRECT LATERAL LUMBAR INTERBODY FUSION;  Surgeon: Vallarie Mare, MD;  Location: [place];  Service: Neurosurgery;  Laterality: Left;   APPLICATION OF ROBOTIC ASSISTANCE FOR SPINAL PROCEDURE N/A 05/21/2020   Procedure: APPLICATION OF ROBOTIC ASSISTANCE FOR SPINAL PROCEDURE;  Surgeon: Vallarie Mare, MD;  Location: [place];  Service: Neurosurgery;  Laterality: N/A;   BREAST LUMPECTOMY Right 12/17/2014   BREAST LUMPECTOMY WITH RADIOACTIVE SEED LOCALIZATION Right 12/17/2014   Procedure: RIGHT BREAST RADIOACTIVE SEED GUIDED PARTIAL MASTECTOMY;   Surgeon: Erroll Luna, MD;  Location: [place];  Service: General;  Laterality: Right;   CESAREAN SECTION     KNEE SURGERY     arthroscopic left   TRANSFORAMINAL LUMBAR INTERBODY FUSION W/ MIS 2 LEVEL Right 05/21/2020   Procedure: LUMBAR FOUR-FIVE, LUMBAR FIVE-SACRAL ONE MINIMALLY INVASIVE TRANSFORAMINAL LUMBAR INTERBODY FUSION WITH INSTRUMENTATION LUMBAR TWO-SACRAL ONE;  Surgeon: Vallarie Mare, MD;  Location: [place];  Service: Neurosurgery;  Laterality: Right;    There were no vitals filed for this visit.   Subjective Assessment - 10/21/20 1049     Subjective Pt presents to PT with no reports of pain. Continues to be compliant with HEP with no adverse effect. She is ready to begin PT at this time.    Currently in Pain? No/denies    Pain Score 0-No pain           [facility] Adult PT Treatment/Exercise:   Therapeutic Exercise: Nu-step L6, 22mwhile taking subjective Step up x10 8'' box with contralateral UE support Heel Toe raises x20 5 hurdle laps in // - step to - alternating lead DF in sitting with russian stim to tib anterior,  10/10 cycle up to 39 ma, 10 min    The following therex was not completed today:   Sit to stand -  from mat + airex min UE support - 2x10 heel raises - 2x10 on foam Hip abd in // -  red tband - 3x10 ea Standing march - on foam - 3x10  LAQ - 10# - R 3x10 Abdominal contraction + hip adduction in supine with ball - 5'' hold 10x Abdominal contraction + alternating clamshell with GTB - 10x Alternating supine march - GTB - 10x Bridge - 3x - partial ROM Bridge progression (next session)    Neuromuscular re-ed: FT on foam 2x30 sec Tandem walk x 2 laps in //                              PT Short Term Goals - 10/07/20 1254       PT SHORT TERM GOAL #1   Title Darlene Mclaughlin will be >75% HEP compliant within 3 weeks to improve carryover between sessions and facilitate independent management of condition.     Status Achieved    Target Date 10/09/20               PT Long Term Goals - 09/18/20 1159       PT LONG TERM GOAL #1   Title Darlene Mclaughlin will improve FOTO score from 48 (on eval) to 52 as a proxy for functional improvement    Target Date 11/19/20      PT LONG TERM GOAL #2   Title Darlene Mclaughlin will be able to return to water aerobics, not limited by pain  EVAL: not able    Target Date 11/19/20      PT LONG TERM GOAL #3   Title Darlene Mclaughlin will improve max gait speed to .9 m/s (.1 m/s MCID) to show functional improvement in ambulation  EVAL: .625 m/s    Target Date 11/19/20      PT LONG TERM GOAL #4   Title Darlene Mclaughlin will improve 30'' STS (MCID 2) to >/= 10x to show improved LE strength and improved transfers  EVAL: 7x    Target Date 11/19/20      PT LONG TERM GOAL #5   Title Darlene Mclaughlin will be able to stand for >30'' in semi tandem stance, to show a significant improvement in balance in order to reduce fall risk  EVAL: <10'' in semi-tandem stance bil    Target Date 11/19/20                   Plan - 10/21/20 1050     Clinical Impression Statement Pt was able to complete prescribed exercises with no adverse effect. Continued [place] Stim today for stimulation of R anterior tib. Today we also focused on increasing LE strength and dynamic balance. Pt continues to benefit from skilled PT services and will continue to be seen and progressed as tolerated.    PT Treatment/Interventions ADLs/Self Care Home Management;Aquatic Therapy;Therapeutic exercise;Therapeutic activities;Neuromuscular re-education;Manual techniques;Iontophoresis '4mg'$ /ml Dexamethasone;Dry needling;Gait training    PT Next Visit Plan [place] R LE; balance progression, core strength progression, LE strength progression    PT Home Exercise Plan [id number]    Consulted and Agree with Plan of Care Patient             Patient will benefit from skilled therapeutic  intervention in order to improve the following deficits and impairments:  Decreased endurance, Abnormal gait, Decreased activity tolerance, Decreased strength, Pain, Decreased balance  Visit Diagnosis: Muscle weakness (generalized)  Other abnormalities of gait and mobility     Problem List Patient Active Problem List   Diagnosis Date Noted  Lumbar radiculopathy 05/21/2020   Statin intolerance 01/16/2018   Bilateral leg edema 12/21/2017   History of colonic polyps 10/18/2017   Dyslipidemia 10/18/2017   Essential hypertension 03/26/2016   Family history of colon cancer 03/26/2016   Osteopenia 08/28/2015   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 [place], [place], [address] Phone: [phone]   Fax:  [phone]  Name: VIDETTE WILKINSON MRN: [id number] Date of Birth: [birth year]

## 2020-10-23 ENCOUNTER — Ambulatory Visit: Payer: Medicare PPO | Attending: Neurosurgery | Admitting: Physical Therapy

## 2020-10-23 ENCOUNTER — Encounter: Payer: Self-pay | Admitting: Physical Therapy

## 2020-10-23 ENCOUNTER — Other Ambulatory Visit: Payer: Self-pay

## 2020-10-23 DIAGNOSIS — R293 Abnormal posture: Secondary | ICD-10-CM | POA: Diagnosis not present

## 2020-10-23 DIAGNOSIS — M545 Low back pain, unspecified: Secondary | ICD-10-CM | POA: Insufficient documentation

## 2020-10-23 DIAGNOSIS — M6281 Muscle weakness (generalized): Secondary | ICD-10-CM | POA: Diagnosis not present

## 2020-10-23 DIAGNOSIS — J3089 Other allergic rhinitis: Secondary | ICD-10-CM | POA: Diagnosis not present

## 2020-10-23 DIAGNOSIS — G8929 Other chronic pain: Secondary | ICD-10-CM | POA: Insufficient documentation

## 2020-10-23 DIAGNOSIS — R2689 Other abnormalities of gait and mobility: Secondary | ICD-10-CM | POA: Insufficient documentation

## 2020-10-23 DIAGNOSIS — M5441 Lumbago with sciatica, right side: Secondary | ICD-10-CM | POA: Insufficient documentation

## 2020-10-23 DIAGNOSIS — J301 Allergic rhinitis due to pollen: Secondary | ICD-10-CM | POA: Diagnosis not present

## 2020-10-23 NOTE — Therapy (Signed)
[place], [place], [address] Phone: [phone]   Fax:  [phone]  Progress Note Reporting Period 7/28 to 9/1  See note below for Objective Data and Assessment of Progress/Goals.     Physical Therapy Treatment  Patient Details  Name: Darlene Mclaughlin MRN: [id number] Date of Birth: 1950/05/21 Referring Provider (PT): Vallarie Mare, MD   Encounter Date: 10/23/2020   PT End of Session - 10/23/20 1045     Visit Number 10    Number of Visits 20    Date for PT Re-Evaluation 11/19/20    Authorization Type [employer] MCR - foto    PT Start Time 7782    PT Stop Time 1129    PT Time Calculation (min) 43 min    Activity Tolerance Patient tolerated treatment well    Behavior During Therapy [facility] for tasks assessed/performed             Past Medical History:  Diagnosis Date   ALLERGIC RHINITIS 05/15/2008   Anemia    ASTHMA 05/15/2008   Asthma    Blood transfusion without reported diagnosis 1991   anemia   Breast cancer ([place]) 2016   right breast   Cancer ([place])    DCIS right breast   Diabetes mellitus without complication ([place])    GERD (gastroesophageal reflux disease)    Headache(784.0) 05/15/2008   HYPERTENSION 05/15/2008   IMPAIRED GLUCOSE TOLERANCE 05/15/2008   Obesity    OSTEOARTHRITIS 05/15/2008   back    Past Surgical History:  Procedure Laterality Date   ABDOMINAL HYSTERECTOMY     ANTERIOR LAT LUMBAR FUSION Left 05/21/2020   Procedure: LUMBAR TWO-THREE, LUMBAR THREE-FOUR DIRECT LATERAL LUMBAR INTERBODY FUSION;  Surgeon: Vallarie Mare, MD;  Location: [place];  Service: Neurosurgery;  Laterality: Left;   APPLICATION OF ROBOTIC ASSISTANCE FOR SPINAL PROCEDURE N/A 05/21/2020   Procedure: APPLICATION OF ROBOTIC ASSISTANCE FOR SPINAL PROCEDURE;  Surgeon: Vallarie Mare, MD;  Location: [place];  Service: Neurosurgery;  Laterality: N/A;   BREAST LUMPECTOMY Right 12/17/2014   BREAST LUMPECTOMY  WITH RADIOACTIVE SEED LOCALIZATION Right 12/17/2014   Procedure: RIGHT BREAST RADIOACTIVE SEED GUIDED PARTIAL MASTECTOMY;  Surgeon: Erroll Luna, MD;  Location: [place];  Service: General;  Laterality: Right;   CESAREAN SECTION     KNEE SURGERY     arthroscopic left   TRANSFORAMINAL LUMBAR INTERBODY FUSION W/ MIS 2 LEVEL Right 05/21/2020   Procedure: LUMBAR FOUR-FIVE, LUMBAR FIVE-SACRAL ONE MINIMALLY INVASIVE TRANSFORAMINAL LUMBAR INTERBODY FUSION WITH INSTRUMENTATION LUMBAR TWO-SACRAL ONE;  Surgeon: Vallarie Mare, MD;  Location: [place];  Service: Neurosurgery;  Laterality: Right;    There were no vitals filed for this visit.   Subjective Assessment - 10/23/20 1051     Subjective Pt reports she feels like she is progressing well.  She has reduced pain.  Improved transfers, improved ambulation distance, able to pick items up more easily.  She reports 3-4/10 LBP Aggs: prolonged positions Eases: rest    Pertinent History lumbar fusion late March 2022, DM TII, hx of breast cancer            FOTO: 69  [facility] Adult PT Treatment/Exercise:   Therapeutic Exercise: Nu-step L6, 99mwhile taking subjective DF in sitting with russian stim to tib anterior,  10/10 cycle up to 39 ma, 10 min    The following therex was not completed today:   Step up x10 8'' box with contralateral UE support Heel  Toe raises x20 5 hurdle laps in // - step to - alternating lead Sit to stand -  from mat + airex min UE support - 2x10 heel raises - 2x10 on foam Hip abd in // - red tband - 3x10 ea Standing march - on foam - 3x10  LAQ - 10# - R 3x10 Abdominal contraction + hip adduction in supine with ball - 5'' hold 10x Abdominal contraction + alternating clamshell with GTB - 10x Alternating supine march - GTB - 10x Bridge - 3x - partial ROM Bridge progression (next session)     Neuromuscular re-ed (NOT TODAY): FT on foam 2x30 sec Tandem walk x 2 laps in //  Therapeutic Activity -  collecting information for goals, checking, and reviewing with patient      PT Short Term Goals - 10/07/20 1254       PT SHORT TERM GOAL #1   Title Darlene Mclaughlin will be >75% HEP compliant within 3 weeks to improve carryover between sessions and facilitate independent management of condition.    Status Achieved    Target Date 10/09/20               PT Long Term Goals - 10/23/20 1052       PT LONG TERM GOAL #1   Title Darlene Mclaughlin will improve FOTO score from 48 (on eval) to 52 as a proxy for functional improvement    Baseline 9/1: 43.2    Status Partially Met    Target Date 11/19/20      PT LONG TERM GOAL #2   Title Darlene Mclaughlin will be able to return to water aerobics, not limited by pain  EVAL: not able    Baseline 9/1:  "I feel like I'm getting there"    Status On-going    Target Date 11/19/20      PT LONG TERM GOAL #3   Title Darlene Mclaughlin will improve max gait speed to .9 m/s (.1 m/s MCID) to show functional improvement in ambulation  EVAL: .625 m/s    Baseline 9/1: .77 m/s    Status On-going    Target Date 11/19/20      PT LONG TERM GOAL #4   Title Darlene Mclaughlin will improve 30'' STS (MCID 2) to >/= 10x to show improved LE strength and improved transfers  EVAL: 7x    Baseline 9/1: MET 10x    Status Achieved    Target Date 11/19/20      PT LONG TERM GOAL #5   Title Darlene Mclaughlin will be able to stand for >30'' in semi tandem stance, to show a significant improvement in balance in order to reduce fall risk  EVAL: <10'' in semi-tandem stance bil    Baseline 9/1: MET bil    Status Achieved    Target Date 11/19/20                   Plan - 10/23/20 1126     Clinical Impression Statement Darlene Mclaughlin has progressed well with therapy.  Improved impairments include: LE strength, core strength, gait, balance.  Functional improvements include: ambulation in community, transfers, ability to go on vacation.  Progressions  needed include: resume water aerobics, core strengthening, continue trying to improve DF.  Barriers to progress include: possible sustained weakness of DF.  Please see baseline and/or status section in "Goals" for specific progress on short term and long term goals established at evaluation.  I  recommend continuation of PT allow completion of remaining goals and continued functional progression.    PT Frequency 2x / week    PT Duration 4 weeks    PT Treatment/Interventions ADLs/Self Care Home Management;Aquatic Therapy;Therapeutic exercise;Therapeutic activities;Neuromuscular re-education;Manual techniques;Iontophoresis 61m/ml Dexamethasone;Dry needling;Gait training    PT Next Visit Plan [place] R LE; balance progression, core strength progression, LE strength progression    PT Home Exercise Plan [email or web address]   Consulted and Agree with Plan of Care Patient             Patient will benefit from skilled therapeutic intervention in order to improve the following deficits and impairments:  Decreased endurance, Abnormal gait, Decreased activity tolerance, Decreased strength, Pain, Decreased balance  Visit Diagnosis: Muscle weakness (generalized) - Plan: PT plan of care cert/re-cert  Other abnormalities of gait and mobility - Plan: PT plan of care cert/re-cert  Chronic right-sided low back pain, unspecified whether sciatica present - Plan: PT plan of care cert/re-cert  Acute bilateral low back pain with right-sided sciatica - Plan: PT plan of care cert/re-cert  Abnormal posture - Plan: PT plan of care cert/re-cert     Problem List Patient Active Problem List   Diagnosis Date Noted   Lumbar radiculopathy 05/21/2020   Statin intolerance 01/16/2018   Bilateral leg edema 12/21/2017   History of colonic polyps 10/18/2017   Dyslipidemia 10/18/2017   Essential hypertension 03/26/2016   Family history of colon cancer 03/26/2016   Osteopenia 08/28/2015   Ductal carcinoma in situ (DCIS) of  right breast 11/26/2014   Spinal stenosis of lumbar region 12/05/2013   [place] 12/19/2012   Di[place] 05/15/2008   Allergic rhinitis 05/15/2008   Asthma, mild intermittent 05/15/2008   Osteoarthritis 05/15/2008   HEADACHE 05/15/2008    KShearon BaloPT, DPT 10/23/20 11:45 AM  [facility]GDeer [place] [address]Phone: [phone]  Fax:  [phone] Name: Darlene CHRISTOFFERSENMRN: [id number] of Birth: [birth year]

## 2020-10-28 ENCOUNTER — Ambulatory Visit: Payer: Medicare PPO

## 2020-10-28 ENCOUNTER — Other Ambulatory Visit: Payer: Self-pay

## 2020-10-28 DIAGNOSIS — M6281 Muscle weakness (generalized): Secondary | ICD-10-CM

## 2020-10-28 DIAGNOSIS — G8929 Other chronic pain: Secondary | ICD-10-CM | POA: Diagnosis not present

## 2020-10-28 DIAGNOSIS — R293 Abnormal posture: Secondary | ICD-10-CM | POA: Diagnosis not present

## 2020-10-28 DIAGNOSIS — R2689 Other abnormalities of gait and mobility: Secondary | ICD-10-CM

## 2020-10-28 DIAGNOSIS — M5441 Lumbago with sciatica, right side: Secondary | ICD-10-CM | POA: Diagnosis not present

## 2020-10-28 DIAGNOSIS — M545 Low back pain, unspecified: Secondary | ICD-10-CM

## 2020-10-28 NOTE — Therapy (Signed)
[place], [place], [address] Phone: [phone]   Fax:  [phone]  Physical Therapy Treatment  Patient Details  Name: Darlene Mclaughlin MRN: [id number] Date of Birth: Dec 05, 1950 Referring Provider (PT): Vallarie Mare, MD   Encounter Date: 10/28/2020   PT End of Session - 10/28/20 1135     Visit Number 11    Number of Visits 20    Date for PT Re-Evaluation 11/19/20    Authorization Type [employer] MCR - foto    PT Start Time 1130    PT Stop Time 5320    PT Time Calculation (min) 54 min    Activity Tolerance Patient tolerated treatment well    Behavior During Therapy [facility] for tasks assessed/performed             Past Medical History:  Diagnosis Date   ALLERGIC RHINITIS 05/15/2008   Anemia    ASTHMA 05/15/2008   Asthma    Blood transfusion without reported diagnosis 1991   anemia   Breast cancer ([place]) 2016   right breast   Cancer ([place])    DCIS right breast   Diabetes mellitus without complication ([place])    GERD (gastroesophageal reflux disease)    Headache(784.0) 05/15/2008   HYPERTENSION 05/15/2008   IMPAIRED GLUCOSE TOLERANCE 05/15/2008   Obesity    OSTEOARTHRITIS 05/15/2008   back    Past Surgical History:  Procedure Laterality Date   ABDOMINAL HYSTERECTOMY     ANTERIOR LAT LUMBAR FUSION Left 05/21/2020   Procedure: LUMBAR TWO-THREE, LUMBAR THREE-FOUR DIRECT LATERAL LUMBAR INTERBODY FUSION;  Surgeon: Vallarie Mare, MD;  Location: [place];  Service: Neurosurgery;  Laterality: Left;   APPLICATION OF ROBOTIC ASSISTANCE FOR SPINAL PROCEDURE N/A 05/21/2020   Procedure: APPLICATION OF ROBOTIC ASSISTANCE FOR SPINAL PROCEDURE;  Surgeon: Vallarie Mare, MD;  Location: [place];  Service: Neurosurgery;  Laterality: N/A;   BREAST LUMPECTOMY Right 12/17/2014   BREAST LUMPECTOMY WITH RADIOACTIVE SEED LOCALIZATION Right 12/17/2014   Procedure: RIGHT BREAST RADIOACTIVE SEED GUIDED PARTIAL MASTECTOMY;   Surgeon: Erroll Luna, MD;  Location: [place];  Service: General;  Laterality: Right;   CESAREAN SECTION     KNEE SURGERY     arthroscopic left   TRANSFORAMINAL LUMBAR INTERBODY FUSION W/ MIS 2 LEVEL Right 05/21/2020   Procedure: LUMBAR FOUR-FIVE, LUMBAR FIVE-SACRAL ONE MINIMALLY INVASIVE TRANSFORAMINAL LUMBAR INTERBODY FUSION WITH INSTRUMENTATION LUMBAR TWO-SACRAL ONE;  Surgeon: Vallarie Mare, MD;  Location: [place];  Service: Neurosurgery;  Laterality: Right;    There were no vitals filed for this visit.   Subjective Assessment - 10/28/20 1135     Subjective Pt presents to PT with reports of increased LBP today which she relates to weather. She continues compliance with HEP with no adverse effect. Pt is ready to begin PT at this time.    Currently in Pain? Yes    Pain Score 6     Pain Location Back    Pain Orientation Lower    Pain Descriptors / Indicators Aching           [facility] Adult PT Treatment/Exercise:   Therapeutic Exercise: Nu-step L6, 39m while taking subjective Lateral walk RTB x 3 laps in // Step up x10 8'' box with contralateral UE Standing hip ext 2x10 RTB  Bridge partial ROM x 10 DF in sitting with russian stim to tib anterior,  10/10 cycle up to 55 ma, 10 min    The following therex was  not completed today:   Heel Toe raises x20 5 hurdle laps in // - step to - alternating lead Sit to stand -  from mat + airex min UE support - 2x10 heel raises - 2x10 on foam Standing march - on foam - 3x10  LAQ - 10# - R 3x10 Abdominal contraction + hip adduction in supine with ball - 5'' hold 10x Abdominal contraction + alternating clamshell with GTB - 10x Alternating supine march - GTB - 10x     Neuromuscular re-ed: FT on foam 2x30 sec March on foam 2 x 20 Tandem walk in // x 3                                PT Short Term Goals - 10/07/20 1254       PT SHORT TERM GOAL #1   Title Darlene Mclaughlin will be >75% HEP  compliant within 3 weeks to improve carryover between sessions and facilitate independent management of condition.    Status Achieved    Target Date 10/09/20               PT Long Term Goals - 10/23/20 1052       PT LONG TERM GOAL #1   Title Darlene Mclaughlin will improve FOTO score from 48 (on eval) to 52 as a proxy for functional improvement    Baseline 9/1: 43.2    Status Partially Met    Target Date 11/19/20      PT LONG TERM GOAL #2   Title Darlene Mclaughlin will be able to return to water aerobics, not limited by pain  EVAL: not able    Baseline 9/1:  "I feel like I'm getting there"    Status On-going    Target Date 11/19/20      PT LONG TERM GOAL #3   Title Darlene Mclaughlin will improve max gait speed to .9 m/s (.1 m/s MCID) to show functional improvement in ambulation  EVAL: .625 m/s    Baseline 9/1: .77 m/s    Status On-going    Target Date 11/19/20      PT LONG TERM GOAL #4   Title Darlene Mclaughlin will improve 30'' STS (MCID 2) to >/= 10x to show improved LE strength and improved transfers  EVAL: 7x    Baseline 9/1: MET 10x    Status Achieved    Target Date 11/19/20      PT LONG TERM GOAL #5   Title Darlene Mclaughlin will be able to stand for >30'' in semi tandem stance, to show a significant improvement in balance in order to reduce fall risk  EVAL: <10'' in semi-tandem stance bil    Baseline 9/1: MET bil    Status Achieved    Target Date 11/19/20                   Plan - 10/28/20 1136     Clinical Impression Statement Pt was able to complete prescribed exercises, but did have slight decrease in tolerance today d/t increase in pain. Today we continued focus on standing strengthening as well as improving balance and ESTIM for increased neuromuscular activation. Pt overall continues to benefit from skilled PT services and will continue to be seen and progressed as tolerated.    PT Treatment/Interventions ADLs/Self Care Home Management;Aquatic  Therapy;Therapeutic exercise;Therapeutic activities;Neuromuscular re-education;Manual techniques;Iontophoresis 109m/ml Dexamethasone;Dry needling;Gait training  PT Next Visit Plan [place] R LE; balance progression, core strength progression, LE strength progression    PT Home Exercise Plan [facility]             Patient will benefit from skilled therapeutic intervention in order to improve the following deficits and impairments:  Decreased endurance, Abnormal gait, Decreased activity tolerance, Decreased strength, Pain, Decreased balance  Visit Diagnosis: Muscle weakness (generalized)  Other abnormalities of gait and mobility  Chronic right-sided low back pain, unspecified whether sciatica present     Problem List Patient Active Problem List   Diagnosis Date Noted   Lumbar radiculopathy 05/21/2020   Statin intolerance 01/16/2018   Bilateral leg edema 12/21/2017   History of colonic polyps 10/18/2017   Dyslipidemia 10/18/2017   Essential hypertension 03/26/2016   Family history of colon cancer 03/26/2016   Osteopenia 08/28/2015   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 [place], [place], [address] Phone: [phone]   Fax:  [phone]  Name: Darlene Mclaughlin MRN: [id number] Date of Birth: 04-27-1950

## 2020-10-30 ENCOUNTER — Encounter: Payer: Self-pay | Admitting: Physical Therapy

## 2020-10-30 ENCOUNTER — Ambulatory Visit: Payer: Medicare PPO | Admitting: Physical Therapy

## 2020-10-30 ENCOUNTER — Other Ambulatory Visit: Payer: Self-pay

## 2020-10-30 DIAGNOSIS — R2689 Other abnormalities of gait and mobility: Secondary | ICD-10-CM

## 2020-10-30 DIAGNOSIS — R293 Abnormal posture: Secondary | ICD-10-CM

## 2020-10-30 DIAGNOSIS — M545 Low back pain, unspecified: Secondary | ICD-10-CM | POA: Diagnosis not present

## 2020-10-30 DIAGNOSIS — M6281 Muscle weakness (generalized): Secondary | ICD-10-CM | POA: Diagnosis not present

## 2020-10-30 DIAGNOSIS — M5441 Lumbago with sciatica, right side: Secondary | ICD-10-CM

## 2020-10-30 DIAGNOSIS — G8929 Other chronic pain: Secondary | ICD-10-CM | POA: Diagnosis not present

## 2020-10-30 NOTE — Therapy (Signed)
[place], [place], [address] Phone: [phone]   Fax:  [phone]  Physical Therapy Treatment  Patient Details  Name: Darlene Mclaughlin MRN: [id number] Date of Birth: Aug 26, 1950 Referring Provider (PT): Vallarie Mare, MD   Encounter Date: 10/30/2020   PT End of Session - 10/30/20 1042     Visit Number 12    Number of Visits 20    Date for PT Re-Evaluation 11/19/20    Authorization Type [employer] MCR - foto    PT Start Time 9233    PT Stop Time 1129    PT Time Calculation (min) 44 min    Activity Tolerance Patient tolerated treatment well    Behavior During Therapy [facility] for tasks assessed/performed             Past Medical History:  Diagnosis Date   ALLERGIC RHINITIS 05/15/2008   Anemia    ASTHMA 05/15/2008   Asthma    Blood transfusion without reported diagnosis 1991   anemia   Breast cancer ([place]) 2016   right breast   Cancer ([place])    DCIS right breast   Diabetes mellitus without complication ([place])    GERD (gastroesophageal reflux disease)    Headache(784.0) 05/15/2008   HYPERTENSION 05/15/2008   IMPAIRED GLUCOSE TOLERANCE 05/15/2008   Obesity    OSTEOARTHRITIS 05/15/2008   back    Past Surgical History:  Procedure Laterality Date   ABDOMINAL HYSTERECTOMY     ANTERIOR LAT LUMBAR FUSION Left 05/21/2020   Procedure: LUMBAR TWO-THREE, LUMBAR THREE-FOUR DIRECT LATERAL LUMBAR INTERBODY FUSION;  Surgeon: Vallarie Mare, MD;  Location: [place];  Service: Neurosurgery;  Laterality: Left;   APPLICATION OF ROBOTIC ASSISTANCE FOR SPINAL PROCEDURE N/A 05/21/2020   Procedure: APPLICATION OF ROBOTIC ASSISTANCE FOR SPINAL PROCEDURE;  Surgeon: Vallarie Mare, MD;  Location: [place];  Service: Neurosurgery;  Laterality: N/A;   BREAST LUMPECTOMY Right 12/17/2014   BREAST LUMPECTOMY WITH RADIOACTIVE SEED LOCALIZATION Right 12/17/2014   Procedure: RIGHT BREAST RADIOACTIVE SEED GUIDED PARTIAL MASTECTOMY;   Surgeon: Erroll Luna, MD;  Location: [place];  Service: General;  Laterality: Right;   CESAREAN SECTION     KNEE SURGERY     arthroscopic left   TRANSFORAMINAL LUMBAR INTERBODY FUSION W/ MIS 2 LEVEL Right 05/21/2020   Procedure: LUMBAR FOUR-FIVE, LUMBAR FIVE-SACRAL ONE MINIMALLY INVASIVE TRANSFORAMINAL LUMBAR INTERBODY FUSION WITH INSTRUMENTATION LUMBAR TWO-SACRAL ONE;  Surgeon: Vallarie Mare, MD;  Location: [place];  Service: Neurosurgery;  Laterality: Right;    There were no vitals filed for this visit.   Subjective Assessment - 10/30/20 1049     Subjective Pt reports that she is a bit sore this morning possibly d/t the rain.  She feels that overall she is improving.  She feels more confident in gait.  She reports 3-4/10 LBP Aggs: prolonged positions Eases: rest    Pertinent History lumbar fusion late March 2022, DM TII, hx of breast cancer                                       [facility] Adult PT Treatment/Exercise:   Therapeutic Exercise to improve core and LE strength to reduce pain and improve functional mobility.  Pt cued for form and pacing throughout: Nu-step L7, 58mwhile taking subjective Lateral walk RTB x 3 laps in // Step up 2x6 ea 8'' box with  contralateral UE Mini squats - 2x10 Resisted cable walk out - backwards - 27# - 7x (CGA) Sled push - from front to back room - 1.5x _0 # added   The following therex was not completed today:   -DF in sitting with russian stim to tib anterior,  10/10 cycle up to 55 ma, 10 min  Heel Toe raises x20 5 hurdle laps in // - step to - alternating lead Sit to stand -  from mat + airex min UE support - 2x10 heel raises - 2x10 on foam Standing march - on foam - 3x10  LAQ - 10# - R 3x10 Abdominal contraction + hip adduction in supine with ball - 5'' hold 10x Abdominal contraction + alternating clamshell with GTB - 10x Alternating supine march - GTB - 10x     PT Short Term Goals -  10/07/20 1254       PT SHORT TERM GOAL #1   Title Darlene Mclaughlin will be >75% HEP compliant within 3 weeks to improve carryover between sessions and facilitate independent management of condition.    Status Achieved    Target Date 10/09/20               PT Long Term Goals - 10/23/20 1052       PT LONG TERM GOAL #1   Title Darlene Mclaughlin will improve FOTO score from 48 (on eval) to 52 as a proxy for functional improvement    Baseline 9/1: 43.2    Status Partially Met    Target Date 11/19/20      PT LONG TERM GOAL #2   Title Darlene Mclaughlin will be able to return to water aerobics, not limited by pain  EVAL: not able    Baseline 9/1:  "I feel like I'm getting there"    Status On-going    Target Date 11/19/20      PT LONG TERM GOAL #3   Title Darlene Mclaughlin will improve max gait speed to .9 m/s (.1 m/s MCID) to show functional improvement in ambulation  EVAL: .625 m/s    Baseline 9/1: .77 m/s    Status On-going    Target Date 11/19/20      PT LONG TERM GOAL #4   Title Darlene Mclaughlin will improve 30'' STS (MCID 2) to >/= 10x to show improved LE strength and improved transfers  EVAL: 7x    Baseline 9/1: MET 10x    Status Achieved    Target Date 11/19/20      PT LONG TERM GOAL #5   Title Darlene Mclaughlin will be able to stand for >30'' in semi tandem stance, to show a significant improvement in balance in order to reduce fall risk  EVAL: <10'' in semi-tandem stance bil    Baseline 9/1: MET bil    Status Achieved    Target Date 11/19/20                   Plan - 10/30/20 1125     Clinical Impression Statement Pt reports no increase in baseline pain following therapy  HEP was reviewed, but left unchanged    Overall, Darlene Mclaughlin is progressing well with therapy.  Today we concentrated on lower extremity strengthening, hip strengthening, and functional strengthening.  Pt pt shows quad weakness compensation with min-squat, we will concentrate  on more direct quad strengthening during next appts.  She plans on getting DF assist brace.  Pt will continue  to benefit from skilled physical therapy to address remaining deficits and achieve listed goals.  Continue per POC.    PT Treatment/Interventions ADLs/Self Care Home Management;Aquatic Therapy;Therapeutic exercise;Therapeutic activities;Neuromuscular re-education;Manual techniques;Iontophoresis 47m/ml Dexamethasone;Dry needling;Gait training    PT Next Visit Plan Russian R LE; balance progression, core strength progression, LE strength progression    PT [facility]            Patient will benefit from skilled therapeutic intervention in order to improve the following deficits and impairments:  Decreased endurance, Abnormal gait, Decreased activity tolerance, Decreased strength, Pain, Decreased balance  Visit Diagnosis: Muscle weakness (generalized)  Other abnormalities of gait and mobility  Chronic right-sided low back pain, unspecified whether sciatica present  Acute bilateral low back pain with right-sided sciatica  Abnormal posture     Problem List Patient Active Problem List   Diagnosis Date Noted   Lumbar radiculopathy 05/21/2020   Statin intolerance 01/16/2018   Bilateral leg edema 12/21/2017   History of colonic polyps 10/18/2017   Dyslipidemia 10/18/2017   Essential hypertension 03/26/2016   Family history of colon cancer 03/26/2016   Osteopenia 08/28/2015   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

## 2020-10-31 DIAGNOSIS — J301 Allergic rhinitis due to pollen: Secondary | ICD-10-CM | POA: Diagnosis not present

## 2020-11-03 ENCOUNTER — Ambulatory Visit: Payer: Medicare PPO

## 2020-11-03 ENCOUNTER — Other Ambulatory Visit: Payer: Self-pay

## 2020-11-03 DIAGNOSIS — G8929 Other chronic pain: Secondary | ICD-10-CM

## 2020-11-03 DIAGNOSIS — M5441 Lumbago with sciatica, right side: Secondary | ICD-10-CM | POA: Diagnosis not present

## 2020-11-03 DIAGNOSIS — R2689 Other abnormalities of gait and mobility: Secondary | ICD-10-CM | POA: Diagnosis not present

## 2020-11-03 DIAGNOSIS — M6281 Muscle weakness (generalized): Secondary | ICD-10-CM

## 2020-11-03 DIAGNOSIS — R293 Abnormal posture: Secondary | ICD-10-CM | POA: Diagnosis not present

## 2020-11-03 DIAGNOSIS — M545 Low back pain, unspecified: Secondary | ICD-10-CM

## 2020-11-03 NOTE — Therapy (Signed)
[place], [place], [address] Phone: [phone]   Fax:  [phone]  Physical Therapy Treatment  Patient Details  Name: Darlene Mclaughlin MRN: [id number] Date of Birth: Feb 17, 1951 Referring Provider (PT): Vallarie Mare, MD   Encounter Date: 11/03/2020   PT End of Session - 11/03/20 1213     Visit Number 13    Number of Visits 20    Date for PT Re-Evaluation 11/19/20    Authorization Type [employer] MCR - foto    PT Start Time 1215    PT Stop Time 2595    PT Time Calculation (min) 40 min    Activity Tolerance Patient tolerated treatment well    Behavior During Therapy [facility] for tasks assessed/performed             Past Medical History:  Diagnosis Date   ALLERGIC RHINITIS 05/15/2008   Anemia    ASTHMA 05/15/2008   Asthma    Blood transfusion without reported diagnosis 1991   anemia   Breast cancer ([place]) 2016   right breast   Cancer ([place])    DCIS right breast   Diabetes mellitus without complication ([place])    GERD (gastroesophageal reflux disease)    Headache(784.0) 05/15/2008   HYPERTENSION 05/15/2008   IMPAIRED GLUCOSE TOLERANCE 05/15/2008   Obesity    OSTEOARTHRITIS 05/15/2008   back    Past Surgical History:  Procedure Laterality Date   ABDOMINAL HYSTERECTOMY     ANTERIOR LAT LUMBAR FUSION Left 05/21/2020   Procedure: LUMBAR TWO-THREE, LUMBAR THREE-FOUR DIRECT LATERAL LUMBAR INTERBODY FUSION;  Surgeon: Vallarie Mare, MD;  Location: [place];  Service: Neurosurgery;  Laterality: Left;   APPLICATION OF ROBOTIC ASSISTANCE FOR SPINAL PROCEDURE N/A 05/21/2020   Procedure: APPLICATION OF ROBOTIC ASSISTANCE FOR SPINAL PROCEDURE;  Surgeon: Vallarie Mare, MD;  Location: [place];  Service: Neurosurgery;  Laterality: N/A;   BREAST LUMPECTOMY Right 12/17/2014   BREAST LUMPECTOMY WITH RADIOACTIVE SEED LOCALIZATION Right 12/17/2014   Procedure: RIGHT BREAST RADIOACTIVE SEED GUIDED PARTIAL MASTECTOMY;   Surgeon: Erroll Luna, MD;  Location: [place];  Service: General;  Laterality: Right;   CESAREAN SECTION     KNEE SURGERY     arthroscopic left   TRANSFORAMINAL LUMBAR INTERBODY FUSION W/ MIS 2 LEVEL Right 05/21/2020   Procedure: LUMBAR FOUR-FIVE, LUMBAR FIVE-SACRAL ONE MINIMALLY INVASIVE TRANSFORAMINAL LUMBAR INTERBODY FUSION WITH INSTRUMENTATION LUMBAR TWO-SACRAL ONE;  Surgeon: Vallarie Mare, MD;  Location: [place];  Service: Neurosurgery;  Laterality: Right;    There were no vitals filed for this visit.   Subjective Assessment - 11/03/20 1213     Subjective Pt presents to PT with lace up AFO to trial on R foot. She has been compliant with HEP with no adverse effect. Pt is ready to begin PT at this time.    Currently in Pain? No/denies    Pain Score 0-No pain           [facility] Adult PT Treatment/Exercise:   Therapeutic Exercise to improve core and LE strength to reduce pain and improve functional mobility.  Pt cued for form and pacing throughout: Nu-step L7, 37mwhile taking subjective Step ups 6in 2x10 al Hurdle step over x 5 - x 2 Lateral walk RTB x 3 laps in // Step up 2x6 ea 8'' box with contralateral UE Mini squats - 2x10 Resisted cable walk out - backwards - 27# - 7x (CGA) Amb 758fx 2 w/ lace up  AFO  Total gym leg press x 10 - 25lb (difficult for pt)   The following therex was not completed today:   DF in sitting with russian stim to tib anterior,  10/10 cycle up to 55 ma, 10 min  Heel Toe raises x20 5 hurdle laps in // - step to - alternating lead Sit to stand -  from mat + airex min UE support - 2x10 heel raises - 2x10 on foam Standing march - on foam - 3x10  LAQ - 10# - R 3x10 Abdominal contraction + hip adduction in supine with ball - 5'' hold 10x Abdominal contraction + alternating clamshell with GTB - 10x Alternating supine march - GTB - 10x Sled push - from front to back room - 1.5x _0 #  added                               PT Short Term Goals - 10/07/20 1254       PT SHORT TERM GOAL #1   Title Darlene Mclaughlin will be >75% HEP compliant within 3 weeks to improve carryover between sessions and facilitate independent management of condition.    Status Achieved    Target Date 10/09/20               PT Long Term Goals - 10/23/20 1052       PT LONG TERM GOAL #1   Title Darlene Mclaughlin will improve FOTO score from 48 (on eval) to 52 as a proxy for functional improvement    Baseline 9/1: 43.2    Status Partially Met    Target Date 11/19/20      PT LONG TERM GOAL #2   Title Darlene Mclaughlin will be able to return to water aerobics, not limited by pain  EVAL: not able    Baseline 9/1:  "I feel like I'm getting there"    Status On-going    Target Date 11/19/20      PT LONG TERM GOAL #3   Title Darlene Mclaughlin will improve max gait speed to .9 m/s (.1 m/s MCID) to show functional improvement in ambulation  EVAL: .625 m/s    Baseline 9/1: .77 m/s    Status On-going    Target Date 11/19/20      PT LONG TERM GOAL #4   Title Darlene Mclaughlin will improve 30'' STS (MCID 2) to >/= 10x to show improved LE strength and improved transfers  EVAL: 7x    Baseline 9/1: MET 10x    Status Achieved    Target Date 11/19/20      PT LONG TERM GOAL #5   Title Darlene Mclaughlin will be able to stand for >30'' in semi tandem stance, to show a significant improvement in balance in order to reduce fall risk  EVAL: <10'' in semi-tandem stance bil    Baseline 9/1: MET bil    Status Achieved    Target Date 11/19/20                   Plan - 11/03/20 1348     Clinical Impression Statement Pt was able to complete prescribed exercises with no adverse effect. Trial of lace up AFO on R LE performed with demonstrated improved foot clearance and safety. Today we also continued to concentrate on increasing gait stability and LE strength. PT  emphasized increased safety using lace up AFO during community navigation.  PT will continue to progress exercises as tolerated per POC.    PT Treatment/Interventions ADLs/Self Care Home Management;Aquatic Therapy;Therapeutic exercise;Therapeutic activities;Neuromuscular re-education;Manual techniques;Iontophoresis 66m/ml Dexamethasone;Dry needling;Gait training    PT Next Visit Plan Russian R LE; balance progression, core strength progression, LE strength progression    PT Home Exercise Plan [facility]            Patient will benefit from skilled therapeutic intervention in order to improve the following deficits and impairments:  Decreased endurance, Abnormal gait, Decreased activity tolerance, Decreased strength, Pain, Decreased balance  Visit Diagnosis: Muscle weakness (generalized)  Other abnormalities of gait and mobility  Chronic right-sided low back pain, unspecified whether sciatica present     Problem List Patient Active Problem List   Diagnosis Date Noted   Lumbar radiculopathy 05/21/2020   Statin intolerance 01/16/2018   Bilateral leg edema 12/21/2017   History of colonic polyps 10/18/2017   Dyslipidemia 10/18/2017   Essential hypertension 03/26/2016   Family history of colon cancer 03/26/2016   Osteopenia 08/28/2015   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 [address]Phone: [phone]  Fax:  [phone] Name: Darlene KAASMRN: [id number] of Birth: [birth year]

## 2020-11-05 ENCOUNTER — Ambulatory Visit: Payer: Medicare PPO | Admitting: Physical Therapy

## 2020-11-05 ENCOUNTER — Encounter: Payer: Self-pay | Admitting: Physical Therapy

## 2020-11-05 ENCOUNTER — Other Ambulatory Visit: Payer: Self-pay

## 2020-11-05 DIAGNOSIS — R2689 Other abnormalities of gait and mobility: Secondary | ICD-10-CM

## 2020-11-05 DIAGNOSIS — M6281 Muscle weakness (generalized): Secondary | ICD-10-CM

## 2020-11-05 DIAGNOSIS — M545 Low back pain, unspecified: Secondary | ICD-10-CM | POA: Diagnosis not present

## 2020-11-05 DIAGNOSIS — R293 Abnormal posture: Secondary | ICD-10-CM

## 2020-11-05 DIAGNOSIS — M5441 Lumbago with sciatica, right side: Secondary | ICD-10-CM

## 2020-11-05 DIAGNOSIS — J3089 Other allergic rhinitis: Secondary | ICD-10-CM | POA: Diagnosis not present

## 2020-11-05 DIAGNOSIS — G8929 Other chronic pain: Secondary | ICD-10-CM | POA: Diagnosis not present

## 2020-11-05 DIAGNOSIS — J301 Allergic rhinitis due to pollen: Secondary | ICD-10-CM | POA: Diagnosis not present

## 2020-11-05 NOTE — Therapy (Signed)
[place], [place], [address] Phone: [phone]   Fax:  [phone]  Physical Therapy Treatment  Patient Details  Name: Darlene Mclaughlin MRN: [id number] Date of Birth: 1951/02/14 Referring Provider (PT): Vallarie Mare, MD   Encounter Date: 11/05/2020   PT End of Session - 11/05/20 1217     Visit Number 14    Number of Visits 20    Date for PT Re-Evaluation 11/19/20    Authorization Type [employer] MCR - foto    PT Start Time 2355    PT Stop Time 7322    PT Time Calculation (min) 43 min    Activity Tolerance Patient tolerated treatment well    Behavior During Therapy [facility] for tasks assessed/performed             Past Medical History:  Diagnosis Date   ALLERGIC RHINITIS 05/15/2008   Anemia    ASTHMA 05/15/2008   Asthma    Blood transfusion without reported diagnosis 1991   anemia   Breast cancer ([place]) 2016   right breast   Cancer ([place])    DCIS right breast   Diabetes mellitus without complication ([place])    GERD (gastroesophageal reflux disease)    Headache(784.0) 05/15/2008   HYPERTENSION 05/15/2008   IMPAIRED GLUCOSE TOLERANCE 05/15/2008   Obesity    OSTEOARTHRITIS 05/15/2008   back    Past Surgical History:  Procedure Laterality Date   ABDOMINAL HYSTERECTOMY     ANTERIOR LAT LUMBAR FUSION Left 05/21/2020   Procedure: LUMBAR TWO-THREE, LUMBAR THREE-FOUR DIRECT LATERAL LUMBAR INTERBODY FUSION;  Surgeon: Vallarie Mare, MD;  Location: [place];  Service: Neurosurgery;  Laterality: Left;   APPLICATION OF ROBOTIC ASSISTANCE FOR SPINAL PROCEDURE N/A 05/21/2020   Procedure: APPLICATION OF ROBOTIC ASSISTANCE FOR SPINAL PROCEDURE;  Surgeon: Vallarie Mare, MD;  Location: [place];  Service: Neurosurgery;  Laterality: N/A;   BREAST LUMPECTOMY Right 12/17/2014   BREAST LUMPECTOMY WITH RADIOACTIVE SEED LOCALIZATION Right 12/17/2014   Procedure: RIGHT BREAST RADIOACTIVE SEED GUIDED PARTIAL MASTECTOMY;   Surgeon: Erroll Luna, MD;  Location: [place];  Service: General;  Laterality: Right;   CESAREAN SECTION     KNEE SURGERY     arthroscopic left   TRANSFORAMINAL LUMBAR INTERBODY FUSION W/ MIS 2 LEVEL Right 05/21/2020   Procedure: LUMBAR FOUR-FIVE, LUMBAR FIVE-SACRAL ONE MINIMALLY INVASIVE TRANSFORAMINAL LUMBAR INTERBODY FUSION WITH INSTRUMENTATION LUMBAR TWO-SACRAL ONE;  Surgeon: Vallarie Mare, MD;  Location: [place];  Service: Neurosurgery;  Laterality: Right;    There were no vitals filed for this visit.   Subjective Assessment - 11/05/20 1225     Subjective Pt reports that she was very busy since last visit.  She went shopping and feels like maybe she "overdid it".  Her pain is ~5/10 R sided back pain.  Aggs: standing Eases: rest              [facility] Adult PT Treatment/Exercise:   Therapeutic Exercise to improve core and LE strength to reduce pain and improve functional mobility.  Pt cued for form and pacing throughout: Nu-step L7, 48mwhile taking subjective Lateral walk RTB x 3 laps in // Step up 2x10 ea 6'' box with contralateral UE Mini squats - 2x10 Resisted cable walk out - backwards - 20# - 10x (CGA) Ambulation with DF assist brace - 185' (cane in L hand)   The following therex was not completed today:   -DF in sitting with russian stim  to tib anterior,  10/10 cycle up to 55 ma, 10 min  Heel Toe raises x20 5 hurdle laps in // - step to - alternating lead Sit to stand -  from mat + airex min UE support - 2x10 heel raises - 2x10 on foam Standing march - on foam - 3x10  LAQ - 10# - R 3x10 Abdominal contraction + hip adduction in supine with ball - 5'' hold 10x Abdominal contraction + alternating clamshell with GTB - 10x Alternating supine march - GTB - 10x  Therapeutic Activity - adjusting DF assist brace     PT Short Term Goals - 10/07/20 1254       PT SHORT TERM GOAL #1   Title [name] will be >75% HEP compliant within 3 weeks  to improve carryover between sessions and facilitate independent management of condition.    Status Achieved    Target Date 10/09/20               PT Long Term Goals - 10/23/20 1052       PT LONG TERM GOAL #1   Title [name] will improve FOTO score from 48 (on eval) to 52 as a proxy for functional improvement    Baseline 9/1: 43.2    Status Partially Met    Target Date 11/19/20      PT LONG TERM GOAL #2   Title [name] will be able to return to water aerobics, not limited by pain  EVAL: not able    Baseline 9/1:  "I feel like I'm getting there"    Status On-going    Target Date 11/19/20      PT LONG TERM GOAL #3   Title [name] will improve max gait speed to .9 m/s (.1 m/s MCID) to show functional improvement in ambulation  EVAL: .625 m/s    Baseline 9/1: .77 m/s    Status On-going    Target Date 11/19/20      PT LONG TERM GOAL #4   Title [name] will improve 30'' STS (MCID 2) to >/= 10x to show improved LE strength and improved transfers  EVAL: 7x    Baseline 9/1: MET 10x    Status Achieved    Target Date 11/19/20      PT LONG TERM GOAL #5   Title [name] will be able to stand for >30'' in semi tandem stance, to show a significant improvement in balance in order to reduce fall risk  EVAL: <10'' in semi-tandem stance bil    Baseline 9/1: MET bil    Status Achieved    Target Date 11/19/20                   Plan - 11/05/20 1251     Clinical Impression Statement Pt reports no increase in baseline pain following therapy  HEP was reviewed, but left unchanged    Overall, MARRISSA DAI is progressing well with therapy.  Today we concentrated on core strengthening, lower extremity strengthening, and functional strengthening.  Pt shows increased endurance with standing exercises today.  She is progressing as expected with increased activity tolerance and improved ability to complete ADLs such as shopping.  Pt  will continue to benefit from skilled physical therapy to address remaining deficits and achieve listed goals.  Continue per POC.    PT Treatment/Interventions ADLs/Self Care Home Management;Aquatic Therapy;Therapeutic exercise;Therapeutic activities;Neuromuscular re-education;Manual techniques;Iontophoresis 107m/ml Dexamethasone;Dry needling;Gait training  PT Next Visit Plan Russian R LE; balance progression, core strength progression, LE strength progression    PT Home Exercise Plan [facility]             Patient will benefit from skilled therapeutic intervention in order to improve the following deficits and impairments:  Decreased endurance, Abnormal gait, Decreased activity tolerance, Decreased strength, Pain, Decreased balance  Visit Diagnosis: Muscle weakness (generalized)  Other abnormalities of gait and mobility  Chronic right-sided low back pain, unspecified whether sciatica present  Acute bilateral low back pain with right-sided sciatica  Abnormal posture     Problem List Patient Active Problem List   Diagnosis Date Noted   Lumbar radiculopathy 05/21/2020   Statin intolerance 01/16/2018   Bilateral leg edema 12/21/2017   History of colonic polyps 10/18/2017   Dyslipidemia 10/18/2017   Essential hypertension 03/26/2016   Family history of colon cancer 03/26/2016   Osteopenia 08/28/2015   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) 12/19/2012   Diabetes mellitus with coincident hypertension ([place]) 05/15/2008   Allergic rhinitis 05/15/2008   Asthma, mild intermittent 05/15/2008   Osteoarthritis 05/15/2008   HEADACHE 05/15/2008    Mathis Dad, PT 11/05/2020, 1:08 PM  [facility] [address] [place], [place], [address] Phone: [phone]   Fax:  [phone]  Name: LILYA SMITHERMAN MRN: [id number] Date of Birth:  07/07/1950

## 2020-11-11 ENCOUNTER — Ambulatory Visit: Payer: Medicare PPO

## 2020-11-11 ENCOUNTER — Other Ambulatory Visit: Payer: Self-pay

## 2020-11-11 DIAGNOSIS — M6281 Muscle weakness (generalized): Secondary | ICD-10-CM | POA: Diagnosis not present

## 2020-11-11 DIAGNOSIS — R293 Abnormal posture: Secondary | ICD-10-CM | POA: Diagnosis not present

## 2020-11-11 DIAGNOSIS — M545 Low back pain, unspecified: Secondary | ICD-10-CM | POA: Diagnosis not present

## 2020-11-11 DIAGNOSIS — R2689 Other abnormalities of gait and mobility: Secondary | ICD-10-CM

## 2020-11-11 DIAGNOSIS — M5441 Lumbago with sciatica, right side: Secondary | ICD-10-CM

## 2020-11-11 DIAGNOSIS — G8929 Other chronic pain: Secondary | ICD-10-CM | POA: Diagnosis not present

## 2020-11-11 NOTE — Therapy (Signed)
[place], [place], [address] Phone: [phone]   Fax:  [phone]  Physical Therapy Treatment  Patient Details  Name: Darlene Mclaughlin MRN: [id number] Date of Birth: 11/05/1950 Referring Provider (PT): Vallarie Mare, MD   Encounter Date: 11/11/2020   PT End of Session - 11/11/20 1005     Visit Number 15    Number of Visits 20    Date for PT Re-Evaluation 11/19/20    Authorization Type [employer] MCR - foto    PT Start Time 1000    PT Stop Time 3299    PT Time Calculation (min) 42 min    Activity Tolerance Patient tolerated treatment well    Behavior During Therapy [facility] for tasks assessed/performed             Past Medical History:  Diagnosis Date   ALLERGIC RHINITIS 05/15/2008   Anemia    ASTHMA 05/15/2008   Asthma    Blood transfusion without reported diagnosis 1991   anemia   Breast cancer ([place]) 2016   right breast   Cancer ([place])    DCIS right breast   Diabetes mellitus without complication ([place])    GERD (gastroesophageal reflux disease)    Headache(784.0) 05/15/2008   HYPERTENSION 05/15/2008   IMPAIRED GLUCOSE TOLERANCE 05/15/2008   Obesity    OSTEOARTHRITIS 05/15/2008   back    Past Surgical History:  Procedure Laterality Date   ABDOMINAL HYSTERECTOMY     ANTERIOR LAT LUMBAR FUSION Left 05/21/2020   Procedure: LUMBAR TWO-THREE, LUMBAR THREE-FOUR DIRECT LATERAL LUMBAR INTERBODY FUSION;  Surgeon: Vallarie Mare, MD;  Location: [place];  Service: Neurosurgery;  Laterality: Left;   APPLICATION OF ROBOTIC ASSISTANCE FOR SPINAL PROCEDURE N/A 05/21/2020   Procedure: APPLICATION OF ROBOTIC ASSISTANCE FOR SPINAL PROCEDURE;  Surgeon: Vallarie Mare, MD;  Location: [place];  Service: Neurosurgery;  Laterality: N/A;   BREAST LUMPECTOMY Right 12/17/2014   BREAST LUMPECTOMY WITH RADIOACTIVE SEED LOCALIZATION Right 12/17/2014   Procedure: RIGHT BREAST RADIOACTIVE SEED GUIDED PARTIAL MASTECTOMY;   Surgeon: Erroll Luna, MD;  Location: [place];  Service: General;  Laterality: Right;   CESAREAN SECTION     KNEE SURGERY     arthroscopic left   TRANSFORAMINAL LUMBAR INTERBODY FUSION W/ MIS 2 LEVEL Right 05/21/2020   Procedure: LUMBAR FOUR-FIVE, LUMBAR FIVE-SACRAL ONE MINIMALLY INVASIVE TRANSFORAMINAL LUMBAR INTERBODY FUSION WITH INSTRUMENTATION LUMBAR TWO-SACRAL ONE;  Surgeon: Vallarie Mare, MD;  Location: [place];  Service: Neurosurgery;  Laterality: Right;    There were no vitals filed for this visit.   Subjective Assessment - 11/11/20 1005     Subjective Pt presents to PT with increased R LE and back pain. She has been compliant with HEP with no adverse effect. Ready to begin PT at this time.    Currently in Pain? Yes    Pain Score 5     Pain Location Back    Pain Orientation Lower           [facility] Adult PT Treatment/Exercise:   Therapeutic Exercise to improve core and LE strength to reduce pain and improve functional mobility.  Pt cued for form and pacing throughout: Nu-step L7, 8mwhile taking subjective Step up 2x10 ea 6'' box with contralateral UE Mini squats - 2x10 Resisted cable walk out - fwd - 17# - 10x (CGA) High march in // x 3 laps 2lbs Lateral walk x 3 laps in // 2lbs Standing hip abd/ext 2x10  2lbs Wobble board fwd/bwd 2x20   The following therex was not completed today:   DF in sitting with russian stim to tib anterior,  10/10 cycle up to 55 ma, 10 min  Heel Toe raises x20 5 hurdle laps in // - step to - alternating lead Sit to stand -  from mat + airex min UE support - 2x10 heel raises - 2x10 on foam Standing march - on foam - 3x10  LAQ - 10# - R 3x10 Abdominal contraction + hip adduction in supine with ball - 5'' hold 10x Abdominal contraction + alternating clamshell with GTB - 10x Alternating supine march - GTB - 10x                                PT Short Term Goals - 10/07/20 1254       PT  SHORT TERM GOAL #1   Title Rogue H Asante will be >75% HEP compliant within 3 weeks to improve carryover between sessions and facilitate independent management of condition.    Status Achieved    Target Date 10/09/20               PT Long Term Goals - 10/23/20 1052       PT LONG TERM GOAL #1   Title Nielle H Burson will improve FOTO score from 48 (on eval) to 52 as a proxy for functional improvement    Baseline 9/1: 43.2    Status Partially Met    Target Date 11/19/20      PT LONG TERM GOAL #2   Title Glenda H Wyke will be able to return to water aerobics, not limited by pain  EVAL: not able    Baseline 9/1:  "I feel like I'm getting there"    Status On-going    Target Date 11/19/20      PT LONG TERM GOAL #3   Title Aleshia H Gatliff will improve max gait speed to .9 m/s (.1 m/s MCID) to show functional improvement in ambulation  EVAL: .625 m/s    Baseline 9/1: .77 m/s    Status On-going    Target Date 11/19/20      PT LONG TERM GOAL #4   Title Rachelann H Villafranca will improve 30'' STS (MCID 2) to >/= 10x to show improved LE strength and improved transfers  EVAL: 7x    Baseline 9/1: MET 10x    Status Achieved    Target Date 11/19/20      PT LONG TERM GOAL #5   Title Brylin H Krauss will be able to stand for >30'' in semi tandem stance, to show a significant improvement in balance in order to reduce fall risk  EVAL: <10'' in semi-tandem stance bil    Baseline 9/1: MET bil    Status Achieved    Target Date 11/19/20                   Plan - 11/11/20 1006     Clinical Impression Statement Pt was able to complete all prescribed exercises with no adverse effect or increase in baseline pain. Today's session focused on continued LE strengthening with focus on improving balance and stability. Pt continues to progress with PT, showing improving endurance and functional mobility. Will continue to progress exercises as able per POC.    PT  Treatment/Interventions ADLs/Self Care Home Management;Aquatic Therapy;Therapeutic exercise;Therapeutic activities;Neuromuscular re-education;Manual techniques;Iontophoresis 4mg/ml Dexamethasone;Dry needling;Gait training      PT Next Visit Plan Russian R LE; balance progression, core strength progression, LE strength progression    PT Home Exercise Plan [employer]             Patient will benefit from skilled therapeutic intervention in order to improve the following deficits and impairments:  Decreased endurance, Abnormal gait, Decreased activity tolerance, Decreased strength, Pain, Decreased balance  Visit Diagnosis: Muscle weakness (generalized)  Other abnormalities of gait and mobility  Chronic right-sided low back pain, unspecified whether sciatica present  Acute bilateral low back pain with right-sided sciatica  Abnormal posture     Problem List Patient Active Problem List   Diagnosis Date Noted   Lumbar radiculopathy 05/21/2020   Statin intolerance 01/16/2018   Bilateral leg edema 12/21/2017   History of colonic polyps 10/18/2017   Dyslipidemia 10/18/2017   Essential hypertension 03/26/2016   Family history of colon cancer 03/26/2016   Osteopenia 08/28/2015   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 [place], [place], [address] Phone: [phone]   Fax:  [phone]  Name: CYNCERE RUHE MRN: [id number] Date of Birth: 1950-10-27

## 2020-11-13 ENCOUNTER — Ambulatory Visit: Payer: Medicare PPO | Admitting: Physical Therapy

## 2020-11-13 ENCOUNTER — Other Ambulatory Visit: Payer: Self-pay

## 2020-11-13 ENCOUNTER — Encounter: Payer: Self-pay | Admitting: Physical Therapy

## 2020-11-13 DIAGNOSIS — R2689 Other abnormalities of gait and mobility: Secondary | ICD-10-CM

## 2020-11-13 DIAGNOSIS — M545 Low back pain, unspecified: Secondary | ICD-10-CM | POA: Diagnosis not present

## 2020-11-13 DIAGNOSIS — J301 Allergic rhinitis due to pollen: Secondary | ICD-10-CM | POA: Diagnosis not present

## 2020-11-13 DIAGNOSIS — G8929 Other chronic pain: Secondary | ICD-10-CM | POA: Diagnosis not present

## 2020-11-13 DIAGNOSIS — R293 Abnormal posture: Secondary | ICD-10-CM

## 2020-11-13 DIAGNOSIS — M6281 Muscle weakness (generalized): Secondary | ICD-10-CM

## 2020-11-13 DIAGNOSIS — J3089 Other allergic rhinitis: Secondary | ICD-10-CM | POA: Diagnosis not present

## 2020-11-13 DIAGNOSIS — M5441 Lumbago with sciatica, right side: Secondary | ICD-10-CM | POA: Diagnosis not present

## 2020-11-13 NOTE — Therapy (Signed)
[place], [place], [address] Phone: [phone]   Fax:  [phone]  Physical Therapy Treatment  Patient Details  Name: Darlene Mclaughlin MRN: [id number] Date of Birth: 08-25-1950 Referring Provider (PT): Vallarie Mare, MD   Encounter Date: 11/13/2020   PT End of Session - 11/13/20 1132     Visit Number 16    Number of Visits 20    Date for PT Re-Evaluation 11/19/20    Authorization Type [employer] MCR - foto    PT Start Time 0017    PT Stop Time 4944    PT Time Calculation (min) 44 min    Activity Tolerance Patient tolerated treatment well    Behavior During Therapy [facility] for tasks assessed/performed             Past Medical History:  Diagnosis Date   ALLERGIC RHINITIS 05/15/2008   Anemia    ASTHMA 05/15/2008   Asthma    Blood transfusion without reported diagnosis 1991   anemia   Breast cancer ([place]) 2016   right breast   Cancer ([place])    DCIS right breast   Diabetes mellitus without complication ([place])    GERD (gastroesophageal reflux disease)    Headache(784.0) 05/15/2008   HYPERTENSION 05/15/2008   IMPAIRED GLUCOSE TOLERANCE 05/15/2008   Obesity    OSTEOARTHRITIS 05/15/2008   back    Past Surgical History:  Procedure Laterality Date   ABDOMINAL HYSTERECTOMY     ANTERIOR LAT LUMBAR FUSION Left 05/21/2020   Procedure: LUMBAR TWO-THREE, LUMBAR THREE-FOUR DIRECT LATERAL LUMBAR INTERBODY FUSION;  Surgeon: Vallarie Mare, MD;  Location: [place];  Service: Neurosurgery;  Laterality: Left;   APPLICATION OF ROBOTIC ASSISTANCE FOR SPINAL PROCEDURE N/A 05/21/2020   Procedure: APPLICATION OF ROBOTIC ASSISTANCE FOR SPINAL PROCEDURE;  Surgeon: Vallarie Mare, MD;  Location: [place];  Service: Neurosurgery;  Laterality: N/A;   BREAST LUMPECTOMY Right 12/17/2014   BREAST LUMPECTOMY WITH RADIOACTIVE SEED LOCALIZATION Right 12/17/2014   Procedure: RIGHT BREAST RADIOACTIVE SEED GUIDED PARTIAL MASTECTOMY;   Surgeon: Erroll Luna, MD;  Location: [place];  Service: General;  Laterality: Right;   CESAREAN SECTION     KNEE SURGERY     arthroscopic left   TRANSFORAMINAL LUMBAR INTERBODY FUSION W/ MIS 2 LEVEL Right 05/21/2020   Procedure: LUMBAR FOUR-FIVE, LUMBAR FIVE-SACRAL ONE MINIMALLY INVASIVE TRANSFORAMINAL LUMBAR INTERBODY FUSION WITH INSTRUMENTATION LUMBAR TWO-SACRAL ONE;  Surgeon: Vallarie Mare, MD;  Location: [place];  Service: Neurosurgery;  Laterality: Right;    There were no vitals filed for this visit.   [facility] Adult PT Treatment/Exercise:   Therapeutic Exercise to improve core and LE strength to reduce pain and improve functional mobility.  Pt cued for form and pacing throughout: Nu-step L7, 81mwhile taking subjective Step up 2x10 ea 6'' box with contralateral UE Mini squats - 2x10 High march in // x 3 laps 2.5 lbs Lateral walk x 3 laps in // 2.5 lbs Standing hip abd/ext 2x10 2.5 lbs Wobble board fwd/bwd 2x20   The following therex was not completed today:   DF in sitting with russian stim to tib anterior,  10/10 cycle up to 55 ma, 10 min  Heel Toe raises x20 Resisted cable walk out - fwd - 17# - 10x (CGA) 5 hurdle laps in // - step to - alternating lead Sit to stand -  from mat + airex min UE support - 2x10 heel raises - 2x10 on foam Standing  march - on foam - 3x10  LAQ - 10# - R 3x10 Abdominal contraction + hip adduction in supine with ball - 5'' hold 10x Abdominal contraction + alternating clamshell with GTB - 10x Alternating supine march - GTB - 10x     PT Short Term Goals - 10/07/20 1254       PT SHORT TERM GOAL #1   Title [name] will be >75% HEP compliant within 3 weeks to improve carryover between sessions and facilitate independent management of condition.    Status Achieved    Target Date 10/09/20               PT Long Term Goals - 10/23/20 1052       PT LONG TERM GOAL #1   Title [name] will improve FOTO  score from 48 (on eval) to 52 as a proxy for functional improvement    Baseline 9/1: 43.2    Status Partially Met    Target Date 11/19/20      PT LONG TERM GOAL #2   Title [name] will be able to return to water aerobics, not limited by pain  EVAL: not able    Baseline 9/1:  "I feel like I'm getting there"    Status On-going    Target Date 11/19/20      PT LONG TERM GOAL #3   Title [name] will improve max gait speed to .9 m/s (.1 m/s MCID) to show functional improvement in ambulation  EVAL: .625 m/s    Baseline 9/1: .77 m/s    Status On-going    Target Date 11/19/20      PT LONG TERM GOAL #4   Title [name] will improve 30'' STS (MCID 2) to >/= 10x to show improved LE strength and improved transfers  EVAL: 7x    Baseline 9/1: MET 10x    Status Achieved    Target Date 11/19/20      PT LONG TERM GOAL #5   Title [name] will be able to stand for >30'' in semi tandem stance, to show a significant improvement in balance in order to reduce fall risk  EVAL: <10'' in semi-tandem stance bil    Baseline 9/1: MET bil    Status Achieved    Target Date 11/19/20                   Plan - 11/13/20 1200     Clinical Impression Statement Pt reports no increase in baseline pain following therapy  HEP was reviewed, but left unchanged    Overall, ALYSIAH SUPPA is progressing well with therapy.  Today we concentrated on lower extremity strengthening, quad strengthening, and hip strengthening.  Pt needs max cuing for squat form, but is able to improve significantly with cuing and practice (tends to hip hinge and excessively flex when not cued).  Pt will continue to benefit from skilled physical therapy to address remaining deficits and achieve listed goals.  Continue per POC.    PT Treatment/Interventions ADLs/Self Care Home Management;Aquatic Therapy;Therapeutic exercise;Therapeutic activities;Neuromuscular re-education;Manual  techniques;Iontophoresis 74m/ml Dexamethasone;Dry needling;Gait training    PT Next Visit Plan Russian R LE; balance progression, core strength progression, LE strength progression    [facility]            Patient will benefit from skilled therapeutic intervention in order to improve the following deficits and impairments:  Decreased endurance, Abnormal gait,  Decreased activity tolerance, Decreased strength, Pain, Decreased balance  Visit Diagnosis: Muscle weakness (generalized)  Other abnormalities of gait and mobility  Chronic right-sided low back pain, unspecified whether sciatica present  Abnormal posture     Problem List Patient Active Problem List   Diagnosis Date Noted   Lumbar radiculopathy 05/21/2020   Statin intolerance 01/16/2018   Bilateral leg edema 12/21/2017   History of colonic polyps 10/18/2017   Dyslipidemia 10/18/2017   Essential hypertension 03/26/2016   Family history of colon cancer 03/26/2016   Osteopenia 08/28/2015   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 [place], [place], [address] Phone: [phone]   Fax:  [phone]  Name: MAXINE FREDMAN MRN: [id number] Date of Birth: January 17, 1951

## 2020-11-17 ENCOUNTER — Ambulatory Visit: Payer: Medicare PPO

## 2020-11-17 ENCOUNTER — Other Ambulatory Visit: Payer: Self-pay

## 2020-11-17 DIAGNOSIS — G8929 Other chronic pain: Secondary | ICD-10-CM

## 2020-11-17 DIAGNOSIS — M6281 Muscle weakness (generalized): Secondary | ICD-10-CM | POA: Diagnosis not present

## 2020-11-17 DIAGNOSIS — R293 Abnormal posture: Secondary | ICD-10-CM

## 2020-11-17 DIAGNOSIS — R2689 Other abnormalities of gait and mobility: Secondary | ICD-10-CM | POA: Diagnosis not present

## 2020-11-17 DIAGNOSIS — M545 Low back pain, unspecified: Secondary | ICD-10-CM | POA: Diagnosis not present

## 2020-11-17 DIAGNOSIS — M5441 Lumbago with sciatica, right side: Secondary | ICD-10-CM | POA: Diagnosis not present

## 2020-11-17 NOTE — Therapy (Signed)
[place], [place], [address] Phone: [phone]   Fax:  [phone]  Physical Therapy Treatment  Patient Details  Name: Darlene Mclaughlin MRN: [id number] Date of Birth: 03/28/1950 Referring Provider (PT): Vallarie Mare, MD   Encounter Date: 11/17/2020   PT End of Session - 11/17/20 1046     Visit Number 17    Number of Visits 20    Date for PT Re-Evaluation 11/19/20    Authorization Type [employer] MCR - foto    PT Start Time 5397    PT Stop Time 1125    PT Time Calculation (min) 39 min    Activity Tolerance Patient tolerated treatment well    Behavior During Therapy [facility] for tasks assessed/performed             Past Medical History:  Diagnosis Date   ALLERGIC RHINITIS 05/15/2008   Anemia    ASTHMA 05/15/2008   Asthma    Blood transfusion without reported diagnosis 1991   anemia   Breast cancer ([place]) 2016   right breast   Cancer ([place])    DCIS right breast   Diabetes mellitus without complication ([place])    GERD (gastroesophageal reflux disease)    Headache(784.0) 05/15/2008   HYPERTENSION 05/15/2008   IMPAIRED GLUCOSE TOLERANCE 05/15/2008   Obesity    OSTEOARTHRITIS 05/15/2008   back    Past Surgical History:  Procedure Laterality Date   ABDOMINAL HYSTERECTOMY     ANTERIOR LAT LUMBAR FUSION Left 05/21/2020   Procedure: LUMBAR TWO-THREE, LUMBAR THREE-FOUR DIRECT LATERAL LUMBAR INTERBODY FUSION;  Surgeon: Vallarie Mare, MD;  Location: [place];  Service: Neurosurgery;  Laterality: Left;   APPLICATION OF ROBOTIC ASSISTANCE FOR SPINAL PROCEDURE N/A 05/21/2020   Procedure: APPLICATION OF ROBOTIC ASSISTANCE FOR SPINAL PROCEDURE;  Surgeon: Vallarie Mare, MD;  Location: [place];  Service: Neurosurgery;  Laterality: N/A;   BREAST LUMPECTOMY Right 12/17/2014   BREAST LUMPECTOMY WITH RADIOACTIVE SEED LOCALIZATION Right 12/17/2014   Procedure: RIGHT BREAST RADIOACTIVE SEED GUIDED PARTIAL MASTECTOMY;   Surgeon: Erroll Luna, MD;  Location: [place];  Service: General;  Laterality: Right;   CESAREAN SECTION     KNEE SURGERY     arthroscopic left   TRANSFORAMINAL LUMBAR INTERBODY FUSION W/ MIS 2 LEVEL Right 05/21/2020   Procedure: LUMBAR FOUR-FIVE, LUMBAR FIVE-SACRAL ONE MINIMALLY INVASIVE TRANSFORAMINAL LUMBAR INTERBODY FUSION WITH INSTRUMENTATION LUMBAR TWO-SACRAL ONE;  Surgeon: Vallarie Mare, MD;  Location: [place];  Service: Neurosurgery;  Laterality: Right;    There were no vitals filed for this visit.   Subjective Assessment - 11/17/20 1047     Subjective Pt presents to PT with no current reports of lower back pain or discomfort. She has been compliant with HEP with no adverse effect. Pt has signed up for aquatic exercise class post discharge from PT. Ready to begin PT at this time.    Currently in Pain? No/denies    Pain Score 0-No pain           [facility] Adult PT Treatment/Exercise:   Therapeutic Exercise to improve core and LE strength to reduce pain and improve functional mobility.  Pt cued for form and pacing throughout: Nu-step L7, 64mwhile taking subjective Step up x10 ea 8'' box with contralateral UE Mini squats - 2x10 STS 2x10 - no UE support High march in // x 3 laps 3 lbs Lateral walk x 3 laps in // 3lbs Standing hip abd/ext 2x10 3lbs  Wobble board fwd/bwd 2x20   The following therex was not completed today:   DF in sitting with russian stim to tib anterior,  10/10 cycle up to 55 ma, 10 min  Heel Toe raises x20 Resisted cable walk out - fwd - 17# - 10x (CGA) 5 hurdle laps in // - step to - alternating lead heel raises - 2x10 on foam Standing march - on foam - 3x10  LAQ - 10# - R 3x10 Abdominal contraction + hip adduction in supine with ball - 5'' hold 10x Abdominal contraction + alternating clamshell with GTB - 10x Alternating supine march - GTB - 10x                               PT Short Term Goals - 10/07/20  1254       PT SHORT TERM GOAL #1   Title [name] will be >75% HEP compliant within 3 weeks to improve carryover between sessions and facilitate independent management of condition.    Status Achieved    Target Date 10/09/20               PT Long Term Goals - 10/23/20 1052       PT LONG TERM GOAL #1   Title [name] will improve FOTO score from 48 (on eval) to 52 as a proxy for functional improvement    Baseline 9/1: 43.2    Status Partially Met    Target Date 11/19/20      PT LONG TERM GOAL #2   Title SHENIQUE CHILDERS will be able to return to water aerobics, not limited by pain  EVAL: not able    Baseline 9/1:  "I feel like I'm getting there"    Status On-going    Target Date 11/19/20      PT LONG TERM GOAL #3   Title [name] will improve max gait speed to .9 m/s (.1 m/s MCID) to show functional improvement in ambulation  EVAL: .625 m/s    Baseline 9/1: .77 m/s    Status On-going    Target Date 11/19/20      PT LONG TERM GOAL #4   Title [name] will improve 30'' STS (MCID 2) to >/= 10x to show improved LE strength and improved transfers  EVAL: 7x    Baseline 9/1: MET 10x    Status Achieved    Target Date 11/19/20      PT LONG TERM GOAL #5   Title MIKAILAH MOREL will be able to stand for >30'' in semi tandem stance, to show a significant improvement in balance in order to reduce fall risk  EVAL: <10'' in semi-tandem stance bil    Baseline 9/1: MET bil    Status Achieved    Target Date 11/19/20                   Plan - 11/17/20 1052     Clinical Impression Statement Pt was able to complete all prescribed exercises with no adverse effect. Continues to progress well with therapy, with today's session again focusing on strengthening core, proximal hip muscles and improving balance. Pt is likely near good point for PT discharge with HEP in place and start of aqutic exercise classes. PT will assess LTGs at next  session and create discharge plan with pt.    PT Treatment/Interventions ADLs/Self Care Home Management;Aquatic Therapy;Therapeutic exercise;Therapeutic  activities;Neuromuscular re-education;Manual techniques;Iontophoresis 68m/ml Dexamethasone;Dry needling;Gait training    PT Next Visit Plan review LTGs and HEP for discharge    [facility]   Consulted and Agree with Plan of Care Patient             Patient will benefit from skilled therapeutic intervention in order to improve the following deficits and impairments:  Decreased endurance, Abnormal gait, Decreased activity tolerance, Decreased strength, Pain, Decreased balance  Visit Diagnosis: Muscle weakness (generalized)  Other abnormalities of gait and mobility  Chronic right-sided low back pain, unspecified whether sciatica present  Abnormal posture  Acute bilateral low back pain with right-sided sciatica     Problem List Patient Active Problem List   Diagnosis Date Noted   Lumbar radiculopathy 05/21/2020   Statin intolerance 01/16/2018   Bilateral leg edema 12/21/2017   History of colonic polyps 10/18/2017   Dyslipidemia 10/18/2017   Essential hypertension 03/26/2016   Family history of colon cancer 03/26/2016   Osteopenia 08/28/2015   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 [address]Phone: [phone]  Fax:  [phone] Name: IROSEMARY MOSSBARGERMRN: [id number] of Birth: 820-Mar-1952

## 2020-11-18 ENCOUNTER — Other Ambulatory Visit: Payer: Self-pay | Admitting: Hematology

## 2020-11-18 DIAGNOSIS — Z1231 Encounter for screening mammogram for malignant neoplasm of breast: Secondary | ICD-10-CM

## 2020-11-19 ENCOUNTER — Encounter: Payer: Self-pay | Admitting: Physical Therapy

## 2020-11-19 ENCOUNTER — Other Ambulatory Visit: Payer: Self-pay

## 2020-11-19 ENCOUNTER — Ambulatory Visit: Payer: Medicare PPO | Admitting: Physical Therapy

## 2020-11-19 DIAGNOSIS — R293 Abnormal posture: Secondary | ICD-10-CM

## 2020-11-19 DIAGNOSIS — G8929 Other chronic pain: Secondary | ICD-10-CM

## 2020-11-19 DIAGNOSIS — M545 Low back pain, unspecified: Secondary | ICD-10-CM | POA: Diagnosis not present

## 2020-11-19 DIAGNOSIS — M6281 Muscle weakness (generalized): Secondary | ICD-10-CM | POA: Diagnosis not present

## 2020-11-19 DIAGNOSIS — R2689 Other abnormalities of gait and mobility: Secondary | ICD-10-CM

## 2020-11-19 DIAGNOSIS — J301 Allergic rhinitis due to pollen: Secondary | ICD-10-CM | POA: Diagnosis not present

## 2020-11-19 DIAGNOSIS — M5441 Lumbago with sciatica, right side: Secondary | ICD-10-CM | POA: Diagnosis not present

## 2020-11-19 DIAGNOSIS — J3089 Other allergic rhinitis: Secondary | ICD-10-CM | POA: Diagnosis not present

## 2020-11-19 NOTE — Therapy (Signed)
[place], [place], [address] Phone: [phone]   Fax:  [phone]  PHYSICAL THERAPY DISCHARGE SUMMARY  Visits from Start of Care: 18  Current functional level related to goals / functional outcomes: See assessment/goals   Remaining deficits: See assessment/goals   Education / Equipment: HEP and D/C plans  Patient agrees to discharge. Patient goals were partially met. Patient is being discharged due to being pleased with the current functional level.   Physical Therapy Treatment  Patient Details  Name: Darlene Mclaughlin MRN: [id number] Date of Birth: 02-09-1951 Referring Provider (PT): Vallarie Mare, MD   Encounter Date: 11/19/2020   PT End of Session - 11/19/20 1045     Visit Number 18    Number of Visits 20    Date for PT Re-Evaluation 11/19/20    Authorization Type [employer] MCR - foto    PT Start Time 3474    PT Stop Time 2595    PT Time Calculation (min) 30 min    Activity Tolerance Patient tolerated treatment well    Behavior During Therapy [facility] for tasks assessed/performed             Past Medical History:  Diagnosis Date   ALLERGIC RHINITIS 05/15/2008   Anemia    ASTHMA 05/15/2008   Asthma    Blood transfusion without reported diagnosis 1991   anemia   Breast cancer ([place]) 2016   right breast   Cancer ([place])    DCIS right breast   Diabetes mellitus without complication ([place])    GERD (gastroesophageal reflux disease)    Headache(784.0) 05/15/2008   HYPERTENSION 05/15/2008   IMPAIRED GLUCOSE TOLERANCE 05/15/2008   Obesity    OSTEOARTHRITIS 05/15/2008   back    Past Surgical History:  Procedure Laterality Date   ABDOMINAL HYSTERECTOMY     ANTERIOR LAT LUMBAR FUSION Left 05/21/2020   Procedure: LUMBAR TWO-THREE, LUMBAR THREE-FOUR DIRECT LATERAL LUMBAR INTERBODY FUSION;  Surgeon: Vallarie Mare, MD;  Location: [place];  Service: Neurosurgery;  Laterality: Left;    APPLICATION OF ROBOTIC ASSISTANCE FOR SPINAL PROCEDURE N/A 05/21/2020   Procedure: APPLICATION OF ROBOTIC ASSISTANCE FOR SPINAL PROCEDURE;  Surgeon: Vallarie Mare, MD;  Location: [place];  Service: Neurosurgery;  Laterality: N/A;   BREAST LUMPECTOMY Right 12/17/2014   BREAST LUMPECTOMY WITH RADIOACTIVE SEED LOCALIZATION Right 12/17/2014   Procedure: RIGHT BREAST RADIOACTIVE SEED GUIDED PARTIAL MASTECTOMY;  Surgeon: Erroll Luna, MD;  Location: [place];  Service: General;  Laterality: Right;   CESAREAN SECTION     KNEE SURGERY     arthroscopic left   TRANSFORAMINAL LUMBAR INTERBODY FUSION W/ MIS 2 LEVEL Right 05/21/2020   Procedure: LUMBAR FOUR-FIVE, LUMBAR FIVE-SACRAL ONE MINIMALLY INVASIVE TRANSFORAMINAL LUMBAR INTERBODY FUSION WITH INSTRUMENTATION LUMBAR TWO-SACRAL ONE;  Surgeon: Vallarie Mare, MD;  Location: [place];  Service: Neurosurgery;  Laterality: Right;    There were no vitals filed for this visit.   Subjective Assessment - 11/19/20 1051     Subjective Pt reports that she is ready for D/C.  She is signed up for aquatic therapy and ready to transition to this after leaving therapy.  She feels confident with her HEP.  0/10 pain today.    Pertinent History lumbar fusion late March 2022, DM TII, hx of breast cancer                [facility] PT Assessment - 11/19/20 0001       Observation/Other  Assessments   Focus on Therapeutic Outcomes (FOTO)  50      Other:   Other/ Comments 10 m max gait speed: 13'', .78 m/s, AD: cane              [facility] Adult PT Treatment/Exercise:   Therapeutic Exercise to improve core and LE strength to reduce pain and improve functional mobility.  Pt cued for form and pacing throughout: Nu-step L7, 24mwhile taking subjective  Therapeutic Activity - collecting information for goals, checking progress, and reviewing with patient    PT Short Term Goals - 10/07/20 1254       PT SHORT TERM GOAL #1   Title IEilleen Kempfwill be >75% HEP compliant within 3 weeks to improve carryover between sessions and facilitate independent management of condition.    Status Achieved    Target Date 10/09/20               PT Long Term Goals - 11/19/20 1051       PT LONG TERM GOAL #1   Title IEilleen Kempfwill improve FOTO score from 48 (on eval) to 52 as a proxy for functional improvement    Baseline 9/1: 43.2 9/28: 50    Status Partially Met      PT LONG TERM GOAL #2   Title Darlene GRAYBEALwill be able to return to water aerobics, not limited by pain  EVAL: not able    Baseline 9/1:  "I feel like I'm getting there" 9/28: MET    Status Achieved      PT LONG TERM GOAL #3   Title IEilleen Kempfwill improve max gait speed to .9 m/s (.1 m/s MCID) to show functional improvement in ambulation  EVAL: .625 m/s    Baseline 9/1: .77 m/s 9/28: .77    Status Partially Met      PT LONG TERM GOAL #4   Title IEilleen Kempfwill improve 30'' STS (MCID 2) to >/= 10x to show improved LE strength and improved transfers  EVAL: 7x    Baseline 9/1: MET 10x    Status Achieved      PT LONG TERM GOAL #5   Title Darlene TEPwill be able to stand for >30'' in semi tandem stance, to show a significant improvement in balance in order to reduce fall risk  EVAL: <10'' in semi-tandem stance bil    Baseline 9/1: MET bil    Status Achieved                   Plan - 11/19/20 1120     Clinical Impression Statement Darlene ADLERhas progressed well with therapy.  Improved impairments include: gait, balance, LE strength.  Functional improvements include: step navigation, navigation in community, ability to complete ADLs such as shopping and light housework.  Progressions needed include: aquatic therapy and increased endurance/activity tolerance which will come with time; continue HEP.  Barriers to progress include: some fear avoidance and nerve pathology affecting R DF strength.  Please see baseline  and/or status section in "Goals" for specific progress on short term and long term goals established at evaluation.  I recommend D/C home with HEP; pt agrees with plan.    PT Treatment/Interventions ADLs/Self Care Home Management;Aquatic Therapy;Therapeutic exercise;Therapeutic activities;Neuromuscular re-education;Manual techniques;Iontophoresis 456mml Dexamethasone;Dry needling;Gait training    PT Next Visit Plan review LTGs and HEP for discharge    [email or web address]  Consulted and Agree with Plan of Care Patient             Patient will benefit from skilled therapeutic intervention in order to improve the following deficits and impairments:  Decreased endurance, Abnormal gait, Decreased activity tolerance, Decreased strength, Pain, Decreased balance  Visit Diagnosis: Muscle weakness (generalized)  Other abnormalities of gait and mobility  Chronic right-sided low back pain, unspecified whether sciatica present  Abnormal posture     Problem List Patient Active Problem List   Diagnosis Date Noted   Lumbar radiculopathy 05/21/2020   Statin intolerance 01/16/2018   Bilateral leg edema 12/21/2017   History of colonic polyps 10/18/2017   Dyslipidemia 10/18/2017   Essential hypertension 03/26/2016   Family history of colon cancer 03/26/2016   Osteopenia 08/28/2015   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   Spinal stenosis of lumbar region 12/05/2013   Morbid obesity ([place]) 12/19/2012   Diabetes mellitus with coincident hypertension ([place]) 05/15/2008   Allergic rhinitis 05/15/2008   Asthma, mild intermittent 05/15/2008   Osteoarthritis 05/15/2008   HEADACHE 05/15/2008    Mathis Dad, PT 11/19/2020, 11:26 AM  [facility] [address] [place], [place], [address] Phone: [phone]   Fax:  [phone]  Name: Darlene Mclaughlin MRN: [id number] Date of Birth: [birth year]

## 2020-11-24 DIAGNOSIS — M5416 Radiculopathy, lumbar region: Secondary | ICD-10-CM | POA: Diagnosis not present

## 2020-11-27 DIAGNOSIS — J3089 Other allergic rhinitis: Secondary | ICD-10-CM | POA: Diagnosis not present

## 2020-11-27 DIAGNOSIS — J301 Allergic rhinitis due to pollen: Secondary | ICD-10-CM | POA: Diagnosis not present

## 2020-12-02 DIAGNOSIS — E119 Type 2 diabetes mellitus without complications: Secondary | ICD-10-CM | POA: Diagnosis not present

## 2020-12-02 NOTE — Progress Notes (Signed)
[place]   [phone] [phone]   Clinic Follow up Note   Patient Care Team: Isaac Bliss, Rayford Halsted, MD as PCP - General (Internal Medicine) Erroll Luna, MD as Consulting Physician (General Surgery) Arloa Koh, MD (Inactive) as Consulting Physician (Radiation Oncology) Truitt Merle, MD as Consulting Physician (Hematology) Sylvan Cheese, NP as Nurse Practitioner (Hematology and Oncology)  Date of Service:  12/03/2020  CHIEF COMPLAINT: Follow up right breast DCIS   ASSESSMENT & PLAN:  Darlene Mclaughlin is a 70 y.o. female with   1. right breast DCIS, ER/PR strongly positive -She was diagnosed in 10/2014. She is s/p right breast lumpectomy. She is cured. Any form of adjuvant treatment is for preventive  -Adjuvant radiation not recommended due to lack of residual malignancy at time of definitive surgery -Given her ER/PR positive disease, she started on anastrozole for chemoprevention in 03/2015 but stopped in July 2019 due to worsening arthralgia.  -From a breast cancer standpoint she is clinically doing well. Her physical exam and her 11/2019 mammogram were unremarkable. There is no clinical concern for recurrence. -Continue surveillance. Next mammogram is scheduled for 12/11/20 -pt would like to f/u with [place] yearly     2. Bone Health -Repeated a bone density scan in February 2017 showed osteopenia, no high risk for fracture.  -She is on multiple vitamin which contains vitamin D thousand unit. I also encouraged her to take calcium 600 mg once daily.  -continue calcium and vit D   3. Hypertension, diabetes, arthritis -Continue to follow up with her primary care physician     Plan -f/u in 1 year with [place], no lab  -Mammogram on 12/11/2020 -I order DEXA scan to be done next month   SUMMARY OF ONCOLOGIC HISTORY: Oncology History Overview Note  Breast cancer of lower-outer quadrant of right female breast [facility])   Staging  form: Breast, AJCC 7th Edition     Clinical stage from 11/15/2014: Stage 0 (Tis (DCIS), N0, M0) - Signed by Truitt Merle, MD on 11/26/2014     Ductal carcinoma in situ (DCIS) of right breast  11/06/2014 Mammogram   Screening mammogram showed calcifications in the lower outer quadrant of the right breast, which was confirmed by diagnostic mammogram   11/15/2014 Initial Biopsy   Breast core needle biopsy showed ductal carcinoma in situ with calcifications.   11/15/2014 Receptors her2   ER+ (100%); PR+ (90%)   11/15/2014 Clinical Stage   Stage 0: Tis N0   12/17/2014 Surgery   Right breast lumpectomy.   12/17/2014 Pathology Results   Right breast lumpectomy showed usual ductal Hyperplasia and fibroadenoma. No residual ductal carcinoma in situ.     Radiation Therapy   Not recommended due to lack of residual malignancy at time of definitive surgery   03/28/2015 - 08/2017 Anti-estrogen oral therapy   Anastrozole 1 mg daily begun 03/28/2015 and stopped 08/2017 due to poor tolerance.    05/01/2015 Survivorship   Survivorship visit completed and copy of care plan provided to patient   11/06/2015 Imaging   MM DIAG BREAST TOMO BILATERAL 11/06/15 IMPRESSION: No mammographic evidence of malignancy. BI-RADS CATEGORY  2: Benign.   08/04/2016 Pathology Results   Diagnosis  Surgical [P], cecum, polyp - TUBULAR ADENOMA. - NO HIGH GRADE DYSPLASIA OR MALIGNANCY.   08/04/2016 Procedure   Colonoscopy A 2 mm polyp was found in the cecum. The polyp was sessile. The polyp was removed with a cold snare. Resection and retrieval were complete. Findings: -  The exam was otherwise without abnormality on direct and retroflexion views.   11/11/2016 Mammogram   IMPRESSION: 1. No mammographic evidence of malignancy. 2. Stable right breast postsurgical changes.      CURRENT THERAPY:  Surveillance   INTERVAL HISTORY:  Darlene Mclaughlin is here for a follow up of her right breast cancer. She presents to the clinic  alone. She underwent back surgery in March 2022, and her back pain is much improved.  She has completed physical therapy.  She still has arthritis especially in the left arm, no other new complaints.  She denies any breast or pain.   REVIEW OF SYSTEMS:   Constitutional: Denies fevers, chills or abnormal weight loss Eyes: Denies blurriness of vision Ears, nose, mouth, throat, and face: Denies mucositis or sore throat Respiratory: Denies cough, dyspnea or wheezes Cardiovascular: Denies palpitation, chest discomfort or lower extremity swelling Gastrointestinal:  Denies nausea, heartburn or change in bowel habits Skin: Denies abnormal skin rashes MSK: (+) Arthritis, mainly in her left wrist.   Lymphatics: Denies new lymphadenopathy or easy bruising Neurological:Denies numbness, tingling or new weaknesses Behavioral/Psych: Mood is stable, no new changes  All other systems were reviewed with the patient and are negative.  MEDICAL HISTORY:  Past Medical History:  Diagnosis Date   ALLERGIC RHINITIS 05/15/2008   Anemia    ASTHMA 05/15/2008   Asthma    Blood transfusion without reported diagnosis 1991   anemia   Breast cancer ([place]) 2016   right breast   Cancer ([place])    DCIS right breast   Diabetes mellitus without complication ([place])    GERD (gastroesophageal reflux disease)    Headache(784.0) 05/15/2008   HYPERTENSION 05/15/2008   IMPAIRED GLUCOSE TOLERANCE 05/15/2008   Obesity    OSTEOARTHRITIS 05/15/2008   back    SURGICAL HISTORY: Past Surgical History:  Procedure Laterality Date   ABDOMINAL HYSTERECTOMY     ANTERIOR LAT LUMBAR FUSION Left 05/21/2020   Procedure: LUMBAR TWO-THREE, LUMBAR THREE-FOUR DIRECT LATERAL LUMBAR INTERBODY FUSION;  Surgeon: Vallarie Mare, MD;  Location: [place];  Service: Neurosurgery;  Laterality: Left;   APPLICATION OF ROBOTIC ASSISTANCE FOR SPINAL PROCEDURE N/A 05/21/2020   Procedure: APPLICATION OF ROBOTIC ASSISTANCE FOR SPINAL PROCEDURE;  Surgeon:  Vallarie Mare, MD;  Location: [place];  Service: Neurosurgery;  Laterality: N/A;   BREAST LUMPECTOMY Right 12/17/2014   BREAST LUMPECTOMY WITH RADIOACTIVE SEED LOCALIZATION Right 12/17/2014   Procedure: RIGHT BREAST RADIOACTIVE SEED GUIDED PARTIAL MASTECTOMY;  Surgeon: Erroll Luna, MD;  Location: [place];  Service: General;  Laterality: Right;   CESAREAN SECTION     KNEE SURGERY     arthroscopic left   TRANSFORAMINAL LUMBAR INTERBODY FUSION W/ MIS 2 LEVEL Right 05/21/2020   Procedure: LUMBAR FOUR-FIVE, LUMBAR FIVE-SACRAL ONE MINIMALLY INVASIVE TRANSFORAMINAL LUMBAR INTERBODY FUSION WITH INSTRUMENTATION LUMBAR TWO-SACRAL ONE;  Surgeon: Vallarie Mare, MD;  Location: [place];  Service: Neurosurgery;  Laterality: Right;    I have reviewed the social history and family history with the patient and they are unchanged from previous note.  ALLERGIES:  is allergic to atorvastatin, hydrochlorothiazide, macrobid [nitrofurantoin monohyd macro], naproxen, simvastatin, and zanaflex [tizanidine hcl].  MEDICATIONS:  Current Outpatient Medications  Medication Sig Dispense Refill   Accu-Chek FastClix Lancets MISC 1 EACH BY DOES NOT APPLY ROUTE DAILY. USE FOR ACCU-CHEK GUIDE 102 each 3   ACCU-CHEK GUIDE test strip 1 EACH BY OTHER ROUTE DAILY. 100 strip 3   acetaminophen (TYLENOL) 650 MG CR tablet  Take 650 mg by mouth every 8 (eight) hours as needed for pain.     aspirin 81 MG tablet Take 81 mg by mouth daily.     azelastine (OPTIVAR) 0.05 % ophthalmic solution Place 1 drop into both eyes daily as needed (allergies).     blood glucose meter kit and supplies KIT Dispense based on patient and insurance preference. Use up to twice daily as directed. 1 each 0   CALCIUM PO Take 1,000 mg by mouth daily.     cetirizine (ZYRTEC) 10 MG tablet Take 10 mg by mouth daily as needed for allergies.     chlorthalidone (HYGROTON) 25 MG tablet TAKE 1 TABLET BY MOUTH EVERY DAY 90 tablet 1    cyclobenzaprine (FLEXERIL) 10 MG tablet Take 1 tablet (10 mg total) by mouth 3 (three) times daily as needed for muscle spasms. 60 tablet 0   docusate sodium (COLACE) 100 MG capsule Take 1 capsule (100 mg total) by mouth 2 (two) times daily. 60 capsule 2   EPIPEN 2-PAK 0.3 MG/0.3ML SOAJ injection Inject 0.3 mg into the muscle as needed for anaphylaxis.  1   fluticasone (FLONASE) 50 MCG/ACT nasal spray Place 1 spray into both nostrils daily. (Patient taking differently: Place 1 spray into both nostrils daily as needed for allergies.) 16 g 5   Homeopathic Products (ARNICA EX) Apply 1 application topically daily as needed (pain). Similasan Arnica Active pain relieving spray     KLOR-CON M20 20 MEQ tablet TAKE 1 TABLET BY MOUTH TWICE A DAY 180 tablet 1   Melatonin 10 MG TABS Take 10 mg by mouth at bedtime as needed (sleep).     metFORMIN (GLUCOPHAGE-XR) 500 MG 24 hr tablet TAKE 2 TABLETS (1,000 MG TOTAL) BY MOUTH DAILY WITH SUPPER. 180 tablet 1   Multiple Vitamin (MULTIVITAMIN) tablet Take 1 tablet by mouth daily.     NON FORMULARY EVERY OTHER WEEK ALLERGY INJECTIONS     OVER THE COUNTER MEDICATION Apply 1 application topically daily as needed (pain). KT recovery pain relieving gel (Patient not taking: Reported on 09/18/2020)     oxyCODONE-acetaminophen (PERCOCET/ROXICET) 5-325 MG tablet Take 1-2 tablets by mouth every 6 (six) hours as needed for moderate pain or severe pain. (Patient not taking: Reported on 09/18/2020) 50 tablet 0   PROAIR HFA 108 (90 BASE) MCG/ACT inhaler Inhale 1 puff into the lungs every 6 (six) hours as needed for shortness of breath or wheezing.  0   Simethicone (GAS-X PO) Take 1 tablet by mouth daily as needed (gas).     No current facility-administered medications for this visit.    PHYSICAL EXAMINATION: ECOG PERFORMANCE STATUS: 1 - Symptomatic but completely ambulatory  Vitals:   12/03/20 1010  BP: 140/76  Pulse: 94  Resp: 18  Temp: 98.2 F (36.8 C)  SpO2: 100%     Filed Weights   12/03/20 1010  Weight: 237 lb 9.6 oz (107.8 kg)     GENERAL:alert, no distress and comfortable SKIN: skin color, texture, turgor are normal, no rashes or significant lesions EYES: normal, Conjunctiva are pink and non-injected, sclera clear  NECK: supple, thyroid normal size, non-tender, without nodularity LYMPH:  no palpable lymphadenopathy in the cervical, axillary  LUNGS: clear to auscultation and percussion with normal breathing effort HEART: regular rate & rhythm and no murmurs and no lower extremity edema ABDOMEN:abdomen soft, non-tender and normal bowel sounds Musculoskeletal:no cyanosis of digits and no clubbing  NEURO: alert & oriented x 3 with fluent speech, no focal  motor/sensory deficits BREAST: S/p right lumpectomy: Surgical incision healed well. No palpable mass, nodules or adenopathy bilaterally. Breast exam benign.   LABORATORY DATA:  I have reviewed the data as listed CBC Latest Ref Rng & Units 05/21/2020 05/21/2020 05/21/2020  WBC 4.0 - 10.5 K/uL - - -  Hemoglobin 12.0 - 15.0 g/dL 9.9(L) 11.9(L) 11.9(L)  Hematocrit 36.0 - 46.0 % 29.0(L) 35.0(L) 35.0(L)  Platelets 150 - 400 K/uL - - -     CMP Latest Ref Rng & Units 05/21/2020 05/21/2020 05/21/2020  Glucose 70 - 99 mg/dL - 186(H) 214(H)  BUN 8 - 23 mg/dL - 9 10  Creatinine 0.44 - 1.00 mg/dL - 0.30(L) 0.30(L)  Sodium 135 - 145 mmol/L 140 140 139  Potassium 3.5 - 5.1 mmol/L 3.3(L) 3.3(L) 3.0(L)  Chloride 98 - 111 mmol/L - 103 102  CO2 22 - 32 mmol/L - - -  Calcium 8.9 - 10.3 mg/dL - - -  Total Protein 6.1 - 8.1 g/dL - - -  Total Bilirubin 0.2 - 1.2 mg/dL - - -  Alkaline Phos 39 - 117 U/L - - -  AST 10 - 35 U/L - - -  ALT 6 - 29 U/L - - -      RADIOGRAPHIC STUDIES: I have personally reviewed the radiological images as listed and agreed with the findings in the report. No results found.     No problem-specific Assessment & Plan notes found for this encounter.   Orders Placed This  Encounter  Procedures   DG Bone Density    Standing Status:   Future    Standing Expiration Date:   12/03/2021    Order Specific Question:   Reason for Exam (SYMPTOM  OR DIAGNOSIS REQUIRED)    Answer:   screening    Order Specific Question:   [facility]?    [facility]    All questions were answered. The patient knows to call the clinic with any problems, questions or concerns. No barriers to learning was detected. I spent 15 minutes counseling the patient face to face. The total time spent in the appointment was 20 minutes and more than 50% was on counseling and review of test results     Truitt Merle, MD 12/03/2020

## 2020-12-03 ENCOUNTER — Inpatient Hospital Stay: Payer: Medicare PPO | Attending: Hematology | Admitting: Hematology

## 2020-12-03 ENCOUNTER — Other Ambulatory Visit: Payer: Self-pay

## 2020-12-03 ENCOUNTER — Encounter: Payer: Self-pay | Admitting: Hematology

## 2020-12-03 VITALS — BP 140/76 | HR 94 | Temp 98.2°F | Resp 18 | Ht 64.0 in | Wt 237.6 lb

## 2020-12-03 DIAGNOSIS — I10 Essential (primary) hypertension: Secondary | ICD-10-CM | POA: Diagnosis not present

## 2020-12-03 DIAGNOSIS — M1990 Unspecified osteoarthritis, unspecified site: Secondary | ICD-10-CM | POA: Diagnosis not present

## 2020-12-03 DIAGNOSIS — E669 Obesity, unspecified: Secondary | ICD-10-CM | POA: Insufficient documentation

## 2020-12-03 DIAGNOSIS — K219 Gastro-esophageal reflux disease without esophagitis: Secondary | ICD-10-CM | POA: Diagnosis not present

## 2020-12-03 DIAGNOSIS — E2839 Other primary ovarian failure: Secondary | ICD-10-CM

## 2020-12-03 DIAGNOSIS — E119 Type 2 diabetes mellitus without complications: Secondary | ICD-10-CM | POA: Insufficient documentation

## 2020-12-03 DIAGNOSIS — J3089 Other allergic rhinitis: Secondary | ICD-10-CM | POA: Diagnosis not present

## 2020-12-03 DIAGNOSIS — J301 Allergic rhinitis due to pollen: Secondary | ICD-10-CM | POA: Diagnosis not present

## 2020-12-03 DIAGNOSIS — Z7984 Long term (current) use of oral hypoglycemic drugs: Secondary | ICD-10-CM | POA: Diagnosis not present

## 2020-12-03 DIAGNOSIS — Z170 Estrogen receptor positive status [ER+]: Secondary | ICD-10-CM | POA: Diagnosis not present

## 2020-12-03 DIAGNOSIS — C50511 Malignant neoplasm of lower-outer quadrant of right female breast: Secondary | ICD-10-CM | POA: Insufficient documentation

## 2020-12-03 DIAGNOSIS — J45909 Unspecified asthma, uncomplicated: Secondary | ICD-10-CM | POA: Diagnosis not present

## 2020-12-03 DIAGNOSIS — Z7982 Long term (current) use of aspirin: Secondary | ICD-10-CM | POA: Insufficient documentation

## 2020-12-03 DIAGNOSIS — M8580 Other specified disorders of bone density and structure, unspecified site: Secondary | ICD-10-CM | POA: Diagnosis not present

## 2020-12-11 ENCOUNTER — Other Ambulatory Visit: Payer: Self-pay

## 2020-12-11 ENCOUNTER — Ambulatory Visit
Admission: RE | Admit: 2020-12-11 | Discharge: 2020-12-11 | Disposition: A | Discharge status: Home / Self Care | Payer: Medicare PPO | Source: Ambulatory Visit | Attending: Hematology | Admitting: Hematology

## 2020-12-11 ENCOUNTER — Telehealth: Payer: Self-pay

## 2020-12-11 DIAGNOSIS — J3089 Other allergic rhinitis: Secondary | ICD-10-CM | POA: Diagnosis not present

## 2020-12-11 DIAGNOSIS — J301 Allergic rhinitis due to pollen: Secondary | ICD-10-CM | POA: Diagnosis not present

## 2020-12-11 DIAGNOSIS — Z1231 Encounter for screening mammogram for malignant neoplasm of breast: Secondary | ICD-10-CM

## 2020-12-11 DIAGNOSIS — Z23 Encounter for immunization: Secondary | ICD-10-CM | POA: Diagnosis not present

## 2020-12-11 NOTE — Telephone Encounter (Signed)
Last OV 05/08/20. Recommendation was to f/u in 70mo for DM. No noted future appt.  LVM for pt to call back to schedule f/u with PCP. Will also send mychart message.

## 2020-12-17 NOTE — Telephone Encounter (Signed)
Pt sch for 12-31-2020

## 2020-12-18 ENCOUNTER — Other Ambulatory Visit: Payer: Self-pay | Admitting: Internal Medicine

## 2020-12-19 DIAGNOSIS — J3089 Other allergic rhinitis: Secondary | ICD-10-CM | POA: Diagnosis not present

## 2020-12-19 DIAGNOSIS — J301 Allergic rhinitis due to pollen: Secondary | ICD-10-CM | POA: Diagnosis not present

## 2020-12-22 ENCOUNTER — Other Ambulatory Visit: Payer: Self-pay | Admitting: Internal Medicine

## 2020-12-26 DIAGNOSIS — J301 Allergic rhinitis due to pollen: Secondary | ICD-10-CM | POA: Diagnosis not present

## 2020-12-26 DIAGNOSIS — J3089 Other allergic rhinitis: Secondary | ICD-10-CM | POA: Diagnosis not present

## 2020-12-30 ENCOUNTER — Other Ambulatory Visit: Payer: Self-pay

## 2020-12-31 ENCOUNTER — Ambulatory Visit: Payer: Medicare PPO | Admitting: Internal Medicine

## 2020-12-31 VITALS — BP 130/70 | HR 103 | Temp 98.4°F | Wt 240.5 lb

## 2020-12-31 DIAGNOSIS — D0511 Intraductal carcinoma in situ of right breast: Secondary | ICD-10-CM | POA: Diagnosis not present

## 2020-12-31 DIAGNOSIS — E785 Hyperlipidemia, unspecified: Secondary | ICD-10-CM

## 2020-12-31 DIAGNOSIS — Z789 Other specified health status: Secondary | ICD-10-CM | POA: Diagnosis not present

## 2020-12-31 DIAGNOSIS — E119 Type 2 diabetes mellitus without complications: Secondary | ICD-10-CM

## 2020-12-31 DIAGNOSIS — I10 Essential (primary) hypertension: Secondary | ICD-10-CM

## 2020-12-31 DIAGNOSIS — J301 Allergic rhinitis due to pollen: Secondary | ICD-10-CM | POA: Diagnosis not present

## 2020-12-31 DIAGNOSIS — J3089 Other allergic rhinitis: Secondary | ICD-10-CM | POA: Diagnosis not present

## 2020-12-31 LAB — LIPID PANEL
Cholesterol: 207 mg/dL — ABNORMAL HIGH (ref 0–200)
HDL: 50.9 mg/dL (ref >39.00)
LDL Cholesterol: 128 mg/dL — ABNORMAL HIGH (ref 0–99)
NonHDL: 156.09
Total CHOL/HDL Ratio: 4
Triglycerides: 140 mg/dL (ref 0.0–149.0)
VLDL: 28 mg/dL (ref 0.0–40.0)

## 2020-12-31 LAB — CBC WITH DIFFERENTIAL/PLATELET
Basophils Absolute: 0 10*3/uL (ref 0.0–0.1)
Basophils Relative: 0.6 % (ref 0.0–3.0)
Eosinophils Absolute: 0.1 10*3/uL (ref 0.0–0.7)
Eosinophils Relative: 2.2 % (ref 0.0–5.0)
HCT: 38.6 % (ref 36.0–46.0)
Hemoglobin: 12.8 g/dL (ref 12.0–15.0)
Lymphocytes Relative: 35.8 % (ref 12.0–46.0)
Lymphs Abs: 2 10*3/uL (ref 0.7–4.0)
MCHC: 33.1 g/dL (ref 30.0–36.0)
MCV: 90.1 fl (ref 78.0–100.0)
Monocytes Absolute: 0.5 10*3/uL (ref 0.1–1.0)
Monocytes Relative: 8.5 % (ref 3.0–12.0)
Neutro Abs: 2.9 10*3/uL (ref 1.4–7.7)
Neutrophils Relative %: 52.9 % (ref 43.0–77.0)
Platelets: 176 10*3/uL (ref 150.0–400.0)
RBC: 4.28 Mil/uL (ref 3.87–5.11)
RDW: 14.7 % (ref 11.5–15.5)
WBC: 5.5 10*3/uL (ref 4.0–10.5)

## 2020-12-31 LAB — COMPREHENSIVE METABOLIC PANEL
ALT: 14 U/L (ref 0–35)
AST: 19 U/L (ref 0–37)
Albumin: 4.3 g/dL (ref 3.5–5.2)
Alkaline Phosphatase: 84 U/L (ref 39–117)
BUN: 14 mg/dL (ref 6–23)
CO2: 31 mEq/L (ref 19–32)
Calcium: 9.8 mg/dL (ref 8.4–10.5)
Chloride: 101 mEq/L (ref 96–112)
Creatinine, Ser: 0.63 mg/dL (ref 0.40–1.20)
GFR: 88.73 mL/min (ref >60.00)
Glucose, Bld: 123 mg/dL — ABNORMAL HIGH (ref 70–99)
Potassium: 3.4 mEq/L — ABNORMAL LOW (ref 3.5–5.1)
Sodium: 140 mEq/L (ref 135–145)
Total Bilirubin: 0.5 mg/dL (ref 0.2–1.2)
Total Protein: 7.8 g/dL (ref 6.0–8.3)

## 2020-12-31 LAB — POCT GLYCOSYLATED HEMOGLOBIN (HGB A1C): Hemoglobin A1C: 6.5 % — AB (ref 4.0–5.6)

## 2020-12-31 NOTE — Addendum Note (Signed)
Addended by: Amanda Cockayne on: 12/31/2020 08:55 AM   Modules accepted: Orders

## 2020-12-31 NOTE — Progress Notes (Signed)
Established Patient Office Visit     This visit occurred during the SARS-CoV-2 public health emergency.  Safety protocols were in place, including screening questions prior to the visit, additional usage of staff PPE, and extensive cleaning of exam room while observing appropriate contact time as indicated for disinfecting solutions.    CC/Reason for Visit: Follow-up chronic conditions  HPI: Darlene Mclaughlin is a 70 y.o. female who is coming in today for the above mentioned reasons. Past Medical History is significant for: Hypertension, hyperlipidemia, type 2 diabetes, morbid obesity.  I have not seen her since March.  At that time she had significant lumbar radiculopathy and underwent L2/S1 fusion by Dr. [name] in April.  She has been doing well, has completed physical therapy.  She has had her flu vaccine, she has not yet had her COVID booster.  She is overdue for labs.  She has no acute concerns.   Past Medical/Surgical History: Past Medical History:  Diagnosis Date   ALLERGIC RHINITIS 05/15/2008   Anemia    ASTHMA 05/15/2008   Asthma    Blood transfusion without reported diagnosis 1991   anemia   Breast cancer ([place]) 2016   right breast   Cancer ([place])    DCIS right breast   Diabetes mellitus without complication ([place])    GERD (gastroesophageal reflux disease)    Headache(784.0) 05/15/2008   HYPERTENSION 05/15/2008   IMPAIRED GLUCOSE TOLERANCE 05/15/2008   Obesity    OSTEOARTHRITIS 05/15/2008   back    Past Surgical History:  Procedure Laterality Date   ABDOMINAL HYSTERECTOMY     ANTERIOR LAT LUMBAR FUSION Left 05/21/2020   Procedure: LUMBAR TWO-THREE, LUMBAR THREE-FOUR DIRECT LATERAL LUMBAR INTERBODY FUSION;  Surgeon: Vallarie Mare, MD;  Location: [facility];  Service: Neurosurgery;  Laterality: Left;   APPLICATION OF ROBOTIC ASSISTANCE FOR SPINAL PROCEDURE N/A 05/21/2020   Procedure: APPLICATION OF ROBOTIC ASSISTANCE FOR SPINAL PROCEDURE;  Surgeon: Vallarie Mare,  MD;  Location: [facility];  Service: Neurosurgery;  Laterality: N/A;   BREAST LUMPECTOMY Right 12/17/2014   BREAST LUMPECTOMY WITH RADIOACTIVE SEED LOCALIZATION Right 12/17/2014   Procedure: RIGHT BREAST RADIOACTIVE SEED GUIDED PARTIAL MASTECTOMY;  Surgeon: Erroll Luna, MD;  Location: [facility];  Service: General;  Laterality: Right;   CESAREAN SECTION     KNEE SURGERY     arthroscopic left   TRANSFORAMINAL LUMBAR INTERBODY FUSION W/ MIS 2 LEVEL Right 05/21/2020   Procedure: LUMBAR FOUR-FIVE, LUMBAR FIVE-SACRAL ONE MINIMALLY INVASIVE TRANSFORAMINAL LUMBAR INTERBODY FUSION WITH INSTRUMENTATION LUMBAR TWO-SACRAL ONE;  Surgeon: Vallarie Mare, MD;  Location: [facility];  Service: Neurosurgery;  Laterality: Right;    Social History:  reports that she has never smoked. She has never used smokeless tobacco. She reports current alcohol use. She reports that she does not use drugs.  Allergies: Allergies  Allergen Reactions   Atorvastatin     Hallucinations    Hydrochlorothiazide     Muscle pain   Macrobid [Nitrofurantoin Monohyd Macro]      Liver problems   Naproxen Swelling    Mouth and tongue swelling   Simvastatin     Hallucinations, muscle pains   Zanaflex [Tizanidine Hcl] Other (See Comments)     Liver problems    Family History:  Family History  Problem Relation Age of Onset   Diabetes Mother    Hypertension Mother    Breast cancer Mother 108       Breast cancer twice    Hyperlipidemia  Father    Colon cancer Brother        27 th brother   Diabetes Brother    Pancreatic cancer Brother        middle brother   Stomach cancer Neg Hx      Current Outpatient Medications:    Accu-Chek FastClix Lancets MISC, 1 EACH BY DOES NOT APPLY ROUTE DAILY. USE FOR ACCU-CHEK GUIDE, Disp: 102 each, Rfl: 3   ACCU-CHEK GUIDE test strip, 1 EACH BY OTHER ROUTE DAILY., Disp: 100 strip, Rfl: 3   acetaminophen (TYLENOL) 650 MG CR tablet, Take 650 mg by mouth every 8 (eight) hours as  needed for pain., Disp: , Rfl:    aspirin 81 MG tablet, Take 81 mg by mouth daily., Disp: , Rfl:    azelastine (OPTIVAR) 0.05 % ophthalmic solution, Place 1 drop into both eyes daily as needed (allergies)., Disp: , Rfl:    blood glucose meter kit and supplies KIT, Dispense based on patient and insurance preference. Use up to twice daily as directed., Disp: 1 each, Rfl: 0   CALCIUM PO, Take 1,000 mg by mouth daily., Disp: , Rfl:    cetirizine (ZYRTEC) 10 MG tablet, Take 10 mg by mouth daily as needed for allergies., Disp: , Rfl:    chlorthalidone (HYGROTON) 25 MG tablet, TAKE 1 TABLET BY MOUTH EVERY DAY, Disp: 90 tablet, Rfl: 1   cyclobenzaprine (FLEXERIL) 10 MG tablet, Take 1 tablet (10 mg total) by mouth 3 (three) times daily as needed for muscle spasms., Disp: 60 tablet, Rfl: 0   EPIPEN 2-PAK 0.3 MG/0.3ML SOAJ injection, Inject 0.3 mg into the muscle as needed for anaphylaxis., Disp: , Rfl: 1   fluticasone (FLONASE) 50 MCG/ACT nasal spray, Place 1 spray into both nostrils daily. (Patient taking differently: Place 1 spray into both nostrils daily as needed for allergies.), Disp: 16 g, Rfl: 5   KLOR-CON M20 20 MEQ tablet, TAKE 1 TABLET BY MOUTH TWICE A DAY, Disp: 180 tablet, Rfl: 1   Melatonin 10 MG TABS, Take 10 mg by mouth at bedtime as needed (sleep)., Disp: , Rfl:    metFORMIN (GLUCOPHAGE-XR) 500 MG 24 hr tablet, TAKE 2 TABLETS (1,000 MG TOTAL) BY MOUTH DAILY WITH SUPPER., Disp: 180 tablet, Rfl: 0   Multiple Vitamin (MULTIVITAMIN) tablet, Take 1 tablet by mouth daily., Disp: , Rfl:    NON FORMULARY, EVERY OTHER WEEK ALLERGY INJECTIONS, Disp: , Rfl:    PROAIR HFA 108 (90 BASE) MCG/ACT inhaler, Inhale 1 puff into the lungs every 6 (six) hours as needed for shortness of breath or wheezing., Disp: , Rfl: 0   Simethicone (GAS-X PO), Take 1 tablet by mouth daily as needed (gas)., Disp: , Rfl:   Review of Systems:  Constitutional: Denies fever, chills, diaphoresis, appetite change and fatigue.   HEENT: Denies photophobia, eye pain, redness, hearing loss, ear pain, congestion, sore throat, rhinorrhea, sneezing, mouth sores, trouble swallowing, neck pain, neck stiffness and tinnitus.   Respiratory: Denies SOB, DOE, cough, chest tightness,  and wheezing.   Cardiovascular: Denies chest pain, palpitations and leg swelling.  Gastrointestinal: Denies nausea, vomiting, abdominal pain, diarrhea, constipation, blood in stool and abdominal distention.  Genitourinary: Denies dysuria, urgency, frequency, hematuria, flank pain and difficulty urinating.  Endocrine: Denies: hot or cold intolerance, sweats, changes in hair or nails, polyuria, polydipsia. Musculoskeletal: Denies myalgias, back pain, joint swelling, arthralgias and gait problem.  Skin: Denies pallor, rash and wound.  Neurological: Denies dizziness, seizures, syncope, weakness, light-headedness, numbness and headaches.  Hematological: Denies adenopathy. Easy bruising, personal or family bleeding history  Psychiatric/Behavioral: Denies suicidal ideation, mood changes, confusion, nervousness, sleep disturbance and agitation    Physical Exam: Vitals:   12/31/20 0828  BP: 130/70  Pulse: (!) 103  Temp: 98.4 F (36.9 C)  TempSrc: Oral  SpO2: 99%  Weight: 240 lb 8 oz (109.1 kg)    Body mass index is 41.28 kg/m.   Constitutional: NAD, calm, comfortable, obese Eyes: PERRL, lids and conjunctivae normal, wears corrective lenses ENMT: Mucous membranes are moist.  Respiratory: clear to auscultation bilaterally, no wheezing, no crackles. Normal respiratory effort. No accessory muscle use.  Cardiovascular: Regular rate and rhythm, no murmurs / rubs / gallops. No extremity edema.  Psychiatric: Normal judgment and insight. Alert and oriented x 3. Normal mood.    Impression and Plan:  Essential hypertension  - Plan: CBC with Differential/Platelet, Comprehensive metabolic panel -Fair control.  Dyslipidemia - Plan: Lipid panel Statin  intolerance  Morbid obesity ([place]) -Discussed healthy lifestyle, including increased physical activity and better food choices to promote weight loss.  Diabetes mellitus with coincident hypertension (HCC) -A1c demonstrates good control at 6.5.  Ductal carcinoma in situ (DCIS) of right breast -Noted, followed by oncology  Time spent: 30 minutes reviewing chart, interviewing and examining patient and formulating plan of care.   Patient Instructions  -Nice seeing you today!!  -Lab work today; will notify you once results are available.  -Remember your COVID booster at the pharmacy.  -Schedule follow up in 3 months.    Lelon Frohlich, MD [facility]

## 2020-12-31 NOTE — Patient Instructions (Signed)
-  Nice seeing you today!!  -Lab work today; will notify you once results are available.  -Remember your COVID booster at the pharmacy.  -Schedule follow up in 3 months.

## 2021-01-02 ENCOUNTER — Other Ambulatory Visit: Payer: Self-pay | Admitting: Internal Medicine

## 2021-01-07 DIAGNOSIS — J301 Allergic rhinitis due to pollen: Secondary | ICD-10-CM | POA: Diagnosis not present

## 2021-01-07 DIAGNOSIS — J3089 Other allergic rhinitis: Secondary | ICD-10-CM | POA: Diagnosis not present

## 2021-01-13 DIAGNOSIS — J3089 Other allergic rhinitis: Secondary | ICD-10-CM | POA: Diagnosis not present

## 2021-01-13 DIAGNOSIS — J301 Allergic rhinitis due to pollen: Secondary | ICD-10-CM | POA: Diagnosis not present

## 2021-01-21 DIAGNOSIS — J301 Allergic rhinitis due to pollen: Secondary | ICD-10-CM | POA: Diagnosis not present

## 2021-01-21 DIAGNOSIS — J3089 Other allergic rhinitis: Secondary | ICD-10-CM | POA: Diagnosis not present

## 2021-01-29 DIAGNOSIS — J301 Allergic rhinitis due to pollen: Secondary | ICD-10-CM | POA: Diagnosis not present

## 2021-01-29 DIAGNOSIS — J3089 Other allergic rhinitis: Secondary | ICD-10-CM | POA: Diagnosis not present

## 2021-02-06 DIAGNOSIS — J301 Allergic rhinitis due to pollen: Secondary | ICD-10-CM | POA: Diagnosis not present

## 2021-02-06 DIAGNOSIS — J3089 Other allergic rhinitis: Secondary | ICD-10-CM | POA: Diagnosis not present

## 2021-02-11 DIAGNOSIS — J301 Allergic rhinitis due to pollen: Secondary | ICD-10-CM | POA: Diagnosis not present

## 2021-02-11 DIAGNOSIS — J3089 Other allergic rhinitis: Secondary | ICD-10-CM | POA: Diagnosis not present

## 2021-02-17 ENCOUNTER — Ambulatory Visit (INDEPENDENT_AMBULATORY_CARE_PROVIDER_SITE_OTHER): Payer: Medicare PPO

## 2021-02-17 ENCOUNTER — Ambulatory Visit (HOSPITAL_COMMUNITY): Payer: Medicare PPO

## 2021-02-17 ENCOUNTER — Encounter (HOSPITAL_COMMUNITY): Payer: Self-pay | Admitting: Emergency Medicine

## 2021-02-17 ENCOUNTER — Other Ambulatory Visit: Payer: Self-pay

## 2021-02-17 ENCOUNTER — Ambulatory Visit (HOSPITAL_COMMUNITY)
Admission: EM | Admit: 2021-02-17 | Discharge: 2021-02-17 | Disposition: A | Discharge status: Home / Self Care | Payer: Medicare PPO | Attending: Family Medicine | Admitting: Family Medicine

## 2021-02-17 DIAGNOSIS — S89301A Unspecified physeal fracture of lower end of right fibula, initial encounter for closed fracture: Secondary | ICD-10-CM

## 2021-02-17 DIAGNOSIS — S82831A Other fracture of upper and lower end of right fibula, initial encounter for closed fracture: Secondary | ICD-10-CM | POA: Diagnosis not present

## 2021-02-17 DIAGNOSIS — S82431A Displaced oblique fracture of shaft of right fibula, initial encounter for closed fracture: Secondary | ICD-10-CM | POA: Diagnosis not present

## 2021-02-17 DIAGNOSIS — W19XXXA Unspecified fall, initial encounter: Secondary | ICD-10-CM

## 2021-02-17 DIAGNOSIS — M7989 Other specified soft tissue disorders: Secondary | ICD-10-CM | POA: Diagnosis not present

## 2021-02-17 MED ORDER — TRAMADOL HCL 50 MG PO TABS: 50.0000 mg | ORAL_TABLET | Freq: Four times a day (QID) | ORAL | 0 refills | Status: DC | PRN

## 2021-02-17 NOTE — ED Triage Notes (Signed)
Pt is present today with right foot pain from a fall that happened yesterday. Pt states that she slipped and fell due to some ice on her ramp. Pt does have visible swelling and discoloration. Pt denies hitting her head or LOC.

## 2021-02-17 NOTE — Discharge Instructions (Addendum)
Tramadol 50 mg 1 every 6 hours as needed for pain.  Call the orthopedics office tomorrow

## 2021-02-17 NOTE — Progress Notes (Signed)
Orthopedic Tech Progress Note Patient Details:  BRYSSA TONES 05-01-1950 [id number]  Ortho Devices Type of Ortho Device: Stirrup splint, Short leg splint, Cotton web roll Ortho Device/Splint Location: RLE Ortho Device/Splint Interventions: Ordered, Application, Adjustment   Post Interventions Patient Tolerated: Well Instructions Provided: Care of device  [name] 02/17/2021, 5:26 PM

## 2021-02-17 NOTE — ED Provider Notes (Signed)
[facility]    CSN: [id number] Arrival date & time: 02/17/21  1422      History   Chief Complaint Chief Complaint  Patient presents with   Fall   Finger Injury    HPI Darlene Mclaughlin is a 70 y.o. female.    Fall  Here for right ankle pain. Began yesterday evening after a fall. She was walking down her ramp, and realized she was sliding on ice under the mat on the ramp, and twisted her right ankle. Has hurt to walk on, and is swelling there.  PMH: DM, asthma, breast cancer.  Allergic to Naproxen (mouth and tongue swelling), plus others  Past Medical History:  Diagnosis Date   ALLERGIC RHINITIS 05/15/2008   Anemia    ASTHMA 05/15/2008   Asthma    Blood transfusion without reported diagnosis 1991   anemia   Breast cancer ([place]) 2016   right breast   Cancer ([place])    DCIS right breast   Diabetes mellitus without complication ([place])    GERD (gastroesophageal reflux disease)    Headache(784.0) 05/15/2008   HYPERTENSION 05/15/2008   IMPAIRED GLUCOSE TOLERANCE 05/15/2008   Obesity    OSTEOARTHRITIS 05/15/2008   back    Patient Active Problem List   Diagnosis Date Noted   Lumbar radiculopathy 05/21/2020   Statin intolerance 01/16/2018   Bilateral leg edema 12/21/2017   History of colonic polyps 10/18/2017   Dyslipidemia 10/18/2017   Essential hypertension 03/26/2016   Family history of colon cancer 03/26/2016   Osteopenia 08/28/2015   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   Spinal stenosis of lumbar region 12/05/2013   Morbid obesity ([place]) 12/19/2012   Diabetes mellitus with coincident hypertension ([place]) 05/15/2008   Allergic rhinitis 05/15/2008   Asthma, mild intermittent 05/15/2008   Osteoarthritis 05/15/2008   HEADACHE 05/15/2008    Past Surgical History:  Procedure Laterality Date   ABDOMINAL HYSTERECTOMY     ANTERIOR LAT LUMBAR FUSION Left 05/21/2020   Procedure: LUMBAR TWO-THREE, LUMBAR THREE-FOUR DIRECT LATERAL LUMBAR  INTERBODY FUSION;  Surgeon: Vallarie Mare, MD;  Location: [facility];  Service: Neurosurgery;  Laterality: Left;   APPLICATION OF ROBOTIC ASSISTANCE FOR SPINAL PROCEDURE N/A 05/21/2020   Procedure: APPLICATION OF ROBOTIC ASSISTANCE FOR SPINAL PROCEDURE;  Surgeon: Vallarie Mare, MD;  Location: [facility];  Service: Neurosurgery;  Laterality: N/A;   BREAST LUMPECTOMY Right 12/17/2014   BREAST LUMPECTOMY WITH RADIOACTIVE SEED LOCALIZATION Right 12/17/2014   Procedure: RIGHT BREAST RADIOACTIVE SEED GUIDED PARTIAL MASTECTOMY;  Surgeon: Erroll Luna, MD;  Location: [facility];  Service: General;  Laterality: Right;   CESAREAN SECTION     KNEE SURGERY     arthroscopic left   TRANSFORAMINAL LUMBAR INTERBODY FUSION W/ MIS 2 LEVEL Right 05/21/2020   Procedure: LUMBAR FOUR-FIVE, LUMBAR FIVE-SACRAL ONE MINIMALLY INVASIVE TRANSFORAMINAL LUMBAR INTERBODY FUSION WITH INSTRUMENTATION LUMBAR TWO-SACRAL ONE;  Surgeon: Vallarie Mare, MD;  Location: [facility];  Service: Neurosurgery;  Laterality: Right;    OB History     Gravida  2   Para  2   Term      Preterm      AB      Living         SAB      IAB      Ectopic      Multiple      Live Births           Obstetric Comments  She menarched at early age of 23 and went to menopause at age 7 (hysrectomy)   She had 2 pregnancy and 2 children, her first child was born at age 28 She did not breast-fed her child.   She received birth control pills for approximately 5 years   Sh e was never exposed to fertility medications or hormone replacement therapy.   She has family history of Breast cancer (mother)           Home Medications    Prior to Admission medications   Medication Sig Start Date End Date Taking? Authorizing Provider  Accu-Chek FastClix Lancets MISC 1 EACH BY DOES NOT APPLY ROUTE DAILY. USE FOR ACCU-CHEK GUIDE 10/16/19   Isaac Bliss, Rayford Halsted, MD  ACCU-CHEK GUIDE test strip 1 EACH BY OTHER ROUTE DAILY.  09/23/20   Isaac Bliss, Rayford Halsted, MD  acetaminophen (TYLENOL) 650 MG CR tablet Take 650 mg by mouth every 8 (eight) hours as needed for pain.    [provider]  aspirin 81 MG tablet Take 81 mg by mouth daily.    [provider]  azelastine (OPTIVAR) 0.05 % ophthalmic solution Place 1 drop into both eyes daily as needed (allergies).    [provider]  blood glucose meter kit and supplies KIT Dispense based on patient and insurance preference. Use up to twice daily as directed. 01/16/18   Caren Macadam, MD  CALCIUM PO Take 1,000 mg by mouth daily.    [provider]  cetirizine (ZYRTEC) 10 MG tablet Take 10 mg by mouth daily as needed for allergies.    [provider]  chlorthalidone (HYGROTON) 25 MG tablet TAKE 1 TABLET BY MOUTH EVERY DAY 01/02/21   Isaac Bliss, Rayford Halsted, MD  cyclobenzaprine (FLEXERIL) 10 MG tablet Take 1 tablet (10 mg total) by mouth 3 (three) times daily as needed for muscle spasms. 05/26/20   Vallarie Mare, MD  EPIPEN 2-PAK 0.3 MG/0.3ML SOAJ injection Inject 0.3 mg into the muscle as needed for anaphylaxis. 06/26/14   [provider]  fluticasone (FLONASE) 50 MCG/ACT nasal spray Place 1 spray into both nostrils daily. Patient taking differently: Place 1 spray into both nostrils daily as needed for allergies. 07/30/14   Marletta Lor, MD  KLOR-CON M20 20 MEQ tablet TAKE 1 TABLET BY MOUTH TWICE A DAY 12/22/20   Isaac Bliss, Rayford Halsted, MD  Melatonin 10 MG TABS Take 10 mg by mouth at bedtime as needed (sleep).    [provider]  metFORMIN (GLUCOPHAGE-XR) 500 MG 24 hr tablet TAKE 2 TABLETS (1,000 MG TOTAL) BY MOUTH DAILY WITH SUPPER. 12/18/20   Isaac Bliss, Rayford Halsted, MD  Multiple Vitamin (MULTIVITAMIN) tablet Take 1 tablet by mouth daily.    [provider]  NON FORMULARY EVERY OTHER WEEK ALLERGY INJECTIONS    [provider]  PROAIR HFA 108 (90 BASE) MCG/ACT inhaler Inhale  1 puff into the lungs every 6 (six) hours as needed for shortness of breath or wheezing. 06/26/14   [provider]  Simethicone (GAS-X PO) Take 1 tablet by mouth daily as needed (gas).    [provider]    Family History Family History  Problem Relation Age of Onset   Diabetes Mother    Hypertension Mother    Breast cancer Mother 46       Breast cancer twice    Hyperlipidemia Father    Colon cancer Brother        11 th  brother   Diabetes Brother    Pancreatic cancer Brother        middle brother   Stomach cancer Neg Hx     Social History Social History   Tobacco Use   Smoking status: Never   Smokeless tobacco: Never  Vaping Use   Vaping Use: Never used  Substance Use Topics   Alcohol use: Yes    Comment: RARELY   Drug use: No     Allergies   Atorvastatin, Hydrochlorothiazide, Macrobid [nitrofurantoin monohyd macro], Naproxen, Simvastatin, and Zanaflex [tizanidine hcl]   Review of Systems Review of Systems   Physical Exam Triage Vital Signs ED Triage Vitals  Enc Vitals Group     BP 02/17/21 1603 122/66     Pulse Rate 02/17/21 1603 (!) 101     Resp 02/17/21 1603 18     Temp 02/17/21 1603 98.2 F (36.8 C)     Temp Source 02/17/21 1603 Oral     SpO2 02/17/21 1603 100 %     Weight --      Height --      Head Circumference --      Peak Flow --      Pain Score 02/17/21 1602 10     Pain Loc --      Pain Edu? --      Excl. in [place]? --    No data found.  Updated Vital Signs BP 122/66    Pulse (!) 101    Temp 98.2 F (36.8 C) (Oral)    Resp 18    SpO2 100%   Visual Acuity Right Eye Distance:   Left Eye Distance:   Bilateral Distance:    Right Eye Near:   Left Eye Near:    Bilateral Near:     Physical Exam Vitals reviewed.  Constitutional:      General: She is not in acute distress. Musculoskeletal:        General: Tenderness (right ankle, medial and lateral) present. No deformity.     Right lower leg: Edema (right ankle, with  ecchymosis) present.  Skin:    Coloration: Skin is not pale.  Neurological:     General: No focal deficit present.     Mental Status: She is alert and oriented to person, place, and time.     UC Treatments / Results  Labs (all labs ordered are listed, but only abnormal results are displayed) Labs Reviewed - No data to display  EKG   Radiology DG Ankle Complete Right  Result Date: 02/17/2021 CLINICAL DATA:  Trauma, fall EXAM: RIGHT ANKLE - COMPLETE 3+ VIEW COMPARISON:  None. FINDINGS: Oblique fracture is seen in the distal shaft of fibula. There is minimal distraction of the fracture fragments. Bony spurs are noted at the tip of medial malleolus. There is soft tissue swelling around the ankle. Plantar spur is seen in calcaneus. IMPRESSION: Oblique, minimally displaced fracture is seen in the distal shaft of right fibula. Plantar spur is seen in calcaneus. Electronically Signed   By: Elmer Picker M.D.   On: 02/17/2021 16:41   DG Foot Complete Right  Result Date: 02/17/2021 CLINICAL DATA:  Trauma, fall EXAM: RIGHT FOOT COMPLETE - 3+ VIEW COMPARISON:  None. FINDINGS: Oblique fracture is seen in the distal shaft of fibula. No definite fracture lines are seen in the foot. Degenerative changes are noted with bony spurs in first metatarsophalangeal joint. There is moderate sized plantar spur in the calcaneus. Bony spurs are noted in the intertarsal  and tarsometatarsal joints in the lateral view, particularly prominent in the talonavicular joint. IMPRESSION: Oblique fracture is seen in the distal shaft of fibula. No fracture or dislocation is seen in right foot. Degenerative changes are noted in multiple joints as described in the body of the report. Plantar spur is seen in calcaneus. Electronically Signed   By: Elmer Picker M.D.   On: 02/17/2021 16:44    Procedures Procedures (including critical care time)  Medications Ordered in UC Medications - No data to display  Initial  Impression / Assessment and Plan / UC Course  I have reviewed the triage vital signs and the nursing notes.  Pertinent labs & imaging results that were available during my care of the patient were reviewed by me and considered in my medical decision making (see chart for details).     There is a fracture in the fibula. Foot nl (pt requested we xray that also). Short leg splint will be applied. Tramadol for pain. OK on pmp, nothing since 05/2020. eRx did not work, so Hydrologist. Final Clinical Impressions(s) / UC Diagnoses   Final diagnoses:  Closed nondisplaced fracture of lateral malleolus of right fibula, initial encounter   Discharge Instructions   None    ED Prescriptions   None    PDMP not reviewed this encounter.   Barrett Henle, MD 02/17/21 [phone]

## 2021-02-20 DIAGNOSIS — S8261XA Displaced fracture of lateral malleolus of right fibula, initial encounter for closed fracture: Secondary | ICD-10-CM | POA: Insufficient documentation

## 2021-02-20 DIAGNOSIS — M25571 Pain in right ankle and joints of right foot: Secondary | ICD-10-CM | POA: Diagnosis not present

## 2021-02-26 DIAGNOSIS — J301 Allergic rhinitis due to pollen: Secondary | ICD-10-CM | POA: Diagnosis not present

## 2021-02-26 DIAGNOSIS — J3089 Other allergic rhinitis: Secondary | ICD-10-CM | POA: Diagnosis not present

## 2021-03-05 DIAGNOSIS — J3089 Other allergic rhinitis: Secondary | ICD-10-CM | POA: Diagnosis not present

## 2021-03-05 DIAGNOSIS — J301 Allergic rhinitis due to pollen: Secondary | ICD-10-CM | POA: Diagnosis not present

## 2021-03-11 DIAGNOSIS — J301 Allergic rhinitis due to pollen: Secondary | ICD-10-CM | POA: Diagnosis not present

## 2021-03-11 DIAGNOSIS — J3089 Other allergic rhinitis: Secondary | ICD-10-CM | POA: Diagnosis not present

## 2021-03-12 ENCOUNTER — Other Ambulatory Visit: Payer: Self-pay | Admitting: Internal Medicine

## 2021-03-13 DIAGNOSIS — S8261XA Displaced fracture of lateral malleolus of right fibula, initial encounter for closed fracture: Secondary | ICD-10-CM | POA: Diagnosis not present

## 2021-03-18 ENCOUNTER — Other Ambulatory Visit: Payer: Medicare PPO

## 2021-03-19 DIAGNOSIS — J3089 Other allergic rhinitis: Secondary | ICD-10-CM | POA: Diagnosis not present

## 2021-03-19 DIAGNOSIS — J301 Allergic rhinitis due to pollen: Secondary | ICD-10-CM | POA: Diagnosis not present

## 2021-03-30 DIAGNOSIS — M5416 Radiculopathy, lumbar region: Secondary | ICD-10-CM | POA: Diagnosis not present

## 2021-04-02 ENCOUNTER — Encounter: Payer: Self-pay | Admitting: Internal Medicine

## 2021-04-02 ENCOUNTER — Ambulatory Visit: Payer: Medicare PPO | Admitting: Internal Medicine

## 2021-04-02 VITALS — BP 130/80 | HR 94 | Temp 98.5°F

## 2021-04-02 DIAGNOSIS — E785 Hyperlipidemia, unspecified: Secondary | ICD-10-CM | POA: Diagnosis not present

## 2021-04-02 DIAGNOSIS — E119 Type 2 diabetes mellitus without complications: Secondary | ICD-10-CM | POA: Diagnosis not present

## 2021-04-02 DIAGNOSIS — I10 Essential (primary) hypertension: Secondary | ICD-10-CM

## 2021-04-02 DIAGNOSIS — J3089 Other allergic rhinitis: Secondary | ICD-10-CM | POA: Diagnosis not present

## 2021-04-02 DIAGNOSIS — J301 Allergic rhinitis due to pollen: Secondary | ICD-10-CM | POA: Diagnosis not present

## 2021-04-02 LAB — POCT GLYCOSYLATED HEMOGLOBIN (HGB A1C): Hemoglobin A1C: 6.6 % — AB (ref 4.0–5.6)

## 2021-04-02 MED ORDER — ACCU-CHEK GUIDE W/DEVICE KIT: 1.0000 | PACK | Freq: Every day | 1 refills | Status: DC

## 2021-04-02 NOTE — Patient Instructions (Signed)
-  Nice seeing you today!!  -See you back in 3 months. 

## 2021-04-02 NOTE — Progress Notes (Signed)
Established Patient Office Visit     This visit occurred during the SARS-CoV-2 public health emergency.  Safety protocols were in place, including screening questions prior to the visit, additional usage of staff PPE, and extensive cleaning of exam room while observing appropriate contact time as indicated for disinfecting solutions.    CC/Reason for Visit: 43-monthfollow-up chronic conditions  HPI: Darlene SPIEWAKis a 71y.o. female who is coming in today for the above mentioned reasons. Past Medical History is significant for: Hypertension, hyperlipidemia, type 2 diabetes, morbid obesity.  The day after Christmas she slipped on ice on her deck and fractured her right fibula.  She is in a soft cast for about 12 weeks.  She has no acute concerns or complaints.   Past Medical/Surgical History: Past Medical History:  Diagnosis Date   ALLERGIC RHINITIS 05/15/2008   Anemia    ASTHMA 05/15/2008   Asthma    Blood transfusion without reported diagnosis 1991   anemia   [place] [place]   right breast   Cancer ([facility]    DCIS right breast   Diabetes mellitus without complication ([facility]    GERD (gastroesophageal reflux disease)    Headache(784.0) 05/15/2008   HYPERTENSION 05/15/2008   IMPAIRED GLUCOSE TOLERANCE 05/15/2008   Obesity    OSTEOARTHRITIS 05/15/2008   back    Past Surgical History:  Procedure Laterality Date   ABDOMINAL HYSTERECTOMY     ANTERIOR LAT LUMBAR FUSION Left 05/21/2020   Procedure: LUMBAR TWO-THREE, LUMBAR THREE-FOUR DIRECT LATERAL LUMBAR INTERBODY FUSION;  Surgeon: TVallarie Mare MD;  [place] [facility]  Service: Neurosurgery;  Laterality: Left;   APPLICATION OF ROBOTIC ASSISTANCE FOR SPINAL PROCEDURE N/A 05/21/2020   Procedure: APPLICATION OF ROBOTIC ASSISTANCE FOR SPINAL PROCEDURE;  Surgeon: TVallarie Mare MD;  [place] [facility]  Service: Neurosurgery;  Laterality: N/A;   BREAST LUMPECTOMY Right 12/17/2014   BREAST LUMPECTOMY WITH RADIOACTIVE  SEED LOCALIZATION Right 12/17/2014   Procedure: RIGHT BREAST RADIOACTIVE SEED GUIDED PARTIAL MASTECTOMY;  Surgeon: TErroll Luna MD;  [place] [facility]  Service: General;  Laterality: Right;   CESAREAN SECTION     KNEE SURGERY     arthroscopic left   TRANSFORAMINAL LUMBAR INTERBODY FUSION W/ MIS 2 LEVEL Right 05/21/2020   Procedure: LUMBAR FOUR-FIVE, LUMBAR FIVE-SACRAL ONE MINIMALLY INVASIVE TRANSFORAMINAL LUMBAR INTERBODY FUSION WITH INSTRUMENTATION LUMBAR TWO-SACRAL ONE;  Surgeon: TVallarie Mare MD;  [place] [facility]  Service: Neurosurgery;  Laterality: Right;    Social History:  reports that she has never smoked. She has never used smokeless tobacco. She reports current alcohol use. She reports that she does not use drugs.  Allergies: Allergies  Allergen Reactions   Atorvastatin     Hallucinations    Hydrochlorothiazide     Muscle pain   Macrobid [Nitrofurantoin Monohyd Macro]      Liver problems   Naproxen Swelling    Mouth and tongue swelling   Simvastatin     Hallucinations, muscle pains   Zanaflex [Tizanidine Hcl] Other (See Comments)     Liver problems    Family History:  Family History  Problem Relation Age of Onset   Diabetes Mother    Hypertension Mother    Breast cancer Mother 751      Breast cancer twice    Hyperlipidemia Father    Colon cancer Brother        47th brother   Diabetes Brother    Pancreatic cancer  Brother        middle brother   Stomach cancer Neg Hx      Current Outpatient Medications:    Accu-Chek FastClix Lancets MISC, 1 EACH BY DOES NOT APPLY ROUTE DAILY. USE FOR ACCU-CHEK GUIDE, Disp: 102 each, Rfl: 3   ACCU-CHEK GUIDE test strip, USE AS DIRECTED, Disp: 100 strip, Rfl: 3   acetaminophen (TYLENOL) 650 MG CR tablet, Take 650 mg by mouth every 8 (eight) hours as needed for pain., Disp: , Rfl:    aspirin 81 MG tablet, Take 81 mg by mouth daily., Disp: , Rfl:    azelastine (OPTIVAR) 0.05 % ophthalmic solution,  Place 1 drop into both eyes daily as needed (allergies)., Disp: , Rfl:    Blood Glucose Monitoring Suppl (ACCU-CHEK GUIDE) w/Device KIT, 1 each by Does not apply route daily., Disp: 1 kit, Rfl: 1   CALCIUM PO, Take 1,000 mg by mouth daily., Disp: , Rfl:    cetirizine (ZYRTEC) 10 MG tablet, Take 10 mg by mouth daily as needed for allergies., Disp: , Rfl:    chlorthalidone (HYGROTON) 25 MG tablet, TAKE 1 TABLET BY MOUTH EVERY DAY, Disp: 90 tablet, Rfl: 1   cyclobenzaprine (FLEXERIL) 10 MG tablet, Take 1 tablet (10 mg total) by mouth 3 (three) times daily as needed for muscle spasms., Disp: 60 tablet, Rfl: 0   EPIPEN 2-PAK 0.3 MG/0.3ML SOAJ injection, Inject 0.3 mg into the muscle as needed for anaphylaxis., Disp: , Rfl: 1   fluticasone (FLONASE) 50 MCG/ACT nasal spray, Place 1 spray into both nostrils daily. (Patient taking differently: Place 1 spray into both nostrils daily as needed for allergies.), Disp: 16 g, Rfl: 5   KLOR-CON M20 20 MEQ tablet, TAKE 1 TABLET BY MOUTH TWICE A DAY, Disp: 180 tablet, Rfl: 1   Melatonin 10 MG TABS, Take 10 mg by mouth at bedtime as needed (sleep)., Disp: , Rfl:    metFORMIN (GLUCOPHAGE-XR) 500 MG 24 hr tablet, Take 2 tablets (1,000 mg total) by mouth daily with supper., Disp: 180 tablet, Rfl: 1   Multiple Vitamin (MULTIVITAMIN) tablet, Take 1 tablet by mouth daily., Disp: , Rfl:    NON FORMULARY, EVERY OTHER WEEK ALLERGY INJECTIONS, Disp: , Rfl:    PROAIR HFA 108 (90 BASE) MCG/ACT inhaler, Inhale 1 puff into the lungs every 6 (six) hours as needed for shortness of breath or wheezing., Disp: , Rfl: 0   Simethicone (GAS-X PO), Take 1 tablet by mouth daily as needed (gas)., Disp: , Rfl:    traMADol (ULTRAM) 50 MG tablet, Take 1 tablet (50 mg total) by mouth every 6 (six) hours as needed., Disp: 20 tablet, Rfl: 0  Review of Systems:  Constitutional: Denies fever, chills, diaphoresis, appetite change and fatigue.  HEENT: Denies photophobia, eye pain, redness, hearing  loss, ear pain, congestion, sore throat, rhinorrhea, sneezing, mouth sores, trouble swallowing, neck pain, neck stiffness and tinnitus.   Respiratory: Denies SOB, DOE, cough, chest tightness,  and wheezing.   Cardiovascular: Denies chest pain, palpitations and leg swelling.  Gastrointestinal: Denies nausea, vomiting, abdominal pain, diarrhea, constipation, blood in stool and abdominal distention.  Genitourinary: Denies dysuria, urgency, frequency, hematuria, flank pain and difficulty urinating.  Endocrine: Denies: hot or cold intolerance, sweats, changes in hair or nails, polyuria, polydipsia. Musculoskeletal: Denies myalgias, back pain, joint swelling, arthralgias and gait problem.  Skin: Denies pallor, rash and wound.  Neurological: Denies dizziness, seizures, syncope, weakness, light-headedness, numbness and headaches.  Hematological: Denies adenopathy. Easy bruising, personal or family  bleeding history  Psychiatric/Behavioral: Denies suicidal ideation, mood changes, confusion, nervousness, sleep disturbance and agitation    Physical Exam: Vitals:   04/02/21 0927  BP: 130/80  Pulse: 94  Temp: 98.5 F (36.9 C)  TempSrc: Oral  SpO2: 98%    There is no height or weight on file to calculate BMI.   Constitutional: NAD, calm, comfortable, obese Eyes: PERRL, lids and conjunctivae normal, wears corrective lenses ENMT: Mucous membranes are moist.  Respiratory: clear to auscultation bilaterally, no wheezing, no crackles. Normal respiratory effort. No accessory muscle use.  Cardiovascular: Regular rate and rhythm, no murmurs / rubs / gallops. No extremity edema.  Psychiatric: Normal judgment and insight. Alert and oriented x 3. Normal mood.    Impression and Plan:  Essential hypertension -Fair control.  Dyslipidemia -LDL is above goal but she has a statin intolerance.  She had an initial consultation with Dr. Debara Pickett but needs to return.  Diabetes mellitus with coincident  hypertension (HCC) -A1c demonstrates good control at 6.6.  Morbid obesity ([place]) -Discussed healthy lifestyle, including increased physical activity and better food choices to promote weight loss.   Time spent: 31 minutes reviewing chart, interviewing and examining patient and formulating plan of care   Patient Instructions  -Nice seeing you today!!  -See you back in 3 months.    Lelon Frohlich, MD [facility] at [facility]

## 2021-04-13 DIAGNOSIS — J3089 Other allergic rhinitis: Secondary | ICD-10-CM | POA: Diagnosis not present

## 2021-04-13 DIAGNOSIS — J301 Allergic rhinitis due to pollen: Secondary | ICD-10-CM | POA: Diagnosis not present

## 2021-04-13 DIAGNOSIS — S8261XA Displaced fracture of lateral malleolus of right fibula, initial encounter for closed fracture: Secondary | ICD-10-CM | POA: Diagnosis not present

## 2021-04-24 DIAGNOSIS — J3089 Other allergic rhinitis: Secondary | ICD-10-CM | POA: Diagnosis not present

## 2021-04-24 DIAGNOSIS — J301 Allergic rhinitis due to pollen: Secondary | ICD-10-CM | POA: Diagnosis not present

## 2021-04-24 DIAGNOSIS — S8261XD Displaced fracture of lateral malleolus of right fibula, subsequent encounter for closed fracture with routine healing: Secondary | ICD-10-CM | POA: Diagnosis not present

## 2021-04-27 ENCOUNTER — Other Ambulatory Visit: Payer: Self-pay | Admitting: Internal Medicine

## 2021-04-28 ENCOUNTER — Ambulatory Visit: Payer: Medicare PPO | Admitting: Physical Therapy

## 2021-04-30 DIAGNOSIS — S8261XD Displaced fracture of lateral malleolus of right fibula, subsequent encounter for closed fracture with routine healing: Secondary | ICD-10-CM | POA: Diagnosis not present

## 2021-05-04 ENCOUNTER — Other Ambulatory Visit: Payer: Self-pay | Admitting: Internal Medicine

## 2021-05-05 DIAGNOSIS — J3089 Other allergic rhinitis: Secondary | ICD-10-CM | POA: Diagnosis not present

## 2021-05-05 DIAGNOSIS — J301 Allergic rhinitis due to pollen: Secondary | ICD-10-CM | POA: Diagnosis not present

## 2021-05-05 DIAGNOSIS — S8261XD Displaced fracture of lateral malleolus of right fibula, subsequent encounter for closed fracture with routine healing: Secondary | ICD-10-CM | POA: Diagnosis not present

## 2021-05-07 DIAGNOSIS — S8261XD Displaced fracture of lateral malleolus of right fibula, subsequent encounter for closed fracture with routine healing: Secondary | ICD-10-CM | POA: Diagnosis not present

## 2021-05-12 DIAGNOSIS — J301 Allergic rhinitis due to pollen: Secondary | ICD-10-CM | POA: Diagnosis not present

## 2021-05-12 DIAGNOSIS — J3089 Other allergic rhinitis: Secondary | ICD-10-CM | POA: Diagnosis not present

## 2021-05-12 DIAGNOSIS — S8261XD Displaced fracture of lateral malleolus of right fibula, subsequent encounter for closed fracture with routine healing: Secondary | ICD-10-CM | POA: Diagnosis not present

## 2021-05-14 DIAGNOSIS — S8261XD Displaced fracture of lateral malleolus of right fibula, subsequent encounter for closed fracture with routine healing: Secondary | ICD-10-CM | POA: Diagnosis not present

## 2021-05-19 DIAGNOSIS — S8261XD Displaced fracture of lateral malleolus of right fibula, subsequent encounter for closed fracture with routine healing: Secondary | ICD-10-CM | POA: Diagnosis not present

## 2021-05-21 DIAGNOSIS — S8261XD Displaced fracture of lateral malleolus of right fibula, subsequent encounter for closed fracture with routine healing: Secondary | ICD-10-CM | POA: Diagnosis not present

## 2021-05-22 DIAGNOSIS — M25571 Pain in right ankle and joints of right foot: Secondary | ICD-10-CM | POA: Diagnosis not present

## 2021-05-22 DIAGNOSIS — S8261XD Displaced fracture of lateral malleolus of right fibula, subsequent encounter for closed fracture with routine healing: Secondary | ICD-10-CM | POA: Diagnosis not present

## 2021-05-28 DIAGNOSIS — J301 Allergic rhinitis due to pollen: Secondary | ICD-10-CM | POA: Diagnosis not present

## 2021-05-28 DIAGNOSIS — J3089 Other allergic rhinitis: Secondary | ICD-10-CM | POA: Diagnosis not present

## 2021-06-10 DIAGNOSIS — J301 Allergic rhinitis due to pollen: Secondary | ICD-10-CM | POA: Diagnosis not present

## 2021-06-10 DIAGNOSIS — J3089 Other allergic rhinitis: Secondary | ICD-10-CM | POA: Diagnosis not present

## 2021-06-11 DIAGNOSIS — J3089 Other allergic rhinitis: Secondary | ICD-10-CM | POA: Diagnosis not present

## 2021-06-11 DIAGNOSIS — J301 Allergic rhinitis due to pollen: Secondary | ICD-10-CM | POA: Diagnosis not present

## 2021-06-24 DIAGNOSIS — J3089 Other allergic rhinitis: Secondary | ICD-10-CM | POA: Diagnosis not present

## 2021-06-24 DIAGNOSIS — J301 Allergic rhinitis due to pollen: Secondary | ICD-10-CM | POA: Diagnosis not present

## 2021-06-29 DIAGNOSIS — Z6841 Body Mass Index (BMI) 40.0 and over, adult: Secondary | ICD-10-CM | POA: Diagnosis not present

## 2021-06-29 DIAGNOSIS — M5416 Radiculopathy, lumbar region: Secondary | ICD-10-CM | POA: Diagnosis not present

## 2021-06-29 DIAGNOSIS — M48061 Spinal stenosis, lumbar region without neurogenic claudication: Secondary | ICD-10-CM | POA: Diagnosis not present

## 2021-06-30 ENCOUNTER — Encounter: Payer: Self-pay | Admitting: Internal Medicine

## 2021-06-30 ENCOUNTER — Ambulatory Visit: Payer: Medicare PPO | Admitting: Internal Medicine

## 2021-06-30 VITALS — BP 120/64 | HR 95 | Temp 98.0°F | Ht 64.0 in | Wt 247.8 lb

## 2021-06-30 DIAGNOSIS — E785 Hyperlipidemia, unspecified: Secondary | ICD-10-CM

## 2021-06-30 DIAGNOSIS — E119 Type 2 diabetes mellitus without complications: Secondary | ICD-10-CM

## 2021-06-30 DIAGNOSIS — L600 Ingrowing nail: Secondary | ICD-10-CM

## 2021-06-30 DIAGNOSIS — I10 Essential (primary) hypertension: Secondary | ICD-10-CM | POA: Diagnosis not present

## 2021-06-30 DIAGNOSIS — Z789 Other specified health status: Secondary | ICD-10-CM

## 2021-06-30 LAB — POCT GLYCOSYLATED HEMOGLOBIN (HGB A1C): Hemoglobin A1C: 6.6 % — AB (ref 4.0–5.6)

## 2021-06-30 LAB — MICROALBUMIN / CREATININE URINE RATIO
Creatinine,U: 72.3 mg/dL
Microalb Creat Ratio: 1 mg/g (ref 0.0–30.0)
Microalb, Ur: <0.7 mg/dL (ref 0.0–1.9)

## 2021-06-30 NOTE — Progress Notes (Signed)
? ? ? ?Established Patient Office Visit ? ? ? ? ?CC/Reason for Visit: 80-monthfollow-up chronic medical conditions ? ?HPI: Darlene BENTLEYis a 71y.o. female who is coming in today for the above mentioned reasons. Past Medical History is significant for: Hypertension, hyperlipidemia, type 2 diabetes, morbid obesity.  She has a history of spinal stenosis and had surgery in 2022.  She had fractured her right ankle in December and is now out of the boot and has been released from orthopedic care.  She is concerned about her left big toenail which is ingrown and is causing some pain and bleeding. ? ? ?Past Medical/Surgical History: ?Past Medical History:  ?Diagnosis Date  ? ALLERGIC RHINITIS 05/15/2008  ? Anemia   ? ASTHMA 05/15/2008  ? Asthma   ? Blood transfusion without reported diagnosis 1991  ? anemia  ? [place] [place]  ? [place] breast  ? Cancer ([facility]   ? DCIS right breast  ? Diabetes mellitus without complication ([facility]   ? GERD (gastroesophageal reflux disease)   ? Headache(784.0) 05/15/2008  ? HYPERTENSION 05/15/2008  ? IMPAIRED GLUCOSE TOLERANCE 05/15/2008  ? Obesity   ? OSTEOARTHRITIS 05/15/2008  ? back  ? ? ?Past Surgical History:  ?Procedure Laterality Date  ? ABDOMINAL HYSTERECTOMY    ? ANTERIOR LAT LUMBAR FUSION Left 05/21/2020  ? Procedure: LUMBAR TWO-THREE, LUMBAR THREE-FOUR DIRECT LATERAL LUMBAR INTERBODY FUSION;  Surgeon: TVallarie Mare MD;  [place] [facility]  Service: Neurosurgery;  Laterality: Left;  ? APPLICATION OF ROBOTIC ASSISTANCE FOR SPINAL PROCEDURE N/A 05/21/2020  ? Procedure: APPLICATION OF ROBOTIC ASSISTANCE FOR SPINAL PROCEDURE;  Surgeon: TVallarie Mare MD;  [place] [facility] [place]  Service: Neurosurgery;  Laterality: N/A;  ? BREAST LUMPECTOMY Right 12/17/2014  ? BREAST LUMPECTOMY WITH RADIOACTIVE SEED LOCALIZATION Right 12/17/2014  ? Procedure: RIGHT BREAST RADIOACTIVE SEED GUIDED PARTIAL MASTECTOMY;  Surgeon: TErroll Luna MD;  [place] [facility]  Service:  General;  Laterality: Right;  ? CESAREAN SECTION    ? KNEE SURGERY    ? arthroscopic left  ? TRANSFORAMINAL LUMBAR INTERBODY FUSION W/ MIS 2 LEVEL Right 05/21/2020  ? Procedure: LUMBAR FOUR-FIVE, LUMBAR FIVE-SACRAL ONE MINIMALLY INVASIVE TRANSFORAMINAL LUMBAR INTERBODY FUSION WITH INSTRUMENTATION LUMBAR TWO-SACRAL ONE;  Surgeon: TVallarie Mare MD;  [place] [facility]  Service: Neurosurgery;  Laterality: Right;  ? ? ?Social History: ? reports that she has never smoked. She has never used smokeless tobacco. She reports current alcohol use. She reports that she does not use drugs. ? ?Allergies: ?Allergies  ?Allergen Reactions  ? Atorvastatin   ?  Hallucinations   ? Hydrochlorothiazide   ?  Muscle pain  ? Macrobid [W[employer] ResourcesMacro]   ?   Liver problems  ? Naproxen Swelling  ?  Mouth and tongue swelling  ? Simvastatin   ?  Hallucinations, muscle pains  ? Zanaflex [Tizanidine Hcl] Other (See Comments)  ?   Liver problems  ? ? ?Family History:  ?Family History  ?Problem Relation Age of Onset  ? Diabetes Mother   ? Hypertension Mother   ? Breast cancer Mother 750 ?     Breast cancer twice   ? Hyperlipidemia Father   ? Colon cancer Brother   ?     4 th brother  ? Diabetes Brother   ? Pancreatic cancer Brother   ?     middle brother  ? Stomach cancer Neg Hx   ? ? ? ?Current Outpatient Medications:  ?  Accu-Chek FastClix Lancets MISC, 1 EACH BY DOES NOT APPLY ROUTE DAILY. USE FOR ACCU-CHEK GUIDE, Disp: 102 each, Rfl: 3 ?  ACCU-CHEK GUIDE test strip, USE AS DIRECTED, Disp: 100 strip, Rfl: 3 ?  acetaminophen (TYLENOL) 650 MG CR tablet, Take 650 mg by mouth every 8 (eight) hours as needed for pain., Disp: , Rfl:  ?  aspirin 81 MG tablet, Take 81 mg by mouth daily., Disp: , Rfl:  ?  azelastine (OPTIVAR) 0.05 % ophthalmic solution, Place 1 drop into both eyes daily as needed (allergies)., Disp: , Rfl:  ?  Blood Glucose Monitoring Suppl (ACCU-CHEK GUIDE ME) w/Device KIT, USE TO CHECK BLOOD SUGAR ONCE DAILY, Disp: 1  kit, Rfl: 1 ?  CALCIUM PO, Take 1,000 mg by mouth daily., Disp: , Rfl:  ?  cetirizine (ZYRTEC) 10 MG tablet, Take 10 mg by mouth daily as needed for allergies., Disp: , Rfl:  ?  chlorthalidone (HYGROTON) 25 MG tablet, TAKE 1 TABLET BY MOUTH EVERY DAY, Disp: 90 tablet, Rfl: 1 ?  cyclobenzaprine (FLEXERIL) 10 MG tablet, Take 1 tablet (10 mg total) by mouth 3 (three) times daily as needed for muscle spasms., Disp: 60 tablet, Rfl: 0 ?  EPIPEN 2-PAK 0.3 MG/0.3ML SOAJ injection, Inject 0.3 mg into the muscle as needed for anaphylaxis., Disp: , Rfl: 1 ?  fluticasone (FLONASE) 50 MCG/ACT nasal spray, Place 1 spray into both nostrils daily. (Patient taking differently: Place 1 spray into both nostrils daily as needed for allergies.), Disp: 16 g, Rfl: 5 ?  KLOR-CON M20 20 MEQ tablet, TAKE 1 TABLET BY MOUTH TWICE A DAY, Disp: 180 tablet, Rfl: 1 ?  Melatonin 10 MG TABS, Take 10 mg by mouth at bedtime as needed (sleep)., Disp: , Rfl:  ?  metFORMIN (GLUCOPHAGE-XR) 500 MG 24 hr tablet, Take 2 tablets (1,000 mg total) by mouth daily with supper., Disp: 180 tablet, Rfl: 1 ?  Multiple Vitamin (MULTIVITAMIN) tablet, Take 1 tablet by mouth daily., Disp: , Rfl:  ?  NON FORMULARY, EVERY OTHER WEEK ALLERGY INJECTIONS, Disp: , Rfl:  ?  PROAIR HFA 108 (90 BASE) MCG/ACT inhaler, Inhale 1 puff into the lungs every 6 (six) hours as needed for shortness of breath or wheezing., Disp: , Rfl: 0 ?  Simethicone (GAS-X PO), Take 1 tablet by mouth daily as needed (gas)., Disp: , Rfl:  ?  traMADol (ULTRAM) 50 MG tablet, Take 1 tablet (50 mg total) by mouth every 6 (six) hours as needed., Disp: 20 tablet, Rfl: 0 ? ?Review of Systems:  ?Constitutional: Denies fever, chills, diaphoresis, appetite change and fatigue.  ?HEENT: Denies photophobia, eye pain, redness, hearing loss, ear pain, congestion, sore throat, rhinorrhea, sneezing, mouth sores, trouble swallowing, neck pain, neck stiffness and tinnitus.   ?Respiratory: Denies SOB, DOE, cough, chest  tightness,  and wheezing.   ?Cardiovascular: Denies chest pain, palpitations and leg swelling.  ?Gastrointestinal: Denies nausea, vomiting, abdominal pain, diarrhea, constipation, blood in stool and abdominal distention.  ?Genitourinary: Denies dysuria, urgency, frequency, hematuria, flank pain and difficulty urinating.  ?Endocrine: Denies: hot or cold intolerance, sweats, changes in hair or nails, polyuria, polydipsia. ?Musculoskeletal: Denies myalgias, back pain, joint swelling, arthralgias and gait problem.  ?Skin: Denies pallor, rash and wound.  ?Neurological: Denies dizziness, seizures, syncope, weakness, light-headedness, numbness and headaches.  ?Hematological: Denies adenopathy. Easy bruising, personal or family bleeding history  ?Psychiatric/Behavioral: Denies suicidal ideation, mood changes, confusion, nervousness, sleep disturbance and agitation ? ? ? ?Physical Exam: ?Vitals:  ? 06/30/21 1033  ?BP:  120/64  ?Pulse: 95  ?Temp: 98 ?F (36.7 ?C)  ?TempSrc: Oral  ?SpO2: 96%  ?Weight: 247 lb 12.8 oz (112.4 kg)  ?Height: _0  (1.626 m)  ? ? ?Body mass index is 42.53 kg/m?. ? ? ?Constitutional: NAD, calm, comfortable, obese ?Eyes: PERRL, lids and conjunctivae normal, wears corrective lenses ?ENMT: Mucous membranes are moist.  ?Respiratory: clear to auscultation bilaterally, no wheezing, no crackles. Normal respiratory effort. No accessory muscle use.  ?Cardiovascular: Regular rate and rhythm, no murmurs / rubs / gallops. No extremity edema.  ?Psychiatric: Normal judgment and insight. Alert and oriented x 3. Normal mood.  ? ? ?Impression and Plan: ? ?Diabetes mellitus with coincident hypertension ([place])  ?- Plan: POC HgB A1c, Ambulatory referral to Podiatry, Microalbumin / creatinine urine ratio ?-Well-controlled with an A1c of 6.6. ? ?Morbid obesity ([place]) ?-Discussed healthy lifestyle, including increased physical activity and better food choices to promote weight loss. ? ?Essential hypertension. ?-Blood pressure  is well controlled on chlorthalidone. ? ?Dyslipidemia ?Statin intolerance ?-Last lipid panel with an LDL of 128. ?-She is hesitant to start any nonstatin cholesterol medications. ? ?Ingrown left big toenail  ?- Pl

## 2021-06-30 NOTE — Patient Instructions (Signed)
-  Nice seeing you today!! ? ?-Make sure you have scheduled your eye exam. ? ?-Schedule follow up in 3 months. ?

## 2021-07-10 ENCOUNTER — Encounter: Payer: Self-pay | Admitting: Gastroenterology

## 2021-07-10 DIAGNOSIS — J4520 Mild intermittent asthma, uncomplicated: Secondary | ICD-10-CM | POA: Diagnosis not present

## 2021-07-10 DIAGNOSIS — J3089 Other allergic rhinitis: Secondary | ICD-10-CM | POA: Diagnosis not present

## 2021-07-10 DIAGNOSIS — H1045 Other chronic allergic conjunctivitis: Secondary | ICD-10-CM | POA: Diagnosis not present

## 2021-07-13 ENCOUNTER — Ambulatory Visit: Payer: Medicare PPO | Admitting: Podiatry

## 2021-07-13 DIAGNOSIS — L600 Ingrowing nail: Secondary | ICD-10-CM | POA: Diagnosis not present

## 2021-07-13 DIAGNOSIS — M79675 Pain in left toe(s): Secondary | ICD-10-CM | POA: Diagnosis not present

## 2021-07-13 NOTE — Patient Instructions (Signed)
Ingrown Toenail  An ingrown toenail occurs when the corner or sides of a toenail grow into the surrounding skin. This causes discomfort and pain. The big toe is most commonly affected, but any of the toes can be affected. If an ingrown toenail is not treated, it can become infected. What are the causes? This condition may be caused by: Wearing shoes that are too small or tight. An injury, such as stubbing your toe or having your toe stepped on. Improper cutting or care of your toenails. Having nail or foot abnormalities that were present from birth (congenital abnormalities), such as having a nail that is too big for your toe. What increases the risk? The following factors may make you more likely to develop ingrown toenails: Age. Nails tend to get thicker with age, so ingrown nails are more common among older people. Cutting your toenails incorrectly, such as cutting them very short or cutting them unevenly. An ingrown toenail is more likely to get infected if you have: Diabetes. Blood flow (circulation) problems. What are the signs or symptoms? Symptoms of an ingrown toenail may include: Pain, soreness, or tenderness. Redness. Swelling. Hardening of the skin that surrounds the toenail. Signs that an ingrown toenail may be infected include: Fluid or pus. Symptoms that get worse. How is this diagnosed? Ingrown toenails may be diagnosed based on: Your symptoms and medical history. A physical exam. Labs or tests. If you have fluid or blood coming from your toenail, a sample may be collected to test for the specific type of bacteria that is causing the infection. How is this treated? Treatment depends on the severity of your symptoms. You may be able to care for your toenail at home. If you have an infection, you may be prescribed antibiotic medicines. If you have fluid or pus draining from your toenail, your health care provider may drain it. If you have trouble walking, you may be  given crutches to use. If you have a severe or infected ingrown toenail, you may need a procedure to remove part or all of the nail. Follow these instructions at home: Foot care  Check your wound every day for signs of infection, or as often as told by your health care provider. Check for: More redness, swelling, or pain. More fluid or blood. Warmth. Pus or a bad smell. Do not pick at your toenail or try to remove it yourself. Soak your foot in warm, soapy water. Do this for 20 minutes, 3 times a day, or as often as told by your health care provider. This helps to keep your toe clean and your skin soft. Wear shoes that fit well and are not too tight. Your health care provider may recommend that you wear open-toed shoes while you heal. Trim your toenails regularly and carefully. Cut your toenails straight across to prevent injury to the skin at the corners of the toenail. Do not cut your nails in a curved shape. Keep your feet clean and dry to help prevent infection. General instructions Take over-the-counter and prescription medicines only as told by your health care provider. If you were prescribed an antibiotic, take it as told by your health care provider. Do not stop taking the antibiotic even if you start to feel better. If your health care provider told you to use crutches to help you move around, use them as instructed. Return to your normal activities as told by your health care provider. Ask your health care provider what activities are safe for you.   Keep all follow-up visits. This is important. Contact a health care provider if: You have more redness, swelling, pain, or other symptoms that do not improve with treatment. You have fluid, blood, or pus coming from your toenail. You have a red streak on your skin that starts at your foot and spreads up your leg. You have a fever. Summary An ingrown toenail occurs when the corner or sides of a toenail grow into the surrounding skin.  This causes discomfort and pain. The big toe is most commonly affected, but any of the toes can be affected. If an ingrown toenail is not treated, it can become infected. Fluid or pus draining from your toenail is a sign of infection. Your health care provider may need to drain it. You may be given antibiotics to treat the infection. Trimming your toenails regularly and properly can help you prevent an ingrown toenail. This information is not intended to replace advice given to you by your health care provider. Make sure you discuss any questions you have with your health care provider. Document Revised: 06/10/2020 Document Reviewed: 06/10/2020 Elsevier Patient Education  [place]. Diabetes Mellitus and [place] care is an important part of your health, especially when you have diabetes. Diabetes may cause you to have problems because of poor blood flow (circulation) to your feet and legs, which can cause your skin to: Become thinner and drier. Break more easily. Heal more slowly. Peel and crack. You may also have nerve damage (neuropathy) in your legs and feet, causing decreased feeling in them. This means that you may not notice minor injuries to your feet that could lead to more serious problems. Noticing and addressing any potential problems early is the best way to prevent future foot problems. How to care for your feet Foot hygiene  Wash your feet daily with warm water and mild soap. Do not use hot water. Then, pat your feet and the areas between your toes until they are completely dry. Do not soak your feet as this can dry your skin. Trim your toenails straight across. Do not dig under them or around the cuticle. File the edges of your nails with an emery board or nail file. Apply a moisturizing lotion or petroleum jelly to the skin on your feet and to dry, brittle toenails. Use lotion that does not contain alcohol and is unscented. Do not apply lotion between your  toes. Shoes and socks Wear clean socks or stockings every day. Make sure they are not too tight. Do not wear knee-high stockings since they may decrease blood flow to your legs. Wear shoes that fit properly and have enough cushioning. Always look in your shoes before you put them on to be sure there are no objects inside. To break in new shoes, wear them for just a few hours a day. This prevents injuries on your feet. Wounds, scrapes, corns, and calluses  Check your feet daily for blisters, cuts, bruises, sores, and redness. If you cannot see the bottom of your feet, use a mirror or ask someone for help. Do not cut corns or calluses or try to remove them with medicine. If you find a minor scrape, cut, or break in the skin on your feet, keep it and the skin around it clean and dry. You may clean these areas with mild soap and water. Do not clean the area with peroxide, alcohol, or iodine. If you have a wound, scrape, corn, or callus on your foot, look at  it several times a day to make sure it is healing and not infected. Check for: Redness, swelling, or pain. Fluid or blood. Warmth. Pus or a bad smell. General tips Do not cross your legs. This may decrease blood flow to your feet. Do not use heating pads or hot water bottles on your feet. They may burn your skin. If you have lost feeling in your feet or legs, you may not know this is happening until it is too late. Protect your feet from hot and cold by wearing shoes, such as at the beach or on hot pavement. Schedule a complete foot exam at least once a year (annually) or more often if you have foot problems. Report any cuts, sores, or bruises to your health care provider immediately. Where to find more information American Diabetes Association: www.diabetes.org Association of Diabetes Care & Education Specialists: www.diabeteseducator.org Contact a health care provider if: You have a medical condition that increases your risk of infection and  you have any cuts, sores, or bruises on your feet. You have an injury that is not healing. You have redness on your legs or feet. You feel burning or tingling in your legs or feet. You have pain or cramps in your legs and feet. Your legs or feet are numb. Your feet always feel cold. You have pain around any toenails. Get help right away if: You have a wound, scrape, corn, or callus on your foot and: You have pain, swelling, or redness that gets worse. You have fluid or blood coming from the wound, scrape, corn, or callus. Your wound, scrape, corn, or callus feels warm to the touch. You have pus or a bad smell coming from the wound, scrape, corn, or callus. You have a fever. You have a red line going up your leg. Summary Check your feet every day for blisters, cuts, bruises, sores, and redness. Apply a moisturizing lotion or petroleum jelly to the skin on your feet and to dry, brittle toenails. Wear shoes that fit properly and have enough cushioning. If you have foot problems, report any cuts, sores, or bruises to your health care provider immediately. Schedule a complete foot exam at least once a year (annually) or more often if you have foot problems. This information is not intended to replace advice given to you by your health care provider. Make sure you discuss any questions you have with your health care provider. Document Revised: 08/30/2019 Document Reviewed: 08/30/2019 Elsevier Patient Education  [place].

## 2021-07-13 NOTE — Progress Notes (Unsigned)
Subjective:   Patient ID: Darlene Mclaughlin, female   DOB: 71 y.o.   MRN: [id number]   HPI 71 year old female presents the office today for concerns of ingrown toenail left lateral nail border.  She states that she previously was going to see a podiatrist on a regular basis have her nails trimmed however has not seen them in a long time.  She said the nail has started to become ingrowing into the skin.  No swelling or redness or any drainage.  The area is tender.  She is tried soaking as well as Neosporin which typically helps but this time it is not.  A1c 6.6 on 06/30/2021  Review of Systems  All other systems reviewed and are negative.  Past Medical History:  Diagnosis Date   ALLERGIC RHINITIS 05/15/2008   Anemia    ASTHMA 05/15/2008   Asthma    Blood transfusion without reported diagnosis 1991   anemia   Breast cancer ([place]) 2016   right breast   Cancer ([place])    DCIS right breast   Diabetes mellitus without complication ([place])    GERD (gastroesophageal reflux disease)    Headache(784.0) 05/15/2008   HYPERTENSION 05/15/2008   IMPAIRED GLUCOSE TOLERANCE 05/15/2008   Obesity    OSTEOARTHRITIS 05/15/2008   back    Past Surgical History:  Procedure Laterality Date   ABDOMINAL HYSTERECTOMY     ANTERIOR LAT LUMBAR FUSION Left 05/21/2020   Procedure: LUMBAR TWO-THREE, LUMBAR THREE-FOUR DIRECT LATERAL LUMBAR INTERBODY FUSION;  Surgeon: Vallarie Mare, MD;  Location: [facility];  Service: Neurosurgery;  Laterality: Left;   APPLICATION OF ROBOTIC ASSISTANCE FOR SPINAL PROCEDURE N/A 05/21/2020   Procedure: APPLICATION OF ROBOTIC ASSISTANCE FOR SPINAL PROCEDURE;  Surgeon: Vallarie Mare, MD;  Location: [facility];  Service: Neurosurgery;  Laterality: N/A;   BREAST LUMPECTOMY Right 12/17/2014   BREAST LUMPECTOMY WITH RADIOACTIVE SEED LOCALIZATION Right 12/17/2014   Procedure: RIGHT BREAST RADIOACTIVE SEED GUIDED PARTIAL MASTECTOMY;  Surgeon: Erroll Luna, MD;  Location: [facility];   Service: General;  Laterality: Right;   CESAREAN SECTION     KNEE SURGERY     arthroscopic left   TRANSFORAMINAL LUMBAR INTERBODY FUSION W/ MIS 2 LEVEL Right 05/21/2020   Procedure: LUMBAR FOUR-FIVE, LUMBAR FIVE-SACRAL ONE MINIMALLY INVASIVE TRANSFORAMINAL LUMBAR INTERBODY FUSION WITH INSTRUMENTATION LUMBAR TWO-SACRAL ONE;  Surgeon: Vallarie Mare, MD;  Location: [facility];  Service: Neurosurgery;  Laterality: Right;     Current Outpatient Medications:    Accu-Chek FastClix Lancets MISC, 1 EACH BY DOES NOT APPLY ROUTE DAILY. USE FOR ACCU-CHEK GUIDE, Disp: 102 each, Rfl: 3   ACCU-CHEK GUIDE test strip, USE AS DIRECTED, Disp: 100 strip, Rfl: 3   acetaminophen (TYLENOL) 650 MG CR tablet, Take 650 mg by mouth every 8 (eight) hours as needed for pain., Disp: , Rfl:    aspirin 81 MG tablet, Take 81 mg by mouth daily., Disp: , Rfl:    azelastine (OPTIVAR) 0.05 % ophthalmic solution, Place 1 drop into both eyes daily as needed (allergies)., Disp: , Rfl:    Blood Glucose Monitoring Suppl (ACCU-CHEK GUIDE ME) w/Device KIT, USE TO CHECK BLOOD SUGAR ONCE DAILY, Disp: 1 kit, Rfl: 1   CALCIUM PO, Take 1,000 mg by mouth daily., Disp: , Rfl:    cetirizine (ZYRTEC) 10 MG tablet, Take 10 mg by mouth daily as needed for allergies., Disp: , Rfl:    chlorthalidone (HYGROTON) 25 MG tablet, TAKE 1 TABLET BY MOUTH EVERY DAY, Disp: 90 tablet,  Rfl: 1   cyclobenzaprine (FLEXERIL) 10 MG tablet, Take 1 tablet (10 mg total) by mouth 3 (three) times daily as needed for muscle spasms., Disp: 60 tablet, Rfl: 0   EPIPEN 2-PAK 0.3 MG/0.3ML SOAJ injection, Inject 0.3 mg into the muscle as needed for anaphylaxis., Disp: , Rfl: 1   fluticasone (FLONASE) 50 MCG/ACT nasal spray, Place 1 spray into both nostrils daily. (Patient taking differently: Place 1 spray into both nostrils daily as needed for allergies.), Disp: 16 g, Rfl: 5   KLOR-CON M20 20 MEQ tablet, TAKE 1 TABLET BY MOUTH TWICE A DAY, Disp: 180 tablet, Rfl: 1   Melatonin 10  MG TABS, Take 10 mg by mouth at bedtime as needed (sleep)., Disp: , Rfl:    metFORMIN (GLUCOPHAGE-XR) 500 MG 24 hr tablet, Take 2 tablets (1,000 mg total) by mouth daily with supper., Disp: 180 tablet, Rfl: 1   Multiple Vitamin (MULTIVITAMIN) tablet, Take 1 tablet by mouth daily., Disp: , Rfl:    NON FORMULARY, EVERY OTHER WEEK ALLERGY INJECTIONS, Disp: , Rfl:    PROAIR HFA 108 (90 BASE) MCG/ACT inhaler, Inhale 1 puff into the lungs every 6 (six) hours as needed for shortness of breath or wheezing., Disp: , Rfl: 0   Simethicone (GAS-X PO), Take 1 tablet by mouth daily as needed (gas)., Disp: , Rfl:    traMADol (ULTRAM) 50 MG tablet, Take 1 tablet (50 mg total) by mouth every 6 (six) hours as needed., Disp: 20 tablet, Rfl: 0  Allergies  Allergen Reactions   Atorvastatin     Hallucinations    Hydrochlorothiazide     Muscle pain   Macrobid [Nitrofurantoin Monohyd Macro]      Liver problems   Naproxen Swelling    Mouth and tongue swelling   Simvastatin     Hallucinations, muscle pains   Zanaflex [Tizanidine Hcl] Other (See Comments)     Liver problems          Objective:  Physical Exam  General: AAO x3, NAD  Dermatological: Incurvation present to the left lateral nail border.  There is no sign of edema, erythema, drainage or pus or any signs of infection noted today but there is tenderness to palpation.  In general the nails hypertrophic, dystrophic with yellow, brown discoloration.  There is no open lesions.  Vascular: Dorsalis Pedis artery and Posterior Tibial artery pedal pulses are 2/4 bilateral with immedate capillary fill time. There is no pain with calf compression, swelling, warmth, erythema.   Neruologic: Grossly intact via light touch bilateral.  Musculoskeletal: Numbness to the left lateral nail border.  No other areas of discomfort.  Muscular strength 5/5 in all groups tested bilateral.  Gait: Unassisted, Nonantalgic.       Assessment:   Ingrown toenail left  lateral nail border    Plan:  -Treatment options discussed including all alternatives, risks, and complications -Etiology of symptoms were discussed -I discussed debridement of the nail versus partial nail removal.  Ultimately she did not want proceed with partial nail avulsion.  I was able to sharply debride the symptomatic portion ingrown toenail with any complications or bleeding.  After debridement there is resolution of pain.  Discussed daily foot inspection and monitor any signs or symptoms of infection or reoccurrence.  We will get her back on the schedule to have routine nail debridements.  Trula Slade DPM

## 2021-07-17 DIAGNOSIS — J301 Allergic rhinitis due to pollen: Secondary | ICD-10-CM | POA: Diagnosis not present

## 2021-07-17 DIAGNOSIS — J3081 Allergic rhinitis due to animal (cat) (dog) hair and dander: Secondary | ICD-10-CM | POA: Diagnosis not present

## 2021-07-17 DIAGNOSIS — J3089 Other allergic rhinitis: Secondary | ICD-10-CM | POA: Diagnosis not present

## 2021-07-22 ENCOUNTER — Ambulatory Visit
Admission: RE | Admit: 2021-07-22 | Discharge: 2021-07-22 | Disposition: A | Discharge status: Home / Self Care | Payer: Medicare PPO | Source: Ambulatory Visit | Attending: Hematology | Admitting: Hematology

## 2021-07-22 DIAGNOSIS — E2839 Other primary ovarian failure: Secondary | ICD-10-CM

## 2021-07-22 DIAGNOSIS — Z780 Asymptomatic menopausal state: Secondary | ICD-10-CM | POA: Diagnosis not present

## 2021-07-25 ENCOUNTER — Other Ambulatory Visit: Payer: Self-pay | Admitting: Internal Medicine

## 2021-07-31 DIAGNOSIS — J3089 Other allergic rhinitis: Secondary | ICD-10-CM | POA: Diagnosis not present

## 2021-07-31 DIAGNOSIS — J301 Allergic rhinitis due to pollen: Secondary | ICD-10-CM | POA: Diagnosis not present

## 2021-08-13 DIAGNOSIS — J3081 Allergic rhinitis due to animal (cat) (dog) hair and dander: Secondary | ICD-10-CM | POA: Diagnosis not present

## 2021-08-13 DIAGNOSIS — J3089 Other allergic rhinitis: Secondary | ICD-10-CM | POA: Diagnosis not present

## 2021-08-13 DIAGNOSIS — J301 Allergic rhinitis due to pollen: Secondary | ICD-10-CM | POA: Diagnosis not present

## 2021-08-28 DIAGNOSIS — J301 Allergic rhinitis due to pollen: Secondary | ICD-10-CM | POA: Diagnosis not present

## 2021-08-28 DIAGNOSIS — J3089 Other allergic rhinitis: Secondary | ICD-10-CM | POA: Diagnosis not present

## 2021-08-28 DIAGNOSIS — J3081 Allergic rhinitis due to animal (cat) (dog) hair and dander: Secondary | ICD-10-CM | POA: Diagnosis not present

## 2021-09-04 ENCOUNTER — Encounter: Payer: Self-pay | Admitting: Gastroenterology

## 2021-09-09 DIAGNOSIS — J301 Allergic rhinitis due to pollen: Secondary | ICD-10-CM | POA: Diagnosis not present

## 2021-09-09 DIAGNOSIS — J3089 Other allergic rhinitis: Secondary | ICD-10-CM | POA: Diagnosis not present

## 2021-09-09 DIAGNOSIS — J3081 Allergic rhinitis due to animal (cat) (dog) hair and dander: Secondary | ICD-10-CM | POA: Diagnosis not present

## 2021-09-12 ENCOUNTER — Other Ambulatory Visit: Payer: Self-pay | Admitting: Internal Medicine

## 2021-09-23 DIAGNOSIS — J3089 Other allergic rhinitis: Secondary | ICD-10-CM | POA: Diagnosis not present

## 2021-09-23 DIAGNOSIS — J301 Allergic rhinitis due to pollen: Secondary | ICD-10-CM | POA: Diagnosis not present

## 2021-10-07 ENCOUNTER — Ambulatory Visit: Payer: Medicare PPO | Admitting: Internal Medicine

## 2021-10-13 DIAGNOSIS — J301 Allergic rhinitis due to pollen: Secondary | ICD-10-CM | POA: Diagnosis not present

## 2021-10-13 DIAGNOSIS — J3089 Other allergic rhinitis: Secondary | ICD-10-CM | POA: Diagnosis not present

## 2021-10-13 DIAGNOSIS — J3081 Allergic rhinitis due to animal (cat) (dog) hair and dander: Secondary | ICD-10-CM | POA: Diagnosis not present

## 2021-10-14 ENCOUNTER — Telehealth: Payer: Self-pay | Admitting: *Deleted

## 2021-10-16 ENCOUNTER — Encounter: Payer: Self-pay | Admitting: Podiatry

## 2021-10-16 ENCOUNTER — Ambulatory Visit: Payer: Medicare PPO | Admitting: Podiatry

## 2021-10-16 DIAGNOSIS — I10 Essential (primary) hypertension: Secondary | ICD-10-CM

## 2021-10-16 DIAGNOSIS — B351 Tinea unguium: Secondary | ICD-10-CM

## 2021-10-16 DIAGNOSIS — M79674 Pain in right toe(s): Secondary | ICD-10-CM | POA: Diagnosis not present

## 2021-10-16 DIAGNOSIS — E119 Type 2 diabetes mellitus without complications: Secondary | ICD-10-CM

## 2021-10-16 DIAGNOSIS — M79675 Pain in left toe(s): Secondary | ICD-10-CM

## 2021-10-20 ENCOUNTER — Ambulatory Visit: Payer: Medicare PPO | Admitting: Internal Medicine

## 2021-10-20 ENCOUNTER — Encounter: Payer: Self-pay | Admitting: Internal Medicine

## 2021-10-20 VITALS — BP 130/80 | HR 105 | Temp 98.5°F | Wt 251.1 lb

## 2021-10-20 DIAGNOSIS — E785 Hyperlipidemia, unspecified: Secondary | ICD-10-CM

## 2021-10-20 DIAGNOSIS — M48061 Spinal stenosis, lumbar region without neurogenic claudication: Secondary | ICD-10-CM | POA: Diagnosis not present

## 2021-10-20 DIAGNOSIS — I10 Essential (primary) hypertension: Secondary | ICD-10-CM | POA: Diagnosis not present

## 2021-10-20 DIAGNOSIS — E119 Type 2 diabetes mellitus without complications: Secondary | ICD-10-CM | POA: Diagnosis not present

## 2021-10-20 DIAGNOSIS — Z789 Other specified health status: Secondary | ICD-10-CM | POA: Diagnosis not present

## 2021-10-20 LAB — POCT GLYCOSYLATED HEMOGLOBIN (HGB A1C): Hemoglobin A1C: 6.8 % — AB (ref 4.0–5.6)

## 2021-10-20 NOTE — Progress Notes (Signed)
Established Patient Office Visit     CC/Reason for Visit: 68-monthfollow-up chronic medical conditions  HPI: Darlene TIPPINGis a 71y.o. female who is coming in today for the above mentioned reasons. Past Medical History is significant for: Hypertension, hyperlipidemia with severe statin intolerance, type 2 diabetes, morbid obesity, lumbar spinal stenosis status post surgery in 2022.  She is doing well today and has no acute concerns or complaints.  She has both her colonoscopy and her eye exams scheduled.   Past Medical/Surgical History: Past Medical History:  Diagnosis Date   ALLERGIC RHINITIS 05/15/2008   Anemia    ASTHMA 05/15/2008   Asthma    Blood transfusion without reported diagnosis 1991   anemia   [place]   [place] breast   [place]    DCIS right breast   Di[place]    GERD (gastroesophageal reflux disease)    Headache(784.0) 05/15/2008   HYPERTENSION 05/15/2008   IMPAIRED GLUCOSE TOLERANCE 05/15/2008   Obesity    OSTEOARTHRITIS 05/15/2008   back    Past Surgical History:  Procedure Laterality Date   ABDOMINAL HYSTERECTOMY     ANTERIOR LAT LUMBAR FUSION Left 05/21/2020   Procedure: LUMBAR TWO-THREE, LUMBAR THREE-FOUR DIRECT LATERAL LUMBAR INTERBODY FUSION;  Surgeon: TVallarie Mare MD;  [facility]  Service: Neurosurgery;  Laterality: Left;   APPLICATION OF ROBOTIC ASSISTANCE FOR SPINAL PROCEDURE N/A 05/21/2020   Procedure: APPLICATION OF ROBOTIC ASSISTANCE FOR SPINAL PROCEDURE;  Surgeon: TVallarie Mare MD;  [place] [facility]  Service: Neurosurgery;  Laterality: N/A;   BREAST LUMPECTOMY Right 12/17/2014   BREAST LUMPECTOMY WITH RADIOACTIVE SEED LOCALIZATION Right 12/17/2014   Procedure: RIGHT BREAST RADIOACTIVE SEED GUIDED PARTIAL MASTECTOMY;  Surgeon: TErroll Luna MD;  [place] [facility]  Service: General;  Laterality: Right;   CESAREAN SECTION     KNEE SURGERY      arthroscopic left   TRANSFORAMINAL LUMBAR INTERBODY FUSION W/ MIS 2 LEVEL Right 05/21/2020   Procedure: LUMBAR FOUR-FIVE, LUMBAR FIVE-SACRAL ONE MINIMALLY INVASIVE TRANSFORAMINAL LUMBAR INTERBODY FUSION WITH INSTRUMENTATION LUMBAR TWO-SACRAL ONE;  Surgeon: TVallarie Mare MD;  [facility] [facility]  Service: Neurosurgery;  Laterality: Right;    Social History:  reports that she has never smoked. She has never used smokeless tobacco. She reports current alcohol use. She reports that she does not use drugs.  Allergies: Allergies  Allergen Reactions   Atorvastatin     Hallucinations    Hydrochlorothiazide     Muscle pain   Hydrocodone     Other reaction(s): Unknown   Macrobid [Nitrofurantoin Monohyd Macro]      Liver problems   Naproxen Swelling    Mouth and tongue swelling   Simvastatin     Hallucinations, muscle pains   Zanaflex [Tizanidine Hcl] Other (See Comments)     Liver problems    Family History:  Family History  Problem Relation Age of Onset   Diabetes Mother    Hypertension Mother    Breast cancer Mother [id number]      Breast cancer twice    Hyperlipidemia Father    Colon cancer Brother        462th brother   Diabetes Brother    Pancreatic cancer Brother        middle brother   Stomach cancer Neg Hx      Current Outpatient Medications:    Accu-Chek FastClix Lancets MISC, 1 EACH BY DOES  NOT APPLY ROUTE DAILY. USE FOR ACCU-CHEK GUIDE, Disp: 102 each, Rfl: 3   ACCU-CHEK GUIDE test strip, USE AS DIRECTED, Disp: 100 strip, Rfl: 3   acetaminophen (TYLENOL) 650 MG CR tablet, Take 650 mg by mouth every 8 (eight) hours as needed for pain., Disp: , Rfl:    aspirin 81 MG tablet, Take 81 mg by mouth daily., Disp: , Rfl:    azelastine (OPTIVAR) 0.05 % ophthalmic solution, Place 1 drop into both eyes daily as needed (allergies)., Disp: , Rfl:    Blood Glucose Monitoring Suppl (ACCU-CHEK GUIDE ME) w/Device KIT, USE TO CHECK BLOOD SUGAR ONCE DAILY, Disp: 1 kit, Rfl: 1   CALCIUM PO,  Take 1,000 mg by mouth daily., Disp: , Rfl:    cetirizine (ZYRTEC) 10 MG tablet, Take 10 mg by mouth daily as needed for allergies., Disp: , Rfl:    chlorthalidone (HYGROTON) 25 MG tablet, TAKE 1 TABLET BY MOUTH EVERY DAY, Disp: 90 tablet, Rfl: 1   cyclobenzaprine (FLEXERIL) 10 MG tablet, Take 1 tablet (10 mg total) by mouth 3 (three) times daily as needed for muscle spasms., Disp: 60 tablet, Rfl: 0   Docusate Sodium (DSS) 100 MG CAPS, Take 1 capsule by mouth 2 (two) times daily., Disp: , Rfl:    EPIPEN 2-PAK 0.3 MG/0.3ML SOAJ injection, Inject 0.3 mg into the muscle as needed for anaphylaxis., Disp: , Rfl: 1   fluticasone (FLONASE) 50 MCG/ACT nasal spray, Place 1 spray into both nostrils daily. (Patient taking differently: Place 1 spray into both nostrils daily as needed for allergies.), Disp: 16 g, Rfl: 5   KLOR-CON M20 20 MEQ tablet, TAKE 1 TABLET BY MOUTH TWICE A DAY, Disp: 180 tablet, Rfl: 1   Melatonin 10 MG TABS, Take 10 mg by mouth at bedtime as needed (sleep)., Disp: , Rfl:    metFORMIN (GLUCOPHAGE-XR) 500 MG 24 hr tablet, TAKE 2 TABLETS (1,000 MG TOTAL) BY MOUTH DAILY WITH SUPPER, Disp: 180 tablet, Rfl: 1   Multiple Vitamin (MULTIVITAMIN) tablet, Take 1 tablet by mouth daily., Disp: , Rfl:    NON FORMULARY, EVERY OTHER WEEK ALLERGY INJECTIONS, Disp: , Rfl:    PROAIR HFA 108 (90 BASE) MCG/ACT inhaler, Inhale 1 puff into the lungs every 6 (six) hours as needed for shortness of breath or wheezing., Disp: , Rfl: 0   Simethicone (GAS-X PO), Take 1 tablet by mouth daily as needed (gas)., Disp: , Rfl:    traMADol (ULTRAM) 50 MG tablet, Take 1 tablet by mouth every 6 (six) hours as needed., Disp: , Rfl:   Review of Systems:  Constitutional: Denies fever, chills, diaphoresis, appetite change and fatigue.  HEENT: Denies photophobia, eye pain, redness, hearing loss, ear pain, congestion, sore throat, rhinorrhea, sneezing, mouth sores, trouble swallowing, neck pain, neck stiffness and tinnitus.    Respiratory: Denies SOB, DOE, cough, chest tightness,  and wheezing.   Cardiovascular: Denies chest pain, palpitations and leg swelling.  Gastrointestinal: Denies nausea, vomiting, abdominal pain, diarrhea, constipation, blood in stool and abdominal distention.  Genitourinary: Denies dysuria, urgency, frequency, hematuria, flank pain and difficulty urinating.  Endocrine: Denies: hot or cold intolerance, sweats, changes in hair or nails, polyuria, polydipsia. Musculoskeletal: Denies myalgias, back pain, joint swelling, arthralgias and gait problem.  Skin: Denies pallor, rash and wound.  Neurological: Denies dizziness, seizures, syncope, weakness, light-headedness, numbness and headaches.  Hematological: Denies adenopathy. Easy bruising, personal or family bleeding history  Psychiatric/Behavioral: Denies suicidal ideation, mood changes, confusion, nervousness, sleep disturbance and agitation  Physical Exam: Vitals:   10/20/21 0932  BP: 130/80  Pulse: (!) 105  Temp: 98.5 F (36.9 C)  TempSrc: Oral  SpO2: 100%  Weight: 251 lb 1.6 oz (113.9 kg)    Body mass index is 43.1 kg/m.   Constitutional: NAD, calm, comfortable, obese Eyes: PERRL, lids and conjunctivae normal, wears corrective lenses ENMT: Mucous membranes are moist.  Respiratory: clear to auscultation bilaterally, no wheezing, no crackles. Normal respiratory effort. No accessory muscle use.  Cardiovascular: Regular rate and rhythm, no murmurs / rubs / gallops. No extremity edema.   Psychiatric: Normal judgment and insight. Alert and oriented x 3. Normal mood.    Impression and Plan:  Diabetes mellitus with coincident hypertension ([place]) - Plan: POCT glycosylated hemoglobin (Hb A1C)  Morbid obesity ([facility])  Essential hypertension  Dyslipidemia  Statin intolerance  Spinal stenosis of lumbar region, unspecified whether neurogenic claudication present  -A1c demonstrates good control at 6.8. -Blood pressure is fairly  well controlled. -She would benefit from statin therapy but has a severe intolerance and is not wanting to pursue other medications. -She is now out of her ankle boot and is improving with her ambulation. -Discussed healthy lifestyle, including increased physical activity and better food choices to promote weight loss.  -Schedule follow-up for preventive health exam in 3 months.  Time spent:31 minutes reviewing chart, interviewing and examining patient and formulating plan of care.    Lelon Frohlich, MD [facility]

## 2021-10-21 ENCOUNTER — Encounter: Payer: Self-pay | Admitting: Gastroenterology

## 2021-10-21 ENCOUNTER — Other Ambulatory Visit: Payer: Self-pay | Admitting: Gastroenterology

## 2021-10-21 ENCOUNTER — Ambulatory Visit (AMBULATORY_SURGERY_CENTER): Payer: Medicare PPO | Admitting: *Deleted

## 2021-10-21 VITALS — Ht 64.0 in | Wt 249.4 lb

## 2021-10-21 DIAGNOSIS — Z86010 Personal history of colonic polyps: Secondary | ICD-10-CM

## 2021-10-21 DIAGNOSIS — Z800 Family history of malignant neoplasm of digestive organs: Secondary | ICD-10-CM

## 2021-10-21 MED ORDER — NA SULFATE-K SULFATE-MG SULF 17.5-3.13-1.6 GM/177ML PO SOLN: 1.0000 | Freq: Once | ORAL | 0 refills | Status: AC

## 2021-10-21 NOTE — Progress Notes (Signed)
  No trouble with anesthesia, denies being told they were difficult to intubate, or hx/fam hx of malignant hyperthermia per pt    No egg or soy allergy  No home oxygen use   No medications for weight loss taken  Pt has constipation issues- 2 day Suprep/MIralax prep given  Pt informed that we do not do prior authorizations for prep

## 2021-10-22 NOTE — Progress Notes (Signed)
  Subjective:  Patient ID: Darlene Mclaughlin, female    DOB: 03-12-1950,  MRN: [id number]  71 y.o. female presents with preventative diabetic foot care and painful elongated mycotic toenails 1-5 bilaterally which are tender when wearing enclosed shoe gear. Pain is relieved with periodic professional debridement.  She states her left great toe is tender. She confirms it is the medial border. Denies any redness, drainage or swelling.  Last known HgA1c was unknown. Patient did not check blood glucose this morning.  New problem(s): None    PCP: Isaac Bliss, Rayford Halsted, MD and last visit was: Jun 30, 2021.  Review of Systems: Negative except as noted in the HPI.   Allergies  Allergen Reactions   Atorvastatin     Hallucinations    Hydrochlorothiazide     Muscle pain   Hydrocodone     Swollen tongue   Macrobid [Nitrofurantoin Monohyd Macro]      Liver problems   Naproxen Swelling    Mouth and tongue swelling   Simvastatin     Hallucinations, muscle pains   Zanaflex [Tizanidine Hcl] Other (See Comments)     Liver problems    Objective:  There were no vitals filed for this visit. Constitutional Patient is a pleasant 70 y.o. African American female in NAD. AAO x 3.  Vascular Capillary fill time to digits immediate b/l.  DP/PT pulse(s) are palpable b/l lower extremities. Pedal hair sparse. Lower extremity skin temperature gradient within normal limits. No pain with calf compression b/l. No edema noted b/l lower extremities. No cyanosis or clubbing noted.   Neurologic Protective sensation intact 5/5 intact bilaterally with 10g monofilament b/l. No clonus b/l.   Dermatologic Pedal skin is warm and supple b/l.  No open wounds b/l lower extremities. No interdigital macerations b/l lower extremities. Toenails 1-5 b/l elongated, discolored, dystrophic, thickened, crumbly with subungual debris and tenderness to dorsal palpation. Incurvated nailplate medial border left hallux.  Nail border  hypertrophy absent. There is tenderness to palpation. Sign(s) of infection: no clinical signs of infection noted on examination today..  Orthopedic: Normal muscle strength 5/5 to all lower extremity muscle groups bilaterally. Patient ambulates independent of any assistive aids.       Latest Ref Rng & Units 10/20/2021    9:31 AM 06/30/2021   10:37 AM 04/02/2021    9:31 AM 12/31/2020    8:31 AM  Hemoglobin A1C  Hemoglobin-A1c 4.0 - 5.6 % 6.8  6.6  6.6  6.5    Assessment:   1. Pain due to onychomycosis of toenails of both feet   2. Diabetes mellitus with coincident hypertension ([place])    Plan:  Patient was evaluated and treated and all questions answered. Consent given for treatment as described below: -Patient was evaluated and treated. All patient's and/or POA's questions/concerns answered on today's visit. -Continue foot and shoe inspections daily. Monitor blood glucose per PCP/Endocrinologist's recommendations. -Patient to continue soft, supportive shoe gear daily. -Toenails 1-5 b/l were debrided in length and girth with sterile nail nippers and dremel without iatrogenic bleeding.  -Offending nail border debrided and curretaged left great toe utilizing sterile nail nipper and currette. Border(s) cleansed with alcohol and TAO applied. Patient/POA/Caregiver/Facility instructed to apply Neosporin Cream  to left great toe once daily for 7 days. Call office if there are any concerns. -Patient/POA to call should there be question/concern in the interim.  Return in about 3 months (around 01/16/2022).  Marzetta Board, DPM

## 2021-10-22 NOTE — Telephone Encounter (Signed)
Patient rescheduled with [facility]

## 2021-10-30 DIAGNOSIS — J301 Allergic rhinitis due to pollen: Secondary | ICD-10-CM | POA: Diagnosis not present

## 2021-10-30 DIAGNOSIS — J3089 Other allergic rhinitis: Secondary | ICD-10-CM | POA: Diagnosis not present

## 2021-10-30 DIAGNOSIS — J3081 Allergic rhinitis due to animal (cat) (dog) hair and dander: Secondary | ICD-10-CM | POA: Diagnosis not present

## 2021-11-06 ENCOUNTER — Encounter: Payer: Medicare PPO | Admitting: Gastroenterology

## 2021-11-06 ENCOUNTER — Ambulatory Visit (AMBULATORY_SURGERY_CENTER): Payer: Medicare PPO | Admitting: Gastroenterology

## 2021-11-06 ENCOUNTER — Encounter: Payer: Self-pay | Admitting: Gastroenterology

## 2021-11-06 VITALS — BP 140/68 | HR 91 | Temp 96.0°F | Resp 16 | Ht 64.0 in | Wt 249.0 lb

## 2021-11-06 DIAGNOSIS — Z86010 Personal history of colonic polyps: Secondary | ICD-10-CM | POA: Diagnosis not present

## 2021-11-06 DIAGNOSIS — E119 Type 2 diabetes mellitus without complications: Secondary | ICD-10-CM | POA: Diagnosis not present

## 2021-11-06 DIAGNOSIS — Z09 Encounter for follow-up examination after completed treatment for conditions other than malignant neoplasm: Secondary | ICD-10-CM | POA: Diagnosis not present

## 2021-11-06 DIAGNOSIS — J45909 Unspecified asthma, uncomplicated: Secondary | ICD-10-CM | POA: Diagnosis not present

## 2021-11-06 DIAGNOSIS — Z800 Family history of malignant neoplasm of digestive organs: Secondary | ICD-10-CM | POA: Diagnosis not present

## 2021-11-06 DIAGNOSIS — I10 Essential (primary) hypertension: Secondary | ICD-10-CM | POA: Diagnosis not present

## 2021-11-06 MED ORDER — SODIUM CHLORIDE 0.9 % IV SOLN: 500.0000 mL | Freq: Once | INTRAVENOUS | Status: DC

## 2021-11-06 NOTE — Progress Notes (Unsigned)
GASTROENTEROLOGY PROCEDURE H&P NOTE   Primary Care Physician: Isaac Bliss, Rayford Halsted, MD  HPI: Darlene Mclaughlin is a 71 y.o. female who presents for colonoscopy for surveillance of prior adenomas and family history of colon cancer.  Past Medical History:  Diagnosis Date   ALLERGIC RHINITIS 05/15/2008   Allergy    Anemia    ASTHMA 05/15/2008   Asthma    Blood transfusion without reported diagnosis 1991   anemia   Breast cancer ([place]) 2016   right breast   Cancer ([place])    DCIS right breast   Diabetes mellitus without complication ([place])    GERD (gastroesophageal reflux disease)    Headache(784.0) 05/15/2008   Hyperlipidemia    HYPERTENSION 05/15/2008   IMPAIRED GLUCOSE TOLERANCE 05/15/2008   Obesity    OSTEOARTHRITIS 05/15/2008   back   Past Surgical History:  Procedure Laterality Date   ABDOMINAL HYSTERECTOMY     ANTERIOR LAT LUMBAR FUSION Left 05/21/2020   Procedure: LUMBAR TWO-THREE, LUMBAR THREE-FOUR DIRECT LATERAL LUMBAR INTERBODY FUSION;  Surgeon: Vallarie Mare, MD;  Location: [facility];  Service: Neurosurgery;  Laterality: Left;   APPLICATION OF ROBOTIC ASSISTANCE FOR SPINAL PROCEDURE N/A 05/21/2020   Procedure: APPLICATION OF ROBOTIC ASSISTANCE FOR SPINAL PROCEDURE;  Surgeon: Vallarie Mare, MD;  Location: [facility];  Service: Neurosurgery;  Laterality: N/A;   BREAST LUMPECTOMY Right 12/17/2014   BREAST LUMPECTOMY WITH RADIOACTIVE SEED LOCALIZATION Right 12/17/2014   Procedure: RIGHT BREAST RADIOACTIVE SEED GUIDED PARTIAL MASTECTOMY;  Surgeon: Erroll Luna, MD;  Location: [facility];  Service: General;  Laterality: Right;   CESAREAN SECTION     x2   COLONOSCOPY     KNEE SURGERY     arthroscopic left   TRANSFORAMINAL LUMBAR INTERBODY FUSION W/ MIS 2 LEVEL Right 05/21/2020   Procedure: LUMBAR FOUR-FIVE, LUMBAR FIVE-SACRAL ONE MINIMALLY INVASIVE TRANSFORAMINAL LUMBAR INTERBODY FUSION WITH INSTRUMENTATION LUMBAR TWO-SACRAL ONE;  Surgeon:  Vallarie Mare, MD;  Location: [facility];  Service: Neurosurgery;  Laterality: Right;   Current Outpatient Medications  Medication Sig Dispense Refill   Accu-Chek FastClix Lancets MISC 1 EACH BY DOES NOT APPLY ROUTE DAILY. USE FOR ACCU-CHEK GUIDE 102 each 3   ACCU-CHEK GUIDE test strip USE AS DIRECTED 100 strip 3   acetaminophen (TYLENOL) 650 MG CR tablet Take 650 mg by mouth every 8 (eight) hours as needed for pain.     aspirin 81 MG tablet Take 81 mg by mouth daily.     Blood Glucose Monitoring Suppl (ACCU-CHEK GUIDE ME) w/Device KIT USE TO CHECK BLOOD SUGAR ONCE DAILY 1 kit 1   chlorthalidone (HYGROTON) 25 MG tablet TAKE 1 TABLET BY MOUTH EVERY DAY 90 tablet 1   cyclobenzaprine (FLEXERIL) 10 MG tablet Take 1 tablet (10 mg total) by mouth 3 (three) times daily as needed for muscle spasms. 60 tablet 0   Docusate Sodium (DSS) 100 MG CAPS Take 1 capsule by mouth 2 (two) times daily.     fluticasone (FLONASE) 50 MCG/ACT nasal spray Place 1 spray into both nostrils daily. (Patient taking differently: Place 1 spray into both nostrils daily as needed for allergies.) 16 g 5   KLOR-CON M20 20 MEQ tablet TAKE 1 TABLET BY MOUTH TWICE A DAY 180 tablet 1   Melatonin 10 MG TABS Take 10 mg by mouth at bedtime as needed (sleep).     metFORMIN (GLUCOPHAGE-XR) 500 MG 24 hr tablet TAKE 2 TABLETS (1,000 MG TOTAL) BY MOUTH DAILY WITH SUPPER 180 tablet  1   Multiple Vitamin (MULTIVITAMIN) tablet Take 1 tablet by mouth daily.     NON FORMULARY EVERY OTHER WEEK ALLERGY INJECTIONS     Simethicone (GAS-X PO) Take 1 tablet by mouth daily as needed (gas).     azelastine (OPTIVAR) 0.05 % ophthalmic solution Place 1 drop into both eyes daily as needed (allergies).     CALCIUM PO Take 1,000 mg by mouth daily.     cetirizine (ZYRTEC) 10 MG tablet Take 10 mg by mouth daily as needed for allergies.     EPIPEN 2-PAK 0.3 MG/0.3ML SOAJ injection Inject 0.3 mg into the muscle as needed for anaphylaxis.  1   PROAIR HFA 108 (90  BASE) MCG/ACT inhaler Inhale 1 puff into the lungs every 6 (six) hours as needed for shortness of breath or wheezing.  0   traMADol (ULTRAM) 50 MG tablet Take 1 tablet by mouth every 6 (six) hours as needed.     Current Facility-Administered Medications  Medication Dose Route Frequency Provider Last Rate Last Admin   0.9 %  sodium chloride infusion  500 mL Intravenous Once Mansouraty, Telford Nab., MD        Current Outpatient Medications:    Accu-Chek FastClix Lancets MISC, 1 EACH BY DOES NOT APPLY ROUTE DAILY. USE FOR ACCU-CHEK GUIDE, Disp: 102 each, Rfl: 3   ACCU-CHEK GUIDE test strip, USE AS DIRECTED, Disp: 100 strip, Rfl: 3   acetaminophen (TYLENOL) 650 MG CR tablet, Take 650 mg by mouth every 8 (eight) hours as needed for pain., Disp: , Rfl:    aspirin 81 MG tablet, Take 81 mg by mouth daily., Disp: , Rfl:    Blood Glucose Monitoring Suppl (ACCU-CHEK GUIDE ME) w/Device KIT, USE TO CHECK BLOOD SUGAR ONCE DAILY, Disp: 1 kit, Rfl: 1   chlorthalidone (HYGROTON) 25 MG tablet, TAKE 1 TABLET BY MOUTH EVERY DAY, Disp: 90 tablet, Rfl: 1   cyclobenzaprine (FLEXERIL) 10 MG tablet, Take 1 tablet (10 mg total) by mouth 3 (three) times daily as needed for muscle spasms., Disp: 60 tablet, Rfl: 0   Docusate Sodium (DSS) 100 MG CAPS, Take 1 capsule by mouth 2 (two) times daily., Disp: , Rfl:    fluticasone (FLONASE) 50 MCG/ACT nasal spray, Place 1 spray into both nostrils daily. (Patient taking differently: Place 1 spray into both nostrils daily as needed for allergies.), Disp: 16 g, Rfl: 5   KLOR-CON M20 20 MEQ tablet, TAKE 1 TABLET BY MOUTH TWICE A DAY, Disp: 180 tablet, Rfl: 1   Melatonin 10 MG TABS, Take 10 mg by mouth at bedtime as needed (sleep)., Disp: , Rfl:    metFORMIN (GLUCOPHAGE-XR) 500 MG 24 hr tablet, TAKE 2 TABLETS (1,000 MG TOTAL) BY MOUTH DAILY WITH SUPPER, Disp: 180 tablet, Rfl: 1   Multiple Vitamin (MULTIVITAMIN) tablet, Take 1 tablet by mouth daily., Disp: , Rfl:    NON FORMULARY, EVERY  OTHER WEEK ALLERGY INJECTIONS, Disp: , Rfl:    Simethicone (GAS-X PO), Take 1 tablet by mouth daily as needed (gas)., Disp: , Rfl:    azelastine (OPTIVAR) 0.05 % ophthalmic solution, Place 1 drop into both eyes daily as needed (allergies)., Disp: , Rfl:    CALCIUM PO, Take 1,000 mg by mouth daily., Disp: , Rfl:    cetirizine (ZYRTEC) 10 MG tablet, Take 10 mg by mouth daily as needed for allergies., Disp: , Rfl:    EPIPEN 2-PAK 0.3 MG/0.3ML SOAJ injection, Inject 0.3 mg into the muscle as needed for anaphylaxis., Disp: ,  Rfl: 1   PROAIR HFA 108 (90 BASE) MCG/ACT inhaler, Inhale 1 puff into the lungs every 6 (six) hours as needed for shortness of breath or wheezing., Disp: , Rfl: 0   traMADol (ULTRAM) 50 MG tablet, Take 1 tablet by mouth every 6 (six) hours as needed., Disp: , Rfl:   Current Facility-Administered Medications:    0.9 %  sodium chloride infusion, 500 mL, Intravenous, Once, Mansouraty, Telford Nab., MD Allergies  Allergen Reactions   Atorvastatin     Hallucinations    Hydrochlorothiazide     Muscle pain   Hydrocodone     Swollen tongue   Macrobid [Nitrofurantoin Monohyd Macro]      Liver problems   Naproxen Swelling    Mouth and tongue swelling   Simvastatin     Hallucinations, muscle pains   Zanaflex [Tizanidine Hcl] Other (See Comments)     Liver problems   Family History  Problem Relation Age of Onset   Diabetes Mother    Hypertension Mother    Breast cancer Mother 68       Breast cancer twice    Hyperlipidemia Father    Colon cancer Brother        3 th brother   Diabetes Brother    Pancreatic cancer Brother        middle brother   Stomach cancer Neg Hx    Esophageal cancer Neg Hx    Rectal cancer Neg Hx    Social History   Socioeconomic History   Marital status: Widowed    Spouse name: Not on file   Number of children: Not on file   Years of education: Not on file   Highest education level: Some college, no degree  Occupational History   Occupation:  Retired  Tobacco Use   Smoking status: Never   Smokeless tobacco: Never  Vaping Use   Vaping Use: Never used  Substance and Sexual Activity   Alcohol use: Yes    Comment: RARELY   Drug use: No   Sexual activity: Not on file  Other Topics Concern   Not on file  Social History Narrative   Not on file   Social Determinants of Health   Financial Resource Strain: Medium Risk (12/28/2020)   Overall Financial Resource Strain (CARDIA)    Difficulty of Paying Living Expenses: Somewhat hard  Food Insecurity: No Food Insecurity (12/28/2020)   Hunger Vital Sign    Worried About Running Out of Food in the Last Year: Never true    Ran Out of Food in the Last Year: Never true  Transportation Needs: No Transportation Needs (12/28/2020)   PRAPARE - Hydrologist (Medical): No    Lack of Transportation (Non-Medical): No  Physical Activity: Sufficiently Active (12/28/2020)   Exercise Vital Sign    Days of Exercise per Week: 3 days    Minutes of Exercise per Session: 90 min  Stress: No Stress Concern Present (12/28/2020)   [place]    Feeling of Stress : Only a little  Social Connections: Socially Isolated (12/28/2020)   Social Connection and Isolation Panel [NHANES]    Frequency of Communication with Friends and Family: More than three times a week    Frequency of Social Gatherings with Friends and Family: Three times a week    Attends Religious Services: Never    Active Member of Clubs or Organizations: No    Attends Archivist Meetings:  Not on file    Marital Status: Widowed  Intimate Partner Violence: Not on file    Physical Exam: Today's Vitals   11/06/21 1508  BP: 136/81  Pulse: (!) 102  Temp: (!) 96 F (35.6 C)  TempSrc: Temporal  SpO2: 99%  Weight: 249 lb (112.9 kg)  Height: 5' 4"  (1.626 m)   Body mass index is 42.74 kg/m. GEN: NAD EYE: Sclerae anicteric ENT: MMM CV:  Non-tachycardic GI: Soft, NT/ND NEURO:  Alert & Oriented x 3  Lab Results: No results for input(s): "WBC", "HGB", "HCT", "PLT" in the last 72 hours. BMET No results for input(s): "NA", "K", "CL", "CO2", "GLUCOSE", "BUN", "CREATININE", "CALCIUM" in the last 72 hours. LFT No results for input(s): "PROT", "ALBUMIN", "AST", "ALT", "ALKPHOS", "BILITOT", "BILIDIR", "IBILI" in the last 72 hours. PT/INR No results for input(s): "LABPROT", "INR" in the last 72 hours.   Impression / Plan: This is a 71 y.o.female who presents for colonoscopy for surveillance of prior adenomas and family history of colon cancer.  The risks and benefits of endoscopic evaluation/treatment were discussed with the patient and/or family; these include but are not limited to the risk of perforation, infection, bleeding, missed lesions, lack of diagnosis, severe illness requiring hospitalization, as well as anesthesia and sedation related illnesses.  The patient's history has been reviewed, patient examined, no change in status, and deemed stable for procedure.  The patient and/or family is agreeable to proceed.    Justice Britain, MD [facility] Advanced [facility]oscopy Office # [phone]

## 2021-11-06 NOTE — Op Note (Signed)
[facility] Patient Name: Darlene Mclaughlin Procedure Date: 11/06/2021 4:16 PM MRN: [id number] Endoscopist: Justice Britain , MD Age: 71 Referring MD:  Date of Birth: [birth year] Gender: Female Account #: [id number] Procedure:                Colonoscopy Indications:              Screening in patient at increased risk: Family                            history of 1st-degree relative with colorectal                            cancer before age 41 years, Surveillance: Personal                            history of adenomatous polyps on last colonoscopy 5                            years ago Medicines:                Monitored Anesthesia Care Procedure:                Pre-Anesthesia Assessment:                           - Prior to the procedure, a History and Physical                            was performed, and patient medications and                            allergies were reviewed. The patient's tolerance of                            previous anesthesia was also reviewed. The risks                            and benefits of the procedure and the sedation                            options and risks were discussed with the patient.                            All questions were answered, and informed consent                            was obtained. Prior Anticoagulants: The patient has                            taken no previous anticoagulant or antiplatelet                            agents except for aspirin. ASA Grade Assessment:  III - A patient with severe systemic disease. After                            reviewing the risks and benefits, the patient was                            deemed in satisfactory condition to undergo the                            procedure.                           After obtaining informed consent, the colonoscope                            was passed under direct vision. Throughout the                             procedure, the patient's blood pressure, pulse, and                            oxygen saturations were monitored continuously. The                            Olympus CF-HQ190L ([id number]) Colonoscope was                            introduced through the anus and advanced to the the                            cecum, identified by appendiceal orifice and                            ileocecal valve. The colonoscopy was performed                            without difficulty. The patient tolerated the                            procedure. The quality of the bowel preparation was                            adequate. The ileocecal valve, appendiceal orifice,                            and rectum were photographed. Scope In: 4:32:08 PM Scope Out: 4:44:05 PM Scope Withdrawal Time: 0 hours 6 minutes 16 seconds  Total Procedure Duration: 0 hours 11 minutes 57 seconds  Findings:                 Skin tags were found on perianal exam.                           The digital rectal exam findings include  hemorrhoids. Pertinent negatives include no                            palpable rectal lesions.                           Normal mucosa was found in the entire colon.                           Non-bleeding non-thrombosed external and internal                            hemorrhoids were found during retroflexion, during                            perianal exam and during digital exam. The                            hemorrhoids were Grade II (internal hemorrhoids                            that prolapse but reduce spontaneously). Complications:            No immediate complications. Estimated Blood Loss:     Estimated blood loss: none. Impression:               - Perianal skin tags found on perianal exam.                           - Hemorrhoids found on digital rectal exam.                           - Normal mucosa in the entire examined colon.                           -  Non-bleeding non-thrombosed external and internal                            hemorrhoids. Recommendation:           - The patient will be observed post-procedure,                            until all discharge criteria are met.                           - Discharge patient to home.                           - Patient has a contact number available for                            emergencies. The signs and symptoms of potential                            delayed complications were discussed with the  patient. Return to normal activities tomorrow.                            Written discharge instructions were provided to the                            patient.                           - High fiber diet.                           - Use FiberCon 1-2 tablets PO daily.                           - Continue present medications.                           - Repeat colonoscopy in 5 years for screening                            purposes due to family history and personal history                            of precancerous colon polyps.                           - The findings and recommendations were discussed                            with the patient.                           - The findings and recommendations were discussed                            with the designated responsible adult. Justice Britain, MD 11/06/2021 4:50:02 PM

## 2021-11-06 NOTE — Progress Notes (Signed)
Pt's states no medical or surgical changes since previsit or office visit. 

## 2021-11-06 NOTE — Progress Notes (Unsigned)
Report to PACU, RN, vss, BBS= Clear.  

## 2021-11-06 NOTE — Patient Instructions (Signed)
No polyps!! Next colonoscopy in 5 years Please read over handouts about hemorrhoids and high fiber diet  Continue your normal medications, use FiberCon 1-2 tablets daily   YOU HAD AN ENDOSCOPIC PROCEDURE TODAY AT [facility]:   Refer to the procedure report that was given to you for any specific questions about what was found during the examination.  If the procedure report does not answer your questions, please call your gastroenterologist to clarify.  If you requested that your care partner not be given the details of your procedure findings, then the procedure report has been included in a sealed envelope for you to review at your convenience later.  YOU SHOULD EXPECT: Some feelings of bloating in the abdomen. Passage of more gas than usual.  Walking can help get rid of the air that was put into your GI tract during the procedure and reduce the bloating. If you had a lower endoscopy (such as a colonoscopy or flexible sigmoidoscopy) you may notice spotting of blood in your stool or on the toilet paper. If you underwent a bowel prep for your procedure, you may not have a normal bowel movement for a few days.  Please Note:  You might notice some irritation and congestion in your nose or some drainage.  This is from the oxygen used during your procedure.  There is no need for concern and it should clear up in a day or so.  SYMPTOMS TO REPORT IMMEDIATELY:  Following lower endoscopy (colonoscopy or flexible sigmoidoscopy):  Excessive amounts of blood in the stool  Significant tenderness or worsening of abdominal pains  Swelling of the abdomen that is new, acute  Fever of 100F or higher  For urgent or emergent issues, a gastroenterologist can be reached at any hour by calling [phone]. Do not use MyChart messaging for urgent concerns.    DIET:  We do recommend a small meal at first, but then you may proceed to your regular diet.  Drink plenty of fluids but you should avoid  alcoholic beverages for 24 hours.  ACTIVITY:  You should plan to take it easy for the rest of today and you should NOT DRIVE or use heavy machinery until tomorrow (because of the sedation medicines used during the test).    FOLLOW UP: Our staff will call the number listed on your records the next business day following your procedure.  We will call around 7:15- 8:00 am to check on you and address any questions or concerns that you may have regarding the information given to you following your procedure. If we do not reach you, we will leave a message.      SIGNATURES/CONFIDENTIALITY: You and/or your care partner have signed paperwork which will be entered into your electronic medical record.  These signatures attest to the fact that that the information above on your After Visit Summary has been reviewed and is understood.  Full responsibility of the confidentiality of this discharge information lies with you and/or your care-partner.

## 2021-11-09 ENCOUNTER — Telehealth: Payer: Self-pay | Admitting: *Deleted

## 2021-11-09 NOTE — Telephone Encounter (Signed)
  Follow up Call-     11/06/2021    3:20 PM  Call back number  Post procedure Call Back phone  # [phone]  Permission to leave phone message Yes     Patient questions:  Do you have a fever, pain , or abdominal swelling? No. Pain Score  0 *  Have you tolerated food without any problems? Yes.    Have you been able to return to your normal activities? Yes.    Do you have any questions about your discharge instructions: Diet   No. Medications  No. Follow up visit  No.  Do you have questions or concerns about your Care? No.  Actions: * If pain score is 4 or above: No action needed, pain <4.

## 2021-11-12 DIAGNOSIS — J301 Allergic rhinitis due to pollen: Secondary | ICD-10-CM | POA: Diagnosis not present

## 2021-11-12 DIAGNOSIS — J3081 Allergic rhinitis due to animal (cat) (dog) hair and dander: Secondary | ICD-10-CM | POA: Diagnosis not present

## 2021-11-12 DIAGNOSIS — J3089 Other allergic rhinitis: Secondary | ICD-10-CM | POA: Diagnosis not present

## 2021-11-18 DIAGNOSIS — J3089 Other allergic rhinitis: Secondary | ICD-10-CM | POA: Diagnosis not present

## 2021-11-18 DIAGNOSIS — J301 Allergic rhinitis due to pollen: Secondary | ICD-10-CM | POA: Diagnosis not present

## 2021-11-23 DIAGNOSIS — E119 Type 2 diabetes mellitus without complications: Secondary | ICD-10-CM | POA: Diagnosis not present

## 2021-11-23 LAB — HM DIABETES EYE EXAM

## 2021-11-30 DIAGNOSIS — J3089 Other allergic rhinitis: Secondary | ICD-10-CM | POA: Diagnosis not present

## 2021-11-30 DIAGNOSIS — J3081 Allergic rhinitis due to animal (cat) (dog) hair and dander: Secondary | ICD-10-CM | POA: Diagnosis not present

## 2021-11-30 DIAGNOSIS — J301 Allergic rhinitis due to pollen: Secondary | ICD-10-CM | POA: Diagnosis not present

## 2021-12-01 NOTE — Progress Notes (Unsigned)
[place]   [phone] Fax:([phone]) [phone]   Clinic Follow up Note   Patient Care Team: Isaac Bliss, Rayford Halsted, MD as PCP - General (Internal Medicine) Erroll Luna, MD as Consulting Physician (General Surgery) Arloa Koh, MD (Inactive) as Consulting Physician (Radiation Oncology) Truitt Merle, MD as Consulting Physician (Hematology) Sylvan Cheese, NP as Nurse Practitioner (Hematology and Oncology) 12/02/2021  CHIEF COMPLAINT: Follow-up right breast DCIS  SUMMARY OF ONCOLOGIC HISTORY: Oncology History Overview Note  Breast cancer of lower-outer quadrant of right female breast [facility])   Staging form: Breast, AJCC 7th Edition     Clinical stage from 11/15/2014: Stage 0 (Tis (DCIS), N0, M0) - Signed by Truitt Merle, MD on 11/26/2014     Ductal carcinoma in situ (DCIS) of right breast  11/06/2014 Mammogram   Screening mammogram showed calcifications in the lower outer quadrant of the right breast, which was confirmed by diagnostic mammogram   11/15/2014 Initial Biopsy   Breast core needle biopsy showed ductal carcinoma in situ with calcifications.   11/15/2014 Receptors her2   ER+ (100%); PR+ (90%)   11/15/2014 Clinical Stage   Stage 0: Tis N0   12/17/2014 Surgery   Right breast lumpectomy.   12/17/2014 Pathology Results   Right breast lumpectomy showed usual ductal Hyperplasia and fibroadenoma. No residual ductal carcinoma in situ.     Radiation Therapy   Not recommended due to lack of residual malignancy at time of definitive surgery   03/28/2015 - 08/2017 Anti-estrogen oral therapy   Anastrozole 1 mg daily begun 03/28/2015 and stopped 08/2017 due to poor tolerance.    05/01/2015 Survivorship   Survivorship visit completed and copy of care plan provided to patient   11/06/2015 Imaging   MM DIAG BREAST TOMO BILATERAL 11/06/15 IMPRESSION: No mammographic evidence of malignancy. BI-RADS CATEGORY  2: Benign.   08/04/2016 Pathology Results    Diagnosis  Surgical [P], cecum, polyp - TUBULAR ADENOMA. - NO HIGH GRADE DYSPLASIA OR MALIGNANCY.   08/04/2016 Procedure   Colonoscopy A 2 mm polyp was found in the cecum. The polyp was sessile. The polyp was removed with a cold snare. Resection and retrieval were complete. Findings: - The exam was otherwise without abnormality on direct and retroflexion views.   11/11/2016 Mammogram   IMPRESSION: 1. No mammographic evidence of malignancy. 2. Stable right breast postsurgical changes.     CURRENT THERAPY: Surveillance  INTERVAL HISTORY: Darlene Mclaughlin returns for follow-up as scheduled, last seen by Dr. Burr Medico 12/03/2020.  Mammogram later that month was negative.  DEXA 07/22/2021 shows normal bone density.  A screening colonoscopy showed perianal skin tags and hemorrhoids, no biopsies were taken, repeat colonoscopy due in 5 years. She has started fiber.  Otherwise doing well without changes in her health or new specific concerns today particularly no new breast lump/mass, nipple discharge or inversion, or skin change.  All other systems were reviewed with the patient and are negative.  MEDICAL HISTORY:  Past Medical History:  Diagnosis Date   ALLERGIC RHINITIS 05/15/2008   Allergy    Anemia    ASTHMA 05/15/2008   Asthma    Blood transfusion without reported diagnosis 1991   anemia   Breast cancer ([place]) 2016   right breast   Cancer ([place])    DCIS right breast   Diabetes mellitus without complication ([place])    GERD (gastroesophageal reflux disease)    Headache(784.0) 05/15/2008   Hyperlipidemia    HYPERTENSION 05/15/2008   IMPAIRED GLUCOSE TOLERANCE 05/15/2008  Obesity    OSTEOARTHRITIS 05/15/2008   back    SURGICAL HISTORY: Past Surgical History:  Procedure Laterality Date   ABDOMINAL HYSTERECTOMY     ANTERIOR LAT LUMBAR FUSION Left 05/21/2020   Procedure: LUMBAR TWO-THREE, LUMBAR THREE-FOUR DIRECT LATERAL LUMBAR INTERBODY FUSION;  Surgeon: Vallarie Mare, MD;   Location: [place];  Service: Neurosurgery;  Laterality: Left;   APPLICATION OF ROBOTIC ASSISTANCE FOR SPINAL PROCEDURE N/A 05/21/2020   Procedure: APPLICATION OF ROBOTIC ASSISTANCE FOR SPINAL PROCEDURE;  Surgeon: Vallarie Mare, MD;  Location: [place];  Service: Neurosurgery;  Laterality: N/A;   BREAST LUMPECTOMY Right 12/17/2014   BREAST LUMPECTOMY WITH RADIOACTIVE SEED LOCALIZATION Right 12/17/2014   Procedure: RIGHT BREAST RADIOACTIVE SEED GUIDED PARTIAL MASTECTOMY;  Surgeon: Erroll Luna, MD;  Location: [place];  Service: General;  Laterality: Right;   CESAREAN SECTION     x2   COLONOSCOPY     KNEE SURGERY     arthroscopic left   TRANSFORAMINAL LUMBAR INTERBODY FUSION W/ MIS 2 LEVEL Right 05/21/2020   Procedure: LUMBAR FOUR-FIVE, LUMBAR FIVE-SACRAL ONE MINIMALLY INVASIVE TRANSFORAMINAL LUMBAR INTERBODY FUSION WITH INSTRUMENTATION LUMBAR TWO-SACRAL ONE;  Surgeon: Vallarie Mare, MD;  Location: [place];  Service: Neurosurgery;  Laterality: Right;    I have reviewed the social history and family history with the patient and they are unchanged from previous note.  ALLERGIES:  is allergic to atorvastatin, hydrochlorothiazide, hydrocodone, macrobid [nitrofurantoin monohyd macro], naproxen, simvastatin, and zanaflex [tizanidine hcl].  MEDICATIONS:  Current Outpatient Medications  Medication Sig Dispense Refill   Accu-Chek FastClix Lancets MISC 1 EACH BY DOES NOT APPLY ROUTE DAILY. USE FOR ACCU-CHEK GUIDE 102 each 3   ACCU-CHEK GUIDE test strip USE AS DIRECTED 100 strip 3   acetaminophen (TYLENOL) 650 MG CR tablet Take 650 mg by mouth every 8 (eight) hours as needed for pain.     aspirin 81 MG tablet Take 81 mg by mouth daily.     azelastine (OPTIVAR) 0.05 % ophthalmic solution Place 1 drop into both eyes daily as needed (allergies).     Blood Glucose Monitoring Suppl (ACCU-CHEK GUIDE ME) w/Device KIT USE TO CHECK BLOOD SUGAR ONCE DAILY 1 kit 1   CALCIUM PO Take 1,000 mg  by mouth daily.     cetirizine (ZYRTEC) 10 MG tablet Take 10 mg by mouth daily as needed for allergies.     chlorthalidone (HYGROTON) 25 MG tablet TAKE 1 TABLET BY MOUTH EVERY DAY 90 tablet 1   cyclobenzaprine (FLEXERIL) 10 MG tablet Take 1 tablet (10 mg total) by mouth 3 (three) times daily as needed for muscle spasms. 60 tablet 0   Docusate Sodium (DSS) 100 MG CAPS Take 1 capsule by mouth 2 (two) times daily.     EPIPEN 2-PAK 0.3 MG/0.3ML SOAJ injection Inject 0.3 mg into the muscle as needed for anaphylaxis.  1   fluticasone (FLONASE) 50 MCG/ACT nasal spray Place 1 spray into both nostrils daily. (Patient taking differently: Place 1 spray into both nostrils daily as needed for allergies.) 16 g 5   KLOR-CON M20 20 MEQ tablet TAKE 1 TABLET BY MOUTH TWICE A DAY 180 tablet 1   Melatonin 10 MG TABS Take 10 mg by mouth at bedtime as needed (sleep).     metFORMIN (GLUCOPHAGE-XR) 500 MG 24 hr tablet TAKE 2 TABLETS (1,000 MG TOTAL) BY MOUTH DAILY WITH SUPPER 180 tablet 1   Multiple Vitamin (MULTIVITAMIN) tablet Take 1 tablet by mouth daily.  NON FORMULARY EVERY OTHER WEEK ALLERGY INJECTIONS     PROAIR HFA 108 (90 BASE) MCG/ACT inhaler Inhale 1 puff into the lungs every 6 (six) hours as needed for shortness of breath or wheezing.  0   Simethicone (GAS-X PO) Take 1 tablet by mouth daily as needed (gas).     traMADol (ULTRAM) 50 MG tablet Take 1 tablet by mouth every 6 (six) hours as needed.     No current facility-administered medications for this visit.    PHYSICAL EXAMINATION: ECOG PERFORMANCE STATUS: 0 - Asymptomatic  Vitals:   12/02/21 1035  BP: 130/71  Pulse: (!) 104  Resp: 17  Temp: 97.8 F (36.6 C)  SpO2: 100%   Filed Weights   12/02/21 1035  Weight: 253 lb 4.8 oz (114.9 kg)    GENERAL:alert, no distress and comfortable SKIN: No rash EYES: sclera clear NECK: Without mass LYMPH:  no palpable cervical or supraclavicular lymphadenopathy LUNGS:  normal breathing effort HEART:  no lower extremity edema ABDOMEN:abdomen soft, non-tender and normal bowel sounds Musculoskeletal: No focal spinal tenderness NEURO: alert & oriented x 3 with fluent speech, no focal motor/sensory deficits Breast exam: Symmetrical without nipple discharge or inversion.  S/p right lumpectomy, incisions completely healed.  No palpable mass or nodularity in either breast or axilla that I could appreciate.  LABORATORY DATA:  I have reviewed the data as listed    Latest Ref Rng & Units 12/31/2020    8:55 AM 05/21/2020    3:33 PM 05/21/2020    2:53 PM  CBC  WBC 4.0 - 10.5 K/uL 5.5     Hemoglobin 12.0 - 15.0 g/dL 12.8  9.9  11.9   Hematocrit 36.0 - 46.0 % 38.6  29.0  35.0   Platelets 150.0 - 400.0 K/uL 176.0           Latest Ref Rng & Units 12/31/2020    8:55 AM 05/21/2020    3:33 PM 05/21/2020    2:53 PM  CMP  Glucose 70 - 99 mg/dL 123   186   BUN 6 - 23 mg/dL 14   9   Creatinine 0.40 - 1.20 mg/dL 0.63   0.30   Sodium 135 - 145 mEq/L 140  140  140   Potassium 3.5 - 5.1 mEq/L 3.4  3.3  3.3   Chloride 96 - 112 mEq/L 101   103   CO2 19 - 32 mEq/L 31     Calcium 8.4 - 10.5 mg/dL 9.8     Total Protein 6.0 - 8.3 g/dL 7.8     Total Bilirubin 0.2 - 1.2 mg/dL 0.5     Alkaline Phos 39 - 117 U/L 84     AST 0 - 37 U/L 19     ALT 0 - 35 U/L 14         RADIOGRAPHIC STUDIES: I have personally reviewed the radiological images as listed and agreed with the findings in the report. No results found.   ASSESSMENT & PLAN:  Darlene Mclaughlin is a 71 y.o. female with    1. right breast DCIS, ER/PR strongly positive -Diagnosed in 10/2014. S/p right breast lumpectomy. Adjuvant radiation not recommended due to lack of residual malignancy at time of definitive surgery -Given her ER/PR positive disease, she started on anastrozole for chemoprevention in 03/2015 but stopped in July 2019 due to worsening arthralgia.  -11/2020 mammogram negative -Ms. Apperson is clinically doing well.  Breast exam is benign.   No concern for recurrent or  new malignancy.  We will call breast center to get her scheduled for this year's mammogram -She prefers to continue annual surveillance in our clinic, follow-up in 1 year or sooner if needed   2. Bone Health -DEXA in February 2017 showed osteopenia, no high risk for fracture.  She was on calcium, multivitamin, and vitamin D -11/2017 DEXA improved and normal with lowest t-score -0.9 -07/22/2021 DEXA was normal T score -0.8   3.  Arthritis, spinal stenosis, right leg pain -Her arthritis was mainly in the wrist, previous imaging from at least 2016 shows lumbar spinal stenosis -She developed right leg pain in 10/2019, similar to when she had a reaction to statins in 2019  -Doppler negative for DVT on 11/12/2019  -Chronic pains are stable, she ambulates with a cane, she plans to resume aqua aerobics   4. 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  5-year recall   Plan: Continue breast cancer surveillance Mammogram due this month if not already done, will follow-up with the breast center Follow-up in 1 year, or sooner if needed    Orders Placed This Encounter  Procedures   MM 3D SCREEN BREAST BILATERAL    Standing Status:   Future    Standing Expiration Date:   12/03/2022    Order Specific Question:   Reason for Exam (SYMPTOM  OR DIAGNOSIS REQUIRED)    Answer:   h/o DCIS 11/2014    Order Specific Question:   Preferred imaging location?    Answer:   [facility]   All questions were answered. The patient knows to call the clinic with any problems, questions or concerns. No barriers to learning were detected.     Alla Feeling, NP 12/02/21

## 2021-12-02 ENCOUNTER — Inpatient Hospital Stay: Payer: Medicare PPO | Attending: Nurse Practitioner | Admitting: Nurse Practitioner

## 2021-12-02 ENCOUNTER — Telehealth: Payer: Self-pay

## 2021-12-02 ENCOUNTER — Encounter: Payer: Self-pay | Admitting: Nurse Practitioner

## 2021-12-02 VITALS — BP 130/71 | HR 104 | Temp 97.8°F | Resp 17 | Wt 253.3 lb

## 2021-12-02 DIAGNOSIS — Z86010 Personal history of colonic polyps: Secondary | ICD-10-CM | POA: Insufficient documentation

## 2021-12-02 DIAGNOSIS — Z170 Estrogen receptor positive status [ER+]: Secondary | ICD-10-CM | POA: Diagnosis not present

## 2021-12-02 DIAGNOSIS — D120 Benign neoplasm of cecum: Secondary | ICD-10-CM | POA: Diagnosis not present

## 2021-12-02 DIAGNOSIS — Z923 Personal history of irradiation: Secondary | ICD-10-CM | POA: Insufficient documentation

## 2021-12-02 DIAGNOSIS — Z7984 Long term (current) use of oral hypoglycemic drugs: Secondary | ICD-10-CM | POA: Diagnosis not present

## 2021-12-02 DIAGNOSIS — E785 Hyperlipidemia, unspecified: Secondary | ICD-10-CM | POA: Diagnosis not present

## 2021-12-02 DIAGNOSIS — D0511 Intraductal carcinoma in situ of right breast: Secondary | ICD-10-CM | POA: Diagnosis not present

## 2021-12-02 DIAGNOSIS — Z7982 Long term (current) use of aspirin: Secondary | ICD-10-CM | POA: Diagnosis not present

## 2021-12-02 DIAGNOSIS — E119 Type 2 diabetes mellitus without complications: Secondary | ICD-10-CM | POA: Insufficient documentation

## 2021-12-02 DIAGNOSIS — K219 Gastro-esophageal reflux disease without esophagitis: Secondary | ICD-10-CM | POA: Diagnosis not present

## 2021-12-02 DIAGNOSIS — M1990 Unspecified osteoarthritis, unspecified site: Secondary | ICD-10-CM | POA: Diagnosis not present

## 2021-12-02 DIAGNOSIS — I10 Essential (primary) hypertension: Secondary | ICD-10-CM | POA: Diagnosis not present

## 2021-12-02 NOTE — Telephone Encounter (Signed)
Left patient a voicemail regarding her Mammogram being scheduled for November 14th at 5:00pm with [facility] imaging. Gave callback # [phone] if patient has any questions or concerns.

## 2021-12-08 ENCOUNTER — Encounter: Payer: Self-pay | Admitting: Internal Medicine

## 2021-12-09 DIAGNOSIS — J3081 Allergic rhinitis due to animal (cat) (dog) hair and dander: Secondary | ICD-10-CM | POA: Diagnosis not present

## 2021-12-09 DIAGNOSIS — J3089 Other allergic rhinitis: Secondary | ICD-10-CM | POA: Diagnosis not present

## 2021-12-09 DIAGNOSIS — J301 Allergic rhinitis due to pollen: Secondary | ICD-10-CM | POA: Diagnosis not present

## 2021-12-10 ENCOUNTER — Telehealth: Payer: Self-pay

## 2021-12-10 NOTE — Patient Outreach (Signed)
  Care Coordination   Initial Visit Note   12/10/2021 Name: Darlene Mclaughlin MRN: [id number] DOB: [birth year]  Darlene Mclaughlin is a 71 y.o. year old female who sees Darlene Mclaughlin, Darlene Halsted, MD for primary care. I spoke with  Darlene Mclaughlin by phone today.  What matters to the patients health and wellness today?  No concerns today    Goals Addressed             This Visit's Progress    COMPLETED: Care Coordination Activities - no follow up required       Care Coordination Interventions: Provided education to patient re: Annual Wellness Visit, care coordination services Assessed social determinant of health barriers          SDOH assessments and interventions completed:  Yes  SDOH Interventions Today    Flowsheet Row Most Recent Value  SDOH Interventions   Food Insecurity Interventions Intervention Not Indicated  Housing Interventions Intervention Not Indicated  Transportation Interventions Intervention Not Indicated  Utilities Interventions Intervention Not Indicated        Care Coordination Interventions Activated:  Yes  Care Coordination Interventions:  Yes, provided   Follow up plan: No further intervention required.   Encounter Outcome:  Pt. Visit Completed {THN Tip this will not be part of the note when signed-REQUIRED REPORT FIELD DO NOT DELETE (Optional):[id number] Peter Garter RN, BSN,CCM, [facility] [phone]

## 2021-12-10 NOTE — Patient Instructions (Signed)
Visit Information  Thank you for taking time to visit with me today. Please don't hesitate to contact me if I can be of assistance to you.   Following are the goals we discussed today:   Goals Addressed             This Visit's Progress    COMPLETED: Care Coordination Activities - no follow up required       Care Coordination Interventions: Provided education to patient re: Annual Wellness Visit, care coordination services Assessed social determinant of health barriers          If you are experiencing a Mental Health or [place] or need someone to talk to, please call the Suicide and Crisis Lifeline: 988 call the [place] National Suicide Prevention Lifeline: (226)474-7684 or TTY: 854-450-4728 TTY 250-103-2546) to talk to a trained counselor call 1-800-273-TALK (toll free, 24 hour hotline) go to [facility] [address], [place] [phone]) call 911   The patient verbalized understanding of instructions, educational materials, and care plan provided today and DECLINED offer to receive copy of patient instructions, educational materials, and care plan.   No further follow up required:   Peter Garter RN, [place]quline [place], [place] [facility] [phone]

## 2021-12-14 ENCOUNTER — Other Ambulatory Visit: Payer: Self-pay | Admitting: Internal Medicine

## 2021-12-17 DIAGNOSIS — J3081 Allergic rhinitis due to animal (cat) (dog) hair and dander: Secondary | ICD-10-CM | POA: Diagnosis not present

## 2021-12-17 DIAGNOSIS — J3089 Other allergic rhinitis: Secondary | ICD-10-CM | POA: Diagnosis not present

## 2021-12-17 DIAGNOSIS — J301 Allergic rhinitis due to pollen: Secondary | ICD-10-CM | POA: Diagnosis not present

## 2021-12-23 DIAGNOSIS — J3089 Other allergic rhinitis: Secondary | ICD-10-CM | POA: Diagnosis not present

## 2021-12-23 DIAGNOSIS — J3081 Allergic rhinitis due to animal (cat) (dog) hair and dander: Secondary | ICD-10-CM | POA: Diagnosis not present

## 2021-12-23 DIAGNOSIS — J301 Allergic rhinitis due to pollen: Secondary | ICD-10-CM | POA: Diagnosis not present

## 2021-12-24 ENCOUNTER — Encounter: Payer: Self-pay | Admitting: Internal Medicine

## 2021-12-24 ENCOUNTER — Ambulatory Visit: Payer: Medicare PPO | Admitting: Internal Medicine

## 2021-12-24 VITALS — BP 120/70 | HR 94 | Temp 98.2°F | Wt 254.2 lb

## 2021-12-24 DIAGNOSIS — Z789 Other specified health status: Secondary | ICD-10-CM

## 2021-12-24 DIAGNOSIS — E119 Type 2 diabetes mellitus without complications: Secondary | ICD-10-CM | POA: Diagnosis not present

## 2021-12-24 DIAGNOSIS — I10 Essential (primary) hypertension: Secondary | ICD-10-CM | POA: Diagnosis not present

## 2021-12-24 DIAGNOSIS — E785 Hyperlipidemia, unspecified: Secondary | ICD-10-CM

## 2021-12-24 DIAGNOSIS — Z23 Encounter for immunization: Secondary | ICD-10-CM

## 2021-12-24 NOTE — Progress Notes (Signed)
Established Patient Office Visit     CC/Reason for Visit: Follow-up chronic conditions  HPI: Darlene Mclaughlin is a 71 y.o. female who is coming in today for the above mentioned reasons. Past Medical History is significant for: Hypertension, hyperlipidemia with severe statin intolerance, type 2 diabetes, morbid obesity, lumbar spinal stenosis status post surgery in 2022.  She is doing well today and has no acute concerns or complaints.   Requesting flu vaccine today.   Past Medical/Surgical History: Past Medical History:  Diagnosis Date   ALLERGIC RHINITIS 05/15/2008   Allergy    Anemia    ASTHMA 05/15/2008   Asthma    Blood transfusion without reported diagnosis 1991   anemia   Breast cancer ([place]) 2016   right breast   Cancer ([place])    DCIS right breast   Diabetes mellitus without complication ([place])    GERD (gastroesophageal reflux disease)    Headache(784.0) 05/15/2008   Hyperlipidemia    HYPERTENSION 05/15/2008   IMPAIRED GLUCOSE TOLERANCE 05/15/2008   Obesity    OSTEOARTHRITIS 05/15/2008   back    Past Surgical History:  Procedure Laterality Date   ABDOMINAL HYSTERECTOMY     ANTERIOR LAT LUMBAR FUSION Left 05/21/2020   Procedure: LUMBAR TWO-THREE, LUMBAR THREE-FOUR DIRECT LATERAL LUMBAR INTERBODY FUSION;  Surgeon: Vallarie Mare, MD;  Location: [place];  Service: Neurosurgery;  Laterality: Left;   APPLICATION OF ROBOTIC ASSISTANCE FOR SPINAL PROCEDURE N/A 05/21/2020   Procedure: APPLICATION OF ROBOTIC ASSISTANCE FOR SPINAL PROCEDURE;  Surgeon: Vallarie Mare, MD;  Location: [place];  Service: Neurosurgery;  Laterality: N/A;   BREAST LUMPECTOMY Right 12/17/2014   BREAST LUMPECTOMY WITH RADIOACTIVE SEED LOCALIZATION Right 12/17/2014   Procedure: RIGHT BREAST RADIOACTIVE SEED GUIDED PARTIAL MASTECTOMY;  Surgeon: Erroll Luna, MD;  Location: [place];  Service: General;  Laterality: Right;   CESAREAN SECTION     x2   COLONOSCOPY     KNEE  SURGERY     arthroscopic left   TRANSFORAMINAL LUMBAR INTERBODY FUSION W/ MIS 2 LEVEL Right 05/21/2020   Procedure: LUMBAR FOUR-FIVE, LUMBAR FIVE-SACRAL ONE MINIMALLY INVASIVE TRANSFORAMINAL LUMBAR INTERBODY FUSION WITH INSTRUMENTATION LUMBAR TWO-SACRAL ONE;  Surgeon: Vallarie Mare, MD;  Location: [place];  Service: Neurosurgery;  Laterality: Right;    Social History:  reports that she has never smoked. She has never used smokeless tobacco. She reports current alcohol use. She reports that she does not use drugs.  Allergies: Allergies  Allergen Reactions   Atorvastatin     Hallucinations    Hydrochlorothiazide     Muscle pain   Hydrocodone     Swollen tongue   Macrobid [Nitrofurantoin Monohyd Macro]      Liver problems   Naproxen Swelling    Mouth and tongue swelling   Simvastatin     Hallucinations, muscle pains   Zanaflex [Tizanidine Hcl] Other (See Comments)     Liver problems    Family History:  Family History  Problem Relation Age of Onset   Diabetes Mother    Hypertension Mother    Breast cancer Mother 13       Breast cancer twice    Hyperlipidemia Father    Colon cancer Brother        12 th brother   Diabetes Brother    Pancreatic cancer Brother        middle brother   Stomach cancer Neg Hx    Esophageal cancer Neg Hx    Rectal  cancer Neg Hx      Current Outpatient Medications:    Accu-Chek FastClix Lancets MISC, 1 EACH BY DOES NOT APPLY ROUTE DAILY. USE FOR ACCU-CHEK GUIDE, Disp: 102 each, Rfl: 3   ACCU-CHEK GUIDE test strip, USE AS DIRECTED, Disp: 100 strip, Rfl: 3   acetaminophen (TYLENOL) 650 MG CR tablet, Take 650 mg by mouth every 8 (eight) hours as needed for pain., Disp: , Rfl:    aspirin 81 MG tablet, Take 81 mg by mouth daily., Disp: , Rfl:    azelastine (OPTIVAR) 0.05 % ophthalmic solution, Place 1 drop into both eyes daily as needed (allergies)., Disp: , Rfl:    Blood Glucose Monitoring Suppl (ACCU-CHEK GUIDE ME) w/Device KIT, USE TO CHECK  BLOOD SUGAR ONCE DAILY, Disp: 1 kit, Rfl: 1   CALCIUM PO, Take 1,000 mg by mouth daily., Disp: , Rfl:    cetirizine (ZYRTEC) 10 MG tablet, Take 10 mg by mouth daily as needed for allergies., Disp: , Rfl:    chlorthalidone (HYGROTON) 25 MG tablet, TAKE 1 TABLET BY MOUTH EVERY DAY, Disp: 90 tablet, Rfl: 1   cyclobenzaprine (FLEXERIL) 10 MG tablet, Take 1 tablet (10 mg total) by mouth 3 (three) times daily as needed for muscle spasms., Disp: 60 tablet, Rfl: 0   Docusate Sodium (DSS) 100 MG CAPS, Take 1 capsule by mouth 2 (two) times daily., Disp: , Rfl:    EPIPEN 2-PAK 0.3 MG/0.3ML SOAJ injection, Inject 0.3 mg into the muscle as needed for anaphylaxis., Disp: , Rfl: 1   fluticasone (FLONASE) 50 MCG/ACT nasal spray, Place 1 spray into both nostrils daily. (Patient taking differently: Place 1 spray into both nostrils daily as needed for allergies.), Disp: 16 g, Rfl: 5   KLOR-CON M20 20 MEQ tablet, TAKE 1 TABLET BY MOUTH TWICE A DAY, Disp: 180 tablet, Rfl: 1   Melatonin 10 MG TABS, Take 10 mg by mouth at bedtime as needed (sleep)., Disp: , Rfl:    metFORMIN (GLUCOPHAGE-XR) 500 MG 24 hr tablet, TAKE 2 TABLETS (1,000 MG TOTAL) BY MOUTH DAILY WITH SUPPER, Disp: 180 tablet, Rfl: 1   Multiple Vitamin (MULTIVITAMIN) tablet, Take 1 tablet by mouth daily., Disp: , Rfl:    NON FORMULARY, EVERY OTHER WEEK ALLERGY INJECTIONS, Disp: , Rfl:    PROAIR HFA 108 (90 BASE) MCG/ACT inhaler, Inhale 1 puff into the lungs every 6 (six) hours as needed for shortness of breath or wheezing., Disp: , Rfl: 0   Simethicone (GAS-X PO), Take 1 tablet by mouth daily as needed (gas)., Disp: , Rfl:    traMADol (ULTRAM) 50 MG tablet, Take 1 tablet by mouth every 6 (six) hours as needed., Disp: , Rfl:   Review of Systems:  Constitutional: Denies fever, chills, diaphoresis, appetite change and fatigue.  HEENT: Denies photophobia, eye pain, redness, hearing loss, ear pain, congestion, sore throat, rhinorrhea, sneezing, mouth sores,  trouble swallowing, neck pain, neck stiffness and tinnitus.   Respiratory: Denies SOB, DOE, cough, chest tightness,  and wheezing.   Cardiovascular: Denies chest pain, palpitations and leg swelling.  Gastrointestinal: Denies nausea, vomiting, abdominal pain, diarrhea, constipation, blood in stool and abdominal distention.  Genitourinary: Denies dysuria, urgency, frequency, hematuria, flank pain and difficulty urinating.  Endocrine: Denies: hot or cold intolerance, sweats, changes in hair or nails, polyuria, polydipsia. Musculoskeletal: Denies myalgias, back pain, joint swelling, arthralgias and gait problem.  Skin: Denies pallor, rash and wound.  Neurological: Denies dizziness, seizures, syncope, weakness, light-headedness, numbness and headaches.  Hematological: Denies adenopathy. Easy  bruising, personal or family bleeding history  Psychiatric/Behavioral: Denies suicidal ideation, mood changes, confusion, nervousness, sleep disturbance and agitation    Physical Exam: Vitals:   12/24/21 1110  BP: 120/70  Pulse: 94  Temp: 98.2 F (36.8 C)  TempSrc: Oral  SpO2: 96%  Weight: 254 lb 3.2 oz (115.3 kg)    Body mass index is 43.63 kg/m.   Constitutional: NAD, calm, comfortable Eyes: PERRL, lids and conjunctivae normal, wears corrective lenses ENMT: Mucous membranes are moist.  Respiratory: clear to auscultation bilaterally, no wheezing, no crackles. Normal respiratory effort. No accessory muscle use.  Cardiovascular: Regular rate and rhythm, no murmurs / rubs / gallops. No extremity edema.  Psychiatric: Normal judgment and insight. Alert and oriented x 3. Normal mood.    Impression and Plan:  Diabetes mellitus with coincident hypertension ([place])  Essential hypertension  Dyslipidemia  Statin intolerance  Need for influenza vaccination  -Recent A1c was 6.8 which demonstrates good diabetic control. -BP is well controlled. -LDL is suboptimal but she has a severe statin  intolerance. -Flu vaccine given in office today.  Time spent:30 minutes reviewing chart, interviewing and examining patient and formulating plan of care.       Lelon Frohlich, MD [facility]

## 2021-12-30 DIAGNOSIS — J301 Allergic rhinitis due to pollen: Secondary | ICD-10-CM | POA: Diagnosis not present

## 2021-12-30 DIAGNOSIS — J3089 Other allergic rhinitis: Secondary | ICD-10-CM | POA: Diagnosis not present

## 2021-12-30 DIAGNOSIS — J3081 Allergic rhinitis due to animal (cat) (dog) hair and dander: Secondary | ICD-10-CM | POA: Diagnosis not present

## 2022-01-04 DIAGNOSIS — Z6841 Body Mass Index (BMI) 40.0 and over, adult: Secondary | ICD-10-CM | POA: Diagnosis not present

## 2022-01-04 DIAGNOSIS — M48061 Spinal stenosis, lumbar region without neurogenic claudication: Secondary | ICD-10-CM | POA: Diagnosis not present

## 2022-01-05 ENCOUNTER — Ambulatory Visit
Admission: RE | Admit: 2022-01-05 | Discharge: 2022-01-05 | Disposition: A | Discharge status: Home / Self Care | Payer: Medicare PPO | Source: Ambulatory Visit | Attending: Nurse Practitioner | Admitting: Nurse Practitioner

## 2022-01-05 DIAGNOSIS — Z1231 Encounter for screening mammogram for malignant neoplasm of breast: Secondary | ICD-10-CM | POA: Diagnosis not present

## 2022-01-05 DIAGNOSIS — D0511 Intraductal carcinoma in situ of right breast: Secondary | ICD-10-CM

## 2022-01-07 ENCOUNTER — Other Ambulatory Visit: Payer: Self-pay | Admitting: Internal Medicine

## 2022-01-22 DIAGNOSIS — J3081 Allergic rhinitis due to animal (cat) (dog) hair and dander: Secondary | ICD-10-CM | POA: Diagnosis not present

## 2022-01-22 DIAGNOSIS — J3089 Other allergic rhinitis: Secondary | ICD-10-CM | POA: Diagnosis not present

## 2022-01-22 DIAGNOSIS — J301 Allergic rhinitis due to pollen: Secondary | ICD-10-CM | POA: Diagnosis not present

## 2022-01-25 ENCOUNTER — Ambulatory Visit (INDEPENDENT_AMBULATORY_CARE_PROVIDER_SITE_OTHER): Payer: Medicare PPO | Admitting: Podiatry

## 2022-01-25 VITALS — BP 132/72

## 2022-01-25 DIAGNOSIS — B351 Tinea unguium: Secondary | ICD-10-CM

## 2022-01-25 DIAGNOSIS — M2042 Other hammer toe(s) (acquired), left foot: Secondary | ICD-10-CM

## 2022-01-25 DIAGNOSIS — M79675 Pain in left toe(s): Secondary | ICD-10-CM | POA: Diagnosis not present

## 2022-01-25 DIAGNOSIS — M79674 Pain in right toe(s): Secondary | ICD-10-CM | POA: Diagnosis not present

## 2022-01-25 DIAGNOSIS — M2012 Hallux valgus (acquired), left foot: Secondary | ICD-10-CM | POA: Diagnosis not present

## 2022-01-25 DIAGNOSIS — M2041 Other hammer toe(s) (acquired), right foot: Secondary | ICD-10-CM

## 2022-01-25 DIAGNOSIS — I10 Essential (primary) hypertension: Secondary | ICD-10-CM

## 2022-01-25 DIAGNOSIS — M2142 Flat foot [pes planus] (acquired), left foot: Secondary | ICD-10-CM

## 2022-01-25 DIAGNOSIS — M2141 Flat foot [pes planus] (acquired), right foot: Secondary | ICD-10-CM | POA: Diagnosis not present

## 2022-01-25 DIAGNOSIS — E119 Type 2 diabetes mellitus without complications: Secondary | ICD-10-CM | POA: Diagnosis not present

## 2022-01-25 DIAGNOSIS — M2011 Hallux valgus (acquired), right foot: Secondary | ICD-10-CM

## 2022-01-25 NOTE — Progress Notes (Signed)
ANNUAL DIABETIC FOOT EXAM  Subjective: Darlene Mclaughlin presents today for annual diabetic foot examination.  Chief Complaint  Patient presents with   Nail Problem    DFC BS- did not check today A1C-6.? Garnette Gunner PCP VST-12/2021   Patient confirms h/o diabetes.  Patient relates 17 year h/o diabetes.  Patient denies any h/o foot wounds.  Patient denies any numbness, tingling, burning, or pins/needle sensation in feet.  Risk factors: diabetes, HTN, hyperlipidemia, dyslipidemia.  Isaac Bliss, Rayford Halsted, MD is patient's PCP.  Patient states her left great toe is tender again. She denies any redness, drainage or swelling, but it feels ingrown again.  Past Medical History:  Diagnosis Date   ALLERGIC RHINITIS 05/15/2008   Allergy    Anemia    ASTHMA 05/15/2008   Asthma    Blood transfusion without reported diagnosis 1991   anemia   Breast cancer ([place]) 2016   right breast   Cancer ([place])    DCIS right breast   Diabetes mellitus without complication ([place])    GERD (gastroesophageal reflux disease)    Headache(784.0) 05/15/2008   Hyperlipidemia    HYPERTENSION 05/15/2008   IMPAIRED GLUCOSE TOLERANCE 05/15/2008   Obesity    OSTEOARTHRITIS 05/15/2008   back   Patient Active Problem List   Diagnosis Date Noted   Closed fracture of lateral malleolus of right fibula 02/20/2021   Lumbar radiculopathy 05/21/2020   Statin intolerance 01/16/2018   Bilateral leg edema 12/21/2017   Bilateral lower extremity edema 12/21/2017   History of colonic polyps 10/18/2017   Dyslipidemia 10/18/2017   History of colon polyps 10/18/2017   Essential hypertension 03/26/2016   Family history of colon cancer 03/26/2016   Osteopenia 08/28/2015   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   Spinal stenosis of lumbar region 12/05/2013   Morbid obesity ([place]) 12/19/2012   Diabetes mellitus with coincident hypertension ([place]) 05/15/2008   Allergic rhinitis  05/15/2008   Asthma, mild intermittent 05/15/2008   Osteoarthritis 05/15/2008   HEADACHE 05/15/2008   Past Surgical History:  Procedure Laterality Date   ABDOMINAL HYSTERECTOMY     ANTERIOR LAT LUMBAR FUSION Left 05/21/2020   Procedure: LUMBAR TWO-THREE, LUMBAR THREE-FOUR DIRECT LATERAL LUMBAR INTERBODY FUSION;  Surgeon: Vallarie Mare, MD;  Location: [facility];  Service: Neurosurgery;  Laterality: Left;   APPLICATION OF ROBOTIC ASSISTANCE FOR SPINAL PROCEDURE N/A 05/21/2020   Procedure: APPLICATION OF ROBOTIC ASSISTANCE FOR SPINAL PROCEDURE;  Surgeon: Vallarie Mare, MD;  Location: [facility];  Service: Neurosurgery;  Laterality: N/A;   BREAST LUMPECTOMY Right 12/17/2014   BREAST LUMPECTOMY WITH RADIOACTIVE SEED LOCALIZATION Right 12/17/2014   Procedure: RIGHT BREAST RADIOACTIVE SEED GUIDED PARTIAL MASTECTOMY;  Surgeon: Erroll Luna, MD;  Location: [facility];  Service: General;  Laterality: Right;   CESAREAN SECTION     x2   COLONOSCOPY     KNEE SURGERY     arthroscopic left   TRANSFORAMINAL LUMBAR INTERBODY FUSION W/ MIS 2 LEVEL Right 05/21/2020   Procedure: LUMBAR FOUR-FIVE, LUMBAR FIVE-SACRAL ONE MINIMALLY INVASIVE TRANSFORAMINAL LUMBAR INTERBODY FUSION WITH INSTRUMENTATION LUMBAR TWO-SACRAL ONE;  Surgeon: Vallarie Mare, MD;  Location: [facility];  Service: Neurosurgery;  Laterality: Right;   Current Outpatient Medications on File Prior to Visit  Medication Sig Dispense Refill   Accu-Chek FastClix Lancets MISC 1 EACH BY DOES NOT APPLY ROUTE DAILY. USE FOR ACCU-CHEK GUIDE 102 each 3   ACCU-CHEK GUIDE test strip USE AS DIRECTED 100 strip 3  acetaminophen (TYLENOL) 650 MG CR tablet Take 650 mg by mouth every 8 (eight) hours as needed for pain.     aspirin 81 MG tablet Take 81 mg by mouth daily.     azelastine (OPTIVAR) 0.05 % ophthalmic solution Place 1 drop into both eyes daily as needed (allergies).     Blood Glucose Monitoring Suppl (ACCU-CHEK GUIDE ME) w/Device  KIT USE TO CHECK BLOOD SUGAR ONCE DAILY 1 kit 1   CALCIUM PO Take 1,000 mg by mouth daily.     cetirizine (ZYRTEC) 10 MG tablet Take 10 mg by mouth daily as needed for allergies.     chlorthalidone (HYGROTON) 25 MG tablet TAKE 1 TABLET BY MOUTH EVERY DAY 90 tablet 1   cyclobenzaprine (FLEXERIL) 10 MG tablet Take 1 tablet (10 mg total) by mouth 3 (three) times daily as needed for muscle spasms. 60 tablet 0   Docusate Sodium (DSS) 100 MG CAPS Take 1 capsule by mouth 2 (two) times daily.     EPIPEN 2-PAK 0.3 MG/0.3ML SOAJ injection Inject 0.3 mg into the muscle as needed for anaphylaxis.  1   fluticasone (FLONASE) 50 MCG/ACT nasal spray Place 1 spray into both nostrils daily. (Patient taking differently: Place 1 spray into both nostrils daily as needed for allergies.) 16 g 5   KLOR-CON M20 20 MEQ tablet TAKE 1 TABLET BY MOUTH TWICE A DAY 180 tablet 1   Melatonin 10 MG TABS Take 10 mg by mouth at bedtime as needed (sleep).     metFORMIN (GLUCOPHAGE-XR) 500 MG 24 hr tablet TAKE 2 TABLETS (1,000 MG TOTAL) BY MOUTH DAILY WITH SUPPER 180 tablet 1   Multiple Vitamin (MULTIVITAMIN) tablet Take 1 tablet by mouth daily.     NON FORMULARY EVERY OTHER WEEK ALLERGY INJECTIONS     PROAIR HFA 108 (90 BASE) MCG/ACT inhaler Inhale 1 puff into the lungs every 6 (six) hours as needed for shortness of breath or wheezing.  0   Simethicone (GAS-X PO) Take 1 tablet by mouth daily as needed (gas).     traMADol (ULTRAM) 50 MG tablet Take 1 tablet by mouth every 6 (six) hours as needed.     No current facility-administered medications on file prior to visit.    Allergies  Allergen Reactions   Atorvastatin     Hallucinations    Hydrochlorothiazide     Muscle pain   Hydrocodone     Swollen tongue   Macrobid [Nitrofurantoin Monohyd Macro]      Liver problems   Naproxen Swelling    Mouth and tongue swelling   Simvastatin     Hallucinations, muscle pains   Zanaflex [Tizanidine Hcl] Other (See Comments)     Liver  problems   Social History   Occupational History   Occupation: Retired  Tobacco Use   Smoking status: Never   Smokeless tobacco: Never  Vaping Use   Vaping Use: Never used  Substance and Sexual Activity   Alcohol use: Yes    Comment: RARELY   Drug use: No   Sexual activity: Not on file   Family History  Problem Relation Age of Onset   Diabetes Mother    Hypertension Mother    Breast cancer Mother 46       Breast cancer twice    Hyperlipidemia Father    Colon cancer Brother        69 th brother   Diabetes Brother    Pancreatic cancer Brother  middle brother   Stomach cancer Neg Hx    Esophageal cancer Neg Hx    Rectal cancer Neg Hx    Immunization History  Administered Date(s) Administered   Fluad Quad(high Dose 65+) 11/28/2019, 12/24/2021   Influenza Whole 12/21/2011   Influenza, High Dose Seasonal PF 12/20/2014, 12/09/2015, 12/10/2016, 10/12/2018, 12/15/2018, 12/11/2020   Influenza,inj,Quad PF,6+ Mos 12/19/2012, 12/05/2013, 11/26/2014, 12/01/2017   Influenza-Unspecified 11/01/2016   PFIZER Comirnaty(Gray Top)Covid-19 Tri-Sucrose Vaccine 09/08/2020   PFIZER(Purple Top)SARS-COV-2 Vaccination 03/29/2019, 04/24/2019, 08/13/2019, 12/19/2019   PPD Test 10/11/2011   Pfizer Covid-19 Vaccine Bivalent Booster 67yr & up 05/15/2021   Pneumococcal Conjugate-13 12/05/2013   Pneumococcal Polysaccharide-23 12/20/2014, 01/29/2015, 10/15/2015, 10/25/2017, 12/15/2018, 08/13/2019, 08/13/2020   Tdap 06/04/2013    Review of Systems: Negative except as noted in the HPI.   Objective: Vitals:   01/25/22 1322  BP: 132/72   Darlene GAVELis a pleasant 71y.o. female in NAD. AAO X 3.  Vascular Examination: Capillary refill time immediate b/l. Vascular status intact b/l with palpable pedal pulses. Pedal hair present b/l. No edema. No pain with calf compression b/l. Skin temperature gradient WNL b/l. No cyanosis or clubbing noted b/l LE.  Neurological Examination: Sensation  grossly intact b/l with 10 gram monofilament. Vibratory sensation intact b/l.   Dermatological Examination: Pedal skin with normal turgor, texture and tone b/l. Toenails 1-5 b/l thick, discolored, elongated with subungual debris and pain on dorsal palpation.   Incurvated nailplate lateral border left hallux.  Nail border hypertrophy absent. There is tenderness to palpation. Sign(s) of infection: no clinical signs of infection noted on examination today..  Musculoskeletal Examination: Normal muscle strength 5/5 to all lower extremity muscle groups bilaterally. HAV with bunion bilaterally and hammertoes 2-5 b/l. Pes planus deformity noted bilateral LE..Marland KitchenNo pain, crepitus or joint limitation noted with ROM b/l LE.  Patient ambulates independently without assistive aids.  Radiographs: None  Last A1c:      Latest Ref Rng & Units 10/20/2021    9:31 AM 06/30/2021   10:37 AM 04/02/2021    9:31 AM  Hemoglobin A1C  Hemoglobin-A1c 4.0 - 5.6 % 6.8  6.6  6.6    Footwear Assessment: Does the patient wear appropriate shoes? Yes. Does the patient need inserts/orthotics? No.  ADA Risk Categorization: Low Risk :  Patient has all of the following: Intact protective sensation No prior foot ulcer  No severe deformity Pedal pulses present  Assessment: 1. Pain due to onychomycosis of toenails of both feet   2. Hallux valgus, acquired, bilateral   3. Acquired hammertoes of both feet   4. Pes planus of both feet   5. Diabetes mellitus with coincident hypertension ([place]   6. Encounter for diabetic foot exam ([place]    Plan: No orders of the defined types were placed in this encounter.  -Consent given for treatment as described below: -Examined patient. -Diabetic foot examination performed today. -Patient to continue soft, supportive shoe gear daily. -Mycotic toenails 1-5 bilaterally were debrided in length and girth with sterile nail nippers and dremel without incident. -Discussed chronicity of  ingrown toenail(s) of left great toe. Recommended patient consider having matrixectomy performed to alleviate chronic ingrown toenail(s). Discussed in-office procedure and post-procedure instructions. Patient agrees and will be referred to Dr. MSherryle Lisfor procedure. -No invasive procedure(s) performed. Offending nail border debrided and curretaged left great toe utilizing sterile nail nipper and currette. Border cleansed with alcohol and triple antibiotic ointment. No further treatment required by patient/caregiver. Call office if there are  any concerns. -Patient/POA to call should there be question/concern in the interim. Return in about 3 months (around 04/26/2022).  Marzetta Board, DPM

## 2022-01-27 DIAGNOSIS — J3081 Allergic rhinitis due to animal (cat) (dog) hair and dander: Secondary | ICD-10-CM | POA: Diagnosis not present

## 2022-01-27 DIAGNOSIS — J3089 Other allergic rhinitis: Secondary | ICD-10-CM | POA: Diagnosis not present

## 2022-01-27 DIAGNOSIS — J301 Allergic rhinitis due to pollen: Secondary | ICD-10-CM | POA: Diagnosis not present

## 2022-01-29 ENCOUNTER — Encounter: Payer: Self-pay | Admitting: Podiatry

## 2022-02-03 DIAGNOSIS — J301 Allergic rhinitis due to pollen: Secondary | ICD-10-CM | POA: Diagnosis not present

## 2022-02-03 DIAGNOSIS — J3089 Other allergic rhinitis: Secondary | ICD-10-CM | POA: Diagnosis not present

## 2022-02-11 DIAGNOSIS — J3081 Allergic rhinitis due to animal (cat) (dog) hair and dander: Secondary | ICD-10-CM | POA: Diagnosis not present

## 2022-02-11 DIAGNOSIS — J3089 Other allergic rhinitis: Secondary | ICD-10-CM | POA: Diagnosis not present

## 2022-02-11 DIAGNOSIS — J301 Allergic rhinitis due to pollen: Secondary | ICD-10-CM | POA: Diagnosis not present

## 2022-02-19 DIAGNOSIS — J301 Allergic rhinitis due to pollen: Secondary | ICD-10-CM | POA: Diagnosis not present

## 2022-02-19 DIAGNOSIS — J3089 Other allergic rhinitis: Secondary | ICD-10-CM | POA: Diagnosis not present

## 2022-02-23 ENCOUNTER — Ambulatory Visit: Payer: Medicare PPO | Admitting: Podiatry

## 2022-02-23 DIAGNOSIS — L600 Ingrowing nail: Secondary | ICD-10-CM | POA: Diagnosis not present

## 2022-02-23 MED ORDER — NEOMYCIN-POLYMYXIN-HC 3.5-10000-1 OT SUSP: OTIC | 0 refills | Status: DC

## 2022-02-23 NOTE — Progress Notes (Signed)
  Subjective:  Patient ID: Darlene Mclaughlin, female    DOB: May 22, 1950,  MRN: [id number]  Chief Complaint  Patient presents with   Ingrown Toenail    ingrown toenail removal left great toe    72 y.o. female presents with the above complaint. History confirmed with patient.  She complains of pain from the lateral edge not on the medial side.  No signs of infection recently  Objective:  Physical Exam: warm, good capillary refill, no trophic changes or ulcerative lesions, normal DP and PT pulses, normal sensory exam, and ingrowing painful lateral hallux nail border.  Assessment:   1. Ingrowing left great toenail      Plan:  Patient was evaluated and treated and all questions answered.    Ingrown Nail, left -Patient elects to proceed with minor surgery to remove ingrown toenail today. Consent reviewed and signed by patient. -Ingrown nail excised. See procedure note. -Educated on post-procedure care including soaking. Written instructions provided and reviewed. -Rx for Cortisporin sent to pharmacy. -Advised on signs and symptoms of infection developing.  We discussed that the phenol likely will create some redness and edema and tenderness around the nailbed as long as it is localized this is to be expected.  Will return in 3 weeks with nurse to evaluate healing  Procedure: Excision of Ingrown Toenail Location: Left 1st toe lateral nail borders. Anesthesia: Lidocaine 1% plain; 1.5 mL and Marcaine 0.5% plain; 1.5 mL, digital block. Skin Prep: Betadine. Dressing: Silvadene; telfa; dry, sterile, compression dressing. Technique: Following skin prep, the toe was exsanguinated and a tourniquet was secured at the base of the toe. The affected nail border was freed, split with a nail splitter, and excised. Chemical matrixectomy was then performed with phenol and irrigated out with alcohol. The tourniquet was then removed and sterile dressing applied. Disposition: Patient tolerated procedure  well.    Return in about 3 weeks (around 03/16/2022) for nail re-check.

## 2022-02-23 NOTE — Patient Instructions (Signed)

## 2022-03-05 DIAGNOSIS — J301 Allergic rhinitis due to pollen: Secondary | ICD-10-CM | POA: Diagnosis not present

## 2022-03-05 DIAGNOSIS — J3089 Other allergic rhinitis: Secondary | ICD-10-CM | POA: Diagnosis not present

## 2022-03-05 DIAGNOSIS — J3081 Allergic rhinitis due to animal (cat) (dog) hair and dander: Secondary | ICD-10-CM | POA: Diagnosis not present

## 2022-03-16 ENCOUNTER — Ambulatory Visit (INDEPENDENT_AMBULATORY_CARE_PROVIDER_SITE_OTHER): Payer: Medicare PPO

## 2022-03-16 VITALS — BP 136/88 | HR 79

## 2022-03-16 DIAGNOSIS — L600 Ingrowing nail: Secondary | ICD-10-CM

## 2022-03-16 NOTE — Progress Notes (Signed)
Patient in office today for left hallux nail check after ingrown toenail removal of the lateral border.   Patient denies nausea, vomiting, fever, chills and drainage at this time. Patient states her toenail feels much better and she has not had any problems since the procedure.  Advised patient to continue soaking in epsom salts twice a day followed by antibiotic drops and a band-aid. Can leave uncovered at night. Continue this until completely healed and a scab has formed.  If the area has not completely healed in 2 weeks, call the office for follow-up appointment, or sooner if any problems arise.  Monitor for any signs/symptoms of infection. Call the office immediately if any occur or go directly to the emergency room. Call with any questions/concerns.

## 2022-03-19 DIAGNOSIS — J3089 Other allergic rhinitis: Secondary | ICD-10-CM | POA: Diagnosis not present

## 2022-03-19 DIAGNOSIS — J3081 Allergic rhinitis due to animal (cat) (dog) hair and dander: Secondary | ICD-10-CM | POA: Diagnosis not present

## 2022-03-19 DIAGNOSIS — J301 Allergic rhinitis due to pollen: Secondary | ICD-10-CM | POA: Diagnosis not present

## 2022-04-01 DIAGNOSIS — J3089 Other allergic rhinitis: Secondary | ICD-10-CM | POA: Diagnosis not present

## 2022-04-01 DIAGNOSIS — J301 Allergic rhinitis due to pollen: Secondary | ICD-10-CM | POA: Diagnosis not present

## 2022-04-06 ENCOUNTER — Ambulatory Visit (INDEPENDENT_AMBULATORY_CARE_PROVIDER_SITE_OTHER): Payer: Medicare PPO | Admitting: Internal Medicine

## 2022-04-06 ENCOUNTER — Encounter: Payer: Self-pay | Admitting: Internal Medicine

## 2022-04-06 VITALS — BP 124/74 | HR 99 | Temp 98.5°F | Ht 63.0 in | Wt 257.7 lb

## 2022-04-06 DIAGNOSIS — Z0000 Encounter for general adult medical examination without abnormal findings: Secondary | ICD-10-CM

## 2022-04-06 DIAGNOSIS — E785 Hyperlipidemia, unspecified: Secondary | ICD-10-CM

## 2022-04-06 DIAGNOSIS — M8580 Other specified disorders of bone density and structure, unspecified site: Secondary | ICD-10-CM

## 2022-04-06 DIAGNOSIS — Z124 Encounter for screening for malignant neoplasm of cervix: Secondary | ICD-10-CM | POA: Diagnosis not present

## 2022-04-06 DIAGNOSIS — I10 Essential (primary) hypertension: Secondary | ICD-10-CM | POA: Diagnosis not present

## 2022-04-06 DIAGNOSIS — D0511 Intraductal carcinoma in situ of right breast: Secondary | ICD-10-CM | POA: Diagnosis not present

## 2022-04-06 DIAGNOSIS — E119 Type 2 diabetes mellitus without complications: Secondary | ICD-10-CM

## 2022-04-06 DIAGNOSIS — Z789 Other specified health status: Secondary | ICD-10-CM | POA: Diagnosis not present

## 2022-04-06 LAB — LIPID PANEL
Cholesterol: 217 mg/dL — ABNORMAL HIGH (ref 0–200)
HDL: 51.8 mg/dL (ref >39.00)
LDL Cholesterol: 142 mg/dL — ABNORMAL HIGH (ref 0–99)
NonHDL: 164.86
Total CHOL/HDL Ratio: 4
Triglycerides: 113 mg/dL (ref 0.0–149.0)
VLDL: 22.6 mg/dL (ref 0.0–40.0)

## 2022-04-06 LAB — COMPREHENSIVE METABOLIC PANEL
ALT: 13 U/L (ref 0–35)
AST: 15 U/L (ref 0–37)
Albumin: 4.2 g/dL (ref 3.5–5.2)
Alkaline Phosphatase: 76 U/L (ref 39–117)
BUN: 14 mg/dL (ref 6–23)
CO2: 28 mEq/L (ref 19–32)
Calcium: 9.7 mg/dL (ref 8.4–10.5)
Chloride: 99 mEq/L (ref 96–112)
Creatinine, Ser: 0.61 mg/dL (ref 0.40–1.20)
GFR: 88.63 mL/min (ref >60.00)
Glucose, Bld: 165 mg/dL — ABNORMAL HIGH (ref 70–99)
Potassium: 3.5 mEq/L (ref 3.5–5.1)
Sodium: 138 mEq/L (ref 135–145)
Total Bilirubin: 0.4 mg/dL (ref 0.2–1.2)
Total Protein: 7.6 g/dL (ref 6.0–8.3)

## 2022-04-06 LAB — TSH: TSH: 2.39 u[IU]/mL (ref 0.35–5.50)

## 2022-04-06 LAB — CBC WITH DIFFERENTIAL/PLATELET
Basophils Absolute: 0 10*3/uL (ref 0.0–0.1)
Basophils Relative: 0.5 % (ref 0.0–3.0)
Eosinophils Absolute: 0.1 10*3/uL (ref 0.0–0.7)
Eosinophils Relative: 2.1 % (ref 0.0–5.0)
HCT: 39.2 % (ref 36.0–46.0)
Hemoglobin: 12.9 g/dL (ref 12.0–15.0)
Lymphocytes Relative: 27.9 % (ref 12.0–46.0)
Lymphs Abs: 1.7 10*3/uL (ref 0.7–4.0)
MCHC: 33.1 g/dL (ref 30.0–36.0)
MCV: 91.2 fl (ref 78.0–100.0)
Monocytes Absolute: 0.4 10*3/uL (ref 0.1–1.0)
Monocytes Relative: 7.1 % (ref 3.0–12.0)
Neutro Abs: 3.9 10*3/uL (ref 1.4–7.7)
Neutrophils Relative %: 62.4 % (ref 43.0–77.0)
Platelets: 189 10*3/uL (ref 150.0–400.0)
RBC: 4.29 Mil/uL (ref 3.87–5.11)
RDW: 14.3 % (ref 11.5–15.5)
WBC: 6.2 10*3/uL (ref 4.0–10.5)

## 2022-04-06 LAB — HEMOGLOBIN A1C: Hgb A1c MFr Bld: 8.1 % — ABNORMAL HIGH (ref 4.6–6.5)

## 2022-04-06 LAB — VITAMIN B12: Vitamin B-12: 344 pg/mL (ref 211–911)

## 2022-04-06 NOTE — Addendum Note (Signed)
Addended by: Westley Hummer B on: 04/06/2022 08:55 AM   Modules accepted: Orders

## 2022-04-06 NOTE — Progress Notes (Signed)
Established Patient Office Visit     CC/Reason for Visit: Annual preventive exam and subsequent Medicare wellness visit  HPI: Darlene Mclaughlin is a 72 y.o. female who is coming in today for the above mentioned reasons. Past Medical History is significant for: Hypertension, type 2 diabetes, hyperlipidemia with severe statin intolerance, morbid obesity and lumbar spinal stenosis status post surgery in 2022, she also has a remote history of breast cancer.  She is feeling well today and has no acute concerns or complaints.   Past Medical/Surgical History: Past Medical History:  Diagnosis Date   ALLERGIC RHINITIS 05/15/2008   Allergy    Anemia    ASTHMA 05/15/2008   Asthma    Blood transfusion without reported diagnosis 1991   anemia   Breast cancer ([place]) 2016   right breast   Cancer ([place])    DCIS right breast   Diabetes mellitus without complication ([place])    GERD (gastroesophageal reflux disease)    Headache(784.0) 05/15/2008   Hyperlipidemia    HYPERTENSION 05/15/2008   IMPAIRED GLUCOSE TOLERANCE 05/15/2008   Obesity    OSTEOARTHRITIS 05/15/2008   back    Past Surgical History:  Procedure Laterality Date   ABDOMINAL HYSTERECTOMY     ANTERIOR LAT LUMBAR FUSION Left 05/21/2020   Procedure: LUMBAR TWO-THREE, LUMBAR THREE-FOUR DIRECT LATERAL LUMBAR INTERBODY FUSION;  Surgeon: Vallarie Mare, MD;  Location: [facility];  Service: Neurosurgery;  Laterality: Left;   APPLICATION OF ROBOTIC ASSISTANCE FOR SPINAL PROCEDURE N/A 05/21/2020   Procedure: APPLICATION OF ROBOTIC ASSISTANCE FOR SPINAL PROCEDURE;  Surgeon: Vallarie Mare, MD;  Location: [facility];  Service: Neurosurgery;  Laterality: N/A;   BREAST LUMPECTOMY Right 12/17/2014   BREAST LUMPECTOMY WITH RADIOACTIVE SEED LOCALIZATION Right 12/17/2014   Procedure: RIGHT BREAST RADIOACTIVE SEED GUIDED PARTIAL MASTECTOMY;  Surgeon: Erroll Luna, MD;  Location: [facility];  Service: General;  Laterality: Right;    CESAREAN SECTION     x2   COLONOSCOPY     KNEE SURGERY     arthroscopic left   TRANSFORAMINAL LUMBAR INTERBODY FUSION W/ MIS 2 LEVEL Right 05/21/2020   Procedure: LUMBAR FOUR-FIVE, LUMBAR FIVE-SACRAL ONE MINIMALLY INVASIVE TRANSFORAMINAL LUMBAR INTERBODY FUSION WITH INSTRUMENTATION LUMBAR TWO-SACRAL ONE;  Surgeon: Vallarie Mare, MD;  Location: [facility];  Service: Neurosurgery;  Laterality: Right;    Social History:  reports that she has never smoked. She has never used smokeless tobacco. She reports current alcohol use. She reports that she does not use drugs.  Allergies: Allergies  Allergen Reactions   Atorvastatin     Hallucinations    Hydrochlorothiazide     Muscle pain   Hydrocodone     Swollen tongue   Macrobid [Nitrofurantoin Monohyd Macro]      Liver problems   Naproxen Swelling    Mouth and tongue swelling   Simvastatin     Hallucinations, muscle pains   Zanaflex [Tizanidine Hcl] Other (See Comments)     Liver problems    Family History:  Family History  Problem Relation Age of Onset   Diabetes Mother    Hypertension Mother    Breast cancer Mother 64       Breast cancer twice    Hyperlipidemia Father    Colon cancer Brother        11 th brother   Diabetes Brother    Pancreatic cancer Brother        middle brother   Stomach cancer Neg Hx  Esophageal cancer Neg Hx    Rectal cancer Neg Hx      Current Outpatient Medications:    Accu-Chek FastClix Lancets MISC, 1 EACH BY DOES NOT APPLY ROUTE DAILY. USE FOR ACCU-CHEK GUIDE, Disp: 102 each, Rfl: 3   ACCU-CHEK GUIDE test strip, USE AS DIRECTED, Disp: 100 strip, Rfl: 3   acetaminophen (TYLENOL) 650 MG CR tablet, Take 650 mg by mouth every 8 (eight) hours as needed for pain., Disp: , Rfl:    aspirin 81 MG tablet, Take 81 mg by mouth daily., Disp: , Rfl:    azelastine (OPTIVAR) 0.05 % ophthalmic solution, Place 1 drop into both eyes daily as needed (allergies)., Disp: , Rfl:    Blood Glucose Monitoring  Suppl (ACCU-CHEK GUIDE ME) w/Device KIT, USE TO CHECK BLOOD SUGAR ONCE DAILY, Disp: 1 kit, Rfl: 1   CALCIUM PO, Take 1,000 mg by mouth daily., Disp: , Rfl:    cetirizine (ZYRTEC) 10 MG tablet, Take 10 mg by mouth daily as needed for allergies., Disp: , Rfl:    chlorthalidone (HYGROTON) 25 MG tablet, TAKE 1 TABLET BY MOUTH EVERY DAY, Disp: 90 tablet, Rfl: 1   cyclobenzaprine (FLEXERIL) 10 MG tablet, Take 1 tablet (10 mg total) by mouth 3 (three) times daily as needed for muscle spasms., Disp: 60 tablet, Rfl: 0   Docusate Sodium (DSS) 100 MG CAPS, Take 1 capsule by mouth 2 (two) times daily., Disp: , Rfl:    EPIPEN 2-PAK 0.3 MG/0.3ML SOAJ injection, Inject 0.3 mg into the muscle as needed for anaphylaxis., Disp: , Rfl: 1   fluticasone (FLONASE) 50 MCG/ACT nasal spray, Place 1 spray into both nostrils daily. (Patient taking differently: Place 1 spray into both nostrils daily as needed for allergies.), Disp: 16 g, Rfl: 5   KLOR-CON M20 20 MEQ tablet, TAKE 1 TABLET BY MOUTH TWICE A DAY, Disp: 180 tablet, Rfl: 1   Melatonin 10 MG TABS, Take 10 mg by mouth at bedtime as needed (sleep)., Disp: , Rfl:    metFORMIN (GLUCOPHAGE-XR) 500 MG 24 hr tablet, TAKE 2 TABLETS (1,000 MG TOTAL) BY MOUTH DAILY WITH SUPPER, Disp: 180 tablet, Rfl: 1   Multiple Vitamin (MULTIVITAMIN) tablet, Take 1 tablet by mouth daily., Disp: , Rfl:    neomycin-polymyxin-hydrocortisone (CORTISPORIN) 3.5-10000-1 OTIC suspension, Apply 1-2 drops daily after soaking and cover with bandaid, Disp: 10 mL, Rfl: 0   NON FORMULARY, EVERY OTHER WEEK ALLERGY INJECTIONS, Disp: , Rfl:    PROAIR HFA 108 (90 BASE) MCG/ACT inhaler, Inhale 1 puff into the lungs every 6 (six) hours as needed for shortness of breath or wheezing., Disp: , Rfl: 0   Simethicone (GAS-X PO), Take 1 tablet by mouth daily as needed (gas)., Disp: , Rfl:    traMADol (ULTRAM) 50 MG tablet, Take 1 tablet by mouth every 6 (six) hours as needed., Disp: , Rfl:   Review of Systems:   Negative unless indicated in HPI.   Physical Exam: Vitals:   04/06/22 0805  BP: 124/74  Pulse: 99  Temp: 98.5 F (36.9 C)  TempSrc: Oral  SpO2: 100%  Weight: 257 lb 11.2 oz (116.9 kg)  Height: 5' 3"$  (1.6 m)    Body mass index is 45.65 kg/m.   Physical Exam Vitals reviewed.  Constitutional:      General: She is not in acute distress.    Appearance: Normal appearance. She is not ill-appearing, toxic-appearing or diaphoretic.  HENT:     Head: Normocephalic.     Right Ear: Tympanic  membrane, ear canal and external ear normal. There is no impacted cerumen.     Left Ear: Tympanic membrane, ear canal and external ear normal. There is no impacted cerumen.     Nose: Nose normal.     Mouth/Throat:     Mouth: Mucous membranes are moist.     Pharynx: Oropharynx is clear. No oropharyngeal exudate or posterior oropharyngeal erythema.  Eyes:     General: No scleral icterus.       Right eye: No discharge.        Left eye: No discharge.     Conjunctiva/sclera: Conjunctivae normal.     Pupils: Pupils are equal, round, and reactive to light.  Neck:     Vascular: No carotid bruit.  Cardiovascular:     Rate and Rhythm: Normal rate and regular rhythm.     Pulses: Normal pulses.     Heart sounds: Normal heart sounds.  Pulmonary:     Effort: Pulmonary effort is normal. No respiratory distress.     Breath sounds: Normal breath sounds.  Abdominal:     General: Abdomen is flat. Bowel sounds are normal.     Palpations: Abdomen is soft.  Musculoskeletal:        General: Normal range of motion.     Cervical back: Normal range of motion.  Skin:    General: Skin is warm and dry.     Capillary Refill: Capillary refill takes less than 2 seconds.  Neurological:     General: No focal deficit present.     Mental Status: She is alert and oriented to person, place, and time. Mental status is at baseline.  Psychiatric:        Mood and Affect: Mood normal.        Behavior: Behavior normal.         Thought Content: Thought content normal.        Judgment: Judgment normal.       Subsequent Medicare wellness visit   1. Risk factors, based on past  M,S,F - Cardiac Risk Factors include: diabetes mellitus;advanced age (>19mn, >>18women)   2.  Physical activities: Dietary issues and exercise activities discussed:  Current Exercise Habits: Structured exercise class, Type of exercise: Other - see comments (swim), Time (Minutes): 30, Frequency (Times/Week): 3, Weekly Exercise (Minutes/Week): 90, Intensity: Moderate, Exercise limited by: orthopedic condition(s)   3.  Depression/mood:  FMount [facility]Office [facility]        4.  ADL's:    04/06/2022    8:03 AM 04/05/2022    6:19 PM  In your present state of health, do you have any difficulty performing the following activities:  Hearing? 0 0  Vision? 0 0  Difficulty concentrating or making decisions? 0 0  Walking or climbing stairs? 1 1  Comment right knee and back pain   Dressing or bathing? 0 0  Doing errands, shopping? 0 0  Preparing Food and eating ? N N  Using the Toilet? N N  In the past six months, have you accidently leaked urine? Y Y  Do you have problems with loss of bowel control? N N  Managing your Medications? N N  Managing your Finances? N N  Housekeeping or managing your Housekeeping? N N     5.  Fall risk:     02/17/2021    4:02 PM 04/02/2021   10:13 AM 06/30/2021   10:53 AM  04/05/2022    6:19 PM 04/06/2022    8:12 AM  Fall Risk  Falls in the past year?  1 0 0 1  Was there an injury with Fall?  1 0 0 0  Fall Risk Category Calculator  2 0 0 1  Fall Risk Category (Retired)  Moderate Low    (RETIRED) Patient Fall Risk Level Low fall risk  Low fall risk    Patient at Risk for Falls Due to   No Fall Risks  Impaired balance/gait  Fall risk Follow up  Falls evaluation completed Falls evaluation completed  Falls evaluation  completed     6.  Home safety: No problems identified   7.  Height weight, and visual acuity: height and weight as above, vision/hearing: Vision Screening   Right eye Left eye Both eyes  Without correction     With correction 20/20 20/20 20/20 $     8.  Counseling: Counseling given: Not Answered    9. Lab orders based on risk factors: Laboratory update will be reviewed   10. Cognitive assessment:        04/06/2022    8:09 AM  6CIT Screen  What Year? 0 points  What month? 0 points  What time? 0 points  Count back from 20 0 points  Months in reverse 0 points  Repeat phrase 0 points  Total Score 0 points     11. Screening: Patient provided with a written and personalized 5-10 year screening schedule in the AVS. Health Maintenance  Topic Date Due   Medicare Annual Wellness Visit  11/27/2020   Yearly kidney function blood test for diabetes  12/31/2021   COVID-19 Vaccine (7 - 2023-24 season) 04/22/2022*   Zoster (Shingles) Vaccine (1 of 2) 07/05/2022*   Hemoglobin A1C  04/22/2022   Yearly kidney health urinalysis for diabetes  07/01/2022   Eye exam for diabetics  11/24/2022   Mammogram  01/06/2023   Complete foot exam   01/26/2023   DTaP/Tdap/Td vaccine (2 - Td or Tdap) 06/05/2023   Colon Cancer Screening  11/07/2026   Pneumonia Vaccine  Completed   Flu Shot  Completed   DEXA scan (bone density measurement)  Completed   Hepatitis C Screening: USPSTF Recommendation to screen - Ages 51-79 yo.  Completed   HPV Vaccine  Aged Out  *Topic was postponed. The date shown is not the original due date.    12. Provider List Update: Patient Care Team    Relationship Specialty Notifications Start End  Isaac Bliss, Rayford Halsted, MD PCP - General Internal Medicine  04/04/18   Erroll Luna, MD Consulting Physician General Surgery  11/26/14   Arloa Koh, MD (Inactive) Consulting Physician Radiation Oncology  04/30/15   Truitt Merle, MD Consulting Physician Hematology  04/30/15    Sylvan Cheese, NP Nurse Practitioner Hematology and Oncology  04/30/15    Comment: Survivorship     13. Advance Directives: Does Patient Have a Medical Advance Directive?: Yes Type of Advance Directive: Healthcare Power of Attorney, Living will, Out of facility DNR (pink MOST or yellow form) Copy of [place] in Chart?: No - copy requested  14. Opioids: Patient is not on any opioid prescriptions and has no risk factors for a substance use disorder.   15.   Goals      Exercise 150 min/wk Moderate Activity     Keep doing water aerobics !!!          I have personally reviewed and  noted the following in the patient's chart:   Medical and social history Use of alcohol, tobacco or illicit drugs  Current medications and supplements Functional ability and status Nutritional status Physical activity Advanced directives List of other physicians Hospitalizations, surgeries, and ER visits in previous 12 months Vitals Screenings to include cognitive, depression, and falls Referrals and appointments  In addition, I have reviewed and discussed with patient certain preventive protocols, quality metrics, and best practice recommendations. A written personalized care plan for preventive services as well as general preventive health recommendations were provided to patient.  Impression and Plan:  Encounter for preventive health examination  Diabetes mellitus with coincident hypertension ([place]) - Plan: CBC with Differential/Platelet, Comprehensive metabolic panel, Hemoglobin A1c  Essential hypertension  Dyslipidemia - Plan: Lipid panel  Osteopenia, unspecified location - Plan: TSH, Vitamin B12, VITAMIN D 25 Hydroxy (Vit-D Deficiency, Fractures)  Morbid obesity ([facility])  Statin intolerance  Ductal carcinoma in situ (DCIS) of right breast  -Recommend routine eye and dental care. -Healthy lifestyle discussed in detail. -Labs to be updated  today. -Prostate cancer screening: Not applicable Health Maintenance  Topic Date Due   Medicare Annual Wellness Visit  11/27/2020   Yearly kidney function blood test for diabetes  12/31/2021   COVID-19 Vaccine (7 - 2023-24 season) 04/22/2022*   Zoster (Shingles) Vaccine (1 of 2) 07/05/2022*   Hemoglobin A1C  04/22/2022   Yearly kidney health urinalysis for diabetes  07/01/2022   Eye exam for diabetics  11/24/2022   Mammogram  01/06/2023   Complete foot exam   01/26/2023   DTaP/Tdap/Td vaccine (2 - Td or Tdap) 06/05/2023   Colon Cancer Screening  11/07/2026   Pneumonia Vaccine  Completed   Flu Shot  Completed   DEXA scan (bone density measurement)  Completed   Hepatitis C Screening: USPSTF Recommendation to screen - Ages 64-79 yo.  Completed   HPV Vaccine  Aged Out  *Topic was postponed. The date shown is not the original due date.       Lelon Frohlich, MD [facility]

## 2022-04-06 NOTE — Patient Instructions (Signed)
-  Nice seeing you today!!  -Lab work today; will notify you once results are available.  -Remember your RSV and shingles vaccines at the pharmacy.  -Schedule follow up in 3 months.

## 2022-04-07 ENCOUNTER — Other Ambulatory Visit: Payer: Self-pay | Admitting: Internal Medicine

## 2022-04-07 DIAGNOSIS — E785 Hyperlipidemia, unspecified: Secondary | ICD-10-CM

## 2022-04-07 DIAGNOSIS — E119 Type 2 diabetes mellitus without complications: Secondary | ICD-10-CM

## 2022-04-14 DIAGNOSIS — J3089 Other allergic rhinitis: Secondary | ICD-10-CM | POA: Diagnosis not present

## 2022-04-14 DIAGNOSIS — J301 Allergic rhinitis due to pollen: Secondary | ICD-10-CM | POA: Diagnosis not present

## 2022-04-22 DIAGNOSIS — J3089 Other allergic rhinitis: Secondary | ICD-10-CM | POA: Diagnosis not present

## 2022-04-22 DIAGNOSIS — J301 Allergic rhinitis due to pollen: Secondary | ICD-10-CM | POA: Diagnosis not present

## 2022-04-29 DIAGNOSIS — J301 Allergic rhinitis due to pollen: Secondary | ICD-10-CM | POA: Diagnosis not present

## 2022-04-29 DIAGNOSIS — J3089 Other allergic rhinitis: Secondary | ICD-10-CM | POA: Diagnosis not present

## 2022-04-29 DIAGNOSIS — J3081 Allergic rhinitis due to animal (cat) (dog) hair and dander: Secondary | ICD-10-CM | POA: Diagnosis not present

## 2022-05-03 DIAGNOSIS — Z6841 Body Mass Index (BMI) 40.0 and over, adult: Secondary | ICD-10-CM | POA: Diagnosis not present

## 2022-05-03 DIAGNOSIS — M48061 Spinal stenosis, lumbar region without neurogenic claudication: Secondary | ICD-10-CM | POA: Diagnosis not present

## 2022-05-11 ENCOUNTER — Ambulatory Visit (INDEPENDENT_AMBULATORY_CARE_PROVIDER_SITE_OTHER): Payer: Medicare PPO | Admitting: Podiatry

## 2022-05-11 ENCOUNTER — Encounter: Payer: Self-pay | Admitting: Podiatry

## 2022-05-11 VITALS — BP 148/92

## 2022-05-11 DIAGNOSIS — M79674 Pain in right toe(s): Secondary | ICD-10-CM

## 2022-05-11 DIAGNOSIS — M79675 Pain in left toe(s): Secondary | ICD-10-CM | POA: Diagnosis not present

## 2022-05-11 DIAGNOSIS — B351 Tinea unguium: Secondary | ICD-10-CM

## 2022-05-11 DIAGNOSIS — E119 Type 2 diabetes mellitus without complications: Secondary | ICD-10-CM | POA: Diagnosis not present

## 2022-05-11 DIAGNOSIS — I10 Essential (primary) hypertension: Secondary | ICD-10-CM

## 2022-05-11 NOTE — Progress Notes (Unsigned)
  Subjective:  Patient ID: Darlene Mclaughlin, female    DOB: August 16, 1950,  MRN: [id number]  Darlene Mclaughlin presents to clinic today for {jgcomplaint:23593}  Chief Complaint  Patient presents with   Nail Problem    DFC BS-140 A1C-do not know PCP-Hernandez, Deniece Ree PCP VST-03/2022   New problem(s): None. {jgcomplaint:23593}  PCP is Isaac Bliss, Rayford Halsted, MD.  Allergies  Allergen Reactions   Atorvastatin     Hallucinations    Hydrochlorothiazide     Muscle pain   Hydrocodone     Swollen tongue   Macrobid [Nitrofurantoin Monohyd Macro]      Liver problems   Naproxen Swelling    Mouth and tongue swelling   Simvastatin     Hallucinations, muscle pains   Zanaflex [Tizanidine Hcl] Other (See Comments)     Liver problems    Review of Systems: Negative except as noted in the HPI.  Objective: No changes noted in today's physical examination. Vitals:   05/11/22 1341  BP: (!) 148/92   CECY Mclaughlin is a pleasant 72 y.o. female {jgbodyhabitus:24098} AAO x 3.  Vascular Examination: Capillary refill time immediate b/l. Vascular status intact b/l with palpable pedal pulses. Pedal hair present b/l. No edema. No pain with calf compression b/l. Skin temperature gradient WNL b/l. No cyanosis or clubbing noted b/l LE.  Neurological Examination: Sensation grossly intact b/l with 10 gram monofilament. Vibratory sensation intact b/l.   Dermatological Examination: Pedal skin with normal turgor, texture and tone b/l. Toenails 1-5 b/l thick, discolored, elongated with subungual debris and pain on dorsal palpation.   Incurvated nailplate lateral border left hallux.  Nail border hypertrophy absent. There is tenderness to palpation. Sign(s) of infection: no clinical signs of infection noted on examination today..  Musculoskeletal Examination: Normal muscle strength 5/5 to all lower extremity muscle groups bilaterally. HAV with bunion bilaterally and hammertoes 2-5 b/l. Pes planus  deformity noted bilateral LE.Marland Kitchen No pain, crepitus or joint limitation noted with ROM b/l LE.  Patient ambulates independently without assistive aids.  Assessment/Plan: 1. Pain due to onychomycosis of toenails of both feet   2. Diabetes mellitus with coincident hypertension ([place])     No orders of the defined types were placed in this encounter.   None {Jgplan:23602::"-Patient/POA to call should there be question/concern in the interim."}   Return in about 3 months (around 08/11/2022).  Marzetta Board, DPM

## 2022-05-13 DIAGNOSIS — J301 Allergic rhinitis due to pollen: Secondary | ICD-10-CM | POA: Diagnosis not present

## 2022-05-13 DIAGNOSIS — J3089 Other allergic rhinitis: Secondary | ICD-10-CM | POA: Diagnosis not present

## 2022-05-27 DIAGNOSIS — J3089 Other allergic rhinitis: Secondary | ICD-10-CM | POA: Diagnosis not present

## 2022-05-27 DIAGNOSIS — J301 Allergic rhinitis due to pollen: Secondary | ICD-10-CM | POA: Diagnosis not present

## 2022-06-04 DIAGNOSIS — J3081 Allergic rhinitis due to animal (cat) (dog) hair and dander: Secondary | ICD-10-CM | POA: Diagnosis not present

## 2022-06-04 DIAGNOSIS — J301 Allergic rhinitis due to pollen: Secondary | ICD-10-CM | POA: Diagnosis not present

## 2022-06-04 DIAGNOSIS — J3089 Other allergic rhinitis: Secondary | ICD-10-CM | POA: Diagnosis not present

## 2022-06-07 ENCOUNTER — Other Ambulatory Visit: Payer: Self-pay | Admitting: Internal Medicine

## 2022-06-08 ENCOUNTER — Encounter: Payer: Self-pay | Admitting: Obstetrics and Gynecology

## 2022-06-08 ENCOUNTER — Ambulatory Visit: Payer: Medicare PPO | Admitting: Obstetrics and Gynecology

## 2022-06-08 VITALS — BP 128/62 | HR 66 | Ht 64.5 in | Wt 246.0 lb

## 2022-06-08 DIAGNOSIS — Z01419 Encounter for gynecological examination (general) (routine) without abnormal findings: Secondary | ICD-10-CM

## 2022-06-08 DIAGNOSIS — Z853 Personal history of malignant neoplasm of breast: Secondary | ICD-10-CM

## 2022-06-08 NOTE — Progress Notes (Signed)
72 y.o. G2P2 Widowed Black or [place] American Not Hispanic or Latino female here for annual exam. H/O hysterectomy. She has not had an exam for about 10 years.  No vaginal bleeding. Sexually active, no pain. No STD concerns. Same partner x 10 years.    H/O DCIS of the right breast, s/p lumpectomy in 11/2014.   H/O HTN, type 2 DM, elevated lipids, obesity.   No LMP recorded. Patient has had a hysterectomy.          Sexually active: Yes.    The current method of family planning is status post hysterectomy.    Exercising: No.  The patient does not participate in regular exercise at present. Smoker:  no  Health Maintenance: Pap:  She states that she had a hysterectomy in 1991 History of abnormal Pap:  no MMG:  01/05/22 Bi-rads 2 benign  BMD:   07/22/21 normal  Colonoscopy: 11/06/21 f/u 5 years  TDaP:  06/04/13 Gardasil: n/a   reports that she has never smoked. She has never used smokeless tobacco. She reports that she does not currently use alcohol. She reports that she does not use drugs. 2 grown sons, 3 grandchildren (2 girls and one boy).   Past Medical History:  Diagnosis Date   ALLERGIC RHINITIS 05/15/2008   Allergy    Anemia    ASTHMA 05/15/2008   Asthma    Blood transfusion without reported diagnosis 1991   anemia   Breast cancer 2016   right breast   Cancer    DCIS right breast   Diabetes mellitus without complication    GERD (gastroesophageal reflux disease)    Headache(784.0) 05/15/2008   Hyperlipidemia    HYPERTENSION 05/15/2008   IMPAIRED GLUCOSE TOLERANCE 05/15/2008   Obesity    OSTEOARTHRITIS 05/15/2008   back    Past Surgical History:  Procedure Laterality Date   ABDOMINAL HYSTERECTOMY     ANTERIOR LAT LUMBAR FUSION Left 05/21/2020   Procedure: LUMBAR TWO-THREE, LUMBAR THREE-FOUR DIRECT LATERAL LUMBAR INTERBODY FUSION;  Surgeon: Bedelia Person, MD;  Location: [facility] OR;  Service: Neurosurgery;  Laterality: Left;   APPLICATION OF ROBOTIC ASSISTANCE FOR  SPINAL PROCEDURE N/A 05/21/2020   Procedure: APPLICATION OF ROBOTIC ASSISTANCE FOR SPINAL PROCEDURE;  Surgeon: Bedelia Person, MD;  Location[facility];  Service: Neurosurgery;  Laterality: N/A;   BREAST LUMPECTOMY Right 12/17/2014   BREAST LUMPECTOMY WITH RADIOACTIVE SEED LOCALIZATION Right 12/17/2014   Procedure: RIGHT BREAST RADIOACTIVE SEED GUIDED PARTIAL MASTECTOMY;  Surgeon: Harriette Bouillon, MD;  Location: [facility];  Service: General;  Laterality: Right;   CESAREAN SECTION     x2   COLONOSCOPY     KNEE SURGERY     arthroscopic left   TRANSFORAMINAL LUMBAR INTERBODY FUSION W/ MIS 2 LEVEL Right 05/21/2020   Procedure: LUMBAR FOUR-FIVE, LUMBAR FIVE-SACRAL ONE MINIMALLY INVASIVE TRANSFORAMINAL LUMBAR INTERBODY FUSION WITH INSTRUMENTATION LUMBAR TWO-SACRAL ONE;  Surgeon: Bedelia Person, MD;  Location: [facility] OR;  Service: Neurosurgery;  Laterality: Right;    Current Outpatient Medications  Medication Sig Dispense Refill   Accu-Chek FastClix Lancets MISC 1 EACH BY DOES NOT APPLY ROUTE DAILY. USE FOR ACCU-CHEK GUIDE 102 each 3   ACCU-CHEK GUIDE test strip USE AS DIRECTED 100 strip 3   acetaminophen (TYLENOL) 650 MG CR tablet Take 650 mg by mouth every 8 (eight) hours as needed for pain.     aspirin 81 MG tablet Take 81 mg by mouth daily.     azelastine (OPTIVAR) 0.05 % ophthalmic  solution Place 1 drop into both eyes daily as needed (allergies).     Blood Glucose Monitoring Suppl (ACCU-CHEK GUIDE ME) w/Device KIT USE TO CHECK BLOOD SUGAR ONCE DAILY 1 kit 1   CALCIUM PO Take 1,000 mg by mouth daily.     cetirizine (ZYRTEC) 10 MG tablet Take 10 mg by mouth daily as needed for allergies.     chlorthalidone (HYGROTON) 25 MG tablet TAKE 1 TABLET BY MOUTH EVERY DAY 90 tablet 1   cyclobenzaprine (FLEXERIL) 10 MG tablet Take 1 tablet (10 mg total) by mouth 3 (three) times daily as needed for muscle spasms. 60 tablet 0   Docusate Sodium (DSS) 100 MG CAPS Take 1 capsule by mouth 2 (two)  times daily.     EPIPEN 2-PAK 0.3 MG/0.3ML SOAJ injection Inject 0.3 mg into the muscle as needed for anaphylaxis.  1   fluticasone (FLONASE) 50 MCG/ACT nasal spray Place 1 spray into both nostrils daily. (Patient taking differently: Place 1 spray into both nostrils daily as needed for allergies.) 16 g 5   KLOR-CON M20 20 MEQ tablet TAKE 1 TABLET BY MOUTH TWICE A DAY 180 tablet 1   Melatonin 10 MG TABS Take 10 mg by mouth at bedtime as needed (sleep).     metFORMIN (GLUCOPHAGE-XR) 500 MG 24 hr tablet TAKE 2 TABLETS (1,000 MG TOTAL) BY MOUTH DAILY WITH SUPPER 180 tablet 1   Multiple Vitamin (MULTIVITAMIN) tablet Take 1 tablet by mouth daily.     neomycin-polymyxin-hydrocortisone (CORTISPORIN) 3.5-10000-1 OTIC suspension Apply 1-2 drops daily after soaking and cover with bandaid 10 mL 0   NON FORMULARY EVERY OTHER WEEK ALLERGY INJECTIONS     PROAIR HFA 108 (90 BASE) MCG/ACT inhaler Inhale 1 puff into the lungs every 6 (six) hours as needed for shortness of breath or wheezing.  0   Simethicone (GAS-X PO) Take 1 tablet by mouth daily as needed (gas).     traMADol (ULTRAM) 50 MG tablet Take 1 tablet by mouth every 6 (six) hours as needed.     No current facility-administered medications for this visit.    Family History  Problem Relation Age of Onset   Diabetes Mother    Hypertension Mother    Breast cancer Mother 34       Breast cancer twice    Hyperlipidemia Father    Colon cancer Brother        4 th brother   Diabetes Brother    Pancreatic cancer Brother        middle brother   Stomach cancer Neg Hx    Esophageal cancer Neg Hx    Rectal cancer Neg Hx     Review of Systems  All other systems reviewed and are negative.   Exam:   BP 128/62   Pulse 66   Ht 5' 4.5" (1.638 m)   Wt 246 lb (111.6 kg)   SpO2 100%   BMI 41.57 kg/m   Weight change: @ Height:   Height: 5' 4.5" (163.8 cm)  Ht Readings from Last 3 Encounters:  06/08/22 5' 4.5" (1.638 m)  04/06/22   (1.6 m)  11/06/21  (1.626 m)    General appearance: alert, cooperative and appears stated age Head: Normocephalic, without obvious abnormality, atraumatic Neck: no adenopathy, supple, symmetrical, trachea midline and thyroid normal to inspection and palpation Breasts: normal appearance, no masses or tenderness Abdomen: soft, non-tender; non distended,  no masses,  no organomegaly Extremities: extremities normal, atraumatic, no cyanosis or edema  Skin: Skin color, texture, turgor normal. No rashes or lesions Lymph nodes: Cervical, supraclavicular, and axillary nodes normal. No abnormal inguinal nodes palpated Neurologic: Grossly normal   Pelvic: External genitalia:  no lesions              Urethra:  normal appearing urethra with no masses, tenderness or lesions              Bartholins and Skenes: normal                 Vagina: normal appearing vagina with normal color and discharge, no lesions              Cervix: absent               Bimanual Exam:  Uterus:  uterus absent              Adnexa: no mass, fullness, tenderness               Rectovaginal: Confirms               Anus:  normal sphincter tone, no lesions  Carolynn Serve, CMA chaperoned for the exam.      1. Encounter for breast and pelvic examination Normal exam Colonoscopy and DEXA are UTD Labs with primary  2. History of breast cancer She does breast self exams Mammogram UTD

## 2022-06-08 NOTE — Patient Instructions (Signed)

## 2022-06-17 ENCOUNTER — Ambulatory Visit: Payer: Medicare PPO | Attending: Internal Medicine | Admitting: Internal Medicine

## 2022-06-17 ENCOUNTER — Encounter: Payer: Self-pay | Admitting: Internal Medicine

## 2022-06-17 VITALS — BP 147/85 | HR 106 | Ht 65.0 in | Wt 253.4 lb

## 2022-06-17 DIAGNOSIS — T466X5A Adverse effect of antihyperlipidemic and antiarteriosclerotic drugs, initial encounter: Secondary | ICD-10-CM

## 2022-06-17 DIAGNOSIS — E785 Hyperlipidemia, unspecified: Secondary | ICD-10-CM | POA: Diagnosis not present

## 2022-06-17 DIAGNOSIS — E119 Type 2 diabetes mellitus without complications: Secondary | ICD-10-CM

## 2022-06-17 DIAGNOSIS — M791 Myalgia, unspecified site: Secondary | ICD-10-CM | POA: Diagnosis not present

## 2022-06-17 NOTE — Patient Instructions (Signed)
Medication Instructions:  Your physician recommends that you continue on your current medications as directed. Please refer to the Current Medication list given to you today.  Follow-Up: At [facility], you and your health needs are our priority.  As part of our continuing mission to provide you with exceptional heart care, we have created designated Provider Care Teams.  These Care Teams include your primary Cardiologist (physician) and Advanced Practice Providers (APPs -  Physician Assistants and Nurse Practitioners) who all work together to provide you with the care you need, when you need it.  We recommend signing up for the patient portal called "MyChart".  Sign up information is provided on this After Visit Summary.  MyChart is used to connect with patients for Virtual Visits (Telemedicine).  Patients are able to view lab/test results, encounter notes, upcoming appointments, etc.  Non-urgent messages can be sent to your provider as well.   To learn more about what you can do with MyChart, go to [email or web address].    Your next appointment:    AS NEEDED with Dr. [facility]

## 2022-06-17 NOTE — Progress Notes (Signed)
LIPID CLINIC CONSULT NOTE  Chief Complaint:  Dyslipidemia, statin intolerance  Primary Care Physician: Philip Aspen, Limmie Patricia, MD  Primary Cardiologist:  None  HPI:  Darlene Mclaughlin is a 72 y.o. female who is being seen today for the evaluation of dyslipidemia at the request of Philip Aspen, Almira Bar*.  This is a pleasant 71 year old female kindly referred for evaluation and management of dyslipidemia.  More recently she has been struggling with orthopedic issues including low back pain and difficulty ambulating.  Is noted that she has previous intolerances to atorvastatin and simvastatin causing hallucinations and muscle pains.  She is diabetic on low-dose Metformin but apparently well controlled.  Other cardiovascular risk factors include near morbid obesity and hypertension, again well controlled.  She had lipids in July which showed total cholesterol 196, HDL 50, LDL 122 and triglycerides 409.  A1c is 6.5.  She reports a reasonably healthy diet but has been much less active because of her back issues.  06/17/2022  Darlene Mclaughlin returns today for follow-up.  Her cholesterol is actually higher than it had been in the past.  Total 2 months ago was 217, triglycerides 113, HDL 51 and LDL 142.  Her target LDL is less than 70.  She she had muscle aches on the atorvastatin and was sent for further evaluation.  She is also previously tried simvastatin.  I suggested maybe low-dose rosuvastatin or ezetimibe is alternatives however she is very hesitant to be on additional medications because she "has a lot going on".  PMHx:  Past Medical History:  Diagnosis Date   ALLERGIC RHINITIS 05/15/2008   Allergy    Anemia    ASTHMA 05/15/2008   Asthma    Blood transfusion without reported diagnosis 1991   anemia   Breast cancer 2016   right breast   Cancer    DCIS right breast   Diabetes mellitus without complication    GERD (gastroesophageal reflux disease)    Headache(784.0) 05/15/2008    Hyperlipidemia    HYPERTENSION 05/15/2008   IMPAIRED GLUCOSE TOLERANCE 05/15/2008   Obesity    OSTEOARTHRITIS 05/15/2008   back    Past Surgical History:  Procedure Laterality Date   ABDOMINAL HYSTERECTOMY     ANTERIOR LAT LUMBAR FUSION Left 05/21/2020   Procedure: LUMBAR TWO-THREE, LUMBAR THREE-FOUR DIRECT LATERAL LUMBAR INTERBODY FUSION;  Surgeon: Bedelia Person, MD;  Location: [facility] OR;  Service: Neurosurgery;  Laterality: Left;   APPLICATION OF ROBOTIC ASSISTANCE FOR SPINAL PROCEDURE N/A 05/21/2020   Procedure: APPLICATION OF ROBOTIC ASSISTANCE FOR SPINAL PROCEDURE;  Surgeon: Bedelia Person, MD;  Location: [facility] OR;  Service: Neurosurgery;  Laterality: N/A;   BREAST LUMPECTOMY Right 12/17/2014   BREAST LUMPECTOMY WITH RADIOACTIVE SEED LOCALIZATION Right 12/17/2014   Procedure: RIGHT BREAST RADIOACTIVE SEED GUIDED PARTIAL MASTECTOMY;  Surgeon: Harriette Bouillon, MD;  Location: [facility];  Service: General;  Laterality: Right;   CESAREAN SECTION     x2   COLONOSCOPY     KNEE SURGERY     arthroscopic left   TRANSFORAMINAL LUMBAR INTERBODY FUSION W/ MIS 2 LEVEL Right 05/21/2020   Procedure: LUMBAR FOUR-FIVE, LUMBAR FIVE-SACRAL ONE MINIMALLY INVASIVE TRANSFORAMINAL LUMBAR INTERBODY FUSION WITH INSTRUMENTATION LUMBAR TWO-SACRAL ONE;  Surgeon: Bedelia Person, MD;  Location: [facility] OR;  Service: Neurosurgery;  Laterality: Right;    FAMHx:  Family History  Problem Relation Age of Onset   Diabetes Mother    Hypertension Mother    Breast cancer Mother 42  Breast cancer twice    Hyperlipidemia Father    Colon cancer Brother        4 th brother   Diabetes Brother    Pancreatic cancer Brother        middle brother   Stomach cancer Neg Hx    Esophageal cancer Neg Hx    Rectal cancer Neg Hx     SOCHx:   reports that she has never smoked. She has never used smokeless tobacco. She reports that she does not currently use alcohol. She reports that she does not use  drugs.  ALLERGIES:  Allergies  Allergen Reactions   Atorvastatin     Hallucinations    Hydrochlorothiazide     Muscle pain   Hydrocodone     Swollen tongue   Macrobid [Nitrofurantoin Monohyd Macro]      Liver problems   Naproxen Swelling    Mouth and tongue swelling   Simvastatin     Hallucinations, muscle pains   Zanaflex [Tizanidine Hcl] Other (See Comments)     Liver problems    ROS: Pertinent items noted in HPI and remainder of comprehensive ROS otherwise negative.  HOME MEDS: Current Outpatient Medications on File Prior to Visit  Medication Sig Dispense Refill   Accu-Chek FastClix Lancets MISC 1 EACH BY DOES NOT APPLY ROUTE DAILY. USE FOR ACCU-CHEK GUIDE 102 each 3   ACCU-CHEK GUIDE test strip USE AS DIRECTED 100 strip 3   acetaminophen (TYLENOL) 650 MG CR tablet Take 650 mg by mouth every 8 (eight) hours as needed for pain.     aspirin 81 MG tablet Take 81 mg by mouth daily.     azelastine (OPTIVAR) 0.05 % ophthalmic solution Place 1 drop into both eyes daily as needed (allergies).     Blood Glucose Monitoring Suppl (ACCU-CHEK GUIDE ME) w/Device KIT USE TO CHECK BLOOD SUGAR ONCE DAILY 1 kit 1   CALCIUM PO Take 1,000 mg by mouth daily.     cetirizine (ZYRTEC) 10 MG tablet Take 10 mg by mouth daily as needed for allergies.     chlorthalidone (HYGROTON) 25 MG tablet TAKE 1 TABLET BY MOUTH EVERY DAY 90 tablet 1   cyclobenzaprine (FLEXERIL) 10 MG tablet Take 1 tablet (10 mg total) by mouth 3 (three) times daily as needed for muscle spasms. 60 tablet 0   Docusate Sodium (DSS) 100 MG CAPS Take 1 capsule by mouth 2 (two) times daily.     EPIPEN 2-PAK 0.3 MG/0.3ML SOAJ injection Inject 0.3 mg into the muscle as needed for anaphylaxis.  1   fluticasone (FLONASE) 50 MCG/ACT nasal spray Place 1 spray into both nostrils daily. (Patient taking differently: Place 1 spray into both nostrils daily as needed for allergies.) 16 g 5   KLOR-CON M20 20 MEQ tablet TAKE 1 TABLET BY MOUTH TWICE  A DAY 180 tablet 1   Melatonin 10 MG TABS Take 10 mg by mouth at bedtime as needed (sleep).     metFORMIN (GLUCOPHAGE-XR) 500 MG 24 hr tablet TAKE 2 TABLETS (1,000 MG TOTAL) BY MOUTH DAILY WITH SUPPER 180 tablet 1   Multiple Vitamin (MULTIVITAMIN) tablet Take 1 tablet by mouth daily.     neomycin-polymyxin-hydrocortisone (CORTISPORIN) 3.5-10000-1 OTIC suspension Apply 1-2 drops daily after soaking and cover with bandaid 10 mL 0   NON FORMULARY EVERY OTHER WEEK ALLERGY INJECTIONS     PROAIR HFA 108 (90 BASE) MCG/ACT inhaler Inhale 1 puff into the lungs every 6 (six) hours as needed for shortness of  breath or wheezing.  0   Simethicone (GAS-X PO) Take 1 tablet by mouth daily as needed (gas).     traMADol (ULTRAM) 50 MG tablet Take 1 tablet by mouth every 6 (six) hours as needed.     No current facility-administered medications on file prior to visit.    LABS/IMAGING: No results found for this or any previous visit (from the past 48 hour(s)). No results found.  LIPID PANEL:    Component Value Date/Time   CHOL 217 (H) 04/06/2022 0844   TRIG 113.0 04/06/2022 0844   HDL 51.80 04/06/2022 0844   CHOLHDL 4 04/06/2022 0844   VLDL 22.6 04/06/2022 0844   LDLCALC 142 (H) 04/06/2022 0844   LDLCALC 122 (H) 09/18/2019 0908    WEIGHTS: Wt Readings from Last 3 Encounters:  06/17/22 253 lb 6.4 oz (114.9 kg)  06/08/22 246 lb (111.6 kg)  04/06/22 257 lb 11.2 oz (116.9 kg)    VITALS: BP (!) 147/85   Pulse (!) 106   Ht  (1.651 m)   Wt 253 lb 6.4 oz (114.9 kg)   SpO2 99%   BMI 42.17 kg/m   EXAM: Deferred  EKG: Deferred  ASSESSMENT: Mixed dyslipidemia, goal LDL <70 Type 2 diabetes Hypertension Morbid obesity Statin myalgias  PLAN: 1.   Darlene Mclaughlin continues to have uncontrolled lipids.  Her weight has gone back up somewhat.  She wants to continue to try to work on that.  I offered her some options for her with regards to lipid management as alternatives but she was not  interested in additional therapies at this time.  I have encouraged her to follow-up with her primary care provider and I am happy to see her back as needed.  Chrystie Nose, MD, [facility], FACP  [place]  [facility]  Medical Director of the Advanced Lipid Disorders &  Cardiovascular Risk Reduction Clinic Diplomate of the American Board of Clinical Lipidology Attending Cardiologist  Direct Dial: [phone]  Fax: [phone]  Website:  [email or web address]  Darlene Mclaughlin 06/17/2022, 10:17 AM

## 2022-06-18 DIAGNOSIS — J301 Allergic rhinitis due to pollen: Secondary | ICD-10-CM | POA: Diagnosis not present

## 2022-06-18 DIAGNOSIS — J3089 Other allergic rhinitis: Secondary | ICD-10-CM | POA: Diagnosis not present

## 2022-06-18 DIAGNOSIS — J3081 Allergic rhinitis due to animal (cat) (dog) hair and dander: Secondary | ICD-10-CM | POA: Diagnosis not present

## 2022-06-29 DIAGNOSIS — J3081 Allergic rhinitis due to animal (cat) (dog) hair and dander: Secondary | ICD-10-CM | POA: Diagnosis not present

## 2022-06-29 DIAGNOSIS — J3089 Other allergic rhinitis: Secondary | ICD-10-CM | POA: Diagnosis not present

## 2022-06-29 DIAGNOSIS — J301 Allergic rhinitis due to pollen: Secondary | ICD-10-CM | POA: Diagnosis not present

## 2022-07-05 ENCOUNTER — Ambulatory Visit: Payer: Medicare PPO | Admitting: Internal Medicine

## 2022-07-05 ENCOUNTER — Encounter: Payer: Self-pay | Admitting: Internal Medicine

## 2022-07-05 VITALS — BP 131/83 | HR 105 | Temp 98.3°F | Wt 254.8 lb

## 2022-07-05 DIAGNOSIS — Z7985 Long-term (current) use of injectable non-insulin antidiabetic drugs: Secondary | ICD-10-CM | POA: Diagnosis not present

## 2022-07-05 DIAGNOSIS — Z6841 Body Mass Index (BMI) 40.0 and over, adult: Secondary | ICD-10-CM

## 2022-07-05 DIAGNOSIS — I10 Essential (primary) hypertension: Secondary | ICD-10-CM | POA: Diagnosis not present

## 2022-07-05 DIAGNOSIS — M48061 Spinal stenosis, lumbar region without neurogenic claudication: Secondary | ICD-10-CM | POA: Diagnosis not present

## 2022-07-05 DIAGNOSIS — Z789 Other specified health status: Secondary | ICD-10-CM

## 2022-07-05 DIAGNOSIS — E785 Hyperlipidemia, unspecified: Secondary | ICD-10-CM | POA: Diagnosis not present

## 2022-07-05 DIAGNOSIS — E119 Type 2 diabetes mellitus without complications: Secondary | ICD-10-CM | POA: Diagnosis not present

## 2022-07-05 LAB — POCT GLYCOSYLATED HEMOGLOBIN (HGB A1C): Hemoglobin A1C: 8.2 % — AB (ref 4.0–5.6)

## 2022-07-05 MED ORDER — OZEMPIC (0.25 OR 0.5 MG/DOSE) 2 MG/3ML ~~LOC~~ SOPN: 0.5000 mg | PEN_INJECTOR | SUBCUTANEOUS | 2 refills | Status: DC

## 2022-07-05 MED ORDER — ROSUVASTATIN CALCIUM 5 MG PO TABS: 5.0000 mg | ORAL_TABLET | ORAL | 1 refills | Status: DC

## 2022-07-05 NOTE — Assessment & Plan Note (Signed)
Uncontrolled. A1c increased to 8.2. Continue metformin, add ozempic 0.5 mg weekly. Discussed lifestyle changes at length.

## 2022-07-05 NOTE — Assessment & Plan Note (Signed)
Suspect right leg pain related to this. As she has had surgery, recommend she follow up with back surgeon. May need repeat MRI to follow.

## 2022-07-05 NOTE — Assessment & Plan Note (Signed)
Discussed healthy lifestyle, including increased physical activity and better food choices to promote weight loss.  

## 2022-07-05 NOTE — Assessment & Plan Note (Signed)
Uncontrolled. Will try 5 mg of Crestor 3 days to see if she can tolerate. She has not tolerated atorvastatin or simvastatin previously.

## 2022-07-05 NOTE — Assessment & Plan Note (Signed)
Above goal. Hesitant to make further changes. Hopefully will improve with lifestyle changes we have discussed.

## 2022-07-05 NOTE — Patient Instructions (Signed)
-  Nice seeing you today!!  -Start ozempic 0.5 mg injected weekly.  -Try rosuvastatin 5 mg on Monday, Wednesday and Friday.  -Schedule follow up in 3 months.

## 2022-07-05 NOTE — Progress Notes (Signed)
Established Patient Office Visit     CC/Reason for Visit: Follow-up chronic conditions  HPI: Darlene Mclaughlin is a 72 y.o. female who is coming in today for the above mentioned reasons. Past Medical History is significant for: Morbid obesity, hyperlipidemia, hypertension, type 2 diabetes, lumbar spinal stenosis.  For the last 6 months she has had increasing A1c is now up to 8.2.  She has been unable to tolerate atorvastatin and simvastatin in the past and her LDL remains suboptimal.  She has gained some weight.  She is starting to have again some right-sided pain which she had prior to her spinal stenosis surgery.   Past Medical/Surgical History: Past Medical History:  Diagnosis Date   ALLERGIC RHINITIS 05/15/2008   Allergy    Anemia    ASTHMA 05/15/2008   Asthma    Blood transfusion without reported diagnosis 1991   anemia   Breast cancer (HCC) 2016   right breast   Cancer (HCC)    DCIS right breast   Diabetes mellitus without complication (HCC)    GERD (gastroesophageal reflux disease)    Headache(784.0) 05/15/2008   Hyperlipidemia    HYPERTENSION 05/15/2008   IMPAIRED GLUCOSE TOLERANCE 05/15/2008   Obesity    OSTEOARTHRITIS 05/15/2008   back    Past Surgical History:  Procedure Laterality Date   ABDOMINAL HYSTERECTOMY     ANTERIOR LAT LUMBAR FUSION Left 05/21/2020   Procedure: LUMBAR TWO-THREE, LUMBAR THREE-FOUR DIRECT LATERAL LUMBAR INTERBODY FUSION;  Surgeon: Bedelia Person, MD;  Location: [facility] OR;  Service: Neurosurgery;  Laterality: Left;   APPLICATION OF ROBOTIC ASSISTANCE FOR SPINAL PROCEDURE N/A 05/21/2020   Procedure: APPLICATION OF ROBOTIC ASSISTANCE FOR SPINAL PROCEDURE;  Surgeon: Bedelia Person, MD;  Location: [facility] OR;  Service: Neurosurgery;  Laterality: N/A;   BREAST LUMPECTOMY Right 12/17/2014   BREAST LUMPECTOMY WITH RADIOACTIVE SEED LOCALIZATION Right 12/17/2014   Procedure: RIGHT BREAST RADIOACTIVE SEED GUIDED PARTIAL MASTECTOMY;  Surgeon:  Harriette Bouillon, MD;  Location: [facility];  Service: General;  Laterality: Right;   CESAREAN SECTION     x2   COLONOSCOPY     KNEE SURGERY     arthroscopic left   TRANSFORAMINAL LUMBAR INTERBODY FUSION W/ MIS 2 LEVEL Right 05/21/2020   Procedure: LUMBAR FOUR-FIVE, LUMBAR FIVE-SACRAL ONE MINIMALLY INVASIVE TRANSFORAMINAL LUMBAR INTERBODY FUSION WITH INSTRUMENTATION LUMBAR TWO-SACRAL ONE;  Surgeon: Bedelia Person, MD;  Location: [facility] OR;  Service: Neurosurgery;  Laterality: Right;    Social History:  reports that she has never smoked. She has never used smokeless tobacco. She reports that she does not currently use alcohol. She reports that she does not use drugs.  Allergies: Allergies  Allergen Reactions   Atorvastatin     Hallucinations    Hydrochlorothiazide     Muscle pain   Hydrocodone     Swollen tongue   Macrobid [Nitrofurantoin Monohyd Macro]      Liver problems   Naproxen Swelling    Mouth and tongue swelling   Simvastatin     Hallucinations, muscle pains   Zanaflex [Tizanidine Hcl] Other (See Comments)     Liver problems    Family History:  Family History  Problem Relation Age of Onset   Diabetes Mother    Hypertension Mother    Breast cancer Mother 79       Breast cancer twice    Hyperlipidemia Father    Colon cancer Brother        4 th brother  Diabetes Brother    Pancreatic cancer Brother        middle brother   Stomach cancer Neg Hx    Esophageal cancer Neg Hx    Rectal cancer Neg Hx      Current Outpatient Medications:    Accu-Chek FastClix Lancets MISC, 1 EACH BY DOES NOT APPLY ROUTE DAILY. USE FOR ACCU-CHEK GUIDE, Disp: 102 each, Rfl: 3   ACCU-CHEK GUIDE test strip, USE AS DIRECTED, Disp: 100 strip, Rfl: 3   acetaminophen (TYLENOL) 650 MG CR tablet, Take 650 mg by mouth every 8 (eight) hours as needed for pain., Disp: , Rfl:    aspirin 81 MG tablet, Take 81 mg by mouth daily., Disp: , Rfl:    azelastine (OPTIVAR) 0.05 %  ophthalmic solution, Place 1 drop into both eyes daily as needed (allergies)., Disp: , Rfl:    Blood Glucose Monitoring Suppl (ACCU-CHEK GUIDE ME) w/Device KIT, USE TO CHECK BLOOD SUGAR ONCE DAILY, Disp: 1 kit, Rfl: 1   CALCIUM PO, Take 1,000 mg by mouth daily., Disp: , Rfl:    cetirizine (ZYRTEC) 10 MG tablet, Take 10 mg by mouth daily as needed for allergies., Disp: , Rfl:    chlorthalidone (HYGROTON) 25 MG tablet, TAKE 1 TABLET BY MOUTH EVERY DAY, Disp: 90 tablet, Rfl: 1   cyclobenzaprine (FLEXERIL) 10 MG tablet, Take 1 tablet (10 mg total) by mouth 3 (three) times daily as needed for muscle spasms., Disp: 60 tablet, Rfl: 0   Docusate Sodium (DSS) 100 MG CAPS, Take 1 capsule by mouth 2 (two) times daily., Disp: , Rfl:    EPIPEN 2-PAK 0.3 MG/0.3ML SOAJ injection, Inject 0.3 mg into the muscle as needed for anaphylaxis., Disp: , Rfl: 1   fluticasone (FLONASE) 50 MCG/ACT nasal spray, Place 1 spray into both nostrils daily. (Patient taking differently: Place 1 spray into both nostrils daily as needed for allergies.), Disp: 16 g, Rfl: 5   KLOR-CON M20 20 MEQ tablet, TAKE 1 TABLET BY MOUTH TWICE A DAY, Disp: 180 tablet, Rfl: 1   Melatonin 10 MG TABS, Take 10 mg by mouth at bedtime as needed (sleep)., Disp: , Rfl:    metFORMIN (GLUCOPHAGE-XR) 500 MG 24 hr tablet, TAKE 2 TABLETS (1,000 MG TOTAL) BY MOUTH DAILY WITH SUPPER, Disp: 180 tablet, Rfl: 1   Multiple Vitamin (MULTIVITAMIN) tablet, Take 1 tablet by mouth daily., Disp: , Rfl:    neomycin-polymyxin-hydrocortisone (CORTISPORIN) 3.5-10000-1 OTIC suspension, Apply 1-2 drops daily after soaking and cover with bandaid, Disp: 10 mL, Rfl: 0   NON FORMULARY, EVERY OTHER WEEK ALLERGY INJECTIONS, Disp: , Rfl:    PROAIR HFA 108 (90 BASE) MCG/ACT inhaler, Inhale 1 puff into the lungs every 6 (six) hours as needed for shortness of breath or wheezing., Disp: , Rfl: 0   rosuvastatin (CRESTOR) 5 MG tablet, Take 1 tablet (5 mg total) by mouth every Monday, Wednesday,  and Friday., Disp: 90 tablet, Rfl: 1   Semaglutide,0.25 or 0.5MG /DOS, (OZEMPIC, 0.25 OR 0.5 MG/DOSE,) 2 MG/3ML SOPN, Inject 0.5 mg into the skin once a week., Disp: 3 mL, Rfl: 2   Simethicone (GAS-X PO), Take 1 tablet by mouth daily as needed (gas)., Disp: , Rfl:    traMADol (ULTRAM) 50 MG tablet, Take 1 tablet by mouth every 6 (six) hours as needed., Disp: , Rfl:   Review of Systems:  Negative unless indicated in HPI.   Physical Exam: Vitals:   07/05/22 1016  BP: 131/83  Pulse: (!) 105  Temp: 98.3 F (  36.8 C)  TempSrc: Oral  SpO2: 98%  Weight: 254 lb 12.8 oz (115.6 kg)    Body mass index is 42.4 kg/m.   Physical Exam Vitals reviewed.  Constitutional:      Appearance: Normal appearance.  HENT:     Head: Normocephalic and atraumatic.  Eyes:     Conjunctiva/sclera: Conjunctivae normal.     Pupils: Pupils are equal, round, and reactive to light.  Cardiovascular:     Rate and Rhythm: Normal rate and regular rhythm.  Pulmonary:     Effort: Pulmonary effort is normal.     Breath sounds: Normal breath sounds.  Musculoskeletal:        General: Swelling present.  Skin:    General: Skin is warm and dry.  Neurological:     General: No focal deficit present.     Mental Status: She is alert and oriented to person, place, and time.  Psychiatric:        Mood and Affect: Mood normal.        Behavior: Behavior normal.        Thought Content: Thought content normal.        Judgment: Judgment normal.      Impression and Plan:  Diabetes mellitus with coincident hypertension (HCC) Assessment & Plan: Uncontrolled. A1c increased to 8.2. Continue metformin, add ozempic 0.5 mg weekly. Discussed lifestyle changes at length.  Orders: -     POCT glycosylated hemoglobin (Hb A1C) -     Microalbumin / creatinine urine ratio; Future -     Ozempic (0.25 or 0.5 MG/DOSE); Inject 0.5 mg into the skin once a week.  Dispense: 3 mL; Refill: 2  Morbid obesity (HCC) Assessment &  Plan: -Discussed healthy lifestyle, including increased physical activity and better food choices to promote weight loss.    Essential hypertension Assessment & Plan: Above goal. Hesitant to make further changes. Hopefully will improve with lifestyle changes we have discussed.   Spinal stenosis of lumbar region, unspecified whether neurogenic claudication present Assessment & Plan: Suspect right leg pain related to this. As she has had surgery, recommend she follow up with back surgeon. May need repeat MRI to follow.   Dyslipidemia Assessment & Plan: Uncontrolled. Will try 5 mg of Crestor 3 days to see if she can tolerate. She has not tolerated atorvastatin or simvastatin previously.  Orders: -     Rosuvastatin Calcium; Take 1 tablet (5 mg total) by mouth every Monday, Wednesday, and Friday.  Dispense: 90 tablet; Refill: 1  Statin intolerance     Time spent:33 minutes reviewing chart, interviewing and examining patient and formulating plan of care.  We have discussed importance of following up with back surgeon and compliance with medication and lifestyle changes.     Chaya Jan, MD [facility] at [facility]

## 2022-07-07 ENCOUNTER — Other Ambulatory Visit: Payer: Medicare PPO

## 2022-07-07 DIAGNOSIS — M4326 Fusion of spine, lumbar region: Secondary | ICD-10-CM | POA: Diagnosis not present

## 2022-07-07 DIAGNOSIS — M79604 Pain in right leg: Secondary | ICD-10-CM | POA: Diagnosis not present

## 2022-07-08 ENCOUNTER — Other Ambulatory Visit: Payer: Self-pay | Admitting: Internal Medicine

## 2022-07-08 ENCOUNTER — Ambulatory Visit (HOSPITAL_COMMUNITY)
Admission: RE | Admit: 2022-07-08 | Discharge: 2022-07-08 | Disposition: A | Discharge status: Home / Self Care | Payer: Medicare PPO | Source: Ambulatory Visit | Attending: Internal Medicine | Admitting: Internal Medicine

## 2022-07-08 ENCOUNTER — Other Ambulatory Visit (INDEPENDENT_AMBULATORY_CARE_PROVIDER_SITE_OTHER): Payer: Medicare PPO

## 2022-07-08 ENCOUNTER — Other Ambulatory Visit (HOSPITAL_COMMUNITY): Payer: Self-pay | Admitting: Neurosurgery

## 2022-07-08 DIAGNOSIS — J3081 Allergic rhinitis due to animal (cat) (dog) hair and dander: Secondary | ICD-10-CM | POA: Diagnosis not present

## 2022-07-08 DIAGNOSIS — M79604 Pain in right leg: Secondary | ICD-10-CM | POA: Insufficient documentation

## 2022-07-08 DIAGNOSIS — J301 Allergic rhinitis due to pollen: Secondary | ICD-10-CM | POA: Diagnosis not present

## 2022-07-08 DIAGNOSIS — M8580 Other specified disorders of bone density and structure, unspecified site: Secondary | ICD-10-CM

## 2022-07-08 DIAGNOSIS — I10 Essential (primary) hypertension: Secondary | ICD-10-CM

## 2022-07-08 DIAGNOSIS — E119 Type 2 diabetes mellitus without complications: Secondary | ICD-10-CM

## 2022-07-08 DIAGNOSIS — J3089 Other allergic rhinitis: Secondary | ICD-10-CM | POA: Diagnosis not present

## 2022-07-08 LAB — MICROALBUMIN / CREATININE URINE RATIO
Creatinine,U: 41.1 mg/dL
Microalb Creat Ratio: 2 mg/g (ref 0.0–30.0)
Microalb, Ur: 0.8 mg/dL (ref 0.0–1.9)

## 2022-07-08 LAB — VITAMIN D 25 HYDROXY (VIT D DEFICIENCY, FRACTURES): VITD: 72.81 ng/mL (ref 30.00–100.00)

## 2022-07-13 DIAGNOSIS — J4520 Mild intermittent asthma, uncomplicated: Secondary | ICD-10-CM | POA: Diagnosis not present

## 2022-07-13 DIAGNOSIS — J3089 Other allergic rhinitis: Secondary | ICD-10-CM | POA: Diagnosis not present

## 2022-07-13 DIAGNOSIS — H1045 Other chronic allergic conjunctivitis: Secondary | ICD-10-CM | POA: Diagnosis not present

## 2022-07-13 DIAGNOSIS — J301 Allergic rhinitis due to pollen: Secondary | ICD-10-CM | POA: Diagnosis not present

## 2022-07-13 DIAGNOSIS — J3081 Allergic rhinitis due to animal (cat) (dog) hair and dander: Secondary | ICD-10-CM | POA: Diagnosis not present

## 2022-07-28 DIAGNOSIS — J3081 Allergic rhinitis due to animal (cat) (dog) hair and dander: Secondary | ICD-10-CM | POA: Diagnosis not present

## 2022-07-28 DIAGNOSIS — J301 Allergic rhinitis due to pollen: Secondary | ICD-10-CM | POA: Diagnosis not present

## 2022-07-28 DIAGNOSIS — J3089 Other allergic rhinitis: Secondary | ICD-10-CM | POA: Diagnosis not present

## 2022-08-03 DIAGNOSIS — J301 Allergic rhinitis due to pollen: Secondary | ICD-10-CM | POA: Diagnosis not present

## 2022-08-03 DIAGNOSIS — J3089 Other allergic rhinitis: Secondary | ICD-10-CM | POA: Diagnosis not present

## 2022-08-03 DIAGNOSIS — J3081 Allergic rhinitis due to animal (cat) (dog) hair and dander: Secondary | ICD-10-CM | POA: Diagnosis not present

## 2022-08-10 DIAGNOSIS — J3081 Allergic rhinitis due to animal (cat) (dog) hair and dander: Secondary | ICD-10-CM | POA: Diagnosis not present

## 2022-08-10 DIAGNOSIS — J3089 Other allergic rhinitis: Secondary | ICD-10-CM | POA: Diagnosis not present

## 2022-08-10 DIAGNOSIS — J301 Allergic rhinitis due to pollen: Secondary | ICD-10-CM | POA: Diagnosis not present

## 2022-08-17 DIAGNOSIS — J3081 Allergic rhinitis due to animal (cat) (dog) hair and dander: Secondary | ICD-10-CM | POA: Diagnosis not present

## 2022-08-17 DIAGNOSIS — J301 Allergic rhinitis due to pollen: Secondary | ICD-10-CM | POA: Diagnosis not present

## 2022-08-17 DIAGNOSIS — J3089 Other allergic rhinitis: Secondary | ICD-10-CM | POA: Diagnosis not present

## 2022-08-24 DIAGNOSIS — J301 Allergic rhinitis due to pollen: Secondary | ICD-10-CM | POA: Diagnosis not present

## 2022-08-24 DIAGNOSIS — J3081 Allergic rhinitis due to animal (cat) (dog) hair and dander: Secondary | ICD-10-CM | POA: Diagnosis not present

## 2022-08-24 DIAGNOSIS — J3089 Other allergic rhinitis: Secondary | ICD-10-CM | POA: Diagnosis not present

## 2022-08-31 DIAGNOSIS — J3089 Other allergic rhinitis: Secondary | ICD-10-CM | POA: Diagnosis not present

## 2022-08-31 DIAGNOSIS — J301 Allergic rhinitis due to pollen: Secondary | ICD-10-CM | POA: Diagnosis not present

## 2022-09-03 ENCOUNTER — Telehealth: Payer: Self-pay | Admitting: Internal Medicine

## 2022-09-03 NOTE — Telephone Encounter (Addendum)
Error- please disregard

## 2022-09-05 ENCOUNTER — Other Ambulatory Visit: Payer: Self-pay | Admitting: Internal Medicine

## 2022-09-07 DIAGNOSIS — J301 Allergic rhinitis due to pollen: Secondary | ICD-10-CM | POA: Diagnosis not present

## 2022-09-07 DIAGNOSIS — J3089 Other allergic rhinitis: Secondary | ICD-10-CM | POA: Diagnosis not present

## 2022-09-15 ENCOUNTER — Ambulatory Visit: Payer: Medicare PPO | Admitting: Podiatry

## 2022-09-15 ENCOUNTER — Encounter: Payer: Self-pay | Admitting: Podiatry

## 2022-09-15 DIAGNOSIS — I10 Essential (primary) hypertension: Secondary | ICD-10-CM

## 2022-09-15 DIAGNOSIS — B351 Tinea unguium: Secondary | ICD-10-CM

## 2022-09-15 DIAGNOSIS — J3089 Other allergic rhinitis: Secondary | ICD-10-CM | POA: Diagnosis not present

## 2022-09-15 DIAGNOSIS — M79674 Pain in right toe(s): Secondary | ICD-10-CM

## 2022-09-15 DIAGNOSIS — M79675 Pain in left toe(s): Secondary | ICD-10-CM

## 2022-09-15 DIAGNOSIS — E119 Type 2 diabetes mellitus without complications: Secondary | ICD-10-CM | POA: Diagnosis not present

## 2022-09-15 DIAGNOSIS — J301 Allergic rhinitis due to pollen: Secondary | ICD-10-CM | POA: Diagnosis not present

## 2022-09-15 DIAGNOSIS — J3081 Allergic rhinitis due to animal (cat) (dog) hair and dander: Secondary | ICD-10-CM | POA: Diagnosis not present

## 2022-09-15 NOTE — Progress Notes (Signed)
  Subjective:  Patient ID: Darlene Mclaughlin, female    DOB: [birth year],  MRN: [id number]  Darlene Mclaughlin presents to clinic today for: preventative diabetic foot care and painful elongated mycotic toenails 1-5 bilaterally which are tender when wearing enclosed shoe gear. Pain is relieved with periodic professional debridement.  Chief Complaint  Patient presents with   Diabetes    [facility] BS - 140 A1C - DK LVPCP - 06/2022    PCP is Philip Aspen, Limmie Patricia, MD.  Allergies  Allergen Reactions   Atorvastatin     Hallucinations    Hydrochlorothiazide     Muscle pain   Hydrocodone     Swollen tongue   Macrobid [Nitrofurantoin Monohyd Macro]      Liver problems   Naproxen Swelling    Mouth and tongue swelling   Simvastatin     Hallucinations, muscle pains   Zanaflex [Tizanidine Hcl] Other (See Comments)     Liver problems    Review of Systems: Negative except as noted in the HPI.  Objective: No changes noted in today's physical examination. There were no vitals filed for this visit.  Darlene Mclaughlin is a pleasant 72 y.o. female in NAD. AAO x 3.  Vascular Examination: Capillary refill time <3 seconds b/l LE. Palpable pedal pulses b/l LE. Digital hair present b/l. No pedal edema b/l. Skin temperature gradient WNL b/l. No varicosities b/l. No cyanosis or clubbing noted b/l LE.Marland Kitchen  Dermatological Examination: Pedal skin with normal turgor, texture and tone b/l. No open wounds. No interdigital macerations b/l. Toenails 1-5 b/l thickened, discolored, dystrophic with subungual debris. There is pain on palpation to dorsal aspect of nailplates. .  Neurological Examination: Protective sensation intact with 10 gram monofilament b/l LE.   Musculoskeletal Examination: Muscle strength 5/5 to all lower extremity muscle groups bilaterally. No pain, crepitus or joint limitation noted with ROM bilateral LE. HAV with bunion deformity noted b/l LE. Hammertoe deformity noted 2-5 b/l. Pes planus  deformity noted bilateral LE.     Latest Ref Rng & Units 07/05/2022   10:23 AM 04/06/2022    8:44 AM 10/20/2021    9:31 AM  Hemoglobin A1C  Hemoglobin-A1c 4.0 - 5.6 % 8.2  8.1  6.8    Assessment/Plan: 1. Pain due to onychomycosis of toenails of both feet   2. Diabetes mellitus with coincident hypertension (HCC)    -Consent given for treatment as described below: -Examined patient. -Continue foot and shoe inspections daily. Monitor blood glucose per PCP/Endocrinologist's recommendations. -Mycotic toenails 1-5 bilaterally were debrided in length and girth with sterile nail nippers and dremel without incident. -Patient/POA to call should there be question/concern in the interim.   Return in about 3 months (around 12/16/2022).  Freddie Breech, DPM

## 2022-09-21 DIAGNOSIS — J301 Allergic rhinitis due to pollen: Secondary | ICD-10-CM | POA: Diagnosis not present

## 2022-09-21 DIAGNOSIS — J3089 Other allergic rhinitis: Secondary | ICD-10-CM | POA: Diagnosis not present

## 2022-09-22 ENCOUNTER — Encounter (INDEPENDENT_AMBULATORY_CARE_PROVIDER_SITE_OTHER): Payer: Self-pay

## 2022-09-27 ENCOUNTER — Other Ambulatory Visit: Payer: Self-pay | Admitting: Podiatry

## 2022-09-27 ENCOUNTER — Other Ambulatory Visit: Payer: Self-pay | Admitting: Internal Medicine

## 2022-09-29 DIAGNOSIS — J3081 Allergic rhinitis due to animal (cat) (dog) hair and dander: Secondary | ICD-10-CM | POA: Diagnosis not present

## 2022-09-29 DIAGNOSIS — J301 Allergic rhinitis due to pollen: Secondary | ICD-10-CM | POA: Diagnosis not present

## 2022-09-29 DIAGNOSIS — J3089 Other allergic rhinitis: Secondary | ICD-10-CM | POA: Diagnosis not present

## 2022-10-11 ENCOUNTER — Ambulatory Visit: Payer: Medicare PPO | Admitting: Internal Medicine

## 2022-10-11 ENCOUNTER — Encounter: Payer: Self-pay | Admitting: Internal Medicine

## 2022-10-11 VITALS — BP 120/80 | HR 100 | Temp 98.3°F | Wt 244.8 lb

## 2022-10-11 DIAGNOSIS — M48061 Spinal stenosis, lumbar region without neurogenic claudication: Secondary | ICD-10-CM | POA: Diagnosis not present

## 2022-10-11 DIAGNOSIS — E785 Hyperlipidemia, unspecified: Secondary | ICD-10-CM | POA: Diagnosis not present

## 2022-10-11 DIAGNOSIS — I10 Essential (primary) hypertension: Secondary | ICD-10-CM | POA: Diagnosis not present

## 2022-10-11 DIAGNOSIS — J301 Allergic rhinitis due to pollen: Secondary | ICD-10-CM | POA: Diagnosis not present

## 2022-10-11 DIAGNOSIS — J3081 Allergic rhinitis due to animal (cat) (dog) hair and dander: Secondary | ICD-10-CM | POA: Diagnosis not present

## 2022-10-11 DIAGNOSIS — E119 Type 2 diabetes mellitus without complications: Secondary | ICD-10-CM

## 2022-10-11 DIAGNOSIS — J3089 Other allergic rhinitis: Secondary | ICD-10-CM | POA: Diagnosis not present

## 2022-10-11 LAB — POCT GLYCOSYLATED HEMOGLOBIN (HGB A1C): Hemoglobin A1C: 7.7 % — AB (ref 4.0–5.6)

## 2022-10-11 NOTE — Assessment & Plan Note (Signed)
Improved with physical activity.

## 2022-10-11 NOTE — Assessment & Plan Note (Signed)
-  Discussed healthy lifestyle, including increased physical activity and better food choices to promote weight loss. -Congratulated on weight loss success thus far.

## 2022-10-11 NOTE — Assessment & Plan Note (Signed)
Well controlled on current

## 2022-10-11 NOTE — Assessment & Plan Note (Signed)
Not on statin therapy due to intolerance.  Will discuss trying a low-dose medication next visit.

## 2022-10-11 NOTE — Progress Notes (Signed)
Established Patient Office Visit     CC/Reason for Visit: Follow-up chronic conditions  HPI: Darlene Mclaughlin is a 72 y.o. female who is coming in today for the above mentioned reasons. Past Medical History is significant for: Hypertension, hyperlipidemia, type 2 diabetes, lumbar spinal stenosis, morbid obesity.  Since last visit she has lost 10 pounds and has been exercising almost daily.  She is hesitant to start Ozempic that was prescribed last visit.  She is otherwise doing well.   Past Medical/Surgical History: Past Medical History:  Diagnosis Date   ALLERGIC RHINITIS 05/15/2008   Allergy    Anemia    ASTHMA 05/15/2008   Asthma    Blood transfusion without reported diagnosis 1991   anemia   Breast cancer (HCC) 2016   right breast   Cancer (HCC)    DCIS right breast   Diabetes mellitus without complication (HCC)    GERD (gastroesophageal reflux disease)    Headache(784.0) 05/15/2008   Hyperlipidemia    HYPERTENSION 05/15/2008   IMPAIRED GLUCOSE TOLERANCE 05/15/2008   Obesity    OSTEOARTHRITIS 05/15/2008   back    Past Surgical History:  Procedure Laterality Date   ABDOMINAL HYSTERECTOMY     ANTERIOR LAT LUMBAR FUSION Left 05/21/2020   Procedure: LUMBAR TWO-THREE, LUMBAR THREE-FOUR DIRECT LATERAL LUMBAR INTERBODY FUSION;  Surgeon: Bedelia Person, MD;  Location: [facility] OR;  Service: Neurosurgery;  Laterality: Left;   APPLICATION OF ROBOTIC ASSISTANCE FOR SPINAL PROCEDURE N/A 05/21/2020   Procedure: APPLICATION OF ROBOTIC ASSISTANCE FOR SPINAL PROCEDURE;  Surgeon: Bedelia Person, MD;  Location: [facility];  Service: Neurosurgery;  Laterality: N/A;   BREAST LUMPECTOMY Right 12/17/2014   BREAST LUMPECTOMY WITH RADIOACTIVE SEED LOCALIZATION Right 12/17/2014   Procedure: RIGHT BREAST RADIOACTIVE SEED GUIDED PARTIAL MASTECTOMY;  Surgeon: Harriette Bouillon, MD;  Location: [facility];  Service: General;  Laterality: Right;   CESAREAN SECTION     x2    COLONOSCOPY     KNEE SURGERY     arthroscopic left   TRANSFORAMINAL LUMBAR INTERBODY FUSION W/ MIS 2 LEVEL Right 05/21/2020   Procedure: LUMBAR FOUR-FIVE, LUMBAR FIVE-SACRAL ONE MINIMALLY INVASIVE TRANSFORAMINAL LUMBAR INTERBODY FUSION WITH INSTRUMENTATION LUMBAR TWO-SACRAL ONE;  Surgeon: Bedelia Person, MD;  Location: [facility] OR;  Service: Neurosurgery;  Laterality: Right;    Social History:  reports that she has never smoked. She has never used smokeless tobacco. She reports that she does not currently use alcohol. She reports that she does not use drugs.  Allergies: Allergies  Allergen Reactions   Atorvastatin     Hallucinations    Hydrochlorothiazide     Muscle pain   Hydrocodone     Swollen tongue   Macrobid [Nitrofurantoin Monohyd Macro]      Liver problems   Naproxen Swelling    Mouth and tongue swelling   Simvastatin     Hallucinations, muscle pains   Zanaflex [Tizanidine Hcl] Other (See Comments)     Liver problems    Family History:  Family History  Problem Relation Age of Onset   Diabetes Mother    Hypertension Mother    Breast cancer Mother 66       Breast cancer twice    Hyperlipidemia Father    Colon cancer Brother        4 th brother   Diabetes Brother    Pancreatic cancer Brother        middle brother   Stomach cancer Neg Hx  Esophageal cancer Neg Hx    Rectal cancer Neg Hx      Current Outpatient Medications:    Accu-Chek FastClix Lancets MISC, 1 EACH BY DOES NOT APPLY ROUTE DAILY. USE FOR ACCU-CHEK GUIDE, Disp: 102 each, Rfl: 3   ACCU-CHEK GUIDE test strip, USE AS DIRECTED, Disp: 100 strip, Rfl: 3   acetaminophen (TYLENOL) 650 MG CR tablet, Take 650 mg by mouth every 8 (eight) hours as needed for pain., Disp: , Rfl:    aspirin 81 MG tablet, Take 81 mg by mouth daily., Disp: , Rfl:    azelastine (OPTIVAR) 0.05 % ophthalmic solution, Place 1 drop into both eyes daily as needed (allergies)., Disp: , Rfl:    Blood Glucose Monitoring Suppl  (ACCU-CHEK GUIDE ME) w/Device KIT, USE TO CHECK BLOOD SUGAR ONCE DAILY, Disp: 1 kit, Rfl: 1   CALCIUM PO, Take 1,000 mg by mouth daily., Disp: , Rfl:    cetirizine (ZYRTEC) 10 MG tablet, Take 10 mg by mouth daily as needed for allergies., Disp: , Rfl:    chlorthalidone (HYGROTON) 25 MG tablet, TAKE 1 TABLET BY MOUTH EVERY DAY, Disp: 90 tablet, Rfl: 1   cyclobenzaprine (FLEXERIL) 10 MG tablet, Take 1 tablet (10 mg total) by mouth 3 (three) times daily as needed for muscle spasms., Disp: 60 tablet, Rfl: 0   Docusate Sodium (DSS) 100 MG CAPS, Take 1 capsule by mouth 2 (two) times daily., Disp: , Rfl:    EPIPEN 2-PAK 0.3 MG/0.3ML SOAJ injection, Inject 0.3 mg into the muscle as needed for anaphylaxis., Disp: , Rfl: 1   fluticasone (FLONASE) 50 MCG/ACT nasal spray, Place 1 spray into both nostrils daily. (Patient taking differently: Place 1 spray into both nostrils daily as needed for allergies.), Disp: 16 g, Rfl: 5   KLOR-CON M20 20 MEQ tablet, TAKE 1 TABLET BY MOUTH TWICE A DAY, Disp: 180 tablet, Rfl: 1   Melatonin 10 MG TABS, Take 10 mg by mouth at bedtime as needed (sleep)., Disp: , Rfl:    metFORMIN (GLUCOPHAGE-XR) 500 MG 24 hr tablet, TAKE 2 TABLETS (1,000 MG TOTAL) BY MOUTH DAILY WITH SUPPER, Disp: 180 tablet, Rfl: 1   Multiple Vitamin (MULTIVITAMIN) tablet, Take 1 tablet by mouth daily., Disp: , Rfl:    neomycin-polymyxin-hydrocortisone (CORTISPORIN) 3.5-10000-1 OTIC suspension, APPLY 1-2 DROPS DAILY AFTER SOAKING AND COVER WITH BANDAID, Disp: 10 mL, Rfl: 0   NON FORMULARY, EVERY OTHER WEEK ALLERGY INJECTIONS, Disp: , Rfl:    PROAIR HFA 108 (90 BASE) MCG/ACT inhaler, Inhale 1 puff into the lungs every 6 (six) hours as needed for shortness of breath or wheezing., Disp: , Rfl: 0   rosuvastatin (CRESTOR) 5 MG tablet, Take 1 tablet (5 mg total) by mouth every Monday, Wednesday, and Friday., Disp: 90 tablet, Rfl: 1   Simethicone (GAS-X PO), Take 1 tablet by mouth daily as needed (gas)., Disp: , Rfl:     traMADol (ULTRAM) 50 MG tablet, Take 1 tablet by mouth every 6 (six) hours as needed., Disp: , Rfl:   Review of Systems:  Negative unless indicated in HPI.   Physical Exam: Vitals:   10/11/22 0958  BP: 120/80  Pulse: 100  Temp: 98.3 F (36.8 C)  TempSrc: Oral  SpO2: 99%  Weight: 244 lb 12.8 oz (111 kg)    Body mass index is 40.74 kg/m.   Physical Exam Vitals reviewed.  Constitutional:      Appearance: Normal appearance.  HENT:     Head: Normocephalic and atraumatic.  Eyes:  Conjunctiva/sclera: Conjunctivae normal.     Pupils: Pupils are equal, round, and reactive to light.  Cardiovascular:     Rate and Rhythm: Normal rate and regular rhythm.  Pulmonary:     Effort: Pulmonary effort is normal.     Breath sounds: Normal breath sounds.  Skin:    General: Skin is warm and dry.  Neurological:     General: No focal deficit present.     Mental Status: She is alert and oriented to person, place, and time.  Psychiatric:        Mood and Affect: Mood normal.        Behavior: Behavior normal.        Thought Content: Thought content normal.        Judgment: Judgment normal.      Impression and Plan:  Diabetes mellitus with coincident hypertension (HCC) Assessment & Plan: She declines to start Ozempic that was recommended at last visit.  With lifestyle changes and weight loss her A1c decreased from 8.2 in May to 7.7 today.  She will continue to work on lifestyle changes and we will reassess in 3 months.  Orders: -     POCT glycosylated hemoglobin (Hb A1C)  Spinal stenosis of lumbar region, unspecified whether neurogenic claudication present Assessment & Plan: Improved with physical activity.   Essential hypertension Assessment & Plan: Well-controlled on current.   Dyslipidemia Assessment & Plan: Not on statin therapy due to intolerance.  Will discuss trying a low-dose medication next visit.   Morbid obesity (HCC) Assessment & Plan: -Discussed healthy  lifestyle, including increased physical activity and better food choices to promote weight loss. -Congratulated on weight loss success thus far.      Time spent:33 minutes reviewing chart, interviewing and examining patient and formulating plan of care.     Chaya Jan, MD [facility] at [facility]

## 2022-10-11 NOTE — Assessment & Plan Note (Signed)
She declines to start Ozempic that was recommended at last visit.  With lifestyle changes and weight loss her A1c decreased from 8.2 in May to 7.7 today.  She will continue to work on lifestyle changes and we will reassess in 3 months.

## 2022-10-19 DIAGNOSIS — J3089 Other allergic rhinitis: Secondary | ICD-10-CM | POA: Diagnosis not present

## 2022-10-19 DIAGNOSIS — J301 Allergic rhinitis due to pollen: Secondary | ICD-10-CM | POA: Diagnosis not present

## 2022-10-27 DIAGNOSIS — J3081 Allergic rhinitis due to animal (cat) (dog) hair and dander: Secondary | ICD-10-CM | POA: Diagnosis not present

## 2022-10-27 DIAGNOSIS — J3089 Other allergic rhinitis: Secondary | ICD-10-CM | POA: Diagnosis not present

## 2022-10-27 DIAGNOSIS — J301 Allergic rhinitis due to pollen: Secondary | ICD-10-CM | POA: Diagnosis not present

## 2022-11-05 ENCOUNTER — Ambulatory Visit: Payer: Medicare PPO

## 2022-11-05 DIAGNOSIS — Z23 Encounter for immunization: Secondary | ICD-10-CM

## 2022-11-10 DIAGNOSIS — J301 Allergic rhinitis due to pollen: Secondary | ICD-10-CM | POA: Diagnosis not present

## 2022-11-10 DIAGNOSIS — J3089 Other allergic rhinitis: Secondary | ICD-10-CM | POA: Diagnosis not present

## 2022-11-10 DIAGNOSIS — J3081 Allergic rhinitis due to animal (cat) (dog) hair and dander: Secondary | ICD-10-CM | POA: Diagnosis not present

## 2022-11-14 ENCOUNTER — Other Ambulatory Visit: Payer: Self-pay | Admitting: Internal Medicine

## 2022-11-18 DIAGNOSIS — J3089 Other allergic rhinitis: Secondary | ICD-10-CM | POA: Diagnosis not present

## 2022-11-18 DIAGNOSIS — J3081 Allergic rhinitis due to animal (cat) (dog) hair and dander: Secondary | ICD-10-CM | POA: Diagnosis not present

## 2022-11-18 DIAGNOSIS — J301 Allergic rhinitis due to pollen: Secondary | ICD-10-CM | POA: Diagnosis not present

## 2022-11-22 ENCOUNTER — Ambulatory Visit (INDEPENDENT_AMBULATORY_CARE_PROVIDER_SITE_OTHER): Payer: Medicare PPO

## 2022-11-22 DIAGNOSIS — Z23 Encounter for immunization: Secondary | ICD-10-CM

## 2022-11-26 DIAGNOSIS — J3089 Other allergic rhinitis: Secondary | ICD-10-CM | POA: Diagnosis not present

## 2022-11-26 DIAGNOSIS — J301 Allergic rhinitis due to pollen: Secondary | ICD-10-CM | POA: Diagnosis not present

## 2022-11-26 DIAGNOSIS — J3081 Allergic rhinitis due to animal (cat) (dog) hair and dander: Secondary | ICD-10-CM | POA: Diagnosis not present

## 2022-11-30 ENCOUNTER — Other Ambulatory Visit: Payer: Self-pay

## 2022-11-30 DIAGNOSIS — D0511 Intraductal carcinoma in situ of right breast: Secondary | ICD-10-CM

## 2022-12-01 ENCOUNTER — Inpatient Hospital Stay: Payer: Medicare PPO

## 2022-12-01 ENCOUNTER — Telehealth: Payer: Self-pay

## 2022-12-01 ENCOUNTER — Telehealth: Payer: Self-pay | Admitting: Nurse Practitioner

## 2022-12-01 ENCOUNTER — Inpatient Hospital Stay: Payer: Medicare PPO | Admitting: Nurse Practitioner

## 2022-12-01 NOTE — Telephone Encounter (Signed)
Called patient due to being late for appointment today. She stated she got her days mixed up. I told her I would call scheduling and have them contact her about rescheduling it for her. Sent a message to Erie Noe to contact patient and reschedule. Made Lacie aware of patient not coming in today.

## 2022-12-01 NOTE — Progress Notes (Deleted)
Patient Care Team: Philip Aspen, Limmie Patricia, MD as PCP - General (Internal Medicine) Harriette Bouillon, MD as Consulting Physician (General Surgery) Chipper Herb, MD (Inactive) as Consulting Physician (Radiation Oncology) Malachy Mood, MD as Consulting Physician (Hematology) Salomon Fick, NP as Nurse Practitioner (Hematology and Oncology)   CHIEF COMPLAINT: Follow up right breast DCIS  Oncology History Overview Note  Breast cancer of lower-outer quadrant of right female breast [facility])   Staging form: Breast, AJCC 7th Edition     Clinical stage from 11/15/2014: Stage 0 (Tis (DCIS), N0, M0) - Signed by Malachy Mood, MD on 11/26/2014     Ductal carcinoma in situ (DCIS) of right breast  11/06/2014 Mammogram   Screening mammogram showed calcifications in the lower outer quadrant of the right breast, which was confirmed by diagnostic mammogram   11/15/2014 Initial Biopsy   Breast core needle biopsy showed ductal carcinoma in situ with calcifications.   11/15/2014 Receptors her2   ER+ (100%); PR+ (90%)   11/15/2014 Clinical Stage   Stage 0: Tis N0   12/17/2014 Surgery   Right breast lumpectomy.   12/17/2014 Pathology Results   Right breast lumpectomy showed usual ductal Hyperplasia and fibroadenoma. No residual ductal carcinoma in situ.     Radiation Therapy   Not recommended due to lack of residual malignancy at time of definitive surgery   03/28/2015 - 08/2017 Anti-estrogen oral therapy   Anastrozole 1 mg daily begun 03/28/2015 and stopped 08/2017 due to poor tolerance.    05/01/2015 Survivorship   Survivorship visit completed and copy of care plan provided to patient   11/06/2015 Imaging   MM DIAG BREAST TOMO BILATERAL 11/06/15 IMPRESSION: No mammographic evidence of malignancy. BI-RADS CATEGORY  2: Benign.   08/04/2016 Pathology Results   Diagnosis  Surgical [P], cecum, polyp - TUBULAR ADENOMA. - NO HIGH GRADE DYSPLASIA OR MALIGNANCY.   08/04/2016 Procedure    Colonoscopy A 2 mm polyp was found in the cecum. The polyp was sessile. The polyp was removed with a cold snare. Resection and retrieval were complete. Findings: - The exam was otherwise without abnormality on direct and retroflexion views.   11/11/2016 Mammogram   IMPRESSION: 1. No mammographic evidence of malignancy. 2. Stable right breast postsurgical changes.      CURRENT THERAPY: Surveillance  INTERVAL HISTORY Ms. Manocchio returns for follow up as scheduled. Last seen by me 12/02/21.   ROS   Past Medical History:  Diagnosis Date   ALLERGIC RHINITIS 05/15/2008   Allergy    Anemia    ASTHMA 05/15/2008   Asthma    Blood transfusion without reported diagnosis 1991   anemia   Breast cancer (HCC) 2016   right breast   Cancer (HCC)    DCIS right breast   Diabetes mellitus without complication (HCC)    GERD (gastroesophageal reflux disease)    Headache(784.0) 05/15/2008   Hyperlipidemia    HYPERTENSION 05/15/2008   IMPAIRED GLUCOSE TOLERANCE 05/15/2008   Obesity    OSTEOARTHRITIS 05/15/2008   back     Past Surgical History:  Procedure Laterality Date   ABDOMINAL HYSTERECTOMY     ANTERIOR LAT LUMBAR FUSION Left 05/21/2020   Procedure: LUMBAR TWO-THREE, LUMBAR THREE-FOUR DIRECT LATERAL LUMBAR INTERBODY FUSION;  Surgeon: Bedelia Person, MD;  Location: [facility] OR;  Service: Neurosurgery;  Laterality: Left;   APPLICATION OF ROBOTIC ASSISTANCE FOR SPINAL PROCEDURE N/A 05/21/2020   Procedure: APPLICATION OF ROBOTIC ASSISTANCE FOR SPINAL PROCEDURE;  Surgeon: Bedelia Person, MD;  Location: [facility]  OR;  Service: Neurosurgery;  Laterality: N/A;   BREAST LUMPECTOMY Right 12/17/2014   BREAST LUMPECTOMY WITH RADIOACTIVE SEED LOCALIZATION Right 12/17/2014   Procedure: RIGHT BREAST RADIOACTIVE SEED GUIDED PARTIAL MASTECTOMY;  Surgeon: Harriette Bouillon, MD;  Location: [facility];  Service: General;  Laterality: Right;   CESAREAN SECTION     x2   COLONOSCOPY     KNEE  SURGERY     arthroscopic left   TRANSFORAMINAL LUMBAR INTERBODY FUSION W/ MIS 2 LEVEL Right 05/21/2020   Procedure: LUMBAR FOUR-FIVE, LUMBAR FIVE-SACRAL ONE MINIMALLY INVASIVE TRANSFORAMINAL LUMBAR INTERBODY FUSION WITH INSTRUMENTATION LUMBAR TWO-SACRAL ONE;  Surgeon: Bedelia Person, MD;  Location: [facility] OR;  Service: Neurosurgery;  Laterality: Right;     Outpatient Encounter Medications as of 12/01/2022  Medication Sig   Accu-Chek FastClix Lancets MISC 1 EACH BY DOES NOT APPLY ROUTE DAILY. USE FOR ACCU-CHEK GUIDE   ACCU-CHEK GUIDE test strip USE AS DIRECTED   acetaminophen (TYLENOL) 650 MG CR tablet Take 650 mg by mouth every 8 (eight) hours as needed for pain.   aspirin 81 MG tablet Take 81 mg by mouth daily.   azelastine (OPTIVAR) 0.05 % ophthalmic solution Place 1 drop into both eyes daily as needed (allergies).   Blood Glucose Monitoring Suppl (ACCU-CHEK GUIDE ME) w/Device KIT USE TO CHECK BLOOD SUGAR ONCE DAILY   CALCIUM PO Take 1,000 mg by mouth daily.   cetirizine (ZYRTEC) 10 MG tablet Take 10 mg by mouth daily as needed for allergies.   chlorthalidone (HYGROTON) 25 MG tablet TAKE 1 TABLET BY MOUTH EVERY DAY   cyclobenzaprine (FLEXERIL) 10 MG tablet Take 1 tablet (10 mg total) by mouth 3 (three) times daily as needed for muscle spasms.   Docusate Sodium (DSS) 100 MG CAPS Take 1 capsule by mouth 2 (two) times daily.   EPIPEN 2-PAK 0.3 MG/0.3ML SOAJ injection Inject 0.3 mg into the muscle as needed for anaphylaxis.   fluticasone (FLONASE) 50 MCG/ACT nasal spray Place 1 spray into both nostrils daily. (Patient taking differently: Place 1 spray into both nostrils daily as needed for allergies.)   KLOR-CON M20 20 MEQ tablet TAKE 1 TABLET BY MOUTH TWICE A DAY   Melatonin 10 MG TABS Take 10 mg by mouth at bedtime as needed (sleep).   metFORMIN (GLUCOPHAGE-XR) 500 MG 24 hr tablet TAKE 2 TABLETS (1,000 MG TOTAL) BY MOUTH DAILY WITH SUPPER   Multiple Vitamin (MULTIVITAMIN) tablet Take 1 tablet  by mouth daily.   neomycin-polymyxin-hydrocortisone (CORTISPORIN) 3.5-10000-1 OTIC suspension APPLY 1-2 DROPS DAILY AFTER SOAKING AND COVER WITH BANDAID   NON FORMULARY EVERY OTHER WEEK ALLERGY INJECTIONS   PROAIR HFA 108 (90 BASE) MCG/ACT inhaler Inhale 1 puff into the lungs every 6 (six) hours as needed for shortness of breath or wheezing.   rosuvastatin (CRESTOR) 5 MG tablet Take 1 tablet (5 mg total) by mouth every Monday, Wednesday, and Friday.   Simethicone (GAS-X PO) Take 1 tablet by mouth daily as needed (gas).   traMADol (ULTRAM) 50 MG tablet Take 1 tablet by mouth every 6 (six) hours as needed.   No facility-administered encounter medications on file as of 12/01/2022.     There were no vitals filed for this visit. There is no height or weight on file to calculate BMI.   PHYSICAL EXAM GENERAL:alert, no distress and comfortable SKIN: no rash  EYES: sclera clear NECK: without mass LYMPH:  no palpable cervical or supraclavicular lymphadenopathy  LUNGS: clear with normal breathing effort HEART: regular rate &  rhythm, no lower extremity edema ABDOMEN: abdomen soft, non-tender and normal bowel sounds NEURO: alert & oriented x 3 with fluent speech, no focal motor/sensory deficits Breast exam:  PAC without erythema    CBC    Component Value Date/Time   WBC 6.2 04/06/2022 0844   RBC 4.29 04/06/2022 0844   HGB 12.9 04/06/2022 0844   HGB 12.9 08/30/2016 0910   HCT 39.2 04/06/2022 0844   HCT 38.9 08/30/2016 0910   PLT 189.0 04/06/2022 0844   PLT 187 08/30/2016 0910   MCV 91.2 04/06/2022 0844   MCV 92.8 08/30/2016 0910   MCH 31.0 05/19/2020 1108   MCHC 33.1 04/06/2022 0844   RDW 14.3 04/06/2022 0844   RDW 12.9 08/30/2016 0910   LYMPHSABS 1.7 04/06/2022 0844   LYMPHSABS 1.9 08/30/2016 0910   MONOABS 0.4 04/06/2022 0844   MONOABS 0.6 08/30/2016 0910   EOSABS 0.1 04/06/2022 0844   EOSABS 0.2 08/30/2016 0910   BASOSABS 0.0 04/06/2022 0844   BASOSABS 0.0 08/30/2016 0910      CMP     Component Value Date/Time   NA 138 04/06/2022 0844   NA 140 08/30/2016 0910   K 3.5 04/06/2022 0844   K 3.4 (L) 08/30/2016 0910   CL 99 04/06/2022 0844   CO2 28 04/06/2022 0844   CO2 29 08/30/2016 0910   GLUCOSE 165 (H) 04/06/2022 0844   GLUCOSE 118 08/30/2016 0910   BUN 14 04/06/2022 0844   BUN 16.1 08/30/2016 0910   CREATININE 0.61 04/06/2022 0844   CREATININE 0.67 11/28/2019 0738   CREATININE 0.7 08/30/2016 0910   CALCIUM 9.7 04/06/2022 0844   CALCIUM 10.0 08/30/2016 0910   PROT 7.6 04/06/2022 0844   PROT 7.9 08/30/2016 0910   ALBUMIN 4.2 04/06/2022 0844   ALBUMIN 3.8 08/30/2016 0910   AST 15 04/06/2022 0844   AST 17 08/30/2016 0910   ALT 13 04/06/2022 0844   ALT 15 08/30/2016 0910   ALKPHOS 76 04/06/2022 0844   ALKPHOS 82 08/30/2016 0910   BILITOT 0.4 04/06/2022 0844   BILITOT 0.35 08/30/2016 0910   GFRNONAA >60 05/19/2020 1108   GFRAA >60 09/01/2017 0851     ASSESSMENT & PLAN: Darlene Mclaughlin is a 72 y.o. female with    1. right breast DCIS, ER/PR strongly positive -Diagnosed in 10/2014. S/p right breast lumpectomy. Adjuvant radiation not recommended due to lack of residual malignancy at time of definitive surgery -Given her ER/PR positive disease, she started on anastrozole for chemoprevention in 03/2015 but stopped in July 2019 due to worsening arthralgia.  -01/05/22 mammogram was benign    2. Bone Health -DEXA in February 2017 showed osteopenia, no high risk for fracture.  She was on calcium, multivitamin, and vitamin D -11/2017 DEXA improved and normal with lowest t-score -0.9 -07/22/2021 DEXA was normal T score -0.8   3.  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  5-year recall      PLAN:  No  orders of the defined types were placed in this encounter.     All questions were answered. The patient knows to call the clinic with any problems, questions or concerns. No barriers to learning were detected. I spent *** counseling the patient face to face. The total time spent in the appointment was *** and more than 50% was on counseling, review of test results, and coordination of care.   Santiago Glad, NP-C @DATE @

## 2022-12-02 DIAGNOSIS — J3089 Other allergic rhinitis: Secondary | ICD-10-CM | POA: Diagnosis not present

## 2022-12-02 DIAGNOSIS — J3081 Allergic rhinitis due to animal (cat) (dog) hair and dander: Secondary | ICD-10-CM | POA: Diagnosis not present

## 2022-12-02 DIAGNOSIS — J301 Allergic rhinitis due to pollen: Secondary | ICD-10-CM | POA: Diagnosis not present

## 2022-12-04 NOTE — Progress Notes (Deleted)
Patient Care Team: Philip Aspen, Limmie Patricia, MD as PCP - General (Internal Medicine) Harriette Bouillon, MD as Consulting Physician (General Surgery) Chipper Herb, MD (Inactive) as Consulting Physician (Radiation Oncology) Malachy Mood, MD as Consulting Physician (Hematology) Salomon Fick, NP as Nurse Practitioner (Hematology and Oncology)   CHIEF COMPLAINT: Follow up right breast DCIS  Oncology History Overview Note  Breast cancer of lower-outer quadrant of right female breast [facility])   Staging form: Breast, AJCC 7th Edition     Clinical stage from 11/15/2014: Stage 0 (Tis (DCIS), N0, M0) - Signed by Malachy Mood, MD on 11/26/2014     Ductal carcinoma in situ (DCIS) of right breast  11/06/2014 Mammogram   Screening mammogram showed calcifications in the lower outer quadrant of the right breast, which was confirmed by diagnostic mammogram   11/15/2014 Initial Biopsy   Breast core needle biopsy showed ductal carcinoma in situ with calcifications.   11/15/2014 Receptors her2   ER+ (100%); PR+ (90%)   11/15/2014 Clinical Stage   Stage 0: Tis N0   12/17/2014 Surgery   Right breast lumpectomy.   12/17/2014 Pathology Results   Right breast lumpectomy showed usual ductal Hyperplasia and fibroadenoma. No residual ductal carcinoma in situ.     Radiation Therapy   Not recommended due to lack of residual malignancy at time of definitive surgery   03/28/2015 - 08/2017 Anti-estrogen oral therapy   Anastrozole 1 mg daily begun 03/28/2015 and stopped 08/2017 due to poor tolerance.    05/01/2015 Survivorship   Survivorship visit completed and copy of care plan provided to patient   11/06/2015 Imaging   MM DIAG BREAST TOMO BILATERAL 11/06/15 IMPRESSION: No mammographic evidence of malignancy. BI-RADS CATEGORY  2: Benign.   08/04/2016 Pathology Results   Diagnosis  Surgical [P], cecum, polyp - TUBULAR ADENOMA. - NO HIGH GRADE DYSPLASIA OR MALIGNANCY.   08/04/2016 Procedure    Colonoscopy A 2 mm polyp was found in the cecum. The polyp was sessile. The polyp was removed with a cold snare. Resection and retrieval were complete. Findings: - The exam was otherwise without abnormality on direct and retroflexion views.   11/11/2016 Mammogram   IMPRESSION: 1. No mammographic evidence of malignancy. 2. Stable right breast postsurgical changes.      CURRENT THERAPY: Surveillance  INTERVAL HISTORY Darlene Mclaughlin returns for follow up as scheduled. Last seen by me 12/02/21.   ROS   Past Medical History:  Diagnosis Date   ALLERGIC RHINITIS 05/15/2008   Allergy    Anemia    ASTHMA 05/15/2008   Asthma    Blood transfusion without reported diagnosis 1991   anemia   Breast cancer (HCC) 2016   right breast   Cancer (HCC)    DCIS right breast   Diabetes mellitus without complication (HCC)    GERD (gastroesophageal reflux disease)    Headache(784.0) 05/15/2008   Hyperlipidemia    HYPERTENSION 05/15/2008   IMPAIRED GLUCOSE TOLERANCE 05/15/2008   Obesity    OSTEOARTHRITIS 05/15/2008   back     Past Surgical History:  Procedure Laterality Date   ABDOMINAL HYSTERECTOMY     ANTERIOR LAT LUMBAR FUSION Left 05/21/2020   Procedure: LUMBAR TWO-THREE, LUMBAR THREE-FOUR DIRECT LATERAL LUMBAR INTERBODY FUSION;  Surgeon: Bedelia Person, MD;  Location: [facility] OR;  Service: Neurosurgery;  Laterality: Left;   APPLICATION OF ROBOTIC ASSISTANCE FOR SPINAL PROCEDURE N/A 05/21/2020   Procedure: APPLICATION OF ROBOTIC ASSISTANCE FOR SPINAL PROCEDURE;  Surgeon: Bedelia Person, MD;  Location: [facility]  OR;  Service: Neurosurgery;  Laterality: N/A;   BREAST LUMPECTOMY Right 12/17/2014   BREAST LUMPECTOMY WITH RADIOACTIVE SEED LOCALIZATION Right 12/17/2014   Procedure: RIGHT BREAST RADIOACTIVE SEED GUIDED PARTIAL MASTECTOMY;  Surgeon: Harriette Bouillon, MD;  Location: [facility];  Service: General;  Laterality: Right;   CESAREAN SECTION     x2   COLONOSCOPY     KNEE  SURGERY     arthroscopic left   TRANSFORAMINAL LUMBAR INTERBODY FUSION W/ MIS 2 LEVEL Right 05/21/2020   Procedure: LUMBAR FOUR-FIVE, LUMBAR FIVE-SACRAL ONE MINIMALLY INVASIVE TRANSFORAMINAL LUMBAR INTERBODY FUSION WITH INSTRUMENTATION LUMBAR TWO-SACRAL ONE;  Surgeon: Bedelia Person, MD;  Location: [facility] OR;  Service: Neurosurgery;  Laterality: Right;     Outpatient Encounter Medications as of 12/06/2022  Medication Sig   Accu-Chek FastClix Lancets MISC 1 EACH BY DOES NOT APPLY ROUTE DAILY. USE FOR ACCU-CHEK GUIDE   ACCU-CHEK GUIDE test strip USE AS DIRECTED   acetaminophen (TYLENOL) 650 MG CR tablet Take 650 mg by mouth every 8 (eight) hours as needed for pain.   aspirin 81 MG tablet Take 81 mg by mouth daily.   azelastine (OPTIVAR) 0.05 % ophthalmic solution Place 1 drop into both eyes daily as needed (allergies).   Blood Glucose Monitoring Suppl (ACCU-CHEK GUIDE ME) w/Device KIT USE TO CHECK BLOOD SUGAR ONCE DAILY   CALCIUM PO Take 1,000 mg by mouth daily.   cetirizine (ZYRTEC) 10 MG tablet Take 10 mg by mouth daily as needed for allergies.   chlorthalidone (HYGROTON) 25 MG tablet TAKE 1 TABLET BY MOUTH EVERY DAY   cyclobenzaprine (FLEXERIL) 10 MG tablet Take 1 tablet (10 mg total) by mouth 3 (three) times daily as needed for muscle spasms.   Docusate Sodium (DSS) 100 MG CAPS Take 1 capsule by mouth 2 (two) times daily.   EPIPEN 2-PAK 0.3 MG/0.3ML SOAJ injection Inject 0.3 mg into the muscle as needed for anaphylaxis.   fluticasone (FLONASE) 50 MCG/ACT nasal spray Place 1 spray into both nostrils daily. (Patient taking differently: Place 1 spray into both nostrils daily as needed for allergies.)   KLOR-CON M20 20 MEQ tablet TAKE 1 TABLET BY MOUTH TWICE A DAY   Melatonin 10 MG TABS Take 10 mg by mouth at bedtime as needed (sleep).   metFORMIN (GLUCOPHAGE-XR) 500 MG 24 hr tablet TAKE 2 TABLETS (1,000 MG TOTAL) BY MOUTH DAILY WITH SUPPER   Multiple Vitamin (MULTIVITAMIN) tablet Take 1 tablet  by mouth daily.   neomycin-polymyxin-hydrocortisone (CORTISPORIN) 3.5-10000-1 OTIC suspension APPLY 1-2 DROPS DAILY AFTER SOAKING AND COVER WITH BANDAID   NON FORMULARY EVERY OTHER WEEK ALLERGY INJECTIONS   PROAIR HFA 108 (90 BASE) MCG/ACT inhaler Inhale 1 puff into the lungs every 6 (six) hours as needed for shortness of breath or wheezing.   rosuvastatin (CRESTOR) 5 MG tablet Take 1 tablet (5 mg total) by mouth every Monday, Wednesday, and Friday.   Simethicone (GAS-X PO) Take 1 tablet by mouth daily as needed (gas).   traMADol (ULTRAM) 50 MG tablet Take 1 tablet by mouth every 6 (six) hours as needed.   No facility-administered encounter medications on file as of 12/06/2022.     There were no vitals filed for this visit. There is no height or weight on file to calculate BMI.   PHYSICAL EXAM GENERAL:alert, no distress and comfortable SKIN: no rash  EYES: sclera clear NECK: without mass LYMPH:  no palpable cervical or supraclavicular lymphadenopathy  LUNGS: clear with normal breathing effort HEART: regular rate &  rhythm, no lower extremity edema ABDOMEN: abdomen soft, non-tender and normal bowel sounds NEURO: alert & oriented x 3 with fluent speech, no focal motor/sensory deficits Breast exam:  PAC without erythema    CBC    Component Value Date/Time   WBC 6.2 04/06/2022 0844   RBC 4.29 04/06/2022 0844   HGB 12.9 04/06/2022 0844   HGB 12.9 08/30/2016 0910   HCT 39.2 04/06/2022 0844   HCT 38.9 08/30/2016 0910   PLT 189.0 04/06/2022 0844   PLT 187 08/30/2016 0910   MCV 91.2 04/06/2022 0844   MCV 92.8 08/30/2016 0910   MCH 31.0 05/19/2020 1108   MCHC 33.1 04/06/2022 0844   RDW 14.3 04/06/2022 0844   RDW 12.9 08/30/2016 0910   LYMPHSABS 1.7 04/06/2022 0844   LYMPHSABS 1.9 08/30/2016 0910   MONOABS 0.4 04/06/2022 0844   MONOABS 0.6 08/30/2016 0910   EOSABS 0.1 04/06/2022 0844   EOSABS 0.2 08/30/2016 0910   BASOSABS 0.0 04/06/2022 0844   BASOSABS 0.0 08/30/2016 0910      CMP     Component Value Date/Time   NA 138 04/06/2022 0844   NA 140 08/30/2016 0910   K 3.5 04/06/2022 0844   K 3.4 (L) 08/30/2016 0910   CL 99 04/06/2022 0844   CO2 28 04/06/2022 0844   CO2 29 08/30/2016 0910   GLUCOSE 165 (H) 04/06/2022 0844   GLUCOSE 118 08/30/2016 0910   BUN 14 04/06/2022 0844   BUN 16.1 08/30/2016 0910   CREATININE 0.61 04/06/2022 0844   CREATININE 0.67 11/28/2019 0738   CREATININE 0.7 08/30/2016 0910   CALCIUM 9.7 04/06/2022 0844   CALCIUM 10.0 08/30/2016 0910   PROT 7.6 04/06/2022 0844   PROT 7.9 08/30/2016 0910   ALBUMIN 4.2 04/06/2022 0844   ALBUMIN 3.8 08/30/2016 0910   AST 15 04/06/2022 0844   AST 17 08/30/2016 0910   ALT 13 04/06/2022 0844   ALT 15 08/30/2016 0910   ALKPHOS 76 04/06/2022 0844   ALKPHOS 82 08/30/2016 0910   BILITOT 0.4 04/06/2022 0844   BILITOT 0.35 08/30/2016 0910   GFRNONAA >60 05/19/2020 1108   GFRAA >60 09/01/2017 0851     ASSESSMENT & PLAN: Darlene Mclaughlin is a 71 y.o. female with    1. right breast DCIS, ER/PR strongly positive -Diagnosed in 10/2014. S/p right breast lumpectomy. Adjuvant radiation not recommended due to lack of residual malignancy at time of definitive surgery -Given her ER/PR positive disease, she started on anastrozole for chemoprevention in 03/2015 but stopped in July 2019 due to worsening arthralgia.  -01/05/22 mammogram was benign    2. Bone Health -DEXA in February 2017 showed osteopenia, no high risk for fracture.  She was on calcium, multivitamin, and vitamin D -11/2017 DEXA improved and normal with lowest t-score -0.9 -07/22/2021 DEXA was normal T score -0.8   3.  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  5-year recall      PLAN:  No  orders of the defined types were placed in this encounter.     All questions were answered. The patient knows to call the clinic with any problems, questions or concerns. No barriers to learning were detected. I spent *** counseling the patient face to face. The total time spent in the appointment was *** and more than 50% was on counseling, review of test results, and coordination of care.   Darlene Glad, NP-C @DATE @

## 2022-12-06 ENCOUNTER — Ambulatory Visit: Payer: Medicare PPO | Admitting: Nurse Practitioner

## 2022-12-06 ENCOUNTER — Other Ambulatory Visit: Payer: Medicare PPO

## 2022-12-06 ENCOUNTER — Telehealth: Payer: Self-pay | Admitting: Nurse Practitioner

## 2022-12-07 DIAGNOSIS — J301 Allergic rhinitis due to pollen: Secondary | ICD-10-CM | POA: Diagnosis not present

## 2022-12-07 DIAGNOSIS — J3081 Allergic rhinitis due to animal (cat) (dog) hair and dander: Secondary | ICD-10-CM | POA: Diagnosis not present

## 2022-12-07 DIAGNOSIS — J3089 Other allergic rhinitis: Secondary | ICD-10-CM | POA: Diagnosis not present

## 2022-12-07 NOTE — Progress Notes (Unsigned)
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 S/p right breast lumpectomy. Adjuvant radiation not recommended due to lack of residual malignancy at time of definitive surgery -Given her ER/PR positive disease, she started on anastrozole for chemoprevention in 03/2015 but stopped in July 2019 due to worsening arthralgia.  -11/2020 mammogram negative -07/22/2021 - DEXA scan shows normal bone density. -01/05/2022 - benign mammogram -She prefers to continue annual surveillance in our clinic, follow-up in 1 year or sooner if needed    Plan: Labs reviewed  -CBC showing WBC 5.8; Hgb 12.9; Hct 38.5; Plt 200; Anc 3.1 -CMP - K 3.2; glucose 174; BUN 16; Creatinine 0.61; eGFR >60; Ca 9.6; LFTs normal.   New 3D screening mammogram ordered for 12/2022.  Labs and follow up to be scheduled in 1 year.   The patient understands the plans discussed today and is in agreement with them.  She knows to contact our office if she develops concerns prior to her next appointment.  I provided 25 minutes of face-to-face time during this encounter and > 50% was spent counseling as documented under my assessment and plan.    Carlean Jews, NP  [facility] [address] [place] [address] Dept: [phone] Dept Fax: [phone]   Orders Placed This Encounter  Procedures   MM 3D SCREENING MAMMOGRAM BILATERAL BREAST     Standing Status:   Future    Standing Expiration Date:   12/08/2023    Order Specific Question:   Reason for Exam (SYMPTOM  OR DIAGNOSIS REQUIRED)    Answer:   screening for breast cancer. hx of right breast DCIS    Order Specific Question:   Preferred imaging location?    Answer:   [facility]      CHIEF COMPLAINT:  CC: ductal carcinoma in situ of right breast   Current Treatment:  surveillance   INTERVAL HISTORY:  Darlene Mclaughlin is here today for repeat clinical assessment. She denies fevers or chills. She denies pain. Her appetite is good. Her weight has increased 2 pounds over last year . Last seen by [name], NP on 12/02/2021. DEXA Scan done 07/22/2021 showed normal bone density. Will be due again 06/2023. Most recent mammogram done 01/05/2022. She has category B breast density and overall, benign appearing mammogram. She will need to have screening 3D mammogram 12/2022. She is doing well without complaint today.   I have reviewed the past medical history, past surgical history, social history and family history with the patient and they are unchanged from previous note.  ALLERGIES:  is allergic to atorvastatin, hydrochlorothiazide, hydrocodone, macrobid [nitrofurantoin monohyd macro], naproxen, simvastatin, and zanaflex [tizanidine hcl].  MEDICATIONS:  Current Outpatient Medications  Medication Sig Dispense Refill   Accu-Chek FastClix Lancets MISC 1 EACH BY DOES NOT APPLY ROUTE DAILY. USE FOR ACCU-CHEK GUIDE 102 each 3   ACCU-CHEK GUIDE test strip USE AS DIRECTED 100 strip 3   acetaminophen (TYLENOL) 650 MG CR tablet Take 650 mg by mouth every 8 (eight) hours as needed  for pain.     aspirin 81 MG tablet Take 81 mg by mouth daily.     azelastine (OPTIVAR) 0.05 % ophthalmic solution Place 1 drop into both eyes daily as needed (allergies).     Blood Glucose Monitoring Suppl (ACCU-CHEK GUIDE ME) w/Device KIT USE TO CHECK BLOOD SUGAR ONCE DAILY 1 kit 1   CALCIUM PO Take 1,000 mg by mouth  daily.     cetirizine (ZYRTEC) 10 MG tablet Take 10 mg by mouth daily as needed for allergies.     chlorthalidone (HYGROTON) 25 MG tablet TAKE 1 TABLET BY MOUTH EVERY DAY 90 tablet 1   cyclobenzaprine (FLEXERIL) 10 MG tablet Take 1 tablet (10 mg total) by mouth 3 (three) times daily as needed for muscle spasms. 60 tablet 0   Docusate Sodium (DSS) 100 MG CAPS Take 1 capsule by mouth 2 (two) times daily.     EPIPEN 2-PAK 0.3 MG/0.3ML SOAJ injection Inject 0.3 mg into the muscle as needed for anaphylaxis.  1   fluticasone (FLONASE) 50 MCG/ACT nasal spray Place 1 spray into both nostrils daily. (Patient taking differently: Place 1 spray into both nostrils daily as needed for allergies.) 16 g 5   KLOR-CON M20 20 MEQ tablet TAKE 1 TABLET BY MOUTH TWICE A DAY 180 tablet 1   Melatonin 10 MG TABS Take 10 mg by mouth at bedtime as needed (sleep).     metFORMIN (GLUCOPHAGE-XR) 500 MG 24 hr tablet TAKE 2 TABLETS (1,000 MG TOTAL) BY MOUTH DAILY WITH SUPPER 180 tablet 1   Multiple Vitamin (MULTIVITAMIN) tablet Take 1 tablet by mouth daily.     NON FORMULARY EVERY OTHER WEEK ALLERGY INJECTIONS     PROAIR HFA 108 (90 BASE) MCG/ACT inhaler Inhale 1 puff into the lungs every 6 (six) hours as needed for shortness of breath or wheezing.  0   Simethicone (GAS-X PO) Take 1 tablet by mouth daily as needed (gas).     traMADol (ULTRAM) 50 MG tablet Take 1 tablet by mouth every 6 (six) hours as needed.     neomycin-polymyxin-hydrocortisone (CORTISPORIN) 3.5-10000-1 OTIC suspension APPLY 1-2 DROPS DAILY AFTER SOAKING AND COVER WITH BANDAID (Patient not taking: Reported on 12/08/2022) 10 mL 0   rosuvastatin (CRESTOR) 5 MG tablet Take 1 tablet (5 mg total) by mouth every Monday, Wednesday, and Friday. (Patient not taking: Reported on 12/08/2022) 90 tablet 1   No current facility-administered medications for this visit.    HISTORY OF PRESENT ILLNESS:   Oncology History Overview Note  Breast cancer of lower-outer quadrant  of right female breast [facility])   Staging form: Breast, AJCC 7th Edition     Clinical stage from 11/15/2014: Stage 0 (Tis (DCIS), N0, M0) - Signed by Malachy Mood, MD on 11/26/2014     Ductal carcinoma in situ (DCIS) of right breast  11/06/2014 Mammogram   Screening mammogram showed calcifications in the lower outer quadrant of the right breast, which was confirmed by diagnostic mammogram   11/15/2014 Initial Biopsy   Breast core needle biopsy showed ductal carcinoma in situ with calcifications.   11/15/2014 Receptors her2   ER+ (100%); PR+ (90%)   11/15/2014 Clinical Stage   Stage 0: Tis N0   12/17/2014 Surgery   Right breast lumpectomy.   12/17/2014 Pathology Results   Right breast lumpectomy showed usual ductal Hyperplasia and fibroadenoma. No residual ductal carcinoma in situ.     Radiation Therapy   Not recommended due to lack of residual malignancy at time of  definitive surgery   03/28/2015 - 08/2017 Anti-estrogen oral therapy   Anastrozole 1 mg daily begun 03/28/2015 and stopped 08/2017 due to poor tolerance.    05/01/2015 Survivorship   Survivorship visit completed and copy of care plan provided to patient   11/06/2015 Imaging   MM DIAG BREAST TOMO BILATERAL 11/06/15 IMPRESSION: No mammographic evidence of malignancy. BI-RADS CATEGORY  2: Benign.   08/04/2016 Pathology Results   Diagnosis  Surgical [P], cecum, polyp - TUBULAR ADENOMA. - NO HIGH GRADE DYSPLASIA OR MALIGNANCY.   08/04/2016 Procedure   Colonoscopy A 2 mm polyp was found in the cecum. The polyp was sessile. The polyp was removed with a cold snare. Resection and retrieval were complete. Findings: - The exam was otherwise without abnormality on direct and retroflexion views.   11/11/2016 Mammogram   IMPRESSION: 1. No mammographic evidence of malignancy. 2. Stable right breast postsurgical changes.   01/05/2022 Mammogram   3D Screening Mammogram bilateral IMPRESSION: No mammographic evidence of malignancy. A  result letter of this screening mammogram will be mailed directly to the patient.  RECOMMENDATION: Screening mammogram in one year. (Code:SM-B-01Y)  BI-RADS CATEGORY  2: Benign.         REVIEW OF SYSTEMS:   Constitutional: Denies fevers, chills or abnormal weight loss Eyes: Denies blurriness of vision Ears, nose, mouth, throat, and face: Denies mucositis or sore throat Respiratory: Denies cough, dyspnea or wheezes Cardiovascular: Denies palpitation, chest discomfort or lower extremity swelling Gastrointestinal:  Denies nausea, heartburn or change in bowel habits Skin: Denies abnormal skin rashes Lymphatics: Denies new lymphadenopathy or easy bruising Neurological:Denies numbness, tingling or new weaknesses Behavioral/Psych: Mood is stable, no new changes  All other systems were reviewed with the patient and are negative.   VITALS:   Today's Vitals   12/08/22 0929  BP: 137/65  Pulse: 90  Resp: 18  Temp: 97.6 F (36.4 C)  TempSrc: Temporal  SpO2: 99%  Weight: 246 lb 12.8 oz (111.9 kg)  Height: 5\' 5"  (1.651 m)   Body mass index is 41.07 kg/m.   Wt Readings from Last 3 Encounters:  12/08/22 246 lb 12.8 oz (111.9 kg)  10/11/22 244 lb 12.8 oz (111 kg)  07/05/22 254 lb 12.8 oz (115.6 kg)    Body mass index is 41.07 kg/m.  Performance status (ECOG): 0 - Asymptomatic  PHYSICAL EXAM:   GENERAL:alert, no distress and comfortable SKIN: skin color, texture, turgor are normal, no rashes or significant lesions EYES: normal, Conjunctiva are pink and non-injected, sclera clear OROPHARYNX:no exudate, no erythema and lips, buccal mucosa, and tongue normal  NECK: supple, thyroid normal size, non-tender, without nodularity LYMPH:  no palpable lymphadenopathy in the cervical, axillary or inguinal LUNGS: clear to auscultation and percussion with normal breathing effort HEART: regular rate & rhythm and no murmurs and no lower extremity edema ABDOMEN:abdomen soft, non-tender and  normal bowel sounds Musculoskeletal:no cyanosis of digits and no clubbing  NEURO: alert & oriented x 3 with fluent speech, no focal motor/sensory deficits Breast Exam: Breasts are symmetrical without nipple discharge or inversion.  Right lumpectomy scar completely healed.  No mass or lump palpable, bilaterally, on today's exam.   LABORATORY DATA:  I have reviewed the data as listed    Component Value Date/Time   NA 139 12/08/2022 0835   NA 140 08/30/2016 0910   K 3.2 (L) 12/08/2022 0835   K 3.4 (L) 08/30/2016 0910   CL 101 12/08/2022 0835   CO2 26 12/08/2022 0835   CO2 29  08/30/2016 0910   GLUCOSE 174 (H) 12/08/2022 0835   GLUCOSE 118 08/30/2016 0910   BUN 16 12/08/2022 0835   BUN 16.1 08/30/2016 0910   CREATININE 0.61 12/08/2022 0835   CREATININE 0.67 11/28/2019 0738   CREATININE 0.7 08/30/2016 0910   CALCIUM 9.6 12/08/2022 0835   CALCIUM 10.0 08/30/2016 0910   PROT 7.7 12/08/2022 0835   PROT 7.9 08/30/2016 0910   ALBUMIN 3.9 12/08/2022 0835   ALBUMIN 3.8 08/30/2016 0910   AST 18 12/08/2022 0835   AST 17 08/30/2016 0910   ALT 17 12/08/2022 0835   ALT 15 08/30/2016 0910   ALKPHOS 68 12/08/2022 0835   ALKPHOS 82 08/30/2016 0910   BILITOT 0.7 12/08/2022 0835   BILITOT 0.35 08/30/2016 0910   GFRNONAA >60 12/08/2022 0835   GFRAA >60 09/01/2017 0851    Lab Results  Component Value Date   WBC 5.8 12/08/2022   NEUTROABS 3.1 12/08/2022   HGB 12.9 12/08/2022   HCT 38.5 12/08/2022   MCV 93.2 12/08/2022   PLT 200 12/08/2022

## 2022-12-07 NOTE — Assessment & Plan Note (Signed)
ER/PR strongly positive -Diagnosed in 10/2014. S/p right breast lumpectomy. Adjuvant radiation not recommended due to lack of residual malignancy at time of definitive surgery -Given her ER/PR positive disease, she started on anastrozole for chemoprevention in 03/2015 but stopped in July 2019 due to worsening arthralgia.  -11/2020 mammogram negative -07/22/2021 - DEXA scan shows normal bone density. -01/05/2022 - benign mammogram -She prefers to continue annual surveillance in our clinic, follow-up in 1 year or sooner if needed

## 2022-12-08 ENCOUNTER — Inpatient Hospital Stay: Payer: Medicare PPO | Attending: Hematology

## 2022-12-08 ENCOUNTER — Inpatient Hospital Stay: Payer: Medicare PPO | Admitting: Nurse Practitioner

## 2022-12-08 VITALS — BP 137/65 | HR 90 | Temp 97.6°F | Resp 18 | Ht 65.0 in | Wt 246.8 lb

## 2022-12-08 DIAGNOSIS — J3089 Other allergic rhinitis: Secondary | ICD-10-CM | POA: Diagnosis not present

## 2022-12-08 DIAGNOSIS — Z170 Estrogen receptor positive status [ER+]: Secondary | ICD-10-CM | POA: Insufficient documentation

## 2022-12-08 DIAGNOSIS — Z79899 Other long term (current) drug therapy: Secondary | ICD-10-CM | POA: Diagnosis not present

## 2022-12-08 DIAGNOSIS — D0511 Intraductal carcinoma in situ of right breast: Secondary | ICD-10-CM | POA: Diagnosis not present

## 2022-12-08 DIAGNOSIS — J301 Allergic rhinitis due to pollen: Secondary | ICD-10-CM | POA: Diagnosis not present

## 2022-12-08 LAB — CBC WITH DIFFERENTIAL (CANCER CENTER ONLY)
Abs Immature Granulocytes: 0.01 10*3/uL (ref 0.00–0.07)
Basophils Absolute: 0 10*3/uL (ref 0.0–0.1)
Basophils Relative: 1 %
Eosinophils Absolute: 0.2 10*3/uL (ref 0.0–0.5)
Eosinophils Relative: 4 %
HCT: 38.5 % (ref 36.0–46.0)
Hemoglobin: 12.9 g/dL (ref 12.0–15.0)
Immature Granulocytes: 0 %
Lymphocytes Relative: 33 %
Lymphs Abs: 1.9 10*3/uL (ref 0.7–4.0)
MCH: 31.2 pg (ref 26.0–34.0)
MCHC: 33.5 g/dL (ref 30.0–36.0)
MCV: 93.2 fL (ref 80.0–100.0)
Monocytes Absolute: 0.5 10*3/uL (ref 0.1–1.0)
Monocytes Relative: 8 %
Neutro Abs: 3.1 10*3/uL (ref 1.7–7.7)
Neutrophils Relative %: 54 %
Platelet Count: 200 10*3/uL (ref 150–400)
RBC: 4.13 MIL/uL (ref 3.87–5.11)
RDW: 13 % (ref 11.5–15.5)
WBC Count: 5.8 10*3/uL (ref 4.0–10.5)
nRBC: 0 % (ref 0.0–0.2)

## 2022-12-08 LAB — CMP (CANCER CENTER ONLY)
ALT: 17 U/L (ref 0–44)
AST: 18 U/L (ref 15–41)
Albumin: 3.9 g/dL (ref 3.5–5.0)
Alkaline Phosphatase: 68 U/L (ref 38–126)
Anion gap: 12 (ref 5–15)
BUN: 16 mg/dL (ref 8–23)
CO2: 26 mmol/L (ref 22–32)
Calcium: 9.6 mg/dL (ref 8.9–10.3)
Chloride: 101 mmol/L (ref 98–111)
Creatinine: 0.61 mg/dL (ref 0.44–1.00)
GFR, Estimated: >60 mL/min (ref >60)
Glucose, Bld: 174 mg/dL — ABNORMAL HIGH (ref 70–99)
Potassium: 3.2 mmol/L — ABNORMAL LOW (ref 3.5–5.1)
Sodium: 139 mmol/L (ref 135–145)
Total Bilirubin: 0.7 mg/dL (ref 0.3–1.2)
Total Protein: 7.7 g/dL (ref 6.5–8.1)

## 2022-12-08 LAB — VITAMIN D 25 HYDROXY (VIT D DEFICIENCY, FRACTURES): Vit D, 25-Hydroxy: 46.84 ng/mL (ref 30–100)

## 2022-12-16 DIAGNOSIS — J3081 Allergic rhinitis due to animal (cat) (dog) hair and dander: Secondary | ICD-10-CM | POA: Diagnosis not present

## 2022-12-16 DIAGNOSIS — J301 Allergic rhinitis due to pollen: Secondary | ICD-10-CM | POA: Diagnosis not present

## 2022-12-16 DIAGNOSIS — J3089 Other allergic rhinitis: Secondary | ICD-10-CM | POA: Diagnosis not present

## 2022-12-22 ENCOUNTER — Encounter: Payer: Self-pay | Admitting: Podiatry

## 2022-12-22 ENCOUNTER — Ambulatory Visit (INDEPENDENT_AMBULATORY_CARE_PROVIDER_SITE_OTHER): Payer: Medicare PPO | Admitting: Podiatry

## 2022-12-22 DIAGNOSIS — M79674 Pain in right toe(s): Secondary | ICD-10-CM

## 2022-12-22 DIAGNOSIS — E119 Type 2 diabetes mellitus without complications: Secondary | ICD-10-CM | POA: Diagnosis not present

## 2022-12-22 DIAGNOSIS — M79675 Pain in left toe(s): Secondary | ICD-10-CM

## 2022-12-22 DIAGNOSIS — B351 Tinea unguium: Secondary | ICD-10-CM

## 2022-12-22 DIAGNOSIS — I10 Essential (primary) hypertension: Secondary | ICD-10-CM | POA: Diagnosis not present

## 2022-12-22 NOTE — Progress Notes (Signed)
Subjective:  Patient ID: Darlene Mclaughlin, female    DOB: Jun 26, 1950,  MRN: [id number]  Darlene Mclaughlin presents to clinic today for: preventative diabetic foot care and painful thick toenails that are difficult to trim. Pain interferes with ambulation. Aggravating factors include wearing enclosed shoe gear. Pain is relieved with periodic professional debridement.  Chief Complaint  Patient presents with   Diabetes    A1C BS-140     PCP is Philip Aspen, Limmie Patricia, MD.  Allergies  Allergen Reactions   Atorvastatin     Hallucinations    Hydrochlorothiazide     Muscle pain   Hydrocodone     Swollen tongue   Macrobid [Nitrofurantoin Monohyd Macro]      Liver problems   Naproxen Swelling    Mouth and tongue swelling   Simvastatin     Hallucinations, muscle pains   Zanaflex [Tizanidine Hcl] Other (See Comments)     Liver problems    Review of Systems: Negative except as noted in the HPI.  Objective: No changes noted in today's physical examination. There were no vitals filed for this visit.  Darlene Mclaughlin is a pleasant 72 y.o. female in NAD. AAO x 3.  Vascular Examination: Capillary refill time <3 seconds b/l LE. Palpable pedal pulses b/l LE. Digital hair present b/l. No pedal edema b/l. Skin temperature gradient WNL b/l. No varicosities b/l. Marland Kitchen  Dermatological Examination: Pedal skin with normal turgor, texture and tone b/l. No open wounds. No interdigital macerations b/l. Toenails 1-5 b/l thickened, discolored, dystrophic with subungual debris. There is pain on palpation to dorsal aspect of nailplates. No corns, calluses nor porokeratotic lesions noted..  Neurological Examination: Protective sensation intact with 10 gram monofilament b/l LE. Vibratory sensation intact b/l LE.   Musculoskeletal Examination: Muscle strength 5/5 to all lower extremity muscle groups bilaterally. HAV with bunion bilaterally and hammertoes 2-5 b/l. Pes planus deformity noted bilateral  LE.     Latest Ref Rng & Units 10/11/2022   10:01 AM 07/05/2022   10:23 AM 04/06/2022    8:44 AM  Hemoglobin A1C  Hemoglobin-A1c 4.0 - 5.6 % 7.7  8.2  8.1    Assessment/Plan: 1. Pain due to onychomycosis of toenails of both feet   2. Diabetes mellitus with coincident hypertension [facility])    Patient was evaluated and treated. All patient's and/or POA's questions/concerns addressed on today's visit. Mycotic toenails 1-5 debrided in length and girth without incident. Continue soft, supportive shoe gear daily. Report any pedal injuries to medical professional. Call office if there are any quesitons/concerns. -Patient/POA to call should there be question/concern in the interim.   Return in about 3 months (around 03/24/2023).  Freddie Breech, DPM

## 2022-12-23 DIAGNOSIS — J301 Allergic rhinitis due to pollen: Secondary | ICD-10-CM | POA: Diagnosis not present

## 2022-12-23 DIAGNOSIS — J3089 Other allergic rhinitis: Secondary | ICD-10-CM | POA: Diagnosis not present

## 2022-12-31 DIAGNOSIS — J3089 Other allergic rhinitis: Secondary | ICD-10-CM | POA: Diagnosis not present

## 2022-12-31 DIAGNOSIS — J301 Allergic rhinitis due to pollen: Secondary | ICD-10-CM | POA: Diagnosis not present

## 2022-12-31 DIAGNOSIS — J3081 Allergic rhinitis due to animal (cat) (dog) hair and dander: Secondary | ICD-10-CM | POA: Diagnosis not present

## 2023-01-05 DIAGNOSIS — J3089 Other allergic rhinitis: Secondary | ICD-10-CM | POA: Diagnosis not present

## 2023-01-05 DIAGNOSIS — J3081 Allergic rhinitis due to animal (cat) (dog) hair and dander: Secondary | ICD-10-CM | POA: Diagnosis not present

## 2023-01-05 DIAGNOSIS — J301 Allergic rhinitis due to pollen: Secondary | ICD-10-CM | POA: Diagnosis not present

## 2023-01-07 ENCOUNTER — Ambulatory Visit
Admission: RE | Admit: 2023-01-07 | Discharge: 2023-01-07 | Disposition: A | Discharge status: Home / Self Care | Payer: Medicare PPO | Source: Ambulatory Visit | Attending: Nurse Practitioner | Admitting: Nurse Practitioner

## 2023-01-07 DIAGNOSIS — D0511 Intraductal carcinoma in situ of right breast: Secondary | ICD-10-CM

## 2023-01-07 DIAGNOSIS — Z1231 Encounter for screening mammogram for malignant neoplasm of breast: Secondary | ICD-10-CM | POA: Diagnosis not present

## 2023-01-10 IMAGING — MR MR LUMBAR SPINE W/O CM
4 of 5 series · 17 of 48 positions shown · non-contrast
Comparison: Lumbar MRI 07/19/2014.  CT Abdomen 07/17/2014.

CLINICAL DATA: 69-year-old female with right side low back pain
radiating down the leg for 6 months.

EXAM:
MRI LUMBAR SPINE WITHOUT CONTRAST
TECHNIQUE: Multiplanar, multisequence MR imaging of the lumbar spine was
performed. No intravenous contrast was administered.

[Series 5: T2 · sagittal · 4.0mm · 0.73mm/px · 6 of 15 slices shown (1 of 2)]
[im 1/15]
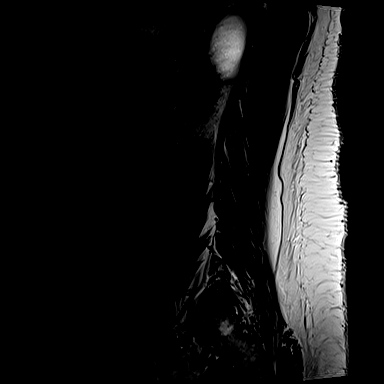
[im 3/15]
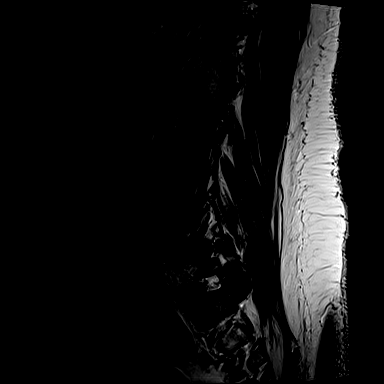
[im 6/15]
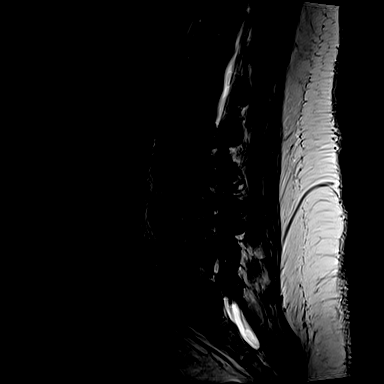
[im 9/15]
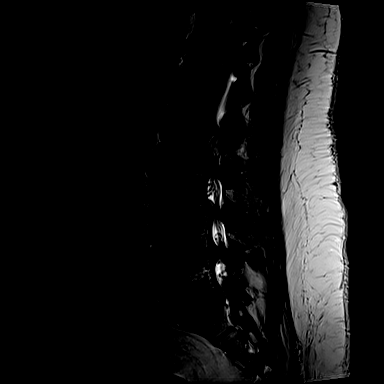
[im 12/15]
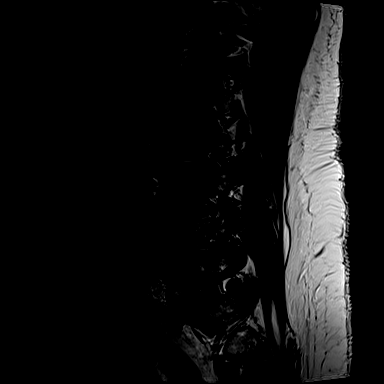
[im 15/15]
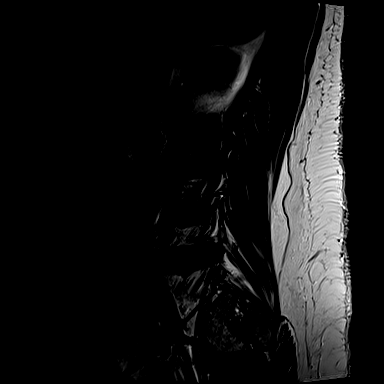

[Series 10: T1 · axial · 4.0mm · 0.28mm/px · z∈[-98,+51]mm · 3 of 37 slices shown (1 of 2)]
[im 6/37]
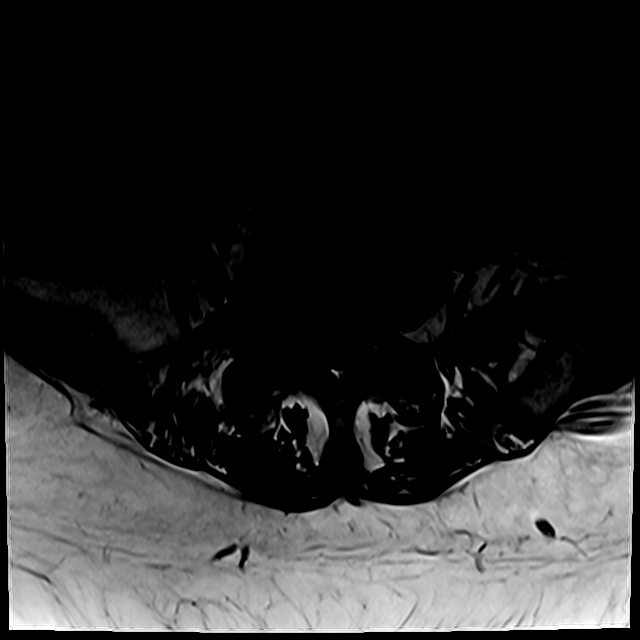
[im 19/37]
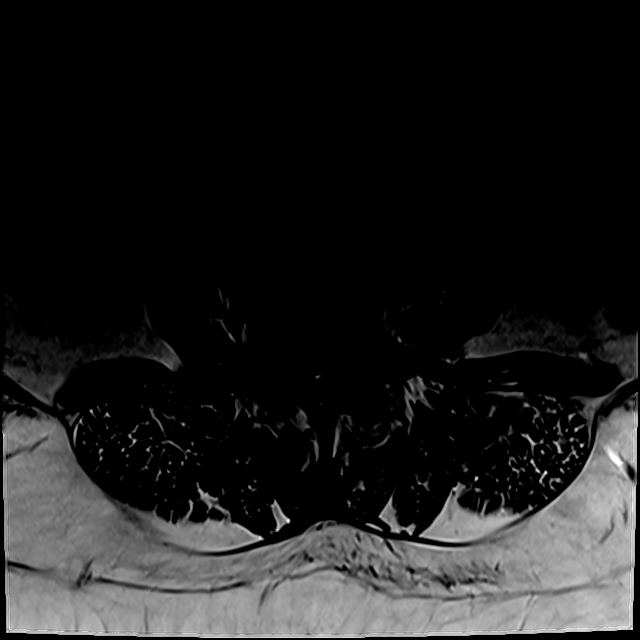
[im 31/37]
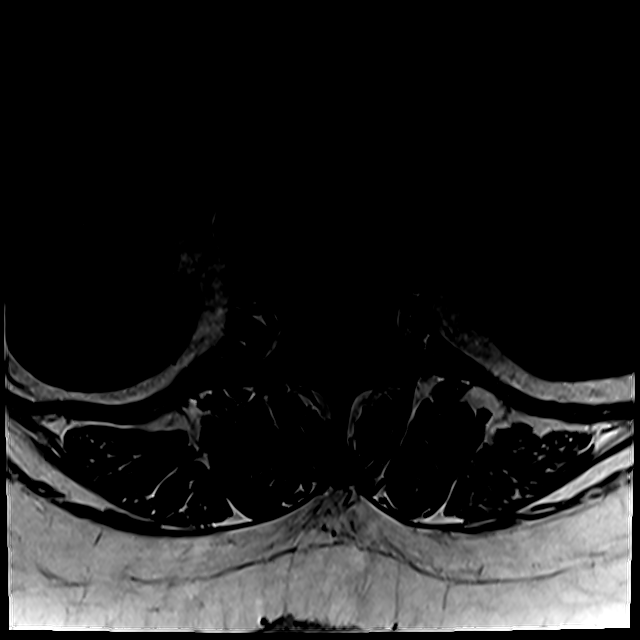

[Series 13: T2 · axial · 4.0mm · 0.28mm/px · z∈[-123,+51]mm · 5 of 37 slices shown (2 of 2)]
[im 1/37]
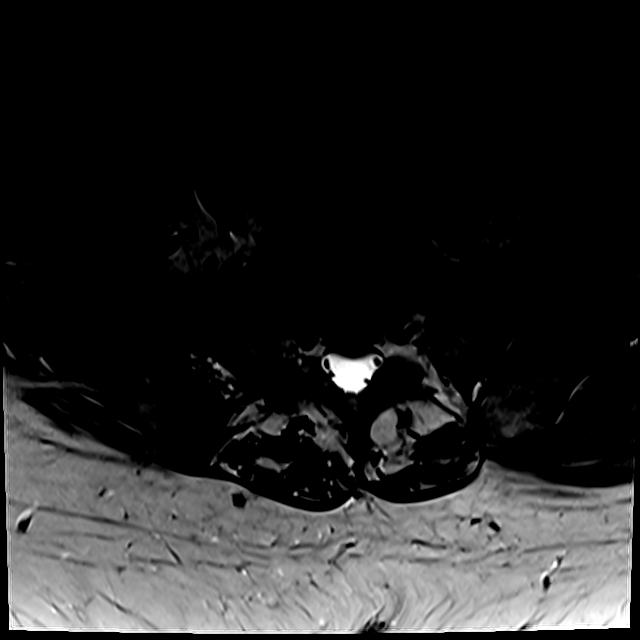
[im 6/37]
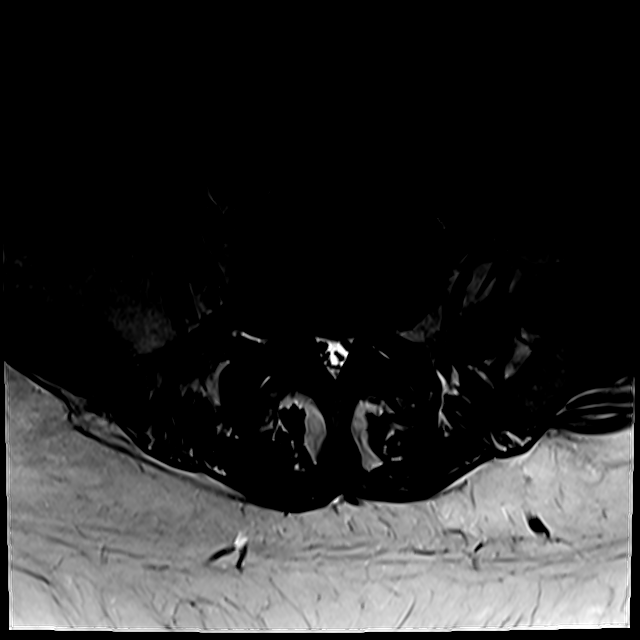
[im 11/37]
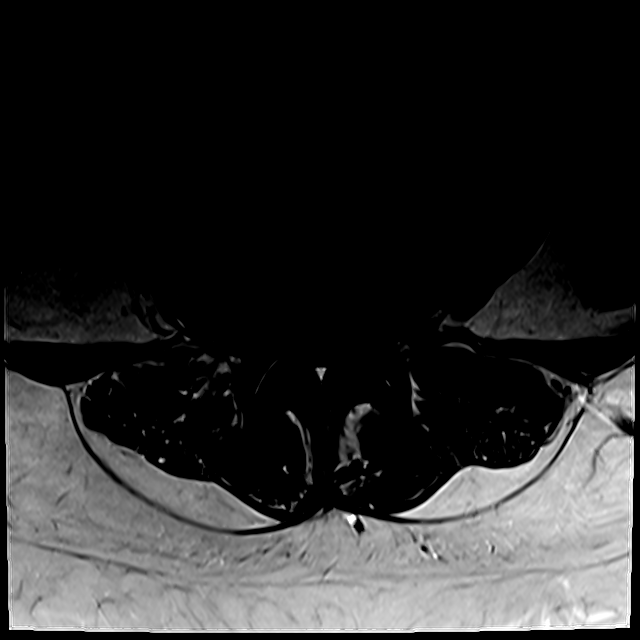
[im 19/37]
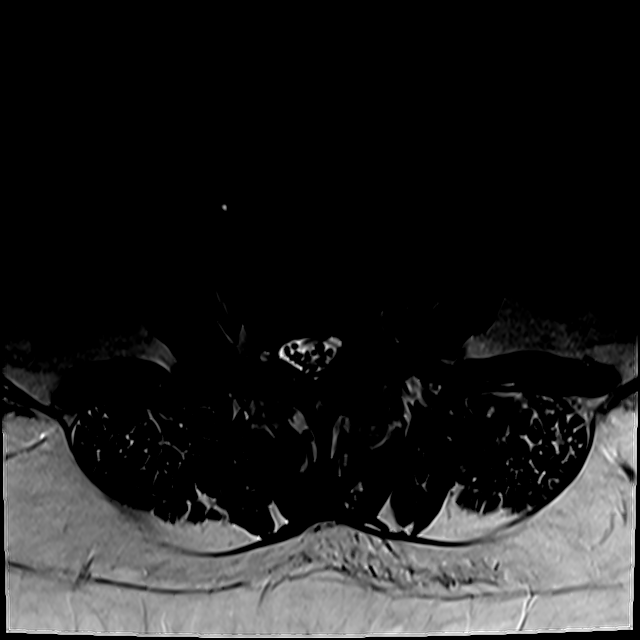
[im 31/37]
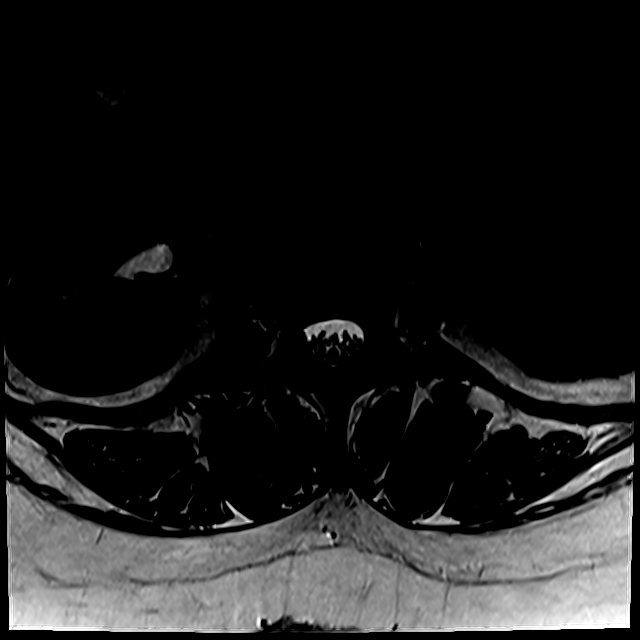

[Series 14: T1 · sagittal · 4.0mm · 0.73mm/px · 3 of 15 slices shown (2 of 2)]
[im 3/15]
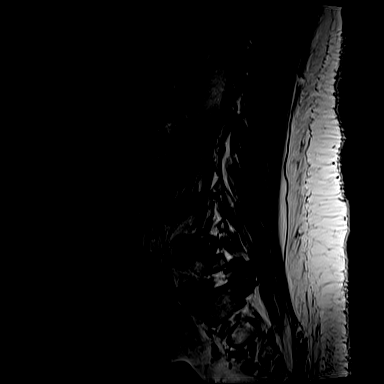
[im 9/15]
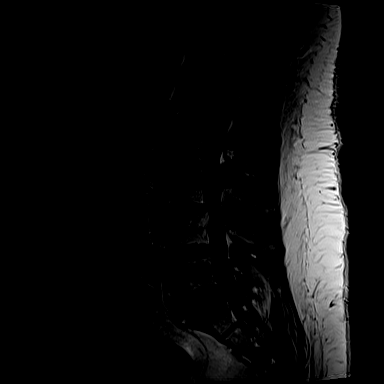
[im 15/15]
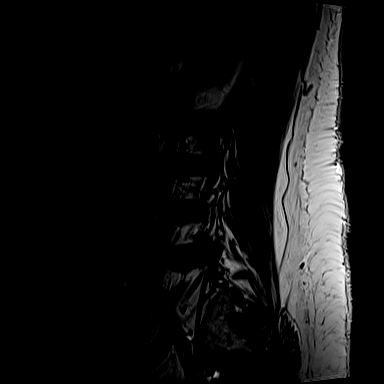

[17 of 48 positions shown; findings below may reference images not displayed]

FINDINGS: Segmentation: Normal on the comparison CT which is the same
numbering system used on the 6866 MRI.

Alignment: Progressed degenerative spondylolisthesis since 6866:
Increased, now 4 mm anterolisthesis of L2 on L3, and trace
anterolisthesis of L3 on L4. Mild superimposed mostly levoconvex
lumbar spine scoliosis.

Vertebrae: Faint degenerative endplate marrow edema at L3-L4
eccentric to the left. No other marrow edema or evidence of acute
osseous abnormality. Benign vertebral hemangiomas again noted at L3
and L5. Background bone marrow signal remains normal. Intact visible
sacrum and SI joints.

Conus medullaris and cauda equina: Conus extends to the T12-L1
level. No lower spinal cord or conus signal abnormality.

Paraspinal and other soft tissues: Chronic benign left renal cysts.
Negative visible other abdominal viscera. Negative lumbar paraspinal
soft tissues.

Disc levels:

Fairly mild for age degeneration and no significant stenosis from
the visible lower thoracic spine through T12-L1.

L1-L2: New multifactorial mild spinal stenosis since 6866 related to
interval disc desiccation, circumferential disc bulge and mild to
moderate posterior element hypertrophy. Mild new L1 foraminal
stenosis.

L2-L3: Progressed spondylolisthesis and disc space loss since 6866.
Increased and severe multifactorial spinal stenosis (series 13,
image 15) with severe bilateral lateral recess stenosis, moderate to
severe left L2 foraminal stenosis.

L3-L4: Increased moderate to severe spinal and left lateral recess
stenosis since 6866 from progressive circumferential disc bulge and
moderate to severe posterior element hypertrophy. Bulky left far
lateral component of disc. Severe left L3 foraminal stenosis is
increased. Mild right foraminal stenosis is stable.

L4-L5: Chronic severe spinal and lateral recess stenosis related to
bulky circumferential disc bulge and posterior element hypertrophy
appears stable. However, severe left L4 neural foraminal stenosis
appears increased related to foraminal disc (series 5, image 11).
Likewise, and there is very severe right L4 neural foraminal
stenosis also due to bulky foraminal disc which is significantly
increased from 6866 (series 5, image 3).

L5-S1: Circumferential disc bulge with endplate spurring. Moderate
facet hypertrophy. Mild spinal and mild to moderate bilateral
lateral recess stenosis has increased. Mild to moderate bilateral L5
foraminal stenosis has increased.
IMPRESSION: 1. Symptomatic level with regard to right side radiating pain is
favored to be L4-L5, where bulky increased right foraminal disc
herniation since 6866 now results in severe right foraminal
stenosis. Query Right L4 radiculitis.

2. But superimposed multilevel moderate and severe spinal stenosis
and neural impingement otherwise:
- progressed multifactorial severe spinal and lateral recess
stenosis in the setting of increasing spondylolisthesis at L2-L3 >
L3-L4.
- moderate to severe increased neural foraminal stenosis also at the
left L2, left L3 nerve levels.
- mild new spinal stenosis at L1-L2 and L5-S1. And mild to moderate
lateral recess and foraminal stenosis at the latter.

## 2023-01-11 ENCOUNTER — Ambulatory Visit: Payer: Medicare PPO | Admitting: Internal Medicine

## 2023-01-11 ENCOUNTER — Encounter: Payer: Self-pay | Admitting: Internal Medicine

## 2023-01-11 ENCOUNTER — Encounter: Payer: Self-pay | Admitting: Nurse Practitioner

## 2023-01-11 VITALS — BP 110/70 | HR 110 | Temp 98.5°F | Wt 241.3 lb

## 2023-01-11 DIAGNOSIS — J3089 Other allergic rhinitis: Secondary | ICD-10-CM | POA: Diagnosis not present

## 2023-01-11 DIAGNOSIS — E119 Type 2 diabetes mellitus without complications: Secondary | ICD-10-CM

## 2023-01-11 DIAGNOSIS — E785 Hyperlipidemia, unspecified: Secondary | ICD-10-CM | POA: Diagnosis not present

## 2023-01-11 DIAGNOSIS — I10 Essential (primary) hypertension: Secondary | ICD-10-CM | POA: Diagnosis not present

## 2023-01-11 DIAGNOSIS — J301 Allergic rhinitis due to pollen: Secondary | ICD-10-CM | POA: Diagnosis not present

## 2023-01-11 DIAGNOSIS — J3081 Allergic rhinitis due to animal (cat) (dog) hair and dander: Secondary | ICD-10-CM | POA: Diagnosis not present

## 2023-01-11 LAB — POCT GLYCOSYLATED HEMOGLOBIN (HGB A1C): Hemoglobin A1C: 7.4 % — AB (ref 4.0–5.6)

## 2023-01-11 NOTE — Progress Notes (Signed)
Established Patient Office Visit     CC/Reason for Visit: Follow-up chronic conditions  HPI: Darlene Mclaughlin is a 72 y.o. female who is coming in today for the above mentioned reasons. Past Medical History is significant for: Hypertension, hyperlipidemia, type 2 diabetes, morbid obesity, lumbar spinal stenosis.  She is doing well.  No acute concerns or complaints.  Never started the Ozempic.  Has been working on lifestyle changes.  Has lost 6 pounds since last visit.   Past Medical/Surgical History: Past Medical History:  Diagnosis Date   ALLERGIC RHINITIS 05/15/2008   Allergy    Anemia    ASTHMA 05/15/2008   Asthma    Blood transfusion without reported diagnosis 1991   anemia   Breast cancer (HCC) 2016   right breast   Cancer (HCC)    DCIS right breast   Diabetes mellitus without complication (HCC)    GERD (gastroesophageal reflux disease)    Headache(784.0) 05/15/2008   Hyperlipidemia    HYPERTENSION 05/15/2008   IMPAIRED GLUCOSE TOLERANCE 05/15/2008   Obesity    OSTEOARTHRITIS 05/15/2008   back    Past Surgical History:  Procedure Laterality Date   ABDOMINAL HYSTERECTOMY     ANTERIOR LAT LUMBAR FUSION Left 05/21/2020   Procedure: LUMBAR TWO-THREE, LUMBAR THREE-FOUR DIRECT LATERAL LUMBAR INTERBODY FUSION;  Surgeon: Bedelia Person, MD;  Location: [facility] OR;  Service: Neurosurgery;  Laterality: Left;   APPLICATION OF ROBOTIC ASSISTANCE FOR SPINAL PROCEDURE N/A 05/21/2020   Procedure: APPLICATION OF ROBOTIC ASSISTANCE FOR SPINAL PROCEDURE;  Surgeon: Bedelia Person, MD;  Location: [facility] OR;  Service: Neurosurgery;  Laterality: N/A;   BREAST LUMPECTOMY Right 12/17/2014   BREAST LUMPECTOMY WITH RADIOACTIVE SEED LOCALIZATION Right 12/17/2014   Procedure: RIGHT BREAST RADIOACTIVE SEED GUIDED PARTIAL MASTECTOMY;  Surgeon: Harriette Bouillon, MD;  Location: [facility];  Service: General;  Laterality: Right;   CESAREAN SECTION     x2   COLONOSCOPY     KNEE  SURGERY     arthroscopic left   TRANSFORAMINAL LUMBAR INTERBODY FUSION W/ MIS 2 LEVEL Right 05/21/2020   Procedure: LUMBAR FOUR-FIVE, LUMBAR FIVE-SACRAL ONE MINIMALLY INVASIVE TRANSFORAMINAL LUMBAR INTERBODY FUSION WITH INSTRUMENTATION LUMBAR TWO-SACRAL ONE;  Surgeon: Bedelia Person, MD;  Location: [facility] OR;  Service: Neurosurgery;  Laterality: Right;    Social History:  reports that she has never smoked. She has never used smokeless tobacco. She reports that she does not currently use alcohol. She reports that she does not use drugs.  Allergies: Allergies  Allergen Reactions   Atorvastatin     Hallucinations    Hydrochlorothiazide     Muscle pain   Hydrocodone     Swollen tongue   Macrobid [Nitrofurantoin Monohyd Macro]      Liver problems   Naproxen Swelling    Mouth and tongue swelling   Simvastatin     Hallucinations, muscle pains   Zanaflex [Tizanidine Hcl] Other (See Comments)     Liver problems    Family History:  Family History  Problem Relation Age of Onset   Diabetes Mother    Hypertension Mother    Breast cancer Mother 8       Breast cancer twice    Hyperlipidemia Father    Colon cancer Brother        4 th brother   Diabetes Brother    Pancreatic cancer Brother        middle brother   Stomach cancer Neg Hx    Esophageal  cancer Neg Hx    Rectal cancer Neg Hx      Current Outpatient Medications:    Accu-Chek FastClix Lancets MISC, 1 EACH BY DOES NOT APPLY ROUTE DAILY. USE FOR ACCU-CHEK GUIDE, Disp: 102 each, Rfl: 3   ACCU-CHEK GUIDE test strip, USE AS DIRECTED, Disp: 100 strip, Rfl: 3   acetaminophen (TYLENOL) 650 MG CR tablet, Take 650 mg by mouth every 8 (eight) hours as needed for pain., Disp: , Rfl:    aspirin 81 MG tablet, Take 81 mg by mouth daily., Disp: , Rfl:    azelastine (OPTIVAR) 0.05 % ophthalmic solution, Place 1 drop into both eyes daily as needed (allergies)., Disp: , Rfl:    Blood Glucose Monitoring Suppl (ACCU-CHEK GUIDE ME) w/Device  KIT, USE TO CHECK BLOOD SUGAR ONCE DAILY, Disp: 1 kit, Rfl: 1   CALCIUM PO, Take 1,000 mg by mouth daily., Disp: , Rfl:    cetirizine (ZYRTEC) 10 MG tablet, Take 10 mg by mouth daily as needed for allergies., Disp: , Rfl:    chlorthalidone (HYGROTON) 25 MG tablet, TAKE 1 TABLET BY MOUTH EVERY DAY, Disp: 90 tablet, Rfl: 1   cyclobenzaprine (FLEXERIL) 10 MG tablet, Take 1 tablet (10 mg total) by mouth 3 (three) times daily as needed for muscle spasms., Disp: 60 tablet, Rfl: 0   Docusate Sodium (DSS) 100 MG CAPS, Take 1 capsule by mouth 2 (two) times daily., Disp: , Rfl:    EPIPEN 2-PAK 0.3 MG/0.3ML SOAJ injection, Inject 0.3 mg into the muscle as needed for anaphylaxis., Disp: , Rfl: 1   fluticasone (FLONASE) 50 MCG/ACT nasal spray, Place 1 spray into both nostrils daily. (Patient taking differently: Place 1 spray into both nostrils daily as needed for allergies.), Disp: 16 g, Rfl: 5   KLOR-CON M20 20 MEQ tablet, TAKE 1 TABLET BY MOUTH TWICE A DAY, Disp: 180 tablet, Rfl: 1   Melatonin 10 MG TABS, Take 10 mg by mouth at bedtime as needed (sleep)., Disp: , Rfl:    metFORMIN (GLUCOPHAGE-XR) 500 MG 24 hr tablet, TAKE 2 TABLETS (1,000 MG TOTAL) BY MOUTH DAILY WITH SUPPER, Disp: 180 tablet, Rfl: 1   Multiple Vitamin (MULTIVITAMIN) tablet, Take 1 tablet by mouth daily., Disp: , Rfl:    neomycin-polymyxin-hydrocortisone (CORTISPORIN) 3.5-10000-1 OTIC suspension, APPLY 1-2 DROPS DAILY AFTER SOAKING AND COVER WITH BANDAID, Disp: 10 mL, Rfl: 0   NON FORMULARY, EVERY OTHER WEEK ALLERGY INJECTIONS, Disp: , Rfl:    PROAIR HFA 108 (90 BASE) MCG/ACT inhaler, Inhale 1 puff into the lungs every 6 (six) hours as needed for shortness of breath or wheezing., Disp: , Rfl: 0   rosuvastatin (CRESTOR) 5 MG tablet, Take 1 tablet (5 mg total) by mouth every Monday, Wednesday, and Friday., Disp: 90 tablet, Rfl: 1   Simethicone (GAS-X PO), Take 1 tablet by mouth daily as needed (gas)., Disp: , Rfl:    traMADol (ULTRAM) 50 MG  tablet, Take 1 tablet by mouth every 6 (six) hours as needed., Disp: , Rfl:   Review of Systems:  Negative unless indicated in HPI.   Physical Exam: Vitals:   01/11/23 1306  BP: 110/70  Pulse: (!) 110  Temp: 98.5 F (36.9 C)  TempSrc: Oral  SpO2: 98%  Weight: 241 lb 4.8 oz (109.5 kg)    Body mass index is 40.15 kg/m.   Physical Exam Vitals reviewed.  Constitutional:      Appearance: Normal appearance. She is obese.  HENT:     Head: Normocephalic and atraumatic.  Eyes:     Conjunctiva/sclera: Conjunctivae normal.     Pupils: Pupils are equal, round, and reactive to light.  Cardiovascular:     Rate and Rhythm: Normal rate and regular rhythm.     Heart sounds: Murmur heard.  Pulmonary:     Effort: Pulmonary effort is normal.     Breath sounds: Normal breath sounds.  Skin:    General: Skin is warm and dry.  Neurological:     General: No focal deficit present.     Mental Status: She is alert and oriented to person, place, and time.  Psychiatric:        Mood and Affect: Mood normal.        Behavior: Behavior normal.        Thought Content: Thought content normal.        Judgment: Judgment normal.      Impression and Plan:  Diabetes mellitus with coincident hypertension (HCC) -     POCT glycosylated hemoglobin (Hb A1C)  Morbid obesity (HCC)  Essential hypertension  Dyslipidemia   -Blood pressure is well-controlled. -A1c has improved from 7.7 in August down to 7.4 today.  She prefers to continue to work on lifestyle changes and is very against the idea of a GLP-1. -Check cholesterol next visit.  She is on rosuvastatin. -Discussed healthy lifestyle, including increased physical activity and better food choices to promote weight loss.  She is doing pool aerobics at the [facility].   Time spent:31 minutes reviewing chart, interviewing and examining patient and formulating plan of care.     Chaya Jan, MD [facility]

## 2023-01-17 ENCOUNTER — Other Ambulatory Visit: Payer: Self-pay | Admitting: Internal Medicine

## 2023-01-17 DIAGNOSIS — J3081 Allergic rhinitis due to animal (cat) (dog) hair and dander: Secondary | ICD-10-CM | POA: Diagnosis not present

## 2023-01-17 DIAGNOSIS — J3089 Other allergic rhinitis: Secondary | ICD-10-CM | POA: Diagnosis not present

## 2023-01-17 DIAGNOSIS — J301 Allergic rhinitis due to pollen: Secondary | ICD-10-CM | POA: Diagnosis not present

## 2023-01-27 DIAGNOSIS — J301 Allergic rhinitis due to pollen: Secondary | ICD-10-CM | POA: Diagnosis not present

## 2023-01-27 DIAGNOSIS — J3089 Other allergic rhinitis: Secondary | ICD-10-CM | POA: Diagnosis not present

## 2023-02-03 DIAGNOSIS — J3089 Other allergic rhinitis: Secondary | ICD-10-CM | POA: Diagnosis not present

## 2023-02-03 DIAGNOSIS — J301 Allergic rhinitis due to pollen: Secondary | ICD-10-CM | POA: Diagnosis not present

## 2023-02-04 DIAGNOSIS — E119 Type 2 diabetes mellitus without complications: Secondary | ICD-10-CM | POA: Diagnosis not present

## 2023-02-04 LAB — HM DIABETES EYE EXAM

## 2023-02-09 DIAGNOSIS — J301 Allergic rhinitis due to pollen: Secondary | ICD-10-CM | POA: Diagnosis not present

## 2023-02-09 DIAGNOSIS — J3089 Other allergic rhinitis: Secondary | ICD-10-CM | POA: Diagnosis not present

## 2023-02-09 DIAGNOSIS — J3081 Allergic rhinitis due to animal (cat) (dog) hair and dander: Secondary | ICD-10-CM | POA: Diagnosis not present

## 2023-02-18 DIAGNOSIS — J301 Allergic rhinitis due to pollen: Secondary | ICD-10-CM | POA: Diagnosis not present

## 2023-02-18 DIAGNOSIS — J3089 Other allergic rhinitis: Secondary | ICD-10-CM | POA: Diagnosis not present

## 2023-02-25 DIAGNOSIS — J301 Allergic rhinitis due to pollen: Secondary | ICD-10-CM | POA: Diagnosis not present

## 2023-02-25 DIAGNOSIS — J3089 Other allergic rhinitis: Secondary | ICD-10-CM | POA: Diagnosis not present

## 2023-02-25 DIAGNOSIS — J3081 Allergic rhinitis due to animal (cat) (dog) hair and dander: Secondary | ICD-10-CM | POA: Diagnosis not present

## 2023-03-03 DIAGNOSIS — J3089 Other allergic rhinitis: Secondary | ICD-10-CM | POA: Diagnosis not present

## 2023-03-03 DIAGNOSIS — J301 Allergic rhinitis due to pollen: Secondary | ICD-10-CM | POA: Diagnosis not present

## 2023-03-11 DIAGNOSIS — J301 Allergic rhinitis due to pollen: Secondary | ICD-10-CM | POA: Diagnosis not present

## 2023-03-11 DIAGNOSIS — J3089 Other allergic rhinitis: Secondary | ICD-10-CM | POA: Diagnosis not present

## 2023-03-11 DIAGNOSIS — J3081 Allergic rhinitis due to animal (cat) (dog) hair and dander: Secondary | ICD-10-CM | POA: Diagnosis not present

## 2023-03-17 DIAGNOSIS — J301 Allergic rhinitis due to pollen: Secondary | ICD-10-CM | POA: Diagnosis not present

## 2023-03-17 DIAGNOSIS — J3081 Allergic rhinitis due to animal (cat) (dog) hair and dander: Secondary | ICD-10-CM | POA: Diagnosis not present

## 2023-03-17 DIAGNOSIS — J3089 Other allergic rhinitis: Secondary | ICD-10-CM | POA: Diagnosis not present

## 2023-03-23 DIAGNOSIS — J3081 Allergic rhinitis due to animal (cat) (dog) hair and dander: Secondary | ICD-10-CM | POA: Diagnosis not present

## 2023-03-23 DIAGNOSIS — J301 Allergic rhinitis due to pollen: Secondary | ICD-10-CM | POA: Diagnosis not present

## 2023-03-23 DIAGNOSIS — J3089 Other allergic rhinitis: Secondary | ICD-10-CM | POA: Diagnosis not present

## 2023-04-01 DIAGNOSIS — J3089 Other allergic rhinitis: Secondary | ICD-10-CM | POA: Diagnosis not present

## 2023-04-01 DIAGNOSIS — J301 Allergic rhinitis due to pollen: Secondary | ICD-10-CM | POA: Diagnosis not present

## 2023-04-01 DIAGNOSIS — J3081 Allergic rhinitis due to animal (cat) (dog) hair and dander: Secondary | ICD-10-CM | POA: Diagnosis not present

## 2023-04-02 ENCOUNTER — Other Ambulatory Visit: Payer: Self-pay | Admitting: Podiatry

## 2023-04-06 ENCOUNTER — Encounter: Payer: Self-pay | Admitting: Podiatry

## 2023-04-06 ENCOUNTER — Ambulatory Visit (INDEPENDENT_AMBULATORY_CARE_PROVIDER_SITE_OTHER): Payer: Medicare PPO | Admitting: Podiatry

## 2023-04-06 VITALS — Ht 65.0 in | Wt 241.0 lb

## 2023-04-06 DIAGNOSIS — M2012 Hallux valgus (acquired), left foot: Secondary | ICD-10-CM | POA: Diagnosis not present

## 2023-04-06 DIAGNOSIS — M2042 Other hammer toe(s) (acquired), left foot: Secondary | ICD-10-CM

## 2023-04-06 DIAGNOSIS — B351 Tinea unguium: Secondary | ICD-10-CM | POA: Diagnosis not present

## 2023-04-06 DIAGNOSIS — M79674 Pain in right toe(s): Secondary | ICD-10-CM

## 2023-04-06 DIAGNOSIS — I739 Peripheral vascular disease, unspecified: Secondary | ICD-10-CM | POA: Diagnosis not present

## 2023-04-06 DIAGNOSIS — M79675 Pain in left toe(s): Secondary | ICD-10-CM

## 2023-04-06 DIAGNOSIS — M2011 Hallux valgus (acquired), right foot: Secondary | ICD-10-CM | POA: Diagnosis not present

## 2023-04-06 DIAGNOSIS — E119 Type 2 diabetes mellitus without complications: Secondary | ICD-10-CM

## 2023-04-06 DIAGNOSIS — M2041 Other hammer toe(s) (acquired), right foot: Secondary | ICD-10-CM | POA: Diagnosis not present

## 2023-04-06 DIAGNOSIS — E1151 Type 2 diabetes mellitus with diabetic peripheral angiopathy without gangrene: Secondary | ICD-10-CM

## 2023-04-08 ENCOUNTER — Ambulatory Visit: Payer: Medicare PPO

## 2023-04-08 ENCOUNTER — Telehealth: Payer: Self-pay | Admitting: Internal Medicine

## 2023-04-08 VITALS — BP 122/64 | HR 98 | Temp 98.0°F | Ht 65.0 in | Wt 240.6 lb

## 2023-04-08 DIAGNOSIS — J3081 Allergic rhinitis due to animal (cat) (dog) hair and dander: Secondary | ICD-10-CM | POA: Diagnosis not present

## 2023-04-08 DIAGNOSIS — J3089 Other allergic rhinitis: Secondary | ICD-10-CM | POA: Diagnosis not present

## 2023-04-08 DIAGNOSIS — Z0000 Encounter for general adult medical examination without abnormal findings: Secondary | ICD-10-CM

## 2023-04-08 DIAGNOSIS — J301 Allergic rhinitis due to pollen: Secondary | ICD-10-CM | POA: Diagnosis not present

## 2023-04-08 NOTE — Patient Instructions (Addendum)
 Darlene Mclaughlin , Thank you for taking time to come for your Medicare Wellness Visit. I appreciate your ongoing commitment to your health goals. Please review the following plan we discussed and let me know if I can assist you in the future.   Referrals/Orders/Follow-Ups/Clinician Recommendations:   This is a list of the screening recommended for you and due dates:  Health Maintenance  Topic Date Due   Zoster (Shingles) Vaccine (1 of 2) Never done   COVID-19 Vaccine (7 - 2024-25 season) 10/24/2022   Complete foot exam   01/26/2023   DTaP/Tdap/Td vaccine (2 - Td or Tdap) 06/05/2023   Yearly kidney health urinalysis for diabetes  07/08/2023   Hemoglobin A1C  07/11/2023   Yearly kidney function blood test for diabetes  12/08/2023   Eye exam for diabetics  12/24/2023   Mammogram  01/07/2024   Medicare Annual Wellness Visit  04/07/2024   Colon Cancer Screening  11/07/2026   Pneumonia Vaccine  Completed   Flu Shot  Completed   DEXA scan (bone density measurement)  Completed   Hepatitis C Screening  Completed   HPV Vaccine  Aged Out   Opioid Pain Medicine Management Opioids are powerful medicines that are used to treat moderate to severe pain. When used for short periods of time, they can help you to: Sleep better. Do better in physical or occupational therapy. Feel better in the first few days after an injury. Recover from surgery. Opioids should be taken with the supervision of a trained health care provider. They should be taken for the shortest period of time possible. This is because opioids can be addictive, and the longer you take opioids, the greater your risk of addiction. This addiction can also be called opioid use disorder. What are the risks? Using opioid pain medicines for longer than 3 days increases your risk of side effects. Side effects include: Constipation. Nausea and vomiting. Breathing difficulties (respiratory depression). Drowsiness. Confusion. Opioid use  disorder. Itching. Taking opioid pain medicine for a long period of time can affect your ability to do daily tasks. It also puts you at risk for: Motor vehicle crashes. Depression. Suicide. Heart attack. Overdose, which can be life-threatening. What is a pain treatment plan? A pain treatment plan is an agreement between you and your health care provider. Pain is unique to each person, and treatments vary depending on your condition. To manage your pain, you and your health care provider need to work together. To help you do this: Discuss the goals of your treatment, including how much pain you might expect to have and how you will manage the pain. Review the risks and benefits of taking opioid medicines. Remember that a good treatment plan uses more than one approach and minimizes the chance of side effects. Be honest about the amount of medicines you take and about any drug or alcohol use. Get pain medicine prescriptions from only one health care provider. Pain can be managed with many types of alternative treatments. Ask your health care provider to refer you to one or more specialists who can help you manage pain through: Physical or occupational therapy. Counseling (cognitive behavioral therapy). Good nutrition. Biofeedback. Massage. Meditation. Non-opioid medicine. Following a gentle exercise program. How to use opioid pain medicine Taking medicine Take your pain medicine exactly as told by your health care provider. Take it only when you need it. If your pain gets less severe, you may take less than your prescribed dose if your health care provider approves. If  you are not having pain, do nottake pain medicine unless your health care provider tells you to take it. If your pain is severe, do nottry to treat it yourself by taking more pills than instructed on your prescription. Contact your health care provider for help. Write down the times when you take your pain medicine. It is  easy to become confused while on pain medicine. Writing the time can help you avoid overdose. Take other over-the-counter or prescription medicines only as told by your health care provider. Keeping yourself and others safe  While you are taking opioid pain medicine: Do not drive, use machinery, or power tools. Do not sign legal documents. Do not drink alcohol. Do not take sleeping pills. Do not supervise children by yourself. Do not do activities that require climbing or being in high places. Do not go to a lake, river, ocean, spa, or swimming pool. Do not share your pain medicine with anyone. Keep pain medicine in a locked cabinet or in a secure area where pets and children cannot reach it. Stopping your use of opioids If you have been taking opioid medicine for more than a few weeks, you may need to slowly decrease (taper) how much you take until you stop completely. Tapering your use of opioids can decrease your risk of symptoms of withdrawal, such as: Pain and cramping in the abdomen. Nausea. Sweating. Sleepiness. Restlessness. Uncontrollable shaking (tremors). Cravings for the medicine. Do not attempt to taper your use of opioids on your own. Talk with your health care provider about how to do this. Your health care provider may prescribe a step-down schedule based on how much medicine you are taking and how long you have been taking it. Getting rid of leftover pills Do not save any leftover pills. Get rid of leftover pills safely by: Taking the medicine to a prescription take-back program. This is usually offered by the county or law enforcement. Bringing them to a pharmacy that has a drug disposal container. Flushing them down the toilet. Check the label or package insert of your medicine to see whether this is safe to do. Throwing them out in the trash. Check the label or package insert of your medicine to see whether this is safe to do. If it is safe to throw it out, remove  the medicine from the original container, put it into a sealable bag or container, and mix it with used coffee grounds, food scraps, dirt, or cat litter before putting it in the trash. Follow these instructions at home: Activity Do exercises as told by your health care provider. Avoid activities that make your pain worse. Return to your normal activities as told by your health care provider. Ask your health care provider what activities are safe for you. General instructions You may need to take these actions to prevent or treat constipation: Drink enough fluid to keep your urine pale yellow. Take over-the-counter or prescription medicines. Eat foods that are high in fiber, such as beans, whole grains, and fresh fruits and vegetables. Limit foods that are high in fat and processed sugars, such as fried or sweet foods. Keep all follow-up visits. This is important. Where to find support If you have been taking opioids for a long time, you may benefit from receiving support for quitting from a local support group or counselor. Ask your health care provider for a referral to these resources in your area. Where to find more information Centers for Disease Control and Prevention (CDC): FootballExhibition.com.br [place] Food  and Drug Administration (FDA): PumpkinSearch.com.ee Get help right away if: You may have taken too much of an opioid (overdosed). Common symptoms of an overdose: Your breathing is slower or more shallow than normal. You have a very slow heartbeat (pulse). You have slurred speech. You have nausea and vomiting. Your pupils become very small. You have other potential symptoms: You are very confused. You faint or feel like you will faint. You have cold, clammy skin. You have blue lips or fingernails. You have thoughts of harming yourself or harming others. These symptoms may represent a serious problem that is an emergency. Do not wait to see if the symptoms will go away. Get medical help right away.  Call your local emergency services (911 in the [place] Do not drive yourself to the hospital.  If you ever feel like you may hurt yourself or others, or have thoughts about taking your own life, get help right away. Go to your nearest emergency department or: Call your local emergency services (911 in the [place] Call the [facility] (615-766-5016 in the [place] Call a suicide crisis helpline, such as the National Suicide Prevention Lifeline at 870-684-5459 or 988 in the [place] This is open 24 hours a day in the [place] If you're a Veteran: Call 988 and press 1. This is open 24 hours a day. Text the [employer] at 207-315-2485. Summary Opioid medicines can help you manage moderate to severe pain for a short period of time. A pain treatment plan is an agreement between you and your health care provider. Discuss the goals of your treatment, including how much pain you might expect to have and how you will manage the pain. If you think that you or someone else may have taken too much of an opioid, get medical help right away. This information is not intended to replace advice given to you by your health care provider. Make sure you discuss any questions you have with your health care provider. Document Revised: 11/15/2022 Document Reviewed: 05/21/2020 Elsevier Patient Education  2024 [employer]. Advanced directives: (Copy Requested) Please bring a copy of your health care power of attorney and living will to the office to be added to your chart at your convenience.  Next Medicare Annual Wellness Visit scheduled for next year: Yes

## 2023-04-08 NOTE — Telephone Encounter (Signed)
Pt requesting TOC from [place] to [place].

## 2023-04-08 NOTE — Progress Notes (Addendum)
Subjective:   Darlene Mclaughlin is a 73 y.o. female who presents for Medicare Annual (Subsequent) preventive examination.  Visit Complete: In person  Patient Medicare AWV questionnaire was completed by the patient on 04/03/23; I have confirmed that all information answered by patient is correct and no changes since this date.  Cardiac Risk Factors include: advanced age (>23men, >56 women);hypertension;diabetes mellitus     Objective:    Today's Vitals   04/08/23 1027  BP: 122/64  Pulse: 98  Temp: 98 F (36.7 C)  TempSrc: Oral  SpO2: 99%  Weight: 240 lb 9.6 oz (109.1 kg)  Height: 5\' 5"  (1.651 m)   Body mass index is 40.04 kg/m.     04/08/2023   10:59 AM 04/06/2022    8:09 AM 09/18/2020   11:21 AM 05/22/2020    7:01 AM 05/22/2020    6:54 AM 05/19/2020   10:54 AM 01/15/2020   11:57 AM  Advanced Directives  Does Patient Have a Medical Advance Directive? Yes Yes Yes  Yes Yes Yes  Type of Estate agent of [place];Living will Healthcare Power of [place];Living will;Out of facility DNR (pink MOST or yellow form)  Healthcare Power of [employer] of [place];Living will Healthcare Power of [place];Living will  Does patient want to make changes to medical advance directive?   No - Patient declined No - Patient declined Yes (Inpatient - patient defers changing a medical advance directive and declines information at this time)    Copy of Healthcare Power of Attorney in Chart? No - copy requested No - copy requested     No - copy requested    Current Medications (verified) Outpatient Encounter Medications as of 04/08/2023  Medication Sig   Accu-Chek FastClix Lancets MISC 1 EACH BY DOES NOT APPLY ROUTE DAILY. USE FOR ACCU-CHEK GUIDE   ACCU-CHEK GUIDE test strip USE AS DIRECTED   acetaminophen (TYLENOL) 650 MG CR tablet Take 650 mg by mouth every 8 (eight) hours as needed for pain.   aspirin 81 MG tablet Take 81 mg by mouth daily.   azelastine  (OPTIVAR) 0.05 % ophthalmic solution Place 1 drop into both eyes daily as needed (allergies).   Blood Glucose Monitoring Suppl (ACCU-CHEK GUIDE ME) w/Device KIT USE TO CHECK BLOOD SUGAR ONCE DAILY   CALCIUM PO Take 1,000 mg by mouth daily.   cetirizine (ZYRTEC) 10 MG tablet Take 10 mg by mouth daily as needed for allergies.   chlorthalidone (HYGROTON) 25 MG tablet TAKE 1 TABLET BY MOUTH EVERY DAY   cyclobenzaprine (FLEXERIL) 10 MG tablet Take 1 tablet (10 mg total) by mouth 3 (three) times daily as needed for muscle spasms.   Docusate Sodium (DSS) 100 MG CAPS Take 1 capsule by mouth 2 (two) times daily.   EPIPEN 2-PAK 0.3 MG/0.3ML SOAJ injection Inject 0.3 mg into the muscle as needed for anaphylaxis.   fluticasone (FLONASE) 50 MCG/ACT nasal spray Place 1 spray into both nostrils daily. (Patient taking differently: Place 1 spray into both nostrils daily as needed for allergies.)   KLOR-CON M20 20 MEQ tablet TAKE 1 TABLET BY MOUTH TWICE A DAY   Melatonin 10 MG TABS Take 10 mg by mouth at bedtime as needed (sleep).   metFORMIN (GLUCOPHAGE-XR) 500 MG 24 hr tablet TAKE 2 TABLETS (1,000 MG TOTAL) BY MOUTH DAILY WITH SUPPER   Multiple Vitamin (MULTIVITAMIN) tablet Take 1 tablet by mouth daily.   neomycin-polymyxin-hydrocortisone (CORTISPORIN) 3.5-10000-1 OTIC suspension APPLY 1-2 DROPS DAILY AFTER SOAKING AND  COVER WITH BANDAID   NON FORMULARY EVERY OTHER WEEK ALLERGY INJECTIONS   PROAIR HFA 108 (90 BASE) MCG/ACT inhaler Inhale 1 puff into the lungs every 6 (six) hours as needed for shortness of breath or wheezing.   rosuvastatin (CRESTOR) 5 MG tablet Take 1 tablet (5 mg total) by mouth every Monday, Wednesday, and Friday.   Simethicone (GAS-X PO) Take 1 tablet by mouth daily as needed (gas).   traMADol (ULTRAM) 50 MG tablet Take 1 tablet by mouth every 6 (six) hours as needed.   No facility-administered encounter medications on file as of 04/08/2023.    Allergies (verified) Atorvastatin,  Hydrochlorothiazide, Hydrocodone, Macrobid [nitrofurantoin monohyd macro], Naproxen, Simvastatin, and Zanaflex [tizanidine hcl]   History: Past Medical History:  Diagnosis Date   ALLERGIC RHINITIS 05/15/2008   Allergy    Anemia    ASTHMA 05/15/2008   Asthma    Blood transfusion without reported diagnosis 1991   anemia   Breast cancer (HCC) 2016   right breast   Cancer (HCC)    DCIS right breast   Diabetes mellitus without complication (HCC)    GERD (gastroesophageal reflux disease)    Headache(784.0) 05/15/2008   Hyperlipidemia    HYPERTENSION 05/15/2008   IMPAIRED GLUCOSE TOLERANCE 05/15/2008   Obesity    OSTEOARTHRITIS 05/15/2008   back   Past Surgical History:  Procedure Laterality Date   ABDOMINAL HYSTERECTOMY     ANTERIOR LAT LUMBAR FUSION Left 05/21/2020   Procedure: LUMBAR TWO-THREE, LUMBAR THREE-FOUR DIRECT LATERAL LUMBAR INTERBODY FUSION;  Surgeon: Bedelia Person, MD;  Location: [facility] OR;  Service: Neurosurgery;  Laterality: Left;   APPLICATION OF ROBOTIC ASSISTANCE FOR SPINAL PROCEDURE N/A 05/21/2020   Procedure: APPLICATION OF ROBOTIC ASSISTANCE FOR SPINAL PROCEDURE;  Surgeon: Bedelia Person, MD;  [facility];  Service: Neurosurgery;  Laterality: N/A;   BREAST LUMPECTOMY Right 12/17/2014   BREAST LUMPECTOMY WITH RADIOACTIVE SEED LOCALIZATION Right 12/17/2014   Procedure: RIGHT BREAST RADIOACTIVE SEED GUIDED PARTIAL MASTECTOMY;  Surgeon: Harriette Bouillon, MD;  Location: [facility];  Service: General;  Laterality: Right;   CESAREAN SECTION     x2   COLONOSCOPY     KNEE SURGERY     arthroscopic left   TRANSFORAMINAL LUMBAR INTERBODY FUSION W/ MIS 2 LEVEL Right 05/21/2020   Procedure: LUMBAR FOUR-FIVE, LUMBAR FIVE-SACRAL ONE MINIMALLY INVASIVE TRANSFORAMINAL LUMBAR INTERBODY FUSION WITH INSTRUMENTATION LUMBAR TWO-SACRAL ONE;  Surgeon: Bedelia Person, MD;  Location: [facility] OR;  Service: Neurosurgery;  Laterality: Right;   Family History   Problem Relation Age of Onset   Diabetes Mother    Hypertension Mother    Breast cancer Mother 69       Breast cancer twice    Hyperlipidemia Father    Colon cancer Brother        4 th brother   Diabetes Brother    Pancreatic cancer Brother        middle brother   Stomach cancer Neg Hx    Esophageal cancer Neg Hx    Rectal cancer Neg Hx    Social History   Socioeconomic History   Marital status: Widowed    Spouse name: Not on file   Number of children: Not on file   Years of education: Not on file   Highest education level: Some college, no degree  Occupational History   Occupation: Retired  Tobacco Use   Smoking status: Never   Smokeless tobacco: Never  Vaping Use   Vaping status: Never Used  Substance and Sexual Activity   Alcohol use: Not Currently    Comment: RARELY   Drug use: No   Sexual activity: Yes    Birth control/protection: Surgical  Other Topics Concern   Not on file  Social History Narrative   Not on file   Social Drivers of Health   Financial Resource Strain: Low Risk  (04/08/2023)   Overall Financial Resource Strain (CARDIA)    Difficulty of Paying Living Expenses: Not hard at all  Food Insecurity: No Food Insecurity (04/08/2023)   Hunger Vital Sign    Worried About Running Out of Food in the Last Year: Never true    Ran Out of Food in the Last Year: Never true  Transportation Needs: No Transportation Needs (04/08/2023)   PRAPARE - Administrator, Civil Service (Medical): No    Lack of Transportation (Non-Medical): No  Physical Activity: Sufficiently Active (04/08/2023)   Exercise Vital Sign    Days of Exercise per Week: 3 days    Minutes of Exercise per Session: 60 min  Stress: No Stress Concern Present (04/08/2023)   [employer] of Occupational Health - Occupational Stress Questionnaire    Feeling of Stress : Not at all  Social Connections: Moderately Integrated (04/08/2023)   Social Connection and Isolation Panel [NHANES]     Frequency of Communication with Friends and Family: More than three times a week    Frequency of Social Gatherings with Friends and Family: More than three times a week    Attends Religious Services: More than 4 times per year    Active Member of [employer] or Organizations: Yes    Attends Banker Meetings: More than 4 times per year    Marital Status: Widowed    Tobacco Counseling Counseling given: Not Answered   Clinical Intake:  Pre-visit preparation completed: Yes  Pain : No/denies pain     BMI - recorded: 40.04 Nutritional Status: BMI > 30  Obese Nutritional Risks: None Diabetes: Yes CBG done?: No Did pt. bring in CBG monitor from home?: No  How often do you need to have someone help you when you read instructions, pamphlets, or other written materials from your doctor or pharmacy?: 1 - Never  Interpreter Needed?: No  Information entered by :: Theresa Mulligan LPN   Activities of Daily Living    04/08/2023   10:55 AM 04/03/2023    1:44 AM  In your present state of health, do you have any difficulty performing the following activities:  Hearing? 0 0  Vision? 0 0  Difficulty concentrating or making decisions? 0 0  Walking or climbing stairs? 1 0  Comment Uses a Cane   Dressing or bathing? 0 0  Doing errands, shopping? 0 0  Preparing Food and eating ? N N  Using the Toilet? N N  In the past six months, have you accidently leaked urine? Malvin Johns  Comment Wears Pads. Followed by PCP   Do you have problems with loss of bowel control? N N  Managing your Medications? N N  Managing your Finances? N N  Housekeeping or managing your Housekeeping? N N    Patient Care Team: Philip Aspen, Limmie Patricia, MD as PCP - General (Internal Medicine) Harriette Bouillon, MD as Consulting Physician (General Surgery) Chipper Herb, MD (Inactive) as Consulting Physician (Radiation Oncology) Malachy Mood, MD as Consulting Physician (Hematology) Salomon Fick, NP as  Nurse Practitioner (Hematology and Oncology)  Indicate any recent Medical Services you may have  received from other than [facility] providers in the past year (date may be approximate).     Assessment:   This is a routine wellness examination for Eman.  Hearing/Vision screen Hearing Screening - Comments:: Denies hearing difficulties   Vision Screening - Comments:: Wears rx glasses - up to date with routine eye exams with  [facility]   Goals Addressed               This Visit's Progress     Increase physical activity (pt-stated)        Stay Active.       Depression Screen    04/08/2023   10:38 AM 01/11/2023    1:36 PM 10/11/2022   10:22 AM 07/05/2022   11:20 AM 04/06/2022    8:13 AM 06/30/2021   10:52 AM 04/02/2021   10:12 AM  PHQ 2/9 Scores  PHQ - 2 Score 0 2 0 1 0 0 1  PHQ- 9 Score  6 1 2 1 2 9     Fall Risk    04/08/2023   10:57 AM 04/03/2023    1:44 AM 01/11/2023    1:36 PM 10/11/2022   10:22 AM 07/05/2022   11:19 AM  Fall Risk   Falls in the past year? 0 0 0 0 0  Number falls in past yr: 0 0 0 0 0  Injury with Fall? 0  0 0 1  Risk for fall due to : No Fall Risks      Follow up Falls prevention discussed;Falls evaluation completed  Falls evaluation completed Falls evaluation completed     MEDICARE RISK AT HOME: Medicare Risk at Home Any stairs in or around the home?: No If so, are there any without handrails?: No Home free of loose throw rugs in walkways, pet beds, electrical cords, etc?: Yes Adequate lighting in your home to reduce risk of falls?: Yes Life alert?: No Use of a cane, walker or w/c?: Yes Grab bars in the bathroom?: Yes Shower chair or bench in shower?: Yes Elevated toilet seat or a handicapped toilet?: Yes  TIMED UP AND GO:  Was the test performed?  Yes  Length of time to ambulate 10 feet: 10 sec Gait slow and steady with assistive device    Cognitive Function:    08/22/2017    1:39 PM  MMSE - Mini Mental State Exam  Not completed: --         04/08/2023   10:59 AM 04/06/2022    8:09 AM  6CIT Screen  What Year? 0 points 0 points  What month? 0 points 0 points  What time? 0 points 0 points  Count back from 20 0 points 0 points  Months in reverse 0 points 0 points  Repeat phrase 0 points 0 points  Total Score 0 points 0 points    Immunizations Immunization History  Administered Date(s) Administered   Fluad Quad(high Dose 65+) 11/28/2019, 12/24/2021   Fluad Trivalent(High Dose 65+) 11/22/2022   Influenza Whole 12/21/2011   Influenza, High Dose Seasonal PF 12/20/2014, 12/09/2015, 12/10/2016, 10/12/2018, 12/15/2018, 12/11/2020   Influenza,inj,Quad PF,6+ Mos 12/19/2012, 12/05/2013, 11/26/2014, 12/01/2017   Influenza-Unspecified 11/01/2016   PFIZER Comirnaty(Gray Top)Covid-19 Tri-Sucrose Vaccine 09/08/2020   PFIZER(Purple Top)SARS-COV-2 Vaccination 03/29/2019, 04/24/2019, 08/13/2019, 12/19/2019   PPD Test 10/11/2011   Pfizer Covid-19 Vaccine Bivalent Booster 50yrs & up 05/15/2021   Pneumococcal Conjugate-13 12/05/2013   Pneumococcal Polysaccharide-23 12/20/2014, 01/29/2015, 10/15/2015, 10/25/2017, 12/15/2018, 08/13/2019, 08/13/2020   Tdap 06/04/2013    TDAP status: Up  to date  Flu Vaccine status: Up to date  Pneumococcal vaccine status: Up to date  Covid-19 vaccine status: Declined, Education has been provided regarding the importance of this vaccine but patient still declined. Advised may receive this vaccine at local pharmacy or Health Dept.or vaccine clinic. Aware to provide a copy of the vaccination record if obtained from local pharmacy or Health Dept. Verbalized acceptance and understanding.  Qualifies for Shingles Vaccine? Yes   Zostavax completed No   Shingrix Completed?: No.    Education has been provided regarding the importance of this vaccine. Patient has been advised to call insurance company to determine out of pocket expense if they have not yet received this vaccine. Advised may also receive vaccine at  local pharmacy or Health Dept. Verbalized acceptance and understanding.  Screening Tests Health Maintenance  Topic Date Due   Zoster Vaccines- Shingrix (1 of 2) Never done   COVID-19 Vaccine (7 - 2024-25 season) 10/24/2022   FOOT EXAM  01/26/2023   DTaP/Tdap/Td (2 - Td or Tdap) 06/05/2023   Diabetic kidney evaluation - Urine ACR  07/08/2023   HEMOGLOBIN A1C  07/11/2023   Diabetic kidney evaluation - eGFR measurement  12/08/2023   OPHTHALMOLOGY EXAM  12/24/2023   MAMMOGRAM  01/07/2024   Medicare Annual Wellness (AWV)  04/07/2024   Colonoscopy  11/07/2026   Pneumonia Vaccine 6+ Years old  Completed   INFLUENZA VACCINE  Completed   DEXA SCAN  Completed   Hepatitis C Screening  Completed   HPV VACCINES  Aged Out    Health Maintenance  Health Maintenance Due  Topic Date Due   Zoster Vaccines- Shingrix (1 of 2) Never done   COVID-19 Vaccine (7 - 2024-25 season) 10/24/2022   FOOT EXAM  01/26/2023    Colorectal cancer screening: Type of screening: Colonoscopy. Completed 11/06/21. Repeat every 5 years  Mammogram status: Completed 01/07/23. Repeat every year  Bone Density status: Completed 07/22/21. Results reflect: Bone density results: NORMAL. Repeat every   years.   Additional Screening:  Hepatitis C Screening: does qualify; Completed 07/23/14  Vision Screening: Recommended annual ophthalmology exams for early detection of glaucoma and other disorders of the eye. Is the patient up to date with their annual eye exam?  Yes  Who is the provider or what is the name of the office in which the patient attends annual eye exams? [facility] If pt is not established with a provider, would they like to be referred to a provider to establish care? No .   Dental Screening: Recommended annual dental exams for proper oral hygiene  Diabetic Foot Exam: Diabetic Foot Exam: Overdue, Pt has been advised about the importance in completing this exam. Pt is scheduled for diabetic foot exam on  Deferred.  Community Resource Referral / Chronic Care Management:  CRR required this visit?  No   CCM required this visit?  No     Plan:     I have personally reviewed and noted the following in the patient's chart:   Medical and social history Use of alcohol, tobacco or illicit drugs  Current medications and supplements including opioid prescriptions. Patient is currently taking opioid prescriptions. Information provided to patient regarding non-opioid alternatives. Patient advised to discuss non-opioid treatment plan with their provider. Functional ability and status Nutritional status Physical activity Advanced directives List of other physicians Hospitalizations, surgeries, and ER visits in previous 12 months Vitals Screenings to include cognitive, depression, and falls Referrals and appointments  In addition, I have  reviewed and discussed with patient certain preventive protocols, quality metrics, and best practice recommendations. A written personalized care plan for preventive services as well as general preventive health recommendations were provided to patient.     Tillie Rung, LPN   1/61/0960   After Visit Summary: (In Person-Printed) AVS printed and given to the patient  Nurse Notes: None

## 2023-04-14 ENCOUNTER — Ambulatory Visit: Payer: Medicare PPO | Admitting: Internal Medicine

## 2023-04-14 NOTE — Progress Notes (Signed)
 ANNUAL DIABETIC FOOT EXAM  Subjective: Darlene Mclaughlin presents today for annual diabetic foot exam.  Chief Complaint  Patient presents with   [facility]    She is here for nail trim, PCP is Dr. Loreta Ave, last seen in 11/24, last A1C was "7 something"    Patient confirms h/o diabetes.  Patient denies any h/o foot wounds.  Patient has been diagnosed with neuropathy.  Philip Aspen, Darlene Patricia, MD is patient's PCP.  Past Medical History:  Diagnosis Date   ALLERGIC RHINITIS 05/15/2008   Allergy    Anemia    ASTHMA 05/15/2008   Asthma    Blood transfusion without reported diagnosis 1991   anemia   Breast cancer (HCC) 2016   right breast   Cancer (HCC)    DCIS right breast   Diabetes mellitus without complication (HCC)    GERD (gastroesophageal reflux disease)    Headache(784.0) 05/15/2008   Hyperlipidemia    HYPERTENSION 05/15/2008   IMPAIRED GLUCOSE TOLERANCE 05/15/2008   Obesity    OSTEOARTHRITIS 05/15/2008   back   Patient Active Problem List   Diagnosis Date Noted   Closed fracture of lateral malleolus of right fibula 02/20/2021   Lumbar radiculopathy 05/21/2020   Statin intolerance 01/16/2018   Bilateral leg edema 12/21/2017   Bilateral lower extremity edema 12/21/2017   History of colonic polyps 10/18/2017   Dyslipidemia 10/18/2017   History of colon polyps 10/18/2017   Essential hypertension 03/26/2016   Family history of colon cancer 03/26/2016   Osteopenia 08/28/2015   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   Spinal stenosis of lumbar region 12/05/2013   Morbid obesity (HCC) 12/19/2012   Diabetes mellitus with coincident hypertension (HCC) 05/15/2008   Allergic rhinitis 05/15/2008   Asthma, mild intermittent 05/15/2008   Osteoarthritis 05/15/2008   HEADACHE 05/15/2008   Past Surgical History:  Procedure Laterality Date   ABDOMINAL HYSTERECTOMY     ANTERIOR LAT LUMBAR FUSION Left 05/21/2020   Procedure: LUMBAR TWO-THREE, LUMBAR  THREE-FOUR DIRECT LATERAL LUMBAR INTERBODY FUSION;  Surgeon: Bedelia Person, MD;  Location: [facility] OR;  Service: Neurosurgery;  Laterality: Left;   APPLICATION OF ROBOTIC ASSISTANCE FOR SPINAL PROCEDURE N/A 05/21/2020   Procedure: APPLICATION OF ROBOTIC ASSISTANCE FOR SPINAL PROCEDURE;  Surgeon: Bedelia Person, MD;  Location: [facility] OR;  Service: Neurosurgery;  Laterality: N/A;   BREAST LUMPECTOMY Right 12/17/2014   BREAST LUMPECTOMY WITH RADIOACTIVE SEED LOCALIZATION Right 12/17/2014   Procedure: RIGHT BREAST RADIOACTIVE SEED GUIDED PARTIAL MASTECTOMY;  Surgeon: Harriette Bouillon, MD;  Location: [facility];  Service: General;  Laterality: Right;   CESAREAN SECTION     x2   COLONOSCOPY     KNEE SURGERY     arthroscopic left   TRANSFORAMINAL LUMBAR INTERBODY FUSION W/ MIS 2 LEVEL Right 05/21/2020   Procedure: LUMBAR FOUR-FIVE, LUMBAR FIVE-SACRAL ONE MINIMALLY INVASIVE TRANSFORAMINAL LUMBAR INTERBODY FUSION WITH INSTRUMENTATION LUMBAR TWO-SACRAL ONE;  Surgeon: Bedelia Person, MD;  Location: [facility] OR;  Service: Neurosurgery;  Laterality: Right;   Current Outpatient Medications on File Prior to Visit  Medication Sig Dispense Refill   Accu-Chek FastClix Lancets MISC 1 EACH BY DOES NOT APPLY ROUTE DAILY. USE FOR ACCU-CHEK GUIDE 102 each 3   ACCU-CHEK GUIDE test strip USE AS DIRECTED 100 strip 3   acetaminophen (TYLENOL) 650 MG CR tablet Take 650 mg by mouth every 8 (eight) hours as needed for pain.     aspirin 81 MG tablet Take 81 mg by mouth  daily.     azelastine (OPTIVAR) 0.05 % ophthalmic solution Place 1 drop into both eyes daily as needed (allergies).     Blood Glucose Monitoring Suppl (ACCU-CHEK GUIDE ME) w/Device KIT USE TO CHECK BLOOD SUGAR ONCE DAILY 1 kit 1   CALCIUM PO Take 1,000 mg by mouth daily.     cetirizine (ZYRTEC) 10 MG tablet Take 10 mg by mouth daily as needed for allergies.     chlorthalidone (HYGROTON) 25 MG tablet TAKE 1 TABLET BY MOUTH EVERY DAY 90 tablet 1    cyclobenzaprine (FLEXERIL) 10 MG tablet Take 1 tablet (10 mg total) by mouth 3 (three) times daily as needed for muscle spasms. 60 tablet 0   Docusate Sodium (DSS) 100 MG CAPS Take 1 capsule by mouth 2 (two) times daily.     EPIPEN 2-PAK 0.3 MG/0.3ML SOAJ injection Inject 0.3 mg into the muscle as needed for anaphylaxis.  1   fluticasone (FLONASE) 50 MCG/ACT nasal spray Place 1 spray into both nostrils daily. (Patient taking differently: Place 1 spray into both nostrils daily as needed for allergies.) 16 g 5   KLOR-CON M20 20 MEQ tablet TAKE 1 TABLET BY MOUTH TWICE A DAY 180 tablet 1   Melatonin 10 MG TABS Take 10 mg by mouth at bedtime as needed (sleep).     metFORMIN (GLUCOPHAGE-XR) 500 MG 24 hr tablet TAKE 2 TABLETS (1,000 MG TOTAL) BY MOUTH DAILY WITH SUPPER 180 tablet 1   Multiple Vitamin (MULTIVITAMIN) tablet Take 1 tablet by mouth daily.     neomycin-polymyxin-hydrocortisone (CORTISPORIN) 3.5-10000-1 OTIC suspension APPLY 1-2 DROPS DAILY AFTER SOAKING AND COVER WITH BANDAID 10 mL 0   NON FORMULARY EVERY OTHER WEEK ALLERGY INJECTIONS     PROAIR HFA 108 (90 BASE) MCG/ACT inhaler Inhale 1 puff into the lungs every 6 (six) hours as needed for shortness of breath or wheezing.  0   rosuvastatin (CRESTOR) 5 MG tablet Take 1 tablet (5 mg total) by mouth every Monday, Wednesday, and Friday. 90 tablet 1   Simethicone (GAS-X PO) Take 1 tablet by mouth daily as needed (gas).     traMADol (ULTRAM) 50 MG tablet Take 1 tablet by mouth every 6 (six) hours as needed.     No current facility-administered medications on file prior to visit.    Allergies  Allergen Reactions   Atorvastatin     Hallucinations    Hydrochlorothiazide     Muscle pain   Hydrocodone     Swollen tongue   Macrobid [Nitrofurantoin Monohyd Macro]      Liver problems   Naproxen Swelling    Mouth and tongue swelling   Simvastatin     Hallucinations, muscle pains   Zanaflex [Tizanidine Hcl] Other (See Comments)     Liver  problems   Social History   Occupational History   Occupation: Retired  Tobacco Use   Smoking status: Never   Smokeless tobacco: Never  Vaping Use   Vaping status: Never Used  Substance and Sexual Activity   Alcohol use: Not Currently    Comment: RARELY   Drug use: No   Sexual activity: Yes    Birth control/protection: Surgical   Family History  Problem Relation Age of Onset   Diabetes Mother    Hypertension Mother    Breast cancer Mother 11       Breast cancer twice    Hyperlipidemia Father    Colon cancer Brother        4 th brother  Diabetes Brother    Pancreatic cancer Brother        middle brother   Stomach cancer Neg Hx    Esophageal cancer Neg Hx    Rectal cancer Neg Hx    Immunization History  Administered Date(s) Administered   Fluad Quad(high Dose 65+) 11/28/2019, 12/24/2021   Fluad Trivalent(High Dose 65+) 11/22/2022   Influenza Whole 12/21/2011   Influenza, High Dose Seasonal PF 12/20/2014, 12/09/2015, 12/10/2016, 10/12/2018, 12/15/2018, 12/11/2020   Influenza,inj,Quad PF,6+ Mos 12/19/2012, 12/05/2013, 11/26/2014, 12/01/2017   Influenza-Unspecified 11/01/2016   PFIZER Comirnaty(Gray Top)Covid-19 Tri-Sucrose Vaccine 09/08/2020   PFIZER(Purple Top)SARS-COV-2 Vaccination 03/29/2019, 04/24/2019, 08/13/2019, 12/19/2019   PPD Test 10/11/2011   Pfizer Covid-19 Vaccine Bivalent Booster 51yrs & up 05/15/2021   Pneumococcal Conjugate-13 12/05/2013   Pneumococcal Polysaccharide-23 12/20/2014, 01/29/2015, 10/15/2015, 10/25/2017, 12/15/2018, 08/13/2019, 08/13/2020   Tdap 06/04/2013     Review of Systems: Negative except as noted in the HPI.   Objective: There were no vitals filed for this visit.  JAQUAYA COYLE is a pleasant 73 y.o. female in NAD. AAO X 3.  Title   Diabetic Foot Exam - detailed Date & Time: 04/06/2023  2:45 PM Diabetic Foot exam was performed with the following findings: Yes  Visual Foot Exam completed.: Yes  Is there a history of foot  ulcer?: No Is there a foot ulcer now?: No Is there swelling?: No Is there elevated skin temperature?: No Is there abnormal foot shape?: No Is there a claw toe deformity?: No Are the toenails long?: Yes Are the toenails thick?: Yes Are the toenails ingrown?: No Is the skin thin, fragile, shiny and hairless?": Yes Normal Range of Motion?: Yes Is there foot or ankle muscle weakness?: No Do you have pain in calf while walking?: No Are the shoes appropriate in style and fit?: Yes Can the patient see the bottom of their feet?: Yes Pulse Foot Exam completed.: Yes   Right Posterior Tibialis: Diminished Left posterior Tibialis: Diminished   Right Dorsalis Pedis: Diminished Left Dorsalis Pedis: Present     Sensory Foot Exam Completed.: Yes Semmes-Weinstein Monofilament Test "+" means "has sensation" and "-" means "no sensation"  R Foot Test Control: Pos L Foot Test Control: Pos   R Site 1-Great Toe: Pos L Site 1-Great Toe: Pos   R Site 4: Pos L Site 4: Pos   R site 5: Pos L Site 5: Pos  R Site 6: Pos L Site 6: Pos     Image components are not supported.   Image components are not supported. Image components are not supported.  Tuning [place]ork Right vibratory: present Left vibratory: present  Comments Capillary refill time delayed b/l. No ischemia, gangrene, nor rest pain.  RLE warm to cool when compared to contralateral extremity.  Mycotic nails x 10.  HAV with bunion b/l and hammertoes 2-5 b/l.     Lab Results  Component Value Date   HGBA1C 7.4 (A) 01/11/2023   ADA Risk Categorization: High Risk  Patient has one or more of the following: Loss of protective sensation Absent pedal pulses Severe Foot deformity History of foot ulcer  Assessment: 1. Pain due to onychomycosis of toenails of both feet   2. Hallux valgus, acquired, bilateral   3. Acquired hammertoes of both feet   4. 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 Type II diabetes mellitus with  peripheral circulatory disorder (HCC)   6. Encounter for diabetic foot exam (HCC)      Plan: VAS [place]  ABI WITH/WO TBI  Diabetic foot examination performed today. All patient's and/or POA's questions/concerns addressed on today's visit. Toenails 1-5 debrided in length and girth without incident. Continue foot and shoe inspections daily. Monitor blood glucose per PCP/Endocrinologist's recommendations. Continue soft, supportive shoe gear daily. Report any pedal injuries to medical professional. Call office if there are any questions/concerns. -Due to risk factor(s), ordered noninvasive arterial studies ABIs with and without TBIs for b/l lower extremities. -Patient/POA to call should there be question/concern in the interim. Return in about 3 months (around 07/04/2023).  Freddie Breech, DPM      [facility] LOCATION: [address] [place] [address]                   Office [phone]   [facility] LOCATION: [address] [place], [place] [address] Office [phone]

## 2023-04-15 DIAGNOSIS — J3089 Other allergic rhinitis: Secondary | ICD-10-CM | POA: Diagnosis not present

## 2023-04-15 DIAGNOSIS — J301 Allergic rhinitis due to pollen: Secondary | ICD-10-CM | POA: Diagnosis not present

## 2023-04-18 ENCOUNTER — Ambulatory Visit (HOSPITAL_COMMUNITY)
Admission: RE | Admit: 2023-04-18 | Discharge: 2023-04-18 | Disposition: A | Discharge status: Home / Self Care | Payer: Medicare PPO | Source: Ambulatory Visit | Attending: Podiatry | Admitting: Podiatry

## 2023-04-18 DIAGNOSIS — I739 Peripheral vascular disease, unspecified: Secondary | ICD-10-CM | POA: Diagnosis not present

## 2023-04-18 DIAGNOSIS — E1151 Type 2 diabetes mellitus with diabetic peripheral angiopathy without gangrene: Secondary | ICD-10-CM | POA: Diagnosis not present

## 2023-04-18 NOTE — Progress Notes (Signed)
 VASCULAR LAB    ABI has been performed.  See CV proc for preliminary results.   Lynee Rosenbach, RVT 04/18/2023, 1:28 PM

## 2023-04-19 LAB — VAS US ABI WITH/WO TBI
Left ABI: 1.01
Right ABI: 0.94

## 2023-04-21 ENCOUNTER — Ambulatory Visit: Payer: Medicare PPO | Admitting: Internal Medicine

## 2023-04-21 ENCOUNTER — Encounter: Payer: Self-pay | Admitting: Internal Medicine

## 2023-04-21 VITALS — BP 110/70 | HR 93 | Temp 98.2°F | Wt 237.2 lb

## 2023-04-21 DIAGNOSIS — Z7984 Long term (current) use of oral hypoglycemic drugs: Secondary | ICD-10-CM

## 2023-04-21 DIAGNOSIS — E119 Type 2 diabetes mellitus without complications: Secondary | ICD-10-CM | POA: Diagnosis not present

## 2023-04-21 DIAGNOSIS — E785 Hyperlipidemia, unspecified: Secondary | ICD-10-CM | POA: Diagnosis not present

## 2023-04-21 DIAGNOSIS — J3089 Other allergic rhinitis: Secondary | ICD-10-CM | POA: Diagnosis not present

## 2023-04-21 DIAGNOSIS — J3081 Allergic rhinitis due to animal (cat) (dog) hair and dander: Secondary | ICD-10-CM | POA: Diagnosis not present

## 2023-04-21 DIAGNOSIS — I10 Essential (primary) hypertension: Secondary | ICD-10-CM | POA: Diagnosis not present

## 2023-04-21 DIAGNOSIS — J301 Allergic rhinitis due to pollen: Secondary | ICD-10-CM | POA: Diagnosis not present

## 2023-04-21 LAB — POCT GLYCOSYLATED HEMOGLOBIN (HGB A1C): Hemoglobin A1C: 6.9 % — AB (ref 4.0–5.6)

## 2023-04-21 NOTE — Progress Notes (Signed)
 Established Patient Office Visit     CC/Reason for Visit: Follow-up chronic conditions  HPI: Darlene Mclaughlin is a 73 y.o. female who is coming in today for the above mentioned reasons. Past Medical History is significant for: Hypertension, hyperlipidemia type 2 diabetes, morbid obesity, history of lumbar spinal stenosis.  Feeling well without major concerns or complaints.  She continues to work on lifestyle changes, has dropped an additional 4 pounds since her last visit.  She is still not taking the rosuvastatin even on a 3-day a week schedule.   Past Medical/Surgical History: Past Medical History:  Diagnosis Date   ALLERGIC RHINITIS 05/15/2008   Allergy    Anemia    ASTHMA 05/15/2008   Asthma    Blood transfusion without reported diagnosis 1991   anemia   Breast cancer (HCC) 2016   right breast   Cancer (HCC)    DCIS right breast   Diabetes mellitus without complication (HCC)    GERD (gastroesophageal reflux disease)    Headache(784.0) 05/15/2008   Hyperlipidemia    HYPERTENSION 05/15/2008   IMPAIRED GLUCOSE TOLERANCE 05/15/2008   Obesity    OSTEOARTHRITIS 05/15/2008   back    Past Surgical History:  Procedure Laterality Date   ABDOMINAL HYSTERECTOMY     ANTERIOR LAT LUMBAR FUSION Left 05/21/2020   Procedure: LUMBAR TWO-THREE, LUMBAR THREE-FOUR DIRECT LATERAL LUMBAR INTERBODY FUSION;  Surgeon: Bedelia Person, MD;  Location: [facility] OR;  Service: Neurosurgery;  Laterality: Left;   APPLICATION OF ROBOTIC ASSISTANCE FOR SPINAL PROCEDURE N/A 05/21/2020   Procedure: APPLICATION OF ROBOTIC ASSISTANCE FOR SPINAL PROCEDURE;  Surgeon: Bedelia Person, MD;  Location: [facility] OR;  Service: Neurosurgery;  Laterality: N/A;   BREAST LUMPECTOMY Right 12/17/2014   BREAST LUMPECTOMY WITH RADIOACTIVE SEED LOCALIZATION Right 12/17/2014   Procedure: RIGHT BREAST RADIOACTIVE SEED GUIDED PARTIAL MASTECTOMY;  Surgeon: Harriette Bouillon, MD;  Location: [facility];  Service:  General;  Laterality: Right;   CESAREAN SECTION     x2   COLONOSCOPY     KNEE SURGERY     arthroscopic left   TRANSFORAMINAL LUMBAR INTERBODY FUSION W/ MIS 2 LEVEL Right 05/21/2020   Procedure: LUMBAR FOUR-FIVE, LUMBAR FIVE-SACRAL ONE MINIMALLY INVASIVE TRANSFORAMINAL LUMBAR INTERBODY FUSION WITH INSTRUMENTATION LUMBAR TWO-SACRAL ONE;  Surgeon: Bedelia Person, MD;  Location: [facility] OR;  Service: Neurosurgery;  Laterality: Right;    Social History:  reports that she has never smoked. She has never used smokeless tobacco. She reports that she does not currently use alcohol. She reports that she does not use drugs.  Allergies: Allergies  Allergen Reactions   Atorvastatin     Hallucinations    Hydrochlorothiazide     Muscle pain   Hydrocodone     Swollen tongue   Macrobid [Nitrofurantoin Monohyd Macro]      Liver problems   Naproxen Swelling    Mouth and tongue swelling   Simvastatin     Hallucinations, muscle pains   Zanaflex [Tizanidine Hcl] Other (See Comments)     Liver problems    Family History:  Family History  Problem Relation Age of Onset   Diabetes Mother    Hypertension Mother    Breast cancer Mother 2       Breast cancer twice    Hyperlipidemia Father    Colon cancer Brother        4 th brother   Diabetes Brother    Pancreatic cancer Brother  middle brother   Stomach cancer Neg Hx    Esophageal cancer Neg Hx    Rectal cancer Neg Hx      Current Outpatient Medications:    Accu-Chek FastClix Lancets MISC, 1 EACH BY DOES NOT APPLY ROUTE DAILY. USE FOR ACCU-CHEK GUIDE, Disp: 102 each, Rfl: 3   ACCU-CHEK GUIDE test strip, USE AS DIRECTED, Disp: 100 strip, Rfl: 3   acetaminophen (TYLENOL) 650 MG CR tablet, Take 650 mg by mouth every 8 (eight) hours as needed for pain., Disp: , Rfl:    aspirin 81 MG tablet, Take 81 mg by mouth daily., Disp: , Rfl:    azelastine (OPTIVAR) 0.05 % ophthalmic solution, Place 1 drop into both eyes daily as needed  (allergies)., Disp: , Rfl:    Blood Glucose Monitoring Suppl (ACCU-CHEK GUIDE ME) w/Device KIT, USE TO CHECK BLOOD SUGAR ONCE DAILY, Disp: 1 kit, Rfl: 1   CALCIUM PO, Take 1,000 mg by mouth daily., Disp: , Rfl:    cetirizine (ZYRTEC) 10 MG tablet, Take 10 mg by mouth daily as needed for allergies., Disp: , Rfl:    chlorthalidone (HYGROTON) 25 MG tablet, TAKE 1 TABLET BY MOUTH EVERY DAY, Disp: 90 tablet, Rfl: 1   cyclobenzaprine (FLEXERIL) 10 MG tablet, Take 1 tablet (10 mg total) by mouth 3 (three) times daily as needed for muscle spasms., Disp: 60 tablet, Rfl: 0   Docusate Sodium (DSS) 100 MG CAPS, Take 1 capsule by mouth 2 (two) times daily., Disp: , Rfl:    EPIPEN 2-PAK 0.3 MG/0.3ML SOAJ injection, Inject 0.3 mg into the muscle as needed for anaphylaxis., Disp: , Rfl: 1   fluticasone (FLONASE) 50 MCG/ACT nasal spray, Place 1 spray into both nostrils daily. (Patient taking differently: Place 1 spray into both nostrils daily as needed for allergies.), Disp: 16 g, Rfl: 5   KLOR-CON M20 20 MEQ tablet, TAKE 1 TABLET BY MOUTH TWICE A DAY, Disp: 180 tablet, Rfl: 1   Melatonin 10 MG TABS, Take 10 mg by mouth at bedtime as needed (sleep)., Disp: , Rfl:    metFORMIN (GLUCOPHAGE-XR) 500 MG 24 hr tablet, TAKE 2 TABLETS (1,000 MG TOTAL) BY MOUTH DAILY WITH SUPPER, Disp: 180 tablet, Rfl: 1   Multiple Vitamin (MULTIVITAMIN) tablet, Take 1 tablet by mouth daily., Disp: , Rfl:    neomycin-polymyxin-hydrocortisone (CORTISPORIN) 3.5-10000-1 OTIC suspension, APPLY 1-2 DROPS DAILY AFTER SOAKING AND COVER WITH BANDAID, Disp: 10 mL, Rfl: 0   NON FORMULARY, EVERY OTHER WEEK ALLERGY INJECTIONS, Disp: , Rfl:    PROAIR HFA 108 (90 BASE) MCG/ACT inhaler, Inhale 1 puff into the lungs every 6 (six) hours as needed for shortness of breath or wheezing., Disp: , Rfl: 0   rosuvastatin (CRESTOR) 5 MG tablet, Take 1 tablet (5 mg total) by mouth every Monday, Wednesday, and Friday., Disp: 90 tablet, Rfl: 1   Simethicone (GAS-X PO),  Take 1 tablet by mouth daily as needed (gas)., Disp: , Rfl:    traMADol (ULTRAM) 50 MG tablet, Take 1 tablet by mouth every 6 (six) hours as needed., Disp: , Rfl:   Review of Systems:  Negative unless indicated in HPI.   Physical Exam: Vitals:   04/21/23 1139  BP: 110/70  Pulse: 93  Temp: 98.2 F (36.8 C)  TempSrc: Oral  SpO2: 99%  Weight: 237 lb 3.2 oz (107.6 kg)    Body mass index is 39.47 kg/m.   Physical Exam Vitals reviewed.  Constitutional:      Appearance: Normal appearance. She is  obese.  HENT:     Head: Normocephalic and atraumatic.  Eyes:     Conjunctiva/sclera: Conjunctivae normal.  Cardiovascular:     Rate and Rhythm: Normal rate and regular rhythm.  Pulmonary:     Effort: Pulmonary effort is normal.     Breath sounds: Normal breath sounds.  Skin:    General: Skin is warm and dry.  Neurological:     General: No focal deficit present.     Mental Status: She is alert and oriented to person, place, and time.  Psychiatric:        Mood and Affect: Mood normal.        Behavior: Behavior normal.        Thought Content: Thought content normal.        Judgment: Judgment normal.      Impression and Plan:  Diabetes mellitus with coincident hypertension (HCC) -     POCT glycosylated hemoglobin (Hb A1C)  Essential hypertension  Dyslipidemia  Morbid obesity (HCC)  -Improved diabetic control with an A1c at 6.9, continue to work on lifestyle changes. -Blood pressure is well-controlled on current. -Cholesterol remains elevated above goal, she declines statin therapy. -Discussed healthy lifestyle, including increased physical activity and better food choices to promote weight loss.    Time spent:30 minutes reviewing chart, interviewing and examining patient and formulating plan of care.     Chaya Jan, MD [facility]

## 2023-04-24 ENCOUNTER — Encounter: Payer: Self-pay | Admitting: Podiatry

## 2023-04-24 ENCOUNTER — Other Ambulatory Visit (INDEPENDENT_AMBULATORY_CARE_PROVIDER_SITE_OTHER): Payer: Self-pay | Admitting: Podiatry

## 2023-04-24 DIAGNOSIS — E1151 Type 2 diabetes mellitus with diabetic peripheral angiopathy without gangrene: Secondary | ICD-10-CM

## 2023-04-24 DIAGNOSIS — I739 Peripheral vascular disease, unspecified: Secondary | ICD-10-CM

## 2023-04-24 NOTE — Progress Notes (Signed)
 1. Cold foot with peripheral vascular disease (HCC)   2. Type II diabetes mellitus with peripheral circulatory disorder [facility])    Orders Placed This Encounter  Procedures   Ambulatory referral to Vascular Surgery

## 2023-04-26 DIAGNOSIS — J3089 Other allergic rhinitis: Secondary | ICD-10-CM | POA: Diagnosis not present

## 2023-04-26 DIAGNOSIS — J301 Allergic rhinitis due to pollen: Secondary | ICD-10-CM | POA: Diagnosis not present

## 2023-04-28 DIAGNOSIS — J3089 Other allergic rhinitis: Secondary | ICD-10-CM | POA: Diagnosis not present

## 2023-04-28 DIAGNOSIS — J301 Allergic rhinitis due to pollen: Secondary | ICD-10-CM | POA: Diagnosis not present

## 2023-05-10 DIAGNOSIS — J301 Allergic rhinitis due to pollen: Secondary | ICD-10-CM | POA: Diagnosis not present

## 2023-05-10 DIAGNOSIS — J3089 Other allergic rhinitis: Secondary | ICD-10-CM | POA: Diagnosis not present

## 2023-05-10 DIAGNOSIS — J3081 Allergic rhinitis due to animal (cat) (dog) hair and dander: Secondary | ICD-10-CM | POA: Diagnosis not present

## 2023-05-18 ENCOUNTER — Ambulatory Visit: Admitting: Vascular Surgery

## 2023-05-18 ENCOUNTER — Encounter: Payer: Self-pay | Admitting: Vascular Surgery

## 2023-05-18 VITALS — BP 133/85 | HR 104 | Temp 97.9°F | Ht 65.0 in | Wt 239.0 lb

## 2023-05-18 DIAGNOSIS — I739 Peripheral vascular disease, unspecified: Secondary | ICD-10-CM | POA: Diagnosis not present

## 2023-05-18 NOTE — Progress Notes (Signed)
 Patient ID: Darlene Mclaughlin, female   DOB: Apr 01, 1950, 73 y.o.   MRN: [id number]  Reason for Consult: New Patient (Initial Visit)   Referred by Freddie Breech, DPM  Subjective:     HPI:  Darlene Mclaughlin is a 73 y.o. female without significant vascular history.  She does have diabetes, hyperlipidemia and hypertension which she states are well-controlled.  She is a lifelong non-smoker.  She does have leg pain which limits her walking but denies any frank claudication.  She does not have tissue loss or ulceration.  She states that she has a previous injury to the right foot and now the right foot occasionally turns to a different color and feels cold.  She does not currently have any pain in her foot and she is followed closely by podiatry.    Past Medical History:  Diagnosis Date   ALLERGIC RHINITIS 05/15/2008   Allergy    Anemia    ASTHMA 05/15/2008   Asthma    Blood transfusion without reported diagnosis 1991   anemia   Breast cancer (HCC) 2016   right breast   Cancer (HCC)    DCIS right breast   Diabetes mellitus without complication (HCC)    GERD (gastroesophageal reflux disease)    Headache(784.0) 05/15/2008   Hyperlipidemia    HYPERTENSION 05/15/2008   IMPAIRED GLUCOSE TOLERANCE 05/15/2008   Obesity    OSTEOARTHRITIS 05/15/2008   back   Family History  Problem Relation Age of Onset   Diabetes Mother    Hypertension Mother    Breast cancer Mother 72       Breast cancer twice    Hyperlipidemia Father    Colon cancer Brother        4 th brother   Diabetes Brother    Pancreatic cancer Brother        middle brother   Stomach cancer Neg Hx    Esophageal cancer Neg Hx    Rectal cancer Neg Hx    Past Surgical History:  Procedure Laterality Date   ABDOMINAL HYSTERECTOMY     ANTERIOR LAT LUMBAR FUSION Left 05/21/2020   Procedure: LUMBAR TWO-THREE, LUMBAR THREE-FOUR DIRECT LATERAL LUMBAR INTERBODY FUSION;  Surgeon: Bedelia Person, MD;  Location: [facility] OR;   Service: Neurosurgery;  Laterality: Left;   APPLICATION OF ROBOTIC ASSISTANCE FOR SPINAL PROCEDURE N/A 05/21/2020   Procedure: APPLICATION OF ROBOTIC ASSISTANCE FOR SPINAL PROCEDURE;  Surgeon: Bedelia Person, MD;  Location: [facility];  Service: Neurosurgery;  Laterality: N/A;   BREAST LUMPECTOMY Right 12/17/2014   BREAST LUMPECTOMY WITH RADIOACTIVE SEED LOCALIZATION Right 12/17/2014   Procedure: RIGHT BREAST RADIOACTIVE SEED GUIDED PARTIAL MASTECTOMY;  Surgeon: Harriette Bouillon, MD;  Location: [facility];  Service: General;  Laterality: Right;   CESAREAN SECTION     x2   COLONOSCOPY     KNEE SURGERY     arthroscopic left   TRANSFORAMINAL LUMBAR INTERBODY FUSION W/ MIS 2 LEVEL Right 05/21/2020   Procedure: LUMBAR FOUR-FIVE, LUMBAR FIVE-SACRAL ONE MINIMALLY INVASIVE TRANSFORAMINAL LUMBAR INTERBODY FUSION WITH INSTRUMENTATION LUMBAR TWO-SACRAL ONE;  Surgeon: Bedelia Person, MD;  Location: [facility] OR;  Service: Neurosurgery;  Laterality: Right;    Short Social History:  Social History   Tobacco Use   Smoking status: Never   Smokeless tobacco: Never  Substance Use Topics   Alcohol use: Not Currently    Comment: RARELY    Allergies  Allergen Reactions   Atorvastatin     Hallucinations  Hydrochlorothiazide     Muscle pain   Hydrocodone     Swollen tongue   Macrobid [Nitrofurantoin Monohyd Macro]      Liver problems   Naproxen Swelling    Mouth and tongue swelling   Simvastatin     Hallucinations, muscle pains   Zanaflex [Tizanidine Hcl] Other (See Comments)     Liver problems    Current Outpatient Medications  Medication Sig Dispense Refill   Accu-Chek FastClix Lancets MISC 1 EACH BY DOES NOT APPLY ROUTE DAILY. USE FOR ACCU-CHEK GUIDE 102 each 3   ACCU-CHEK GUIDE test strip USE AS DIRECTED 100 strip 3   acetaminophen (TYLENOL) 650 MG CR tablet Take 650 mg by mouth every 8 (eight) hours as needed for pain.     aspirin 81 MG tablet Take 81 mg by mouth daily.      azelastine (OPTIVAR) 0.05 % ophthalmic solution Place 1 drop into both eyes daily as needed (allergies).     Blood Glucose Monitoring Suppl (ACCU-CHEK GUIDE ME) w/Device KIT USE TO CHECK BLOOD SUGAR ONCE DAILY 1 kit 1   CALCIUM PO Take 1,000 mg by mouth daily.     cetirizine (ZYRTEC) 10 MG tablet Take 10 mg by mouth daily as needed for allergies.     chlorthalidone (HYGROTON) 25 MG tablet TAKE 1 TABLET BY MOUTH EVERY DAY 90 tablet 1   cyclobenzaprine (FLEXERIL) 10 MG tablet Take 1 tablet (10 mg total) by mouth 3 (three) times daily as needed for muscle spasms. 60 tablet 0   Docusate Sodium (DSS) 100 MG CAPS Take 1 capsule by mouth 2 (two) times daily.     EPIPEN 2-PAK 0.3 MG/0.3ML SOAJ injection Inject 0.3 mg into the muscle as needed for anaphylaxis.  1   fluticasone (FLONASE) 50 MCG/ACT nasal spray Place 1 spray into both nostrils daily. (Patient taking differently: Place 1 spray into both nostrils daily as needed for allergies.) 16 g 5   KLOR-CON M20 20 MEQ tablet TAKE 1 TABLET BY MOUTH TWICE A DAY 180 tablet 1   Melatonin 10 MG TABS Take 10 mg by mouth at bedtime as needed (sleep).     metFORMIN (GLUCOPHAGE-XR) 500 MG 24 hr tablet TAKE 2 TABLETS (1,000 MG TOTAL) BY MOUTH DAILY WITH SUPPER 180 tablet 1   Multiple Vitamin (MULTIVITAMIN) tablet Take 1 tablet by mouth daily.     neomycin-polymyxin-hydrocortisone (CORTISPORIN) 3.5-10000-1 OTIC suspension APPLY 1-2 DROPS DAILY AFTER SOAKING AND COVER WITH BANDAID 10 mL 0   NON FORMULARY EVERY OTHER WEEK ALLERGY INJECTIONS     PROAIR HFA 108 (90 BASE) MCG/ACT inhaler Inhale 1 puff into the lungs every 6 (six) hours as needed for shortness of breath or wheezing.  0   rosuvastatin (CRESTOR) 5 MG tablet Take 1 tablet (5 mg total) by mouth every Monday, Wednesday, and Friday. 90 tablet 1   Simethicone (GAS-X PO) Take 1 tablet by mouth daily as needed (gas).     traMADol (ULTRAM) 50 MG tablet Take 1 tablet by mouth every 6 (six) hours as needed.     No  current facility-administered medications for this visit.    Review of Systems  Constitutional:  Constitutional negative. HENT: HENT negative.  Eyes: Eyes negative.  Respiratory: Respiratory negative.  Cardiovascular: Positive for leg swelling.  GI: Gastrointestinal negative.  Musculoskeletal: Positive for leg pain.  Skin: Skin negative.  Neurological: Neurological negative. Hematologic: Hematologic/lymphatic negative.  Psychiatric: Psychiatric negative.        Objective:  Objective  Vitals:   05/18/23 1059  BP: 133/85  Pulse: (!) 104  Temp: 97.9 F (36.6 C)  SpO2: 97%  Weight: 239 lb (108.4 kg)  Height: 5\' 5"  (1.651 m)   Body mass index is 39.77 kg/m.  Physical Exam Constitutional:      Appearance: She is obese.  HENT:     Head: Normocephalic.     Nose: Nose normal.  Eyes:     Pupils: Pupils are equal, round, and reactive to light.  Cardiovascular:     Pulses:          Dorsalis pedis pulses are 1+ on the right side and 2+ on the left side.  Pulmonary:     Effort: Pulmonary effort is normal.  Abdominal:     General: Abdomen is flat.  Musculoskeletal:     Right lower leg: No edema.     Left lower leg: No edema.  Skin:    General: Skin is warm.     Capillary Refill: Capillary refill takes less than 2 seconds.  Neurological:     General: No focal deficit present.     Mental Status: She is alert.  Psychiatric:        Mood and Affect: Mood normal.        Thought Content: Thought content normal.        Judgment: Judgment normal.     Data: ABI Findings:  +---------+------------------+-----+-----------+--------+  Right   Rt Pressure (mmHg)IndexWaveform   Comment   +---------+------------------+-----+-----------+--------+  Apolinar Junes Brachial 134                    multiphasic          +---------+------------------+-----+-----------+--------+  PTA     131               0.94 multiphasic           +---------+------------------+-----+-----------+--------+  DP      130               0.93 multiphasic          +---------+------------------+-----+-----------+--------+  Great Toe101               0.72                      +---------+------------------+-----+-----------+--------+   +---------+------------------+-----+-----------+-------+  Left    Lt Pressure (mmHg)IndexWaveform   Comment  +---------+------------------+-----+-----------+-------+  Brachial 140                    multiphasic         +---------+------------------+-----+-----------+-------+  PTA     141               1.01 multiphasic         +---------+------------------+-----+-----------+-------+  DP      134               0.96 multiphasic         +---------+------------------+-----+-----------+-------+  Great Toe104               0.74                     +---------+------------------+-----+-----------+-------+   +-------+-----------+-----------+------------+------------+  ABI/TBIToday's ABIToday's TBIPrevious ABIPrevious TBI  +-------+-----------+-----------+------------+------------+  Right 0.94       0.72       1.18        0.86          +-------+-----------+-----------+------------+------------+  Left  1.01  0.74       1.2         0.98          +-------+-----------+-----------+------------+------------+       Bilateral ABIs and TBIs appear decreased compared to prior study on  12/05/19.    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 The right toe-brachial index is normal.   Normal TBI with abnormal great toe waveform.  Left: Resting left ankle-brachial index is within normal range. The left  toe-brachial index is normal.       Assessment/Plan:    73 year old female with right lower extremity coolness to touch of her right foot.  ABIs and pulses are preserved.  She can follow-up  with me on an as-needed basis.     Maeola Harman MD [name]

## 2023-05-20 DIAGNOSIS — J3081 Allergic rhinitis due to animal (cat) (dog) hair and dander: Secondary | ICD-10-CM | POA: Diagnosis not present

## 2023-05-20 DIAGNOSIS — J301 Allergic rhinitis due to pollen: Secondary | ICD-10-CM | POA: Diagnosis not present

## 2023-05-20 DIAGNOSIS — J3089 Other allergic rhinitis: Secondary | ICD-10-CM | POA: Diagnosis not present

## 2023-05-25 DIAGNOSIS — J3081 Allergic rhinitis due to animal (cat) (dog) hair and dander: Secondary | ICD-10-CM | POA: Diagnosis not present

## 2023-05-25 DIAGNOSIS — J3089 Other allergic rhinitis: Secondary | ICD-10-CM | POA: Diagnosis not present

## 2023-05-25 DIAGNOSIS — J301 Allergic rhinitis due to pollen: Secondary | ICD-10-CM | POA: Diagnosis not present

## 2023-05-31 DIAGNOSIS — J3089 Other allergic rhinitis: Secondary | ICD-10-CM | POA: Diagnosis not present

## 2023-05-31 DIAGNOSIS — J301 Allergic rhinitis due to pollen: Secondary | ICD-10-CM | POA: Diagnosis not present

## 2023-05-31 DIAGNOSIS — J3081 Allergic rhinitis due to animal (cat) (dog) hair and dander: Secondary | ICD-10-CM | POA: Diagnosis not present

## 2023-06-09 DIAGNOSIS — J3081 Allergic rhinitis due to animal (cat) (dog) hair and dander: Secondary | ICD-10-CM | POA: Diagnosis not present

## 2023-06-09 DIAGNOSIS — J301 Allergic rhinitis due to pollen: Secondary | ICD-10-CM | POA: Diagnosis not present

## 2023-06-09 DIAGNOSIS — J3089 Other allergic rhinitis: Secondary | ICD-10-CM | POA: Diagnosis not present

## 2023-06-14 ENCOUNTER — Telehealth: Payer: Self-pay

## 2023-06-14 NOTE — Telephone Encounter (Signed)
 Copied from CRM [id number]. Topic: General - Other >> Jun 14, 2023  3:38 PM Dorisann Garre T wrote: Reason for CRM: patient is needing a call back regarding a message that was sent through [place] she would like a call back

## 2023-06-15 DIAGNOSIS — J3089 Other allergic rhinitis: Secondary | ICD-10-CM | POA: Diagnosis not present

## 2023-06-15 DIAGNOSIS — J3081 Allergic rhinitis due to animal (cat) (dog) hair and dander: Secondary | ICD-10-CM | POA: Diagnosis not present

## 2023-06-15 DIAGNOSIS — J301 Allergic rhinitis due to pollen: Secondary | ICD-10-CM | POA: Diagnosis not present

## 2023-06-15 NOTE — Telephone Encounter (Signed)
 Left a detailed message requesting the patient call back with more information as to the Mychart message she is referring to and what questions she has.

## 2023-06-21 DIAGNOSIS — J3081 Allergic rhinitis due to animal (cat) (dog) hair and dander: Secondary | ICD-10-CM | POA: Diagnosis not present

## 2023-06-21 DIAGNOSIS — J301 Allergic rhinitis due to pollen: Secondary | ICD-10-CM | POA: Diagnosis not present

## 2023-06-21 DIAGNOSIS — J3089 Other allergic rhinitis: Secondary | ICD-10-CM | POA: Diagnosis not present

## 2023-06-22 ENCOUNTER — Other Ambulatory Visit: Payer: Self-pay | Admitting: Internal Medicine

## 2023-06-27 ENCOUNTER — Encounter: Payer: Self-pay | Admitting: Internal Medicine

## 2023-06-27 ENCOUNTER — Ambulatory Visit: Admitting: Internal Medicine

## 2023-06-27 VITALS — BP 120/70 | HR 105 | Temp 98.6°F | Wt 239.1 lb

## 2023-06-27 DIAGNOSIS — J3081 Allergic rhinitis due to animal (cat) (dog) hair and dander: Secondary | ICD-10-CM | POA: Diagnosis not present

## 2023-06-27 DIAGNOSIS — J301 Allergic rhinitis due to pollen: Secondary | ICD-10-CM | POA: Diagnosis not present

## 2023-06-27 DIAGNOSIS — H6123 Impacted cerumen, bilateral: Secondary | ICD-10-CM | POA: Diagnosis not present

## 2023-06-27 DIAGNOSIS — J3089 Other allergic rhinitis: Secondary | ICD-10-CM | POA: Diagnosis not present

## 2023-06-27 NOTE — Progress Notes (Signed)
 Established Patient Office Visit     CC/Reason for Visit: Bilateral hearing loss  HPI: Darlene Mclaughlin is a 73 y.o. female who is coming in today for the above mentioned reasons.  For the past week has been experiencing bilateral hearing loss.  She thought it was related to congestion from allergies and has been taking antihistamines and Mucinex without much relief.   Past Medical/Surgical History: Past Medical History:  Diagnosis Date   ALLERGIC RHINITIS 05/15/2008   Allergy    Anemia    ASTHMA 05/15/2008   Asthma    Blood transfusion without reported diagnosis 1991   anemia   Breast cancer (HCC) 2016   right breast   Cancer (HCC)    DCIS right breast   Diabetes mellitus without complication (HCC)    GERD (gastroesophageal reflux disease)    Headache(784.0) 05/15/2008   Hyperlipidemia    HYPERTENSION 05/15/2008   IMPAIRED GLUCOSE TOLERANCE 05/15/2008   Obesity    OSTEOARTHRITIS 05/15/2008   back    Past Surgical History:  Procedure Laterality Date   ABDOMINAL HYSTERECTOMY     ANTERIOR LAT LUMBAR FUSION Left 05/21/2020   Procedure: LUMBAR TWO-THREE, LUMBAR THREE-FOUR DIRECT LATERAL LUMBAR INTERBODY FUSION;  Surgeon: Van Gelinas, MD;  Location: [facility] OR;  Service: Neurosurgery;  Laterality: Left;   APPLICATION OF ROBOTIC ASSISTANCE FOR SPINAL PROCEDURE N/A 05/21/2020   Procedure: APPLICATION OF ROBOTIC ASSISTANCE FOR SPINAL PROCEDURE;  Surgeon: Van Gelinas, MD;  Location: [facility] OR;  Service: Neurosurgery;  Laterality: N/A;   BREAST LUMPECTOMY Right 12/17/2014   BREAST LUMPECTOMY WITH RADIOACTIVE SEED LOCALIZATION Right 12/17/2014   Procedure: RIGHT BREAST RADIOACTIVE SEED GUIDED PARTIAL MASTECTOMY;  Surgeon: Sim Dryer, MD;  Location: [facility];  Service: General;  Laterality: Right;   CESAREAN SECTION     x2   COLONOSCOPY     KNEE SURGERY     arthroscopic left   TRANSFORAMINAL LUMBAR INTERBODY FUSION W/ MIS 2 LEVEL Right  05/21/2020   Procedure: LUMBAR FOUR-FIVE, LUMBAR FIVE-SACRAL ONE MINIMALLY INVASIVE TRANSFORAMINAL LUMBAR INTERBODY FUSION WITH INSTRUMENTATION LUMBAR TWO-SACRAL ONE;  Surgeon: Van Gelinas, MD;  Location: [facility] OR;  Service: Neurosurgery;  Laterality: Right;    Social History:  reports that she has never smoked. She has never used smokeless tobacco. She reports that she does not currently use alcohol. She reports that she does not use drugs.  Allergies: Allergies  Allergen Reactions   Atorvastatin      Hallucinations    Hydrochlorothiazide      Muscle pain   Hydrocodone     Swollen tongue   Macrobid  [Nitrofurantoin  Monohyd Macro]      Liver problems   Naproxen  Swelling    Mouth and tongue swelling   Simvastatin      Hallucinations, muscle pains   Zanaflex  [Tizanidine  Hcl] Other (See Comments)     Liver problems    Family History:  Family History  Problem Relation Age of Onset   Diabetes Mother    Hypertension Mother    Breast cancer Mother 8       Breast cancer twice    Hyperlipidemia Father    Colon cancer Brother        4 th brother   Diabetes Brother    Pancreatic cancer Brother        middle brother   Stomach cancer Neg Hx    Esophageal cancer Neg Hx    Rectal cancer Neg Hx      Current  Outpatient Medications:    Accu-Chek FastClix Lancets MISC, 1 EACH BY DOES NOT APPLY ROUTE DAILY. USE FOR ACCU-CHEK GUIDE, Disp: 102 each, Rfl: 3   ACCU-CHEK GUIDE test strip, USE AS DIRECTED, Disp: 100 strip, Rfl: 3   acetaminophen  (TYLENOL ) 650 MG CR tablet, Take 650 mg by mouth every 8 (eight) hours as needed for pain., Disp: , Rfl:    aspirin 81 MG tablet, Take 81 mg by mouth daily., Disp: , Rfl:    azelastine (OPTIVAR) 0.05 % ophthalmic solution, Place 1 drop into both eyes daily as needed (allergies)., Disp: , Rfl:    Blood Glucose Monitoring Suppl (ACCU-CHEK GUIDE ME) w/Device KIT, USE TO CHECK BLOOD SUGAR ONCE DAILY, Disp: 1 kit, Rfl: 1   CALCIUM  PO, Take 1,000 mg by  mouth daily., Disp: , Rfl:    cetirizine (ZYRTEC) 10 MG tablet, Take 10 mg by mouth daily as needed for allergies., Disp: , Rfl:    chlorthalidone  (HYGROTON ) 25 MG tablet, TAKE 1 TABLET BY MOUTH EVERY DAY, Disp: 90 tablet, Rfl: 1   cyclobenzaprine  (FLEXERIL ) 10 MG tablet, Take 1 tablet (10 mg total) by mouth 3 (three) times daily as needed for muscle spasms., Disp: 60 tablet, Rfl: 0   Docusate Sodium  (DSS) 100 MG CAPS, Take 1 capsule by mouth 2 (two) times daily., Disp: , Rfl:    EPIPEN  2-PAK 0.3 MG/0.3ML SOAJ injection, Inject 0.3 mg into the muscle as needed for anaphylaxis., Disp: , Rfl: 1   fluticasone  (FLONASE ) 50 MCG/ACT nasal spray, Place 1 spray into both nostrils daily. (Patient taking differently: Place 1 spray into both nostrils daily as needed for allergies.), Disp: 16 g, Rfl: 5   KLOR-CON  M20 20 MEQ tablet, TAKE 1 TABLET BY MOUTH TWICE A DAY, Disp: 180 tablet, Rfl: 1   Melatonin 10 MG TABS, Take 10 mg by mouth at bedtime as needed (sleep)., Disp: , Rfl:    metFORMIN  (GLUCOPHAGE -XR) 500 MG 24 hr tablet, TAKE 2 TABLETS (1,000 MG TOTAL) BY MOUTH DAILY WITH SUPPER, Disp: 180 tablet, Rfl: 1   Multiple Vitamin (MULTIVITAMIN) tablet, Take 1 tablet by mouth daily., Disp: , Rfl:    neomycin -polymyxin-hydrocortisone (CORTISPORIN) 3.5-10000-1 OTIC suspension, APPLY 1-2 DROPS DAILY AFTER SOAKING AND COVER WITH BANDAID, Disp: 10 mL, Rfl: 0   NON FORMULARY, EVERY OTHER WEEK ALLERGY INJECTIONS, Disp: , Rfl:    PROAIR HFA 108 (90 BASE) MCG/ACT inhaler, Inhale 1 puff into the lungs every 6 (six) hours as needed for shortness of breath or wheezing., Disp: , Rfl: 0   rosuvastatin  (CRESTOR ) 5 MG tablet, Take 1 tablet (5 mg total) by mouth every Monday, Wednesday, and Friday., Disp: 90 tablet, Rfl: 1   Simethicone  (GAS-X PO), Take 1 tablet by mouth daily as needed (gas)., Disp: , Rfl:    traMADol  (ULTRAM ) 50 MG tablet, Take 1 tablet by mouth every 6 (six) hours as needed., Disp: , Rfl:   Review of Systems:   Negative unless indicated in HPI.   Physical Exam: Vitals:   06/27/23 0730  BP: 120/70  Pulse: (!) 105  Temp: 98.6 F (37 C)  TempSrc: Oral  SpO2: 98%  Weight: 239 lb 1.6 oz (108.5 kg)    Body mass index is 39.79 kg/m.   Physical Exam HENT:     Right Ear: There is impacted cerumen.     Left Ear: There is impacted cerumen.      Impression and Plan:  Bilateral hearing loss due to cerumen impaction  Cerumen Desimpaction  -  After patient consent was obtained, warm water was applied and gentle ear lavage performed on bilateral ears. There were no complications and following the desimpaction the tympanic membranes were visible. Tympanic membranes are intact following the procedure. Auditory canals are normal. The patient reported relief of symptoms after removal of cerumen.    Time spent:22 minutes reviewing chart, interviewing and examining patient and formulating plan of care.     Marguerita Shih, MD [facility]

## 2023-07-08 DIAGNOSIS — J3089 Other allergic rhinitis: Secondary | ICD-10-CM | POA: Diagnosis not present

## 2023-07-08 DIAGNOSIS — J301 Allergic rhinitis due to pollen: Secondary | ICD-10-CM | POA: Diagnosis not present

## 2023-07-12 DIAGNOSIS — J301 Allergic rhinitis due to pollen: Secondary | ICD-10-CM | POA: Diagnosis not present

## 2023-07-12 DIAGNOSIS — J3081 Allergic rhinitis due to animal (cat) (dog) hair and dander: Secondary | ICD-10-CM | POA: Diagnosis not present

## 2023-07-12 DIAGNOSIS — J3089 Other allergic rhinitis: Secondary | ICD-10-CM | POA: Diagnosis not present

## 2023-07-12 DIAGNOSIS — H1045 Other chronic allergic conjunctivitis: Secondary | ICD-10-CM | POA: Diagnosis not present

## 2023-07-12 DIAGNOSIS — J4520 Mild intermittent asthma, uncomplicated: Secondary | ICD-10-CM | POA: Diagnosis not present

## 2023-07-21 DIAGNOSIS — J301 Allergic rhinitis due to pollen: Secondary | ICD-10-CM | POA: Diagnosis not present

## 2023-07-21 DIAGNOSIS — J3089 Other allergic rhinitis: Secondary | ICD-10-CM | POA: Diagnosis not present

## 2023-07-28 ENCOUNTER — Encounter: Payer: Self-pay | Admitting: Internal Medicine

## 2023-07-28 ENCOUNTER — Ambulatory Visit: Admitting: Internal Medicine

## 2023-07-28 VITALS — BP 120/70 | HR 105 | Temp 98.4°F | Wt 235.5 lb

## 2023-07-28 DIAGNOSIS — J301 Allergic rhinitis due to pollen: Secondary | ICD-10-CM | POA: Diagnosis not present

## 2023-07-28 DIAGNOSIS — J3081 Allergic rhinitis due to animal (cat) (dog) hair and dander: Secondary | ICD-10-CM | POA: Diagnosis not present

## 2023-07-28 DIAGNOSIS — I10 Essential (primary) hypertension: Secondary | ICD-10-CM | POA: Diagnosis not present

## 2023-07-28 DIAGNOSIS — Z7984 Long term (current) use of oral hypoglycemic drugs: Secondary | ICD-10-CM

## 2023-07-28 DIAGNOSIS — J3089 Other allergic rhinitis: Secondary | ICD-10-CM | POA: Diagnosis not present

## 2023-07-28 DIAGNOSIS — E119 Type 2 diabetes mellitus without complications: Secondary | ICD-10-CM

## 2023-07-28 DIAGNOSIS — E785 Hyperlipidemia, unspecified: Secondary | ICD-10-CM | POA: Diagnosis not present

## 2023-07-28 LAB — POCT GLYCOSYLATED HEMOGLOBIN (HGB A1C): Hemoglobin A1C: 6.8 % — AB (ref 4.0–5.6)

## 2023-07-28 MED ORDER — METFORMIN HCL ER 500 MG PO TB24: 1000.0000 mg | ORAL_TABLET | Freq: Every day | ORAL | 1 refills | Status: DC

## 2023-07-28 NOTE — Progress Notes (Signed)
 Established Patient Office Visit     CC/Reason for Visit: Follow-up chronic medical conditions  HPI: Darlene Mclaughlin is a 73 y.o. female who is coming in today for the above mentioned reasons. Past Medical History is significant for: Hypertension, hyperlipidemia, morbid obesity, type 2 diabetes, lumbar spinal stenosis.  Feeling well without concerns or complaints today.   Past Medical/Surgical History: Past Medical History:  Diagnosis Date   ALLERGIC RHINITIS 05/15/2008   Allergy    Anemia    ASTHMA 05/15/2008   Asthma    Blood transfusion without reported diagnosis 1991   anemia   Breast cancer (HCC) 2016   right breast   Cancer (HCC)    DCIS right breast   Diabetes mellitus without complication (HCC)    GERD (gastroesophageal reflux disease)    Headache(784.0) 05/15/2008   Hyperlipidemia    HYPERTENSION 05/15/2008   IMPAIRED GLUCOSE TOLERANCE 05/15/2008   Obesity    OSTEOARTHRITIS 05/15/2008   back    Past Surgical History:  Procedure Laterality Date   ABDOMINAL HYSTERECTOMY     ANTERIOR LAT LUMBAR FUSION Left 05/21/2020   Procedure: LUMBAR TWO-THREE, LUMBAR THREE-FOUR DIRECT LATERAL LUMBAR INTERBODY FUSION;  Surgeon: Van Gelinas, MD;  Location: [facility] OR;  Service: Neurosurgery;  Laterality: Left;   APPLICATION OF ROBOTIC ASSISTANCE FOR SPINAL PROCEDURE N/A 05/21/2020   Procedure: APPLICATION OF ROBOTIC ASSISTANCE FOR SPINAL PROCEDURE;  Surgeon: Van Gelinas, MD;  Location: [facility] OR;  Service: Neurosurgery;  Laterality: N/A;   BREAST LUMPECTOMY Right 12/17/2014   BREAST LUMPECTOMY WITH RADIOACTIVE SEED LOCALIZATION Right 12/17/2014   Procedure: RIGHT BREAST RADIOACTIVE SEED GUIDED PARTIAL MASTECTOMY;  Surgeon: Sim Dryer, MD;  Location: [facility];  Service: General;  Laterality: Right;   CESAREAN SECTION     x2   COLONOSCOPY     KNEE SURGERY     arthroscopic left   TRANSFORAMINAL LUMBAR INTERBODY FUSION W/ MIS 2 LEVEL Right  05/21/2020   Procedure: LUMBAR FOUR-FIVE, LUMBAR FIVE-SACRAL ONE MINIMALLY INVASIVE TRANSFORAMINAL LUMBAR INTERBODY FUSION WITH INSTRUMENTATION LUMBAR TWO-SACRAL ONE;  Surgeon: Van Gelinas, MD;  Location: [facility] OR;  Service: Neurosurgery;  Laterality: Right;    Social History:  reports that she has never smoked. She has never used smokeless tobacco. She reports that she does not currently use alcohol. She reports that she does not use drugs.  Allergies: Allergies  Allergen Reactions   Atorvastatin      Hallucinations    Hydrochlorothiazide      Muscle pain   Hydrocodone     Swollen tongue   Macrobid  [Nitrofurantoin  Monohyd Macro]      Liver problems   Naproxen  Swelling    Mouth and tongue swelling   Simvastatin      Hallucinations, muscle pains   Zanaflex  [Tizanidine  Hcl] Other (See Comments)     Liver problems    Family History:  Family History  Problem Relation Age of Onset   Diabetes Mother    Hypertension Mother    Breast cancer Mother 67       Breast cancer twice    Hyperlipidemia Father    Colon cancer Brother        4 th brother   Diabetes Brother    Pancreatic cancer Brother        middle brother   Stomach cancer Neg Hx    Esophageal cancer Neg Hx    Rectal cancer Neg Hx      Current Outpatient Medications:    Accu-Chek  FastClix Lancets MISC, 1 EACH BY DOES NOT APPLY ROUTE DAILY. USE FOR ACCU-CHEK GUIDE, Disp: 102 each, Rfl: 3   ACCU-CHEK GUIDE test strip, USE AS DIRECTED, Disp: 100 strip, Rfl: 3   acetaminophen  (TYLENOL ) 650 MG CR tablet, Take 650 mg by mouth every 8 (eight) hours as needed for pain., Disp: , Rfl:    aspirin 81 MG tablet, Take 81 mg by mouth daily., Disp: , Rfl:    azelastine (OPTIVAR) 0.05 % ophthalmic solution, Place 1 drop into both eyes daily as needed (allergies)., Disp: , Rfl:    Blood Glucose Monitoring Suppl (ACCU-CHEK GUIDE ME) w/Device KIT, USE TO CHECK BLOOD SUGAR ONCE DAILY, Disp: 1 kit, Rfl: 1   CALCIUM  PO, Take 1,000 mg by  mouth daily., Disp: , Rfl:    cetirizine (ZYRTEC) 10 MG tablet, Take 10 mg by mouth daily as needed for allergies., Disp: , Rfl:    chlorthalidone  (HYGROTON ) 25 MG tablet, TAKE 1 TABLET BY MOUTH EVERY DAY, Disp: 90 tablet, Rfl: 1   cyclobenzaprine  (FLEXERIL ) 10 MG tablet, Take 1 tablet (10 mg total) by mouth 3 (three) times daily as needed for muscle spasms., Disp: 60 tablet, Rfl: 0   Docusate Sodium  (DSS) 100 MG CAPS, Take 1 capsule by mouth 2 (two) times daily., Disp: , Rfl:    EPIPEN  2-PAK 0.3 MG/0.3ML SOAJ injection, Inject 0.3 mg into the muscle as needed for anaphylaxis., Disp: , Rfl: 1   fluticasone  (FLONASE ) 50 MCG/ACT nasal spray, Place 1 spray into both nostrils daily. (Patient taking differently: Place 1 spray into both nostrils daily as needed for allergies.), Disp: 16 g, Rfl: 5   KLOR-CON  M20 20 MEQ tablet, TAKE 1 TABLET BY MOUTH TWICE A DAY, Disp: 180 tablet, Rfl: 1   Melatonin 10 MG TABS, Take 10 mg by mouth at bedtime as needed (sleep)., Disp: , Rfl:    Multiple Vitamin (MULTIVITAMIN) tablet, Take 1 tablet by mouth daily., Disp: , Rfl:    neomycin -polymyxin-hydrocortisone (CORTISPORIN) 3.5-10000-1 OTIC suspension, APPLY 1-2 DROPS DAILY AFTER SOAKING AND COVER WITH BANDAID, Disp: 10 mL, Rfl: 0   NON FORMULARY, EVERY OTHER WEEK ALLERGY INJECTIONS, Disp: , Rfl:    PROAIR HFA 108 (90 BASE) MCG/ACT inhaler, Inhale 1 puff into the lungs every 6 (six) hours as needed for shortness of breath or wheezing., Disp: , Rfl: 0   rosuvastatin  (CRESTOR ) 5 MG tablet, Take 1 tablet (5 mg total) by mouth every Monday, Wednesday, and Friday., Disp: 90 tablet, Rfl: 1   Simethicone  (GAS-X PO), Take 1 tablet by mouth daily as needed (gas)., Disp: , Rfl:    traMADol  (ULTRAM ) 50 MG tablet, Take 1 tablet by mouth every 6 (six) hours as needed., Disp: , Rfl:    metFORMIN  (GLUCOPHAGE -XR) 500 MG 24 hr tablet, Take 2 tablets (1,000 mg total) by mouth daily with supper., Disp: 180 tablet, Rfl: 1  Review of  Systems:  Negative unless indicated in HPI.   Physical Exam: Vitals:   07/28/23 0806  BP: 120/70  Pulse: (!) 105  Temp: 98.4 F (36.9 C)  TempSrc: Oral  SpO2: 99%  Weight: 235 lb 8 oz (106.8 kg)    Body mass index is 39.19 kg/m.   Physical Exam Vitals reviewed.  Constitutional:      Appearance: Normal appearance. She is obese.  HENT:     Head: Normocephalic and atraumatic.  Eyes:     Conjunctiva/sclera: Conjunctivae normal.  Cardiovascular:     Rate and Rhythm: Normal rate and regular  rhythm.  Pulmonary:     Effort: Pulmonary effort is normal.     Breath sounds: Normal breath sounds.  Skin:    General: Skin is warm and dry.  Neurological:     General: No focal deficit present.     Mental Status: She is alert and oriented to person, place, and time.  Psychiatric:        Mood and Affect: Mood normal.        Behavior: Behavior normal.        Thought Content: Thought content normal.        Judgment: Judgment normal.      Impression and Plan:  Diabetes mellitus with coincident hypertension (HCC) -     Microalbumin / creatinine urine ratio; Future -     POCT glycosylated hemoglobin (Hb A1C) -     metFORMIN  HCl ER; Take 2 tablets (1,000 mg total) by mouth daily with supper.  Dispense: 180 tablet; Refill: 1  Dyslipidemia  Essential hypertension  - Blood pressure is well-controlled on current. - A1c demonstrates good diabetic control at 6.8.Aaron Aas - Lipids are not optimal, however she refuses to try statins due to past side effects.   Time spent:32 minutes reviewing chart, interviewing and examining patient and formulating plan of care.     Marguerita Shih, MD [facility]

## 2023-08-04 DIAGNOSIS — J301 Allergic rhinitis due to pollen: Secondary | ICD-10-CM | POA: Diagnosis not present

## 2023-08-04 DIAGNOSIS — J3089 Other allergic rhinitis: Secondary | ICD-10-CM | POA: Diagnosis not present

## 2023-08-09 ENCOUNTER — Encounter: Payer: Self-pay | Admitting: Podiatry

## 2023-08-09 ENCOUNTER — Ambulatory Visit: Payer: Medicare PPO | Admitting: Podiatry

## 2023-08-09 VITALS — Ht 65.0 in | Wt 235.5 lb

## 2023-08-09 DIAGNOSIS — M79674 Pain in right toe(s): Secondary | ICD-10-CM

## 2023-08-09 DIAGNOSIS — M79675 Pain in left toe(s): Secondary | ICD-10-CM | POA: Diagnosis not present

## 2023-08-09 DIAGNOSIS — B351 Tinea unguium: Secondary | ICD-10-CM

## 2023-08-09 DIAGNOSIS — E1151 Type 2 diabetes mellitus with diabetic peripheral angiopathy without gangrene: Secondary | ICD-10-CM

## 2023-08-12 DIAGNOSIS — J3081 Allergic rhinitis due to animal (cat) (dog) hair and dander: Secondary | ICD-10-CM | POA: Diagnosis not present

## 2023-08-12 DIAGNOSIS — J301 Allergic rhinitis due to pollen: Secondary | ICD-10-CM | POA: Diagnosis not present

## 2023-08-12 DIAGNOSIS — J3089 Other allergic rhinitis: Secondary | ICD-10-CM | POA: Diagnosis not present

## 2023-08-14 NOTE — Progress Notes (Signed)
 Subjective:  Patient ID: Darlene Mclaughlin, female    DOB: August 14, 1950,  MRN: [id number]  Darlene Mclaughlin presents to clinic today for at risk foot care. Pt has h/o NIDDM with PAD and painful elongated mycotic toenails 1-5 bilaterally which are tender when wearing enclosed shoe gear. Pain is relieved with periodic professional debridement.  Chief Complaint  Patient presents with   Nail Problem    Pt is here for [facility] last A1C was 6.9  PCP is Dr Theophilus and LOV was a week ago    New problem(s): None.   PCP is Theophilus Andrews, Tully GRADE, MD.  Allergies  Allergen Reactions   Atorvastatin      Hallucinations    Hydrochlorothiazide      Muscle pain   Hydrocodone     Swollen tongue   Macrobid  [Nitrofurantoin  Monohyd Macro]      Liver problems   Naproxen  Swelling    Mouth and tongue swelling   Simvastatin      Hallucinations, muscle pains   Zanaflex  [Tizanidine  Hcl] Other (See Comments)     Liver problems    Review of Systems: Negative except as noted in the HPI.  Objective: No changes noted in today's physical examination. There were no vitals filed for this visit. Darlene Mclaughlin is a pleasant 73 y.o. female in NAD. AAO x 3.   Vascular Examination: CFT <4 seconds b/l. Nonpalpable pulses RLE and nonpalpable PT LLE. Faintly palpable DP pulses LLE.  Digital hair absent. Skin temperature gradient warm to cool b/l. No ischemia or gangrene. No cyanosis or clubbing noted b/l. No edema noted b/l LE.   Neurological Examination: Sensation grossly intact b/l with 10 gram monofilament. Vibratory sensation intact b/l.   Dermatological Examination: Pedal skin thin, shiny and atrophic b/l. No open wounds. No interdigital macerations.   Toenails 1-5 b/l thick, discolored, elongated with subungual debris and pain on dorsal palpation.   Incurvated nailplate lateral border left hallux.  Nail border hypertrophy minimal. There is tenderness to palpation. Sign(s) of infection: no clinical signs of  infection noted on examination today.. No hyperkeratotic nor porokeratotic lesions present on today's visit.  Musculoskeletal Examination: Muscle strength 5/5 to all lower extremity muscle groups bilaterally. HAV with bunion deformity noted b/l LE. Hammertoe(s) 2-5 b/l.  Radiographs: None  Last A1c:      Latest Ref Rng & Units 07/28/2023    8:09 AM 04/21/2023   11:40 AM 01/11/2023    1:10 PM 10/11/2022   10:01 AM  Hemoglobin A1C  Hemoglobin-A1c 4.0 - 5.6 % 6.8  6.9  7.4  7.7     Assessment/Plan: 1. Pain due to onychomycosis of toenails of both feet   2. Type II diabetes mellitus with peripheral circulatory disorder [facility])    -Patient was evaluated today. All questions/concerns addressed on today's visit. -Continue supportive shoe gear daily. -Mycotic toenails 2-5 bilaterally were debrided in length and girth with sterile nail nippers and dremel without iatrogenic bleeding. -No invasive procedure(s) performed. Offending nail border debrided and curretaged left great toe utilizing sterile nail nipper and currette. Border cleansed with alcohol and triple antibiotic ointment applied. No further treatment required by patient/caregiver. Call office if there are any concerns. -Patient/POA to call should there be question/concern in the interim.   Return in about 3 months (around 11/09/2023).  Delon LITTIE Merlin, DPM      [place] LOCATION: [address]  [place], [place] [address]                   Office [phone]   [facility] LOCATION: [address] [place], [place] [address] Office [phone]

## 2023-08-18 DIAGNOSIS — J3089 Other allergic rhinitis: Secondary | ICD-10-CM | POA: Diagnosis not present

## 2023-08-18 DIAGNOSIS — J3081 Allergic rhinitis due to animal (cat) (dog) hair and dander: Secondary | ICD-10-CM | POA: Diagnosis not present

## 2023-08-18 DIAGNOSIS — J301 Allergic rhinitis due to pollen: Secondary | ICD-10-CM | POA: Diagnosis not present

## 2023-08-24 DIAGNOSIS — J3089 Other allergic rhinitis: Secondary | ICD-10-CM | POA: Diagnosis not present

## 2023-08-24 DIAGNOSIS — J3081 Allergic rhinitis due to animal (cat) (dog) hair and dander: Secondary | ICD-10-CM | POA: Diagnosis not present

## 2023-08-24 DIAGNOSIS — J301 Allergic rhinitis due to pollen: Secondary | ICD-10-CM | POA: Diagnosis not present

## 2023-09-01 DIAGNOSIS — J3089 Other allergic rhinitis: Secondary | ICD-10-CM | POA: Diagnosis not present

## 2023-09-01 DIAGNOSIS — J301 Allergic rhinitis due to pollen: Secondary | ICD-10-CM | POA: Diagnosis not present

## 2023-09-01 DIAGNOSIS — J3081 Allergic rhinitis due to animal (cat) (dog) hair and dander: Secondary | ICD-10-CM | POA: Diagnosis not present

## 2023-09-08 DIAGNOSIS — J3089 Other allergic rhinitis: Secondary | ICD-10-CM | POA: Diagnosis not present

## 2023-09-08 DIAGNOSIS — J301 Allergic rhinitis due to pollen: Secondary | ICD-10-CM | POA: Diagnosis not present

## 2023-09-16 DIAGNOSIS — J3081 Allergic rhinitis due to animal (cat) (dog) hair and dander: Secondary | ICD-10-CM | POA: Diagnosis not present

## 2023-09-16 DIAGNOSIS — J3089 Other allergic rhinitis: Secondary | ICD-10-CM | POA: Diagnosis not present

## 2023-09-16 DIAGNOSIS — J301 Allergic rhinitis due to pollen: Secondary | ICD-10-CM | POA: Diagnosis not present

## 2023-09-17 IMAGING — MG MM DIGITAL SCREENING BILAT W/ TOMO AND CAD
6 of 10 series · 6 of 30 positions shown · non-contrast
Comparison: Previous exam(s).

CLINICAL DATA: Screening.

EXAM:
DIGITAL SCREENING BILATERAL MAMMOGRAM WITH TOMOSYNTHESIS AND CAD
TECHNIQUE: Bilateral screening digital craniocaudal and mediolateral oblique
mammograms were obtained. Bilateral screening digital breast
tomosynthesis was performed. The images were evaluated with
computer-aided detection.

[R MLO synth-2D (1 of 2)]
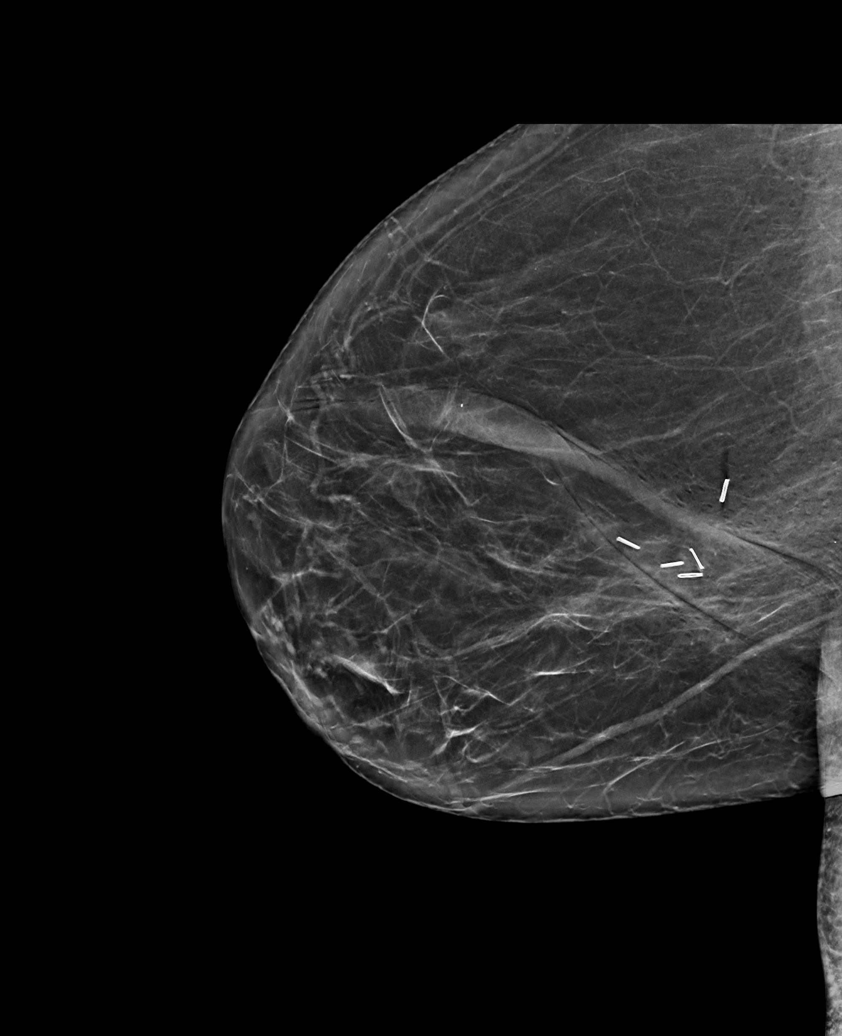

[L CC synth-2D]
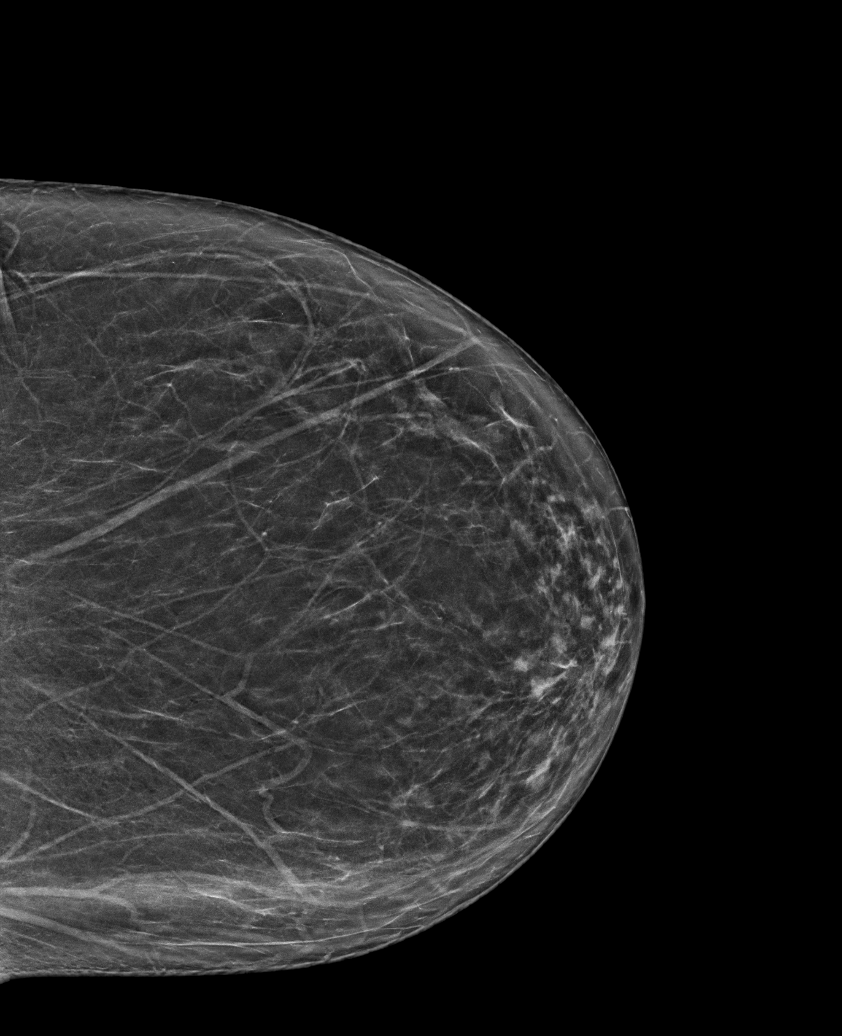

[R CC synth-2D]
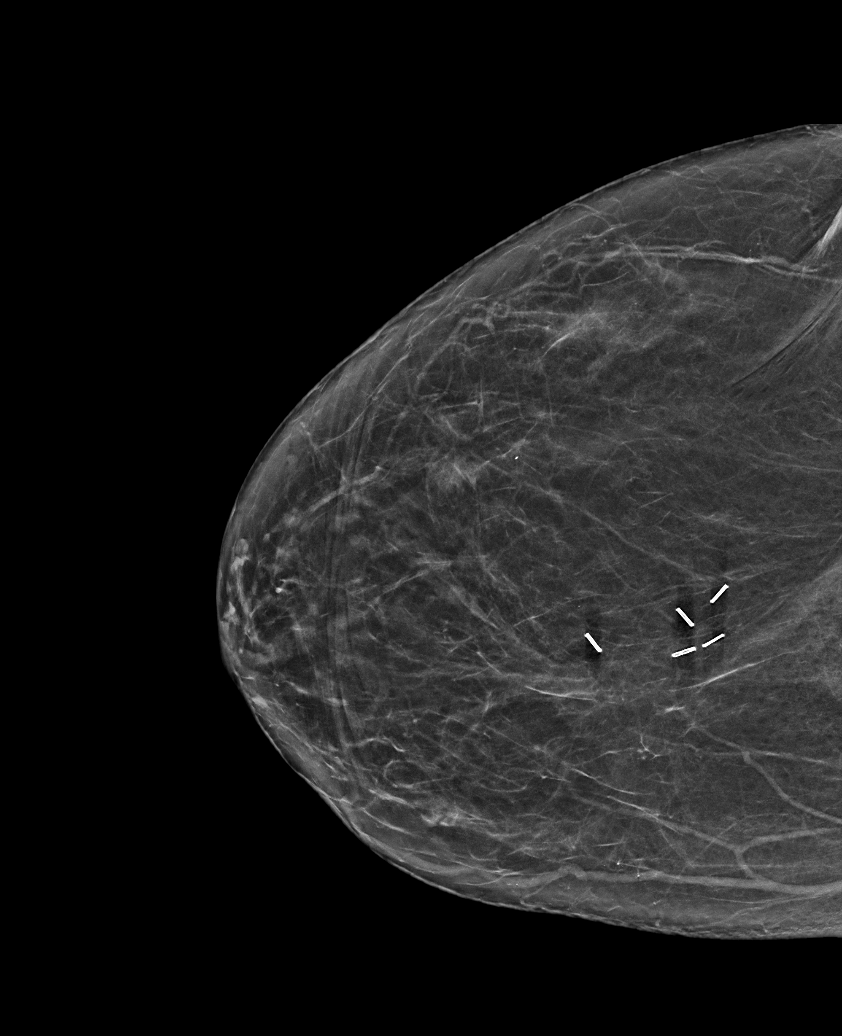

[R MLO synth-2D (2 of 2)]
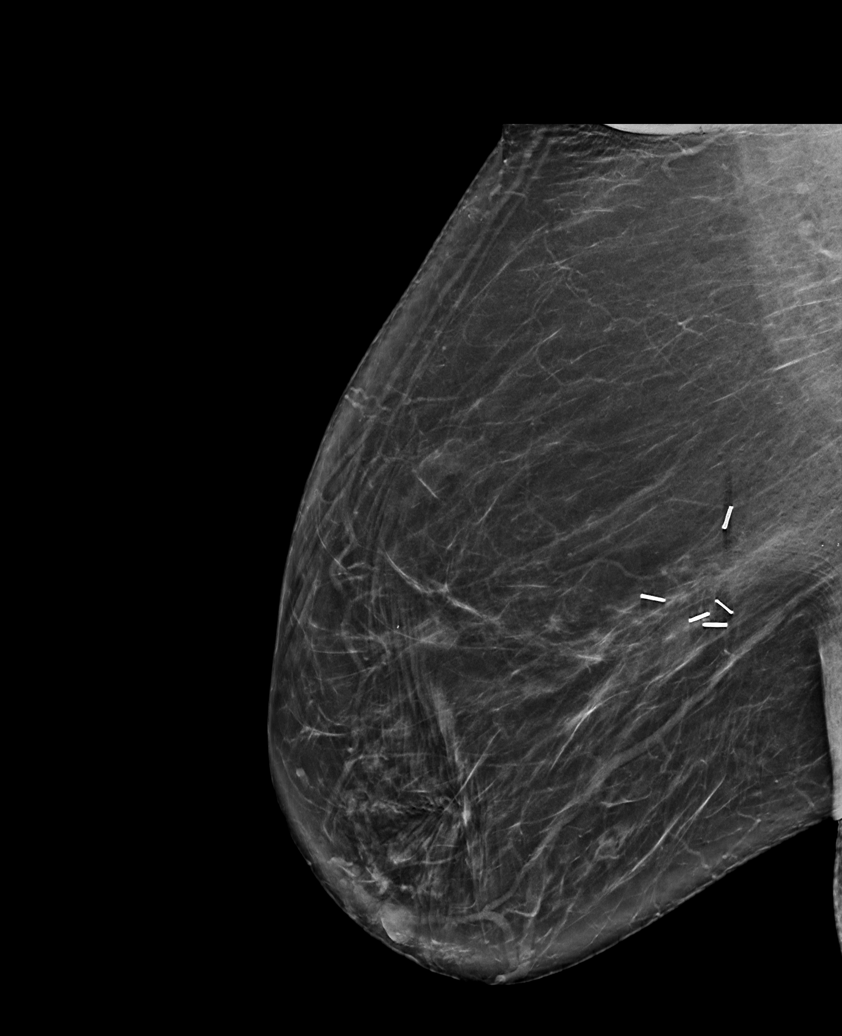

[L MLO synth-2D]
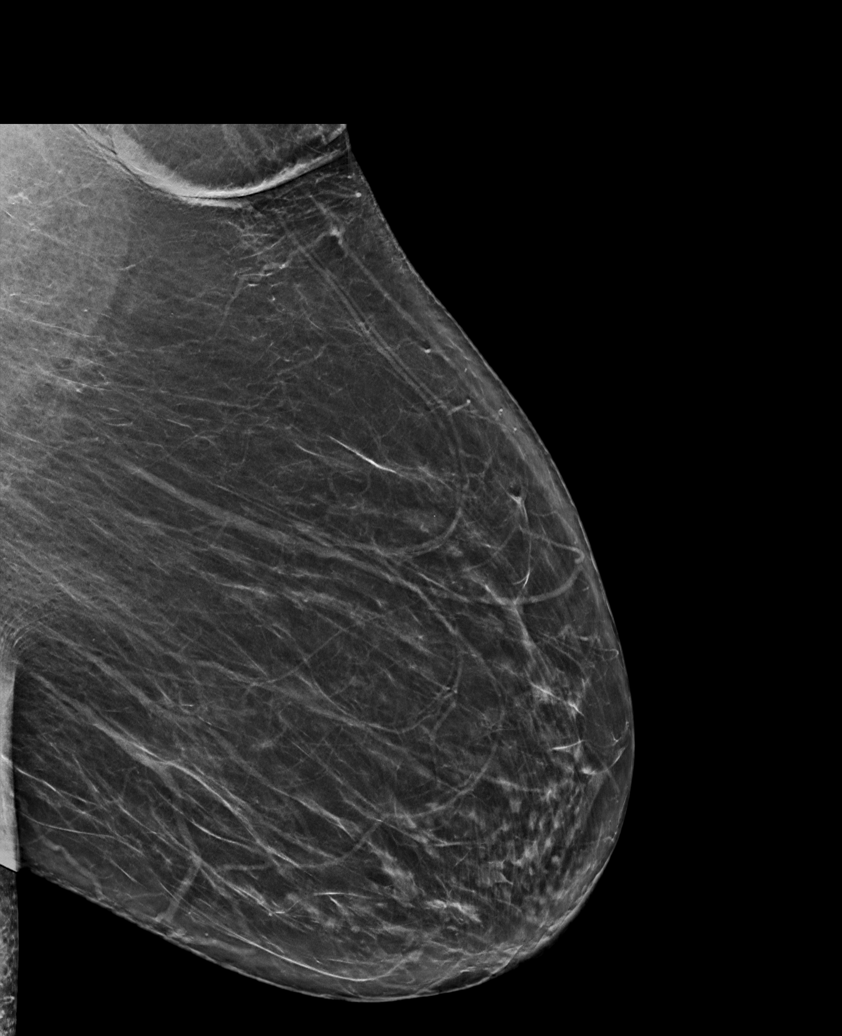

[R MLO tomo · tomo slice 35/70.0]
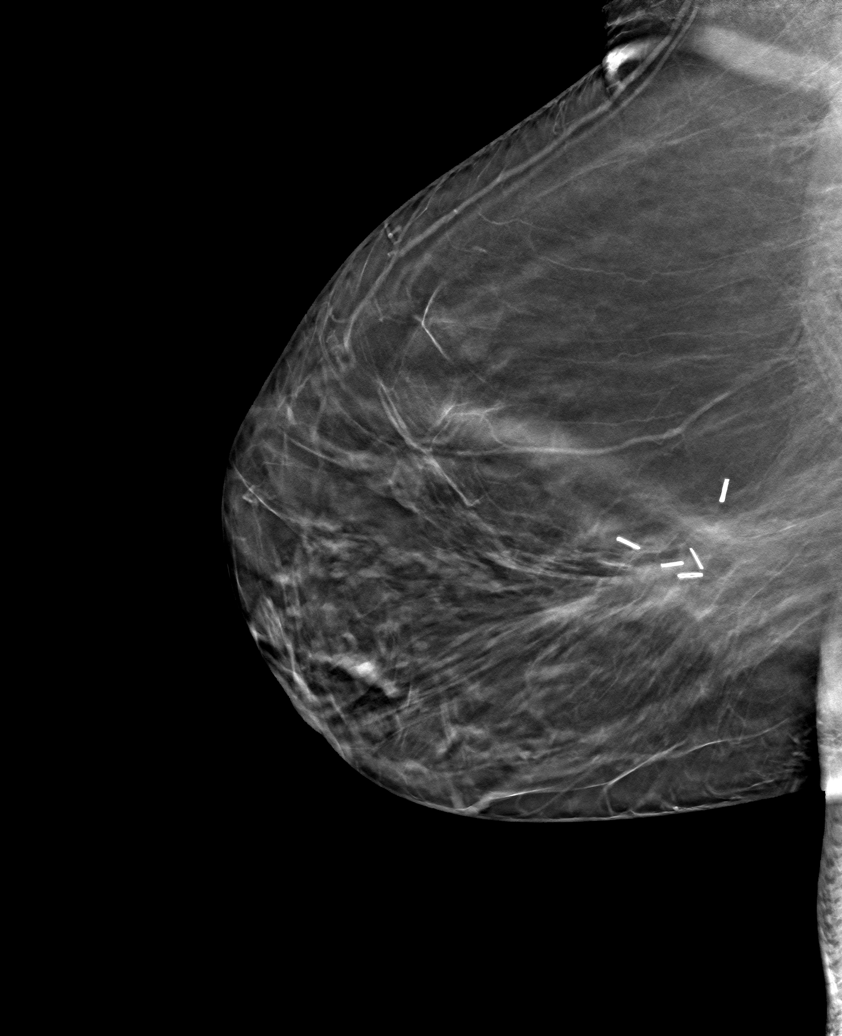

[6 of 30 positions shown; findings below may reference images not displayed]

ACR Breast Density Category b: There are scattered areas of
fibroglandular density.
FINDINGS: There are no findings suspicious for malignancy.
IMPRESSION: No mammographic evidence of malignancy. A result letter of this
screening mammogram will be mailed directly to the patient.

RECOMMENDATION:
Screening mammogram in one year. (Code:51-O-LD2)

BI-RADS CATEGORY  1: Negative.

## 2023-09-22 DIAGNOSIS — J301 Allergic rhinitis due to pollen: Secondary | ICD-10-CM | POA: Diagnosis not present

## 2023-09-22 DIAGNOSIS — J3089 Other allergic rhinitis: Secondary | ICD-10-CM | POA: Diagnosis not present

## 2023-09-22 DIAGNOSIS — J3081 Allergic rhinitis due to animal (cat) (dog) hair and dander: Secondary | ICD-10-CM | POA: Diagnosis not present

## 2023-09-30 DIAGNOSIS — J3089 Other allergic rhinitis: Secondary | ICD-10-CM | POA: Diagnosis not present

## 2023-09-30 DIAGNOSIS — J301 Allergic rhinitis due to pollen: Secondary | ICD-10-CM | POA: Diagnosis not present

## 2023-09-30 DIAGNOSIS — J3081 Allergic rhinitis due to animal (cat) (dog) hair and dander: Secondary | ICD-10-CM | POA: Diagnosis not present

## 2023-10-10 DIAGNOSIS — J3089 Other allergic rhinitis: Secondary | ICD-10-CM | POA: Diagnosis not present

## 2023-10-10 DIAGNOSIS — J301 Allergic rhinitis due to pollen: Secondary | ICD-10-CM | POA: Diagnosis not present

## 2023-10-10 DIAGNOSIS — J3081 Allergic rhinitis due to animal (cat) (dog) hair and dander: Secondary | ICD-10-CM | POA: Diagnosis not present

## 2023-10-14 ENCOUNTER — Other Ambulatory Visit: Payer: Self-pay | Admitting: Internal Medicine

## 2023-10-21 DIAGNOSIS — J301 Allergic rhinitis due to pollen: Secondary | ICD-10-CM | POA: Diagnosis not present

## 2023-10-21 DIAGNOSIS — J3089 Other allergic rhinitis: Secondary | ICD-10-CM | POA: Diagnosis not present

## 2023-10-21 DIAGNOSIS — J3081 Allergic rhinitis due to animal (cat) (dog) hair and dander: Secondary | ICD-10-CM | POA: Diagnosis not present

## 2023-10-27 DIAGNOSIS — J3081 Allergic rhinitis due to animal (cat) (dog) hair and dander: Secondary | ICD-10-CM | POA: Diagnosis not present

## 2023-10-27 DIAGNOSIS — J301 Allergic rhinitis due to pollen: Secondary | ICD-10-CM | POA: Diagnosis not present

## 2023-10-27 DIAGNOSIS — J3089 Other allergic rhinitis: Secondary | ICD-10-CM | POA: Diagnosis not present

## 2023-11-03 DIAGNOSIS — J3081 Allergic rhinitis due to animal (cat) (dog) hair and dander: Secondary | ICD-10-CM | POA: Diagnosis not present

## 2023-11-03 DIAGNOSIS — J301 Allergic rhinitis due to pollen: Secondary | ICD-10-CM | POA: Diagnosis not present

## 2023-11-03 DIAGNOSIS — J3089 Other allergic rhinitis: Secondary | ICD-10-CM | POA: Diagnosis not present

## 2023-11-11 DIAGNOSIS — J3089 Other allergic rhinitis: Secondary | ICD-10-CM | POA: Diagnosis not present

## 2023-11-11 DIAGNOSIS — J301 Allergic rhinitis due to pollen: Secondary | ICD-10-CM | POA: Diagnosis not present

## 2023-11-11 DIAGNOSIS — J3081 Allergic rhinitis due to animal (cat) (dog) hair and dander: Secondary | ICD-10-CM | POA: Diagnosis not present

## 2023-11-17 DIAGNOSIS — J301 Allergic rhinitis due to pollen: Secondary | ICD-10-CM | POA: Diagnosis not present

## 2023-11-17 DIAGNOSIS — J3089 Other allergic rhinitis: Secondary | ICD-10-CM | POA: Diagnosis not present

## 2023-11-17 DIAGNOSIS — J3081 Allergic rhinitis due to animal (cat) (dog) hair and dander: Secondary | ICD-10-CM | POA: Diagnosis not present

## 2023-11-23 ENCOUNTER — Ambulatory Visit: Admitting: Podiatry

## 2023-11-23 ENCOUNTER — Encounter: Payer: Self-pay | Admitting: Podiatry

## 2023-11-23 DIAGNOSIS — E1151 Type 2 diabetes mellitus with diabetic peripheral angiopathy without gangrene: Secondary | ICD-10-CM

## 2023-11-23 DIAGNOSIS — M79674 Pain in right toe(s): Secondary | ICD-10-CM

## 2023-11-23 DIAGNOSIS — J3081 Allergic rhinitis due to animal (cat) (dog) hair and dander: Secondary | ICD-10-CM | POA: Diagnosis not present

## 2023-11-23 DIAGNOSIS — M79675 Pain in left toe(s): Secondary | ICD-10-CM

## 2023-11-23 DIAGNOSIS — B351 Tinea unguium: Secondary | ICD-10-CM

## 2023-11-23 DIAGNOSIS — J301 Allergic rhinitis due to pollen: Secondary | ICD-10-CM | POA: Diagnosis not present

## 2023-11-23 DIAGNOSIS — J3089 Other allergic rhinitis: Secondary | ICD-10-CM | POA: Diagnosis not present

## 2023-11-24 ENCOUNTER — Other Ambulatory Visit: Payer: Self-pay | Admitting: Hematology

## 2023-11-24 DIAGNOSIS — Z1231 Encounter for screening mammogram for malignant neoplasm of breast: Secondary | ICD-10-CM

## 2023-11-24 NOTE — Progress Notes (Addendum)
 Subjective:  Patient ID: Darlene Mclaughlin, female    DOB: 10-Apr-1950,  MRN: [id number]  Darlene Mclaughlin presents to clinic today for at risk foot care. Pt has h/o NIDDM with PAD and painful thick toenails that are difficult to trim. Pain interferes with ambulation. Aggravating factors include wearing enclosed shoe gear. Pain is relieved with periodic professional debridement. She states her left great toe lateral border was sore and she trimmed it due to length. After trimming, it did provide some relief, but is still tender.  Chief Complaint  Patient presents with   Diabetes    DFC NIDDM A1C 6.8. Toenail trim . LOV with PCP 07/28/23    PCP is Theophilus Andrews, Tully GRADE, MD.  Allergies  Allergen Reactions   Atorvastatin      Hallucinations    Hydrochlorothiazide      Muscle pain   Hydrocodone     Swollen tongue   Macrobid  [Nitrofurantoin  Monohyd Macro]      Liver problems   Naproxen  Swelling    Mouth and tongue swelling   Simvastatin      Hallucinations, muscle pains   Zanaflex  [Tizanidine  Hcl] Other (See Comments)     Liver problems    Review of Systems: Negative except as noted in the HPI.  Objective:  There were no vitals filed for this visit. Darlene Mclaughlin is a pleasant 73 y.o. female in NAD. AAO x 3.  Vascular Examination: CFT <3 seconds b/l. Nonpalpable pulses RLE and nonpalpable PT LLE. Faintly palpable DP pulses LLE.  Digital hair absent. Skin temperature gradient warm to cool b/l. No ischemia or gangrene. No cyanosis or clubbing noted b/l. No edema noted b/l LE.   Neurological Examination: Sensation grossly intact b/l with 10 gram monofilament. Vibratory sensation intact b/l.   Dermatological Examination: Pedal skin thin, shiny and atrophic b/l. No open wounds. No interdigital macerations.   Toenails 1-5 b/l thick, discolored, elongated with subungual debris and pain on dorsal palpation.   Incurvated nailplate medial border of left second digit and lateral  border left hallux with tenderness to palpation. No erythema, no edema, no drainage noted. No fluctuance.  Musculoskeletal Examination: Muscle strength 5/5 to all lower extremity muscle groups bilaterally. HAV with bunion deformity noted b/l LE. Hammertoe(s) 2-5 b/l.  Radiographs: None  Assessment/Plan: 1. Pain due to onychomycosis of toenails of both feet   2. Type II diabetes mellitus with peripheral circulatory disorder [facility])   Consent given for treatment. Patient examined. All patient's and/or POA's questions/concerns addressed on today's visit. Toenails 1-5 debrided in length and girth without incident. -No invasive procedure(s) performed. Offending nail border debrided and curretaged medial border of left second digit and lateral border of left great toe utilizing sterile nail nipper and currette. Border(s) cleansed with alcohol and triple antibiotic ointment applied. Dispensed written instructions for once daily epsom salt soaks for 1 days. Patient informed about urgent care office hours of 5-7 pm Mon-Thurs. Call office if there are any concerns. Continue foot and shoe inspections daily. Monitor blood glucose per PCP/Endocrinologist's recommendations. Continue soft, supportive shoe gear daily. Report any pedal injuries to medical professional. Call office if there are any questions/concerns.  Return in about 3 months (around 02/23/2024).  Delon LITTIE Merlin, DPM      [place] LOCATION: [address]  [place], [place] [address]                   Office [phone]   [facility] LOCATION: [address] [place], [place] [address] Office [phone]

## 2023-12-01 DIAGNOSIS — J301 Allergic rhinitis due to pollen: Secondary | ICD-10-CM | POA: Diagnosis not present

## 2023-12-01 DIAGNOSIS — J3089 Other allergic rhinitis: Secondary | ICD-10-CM | POA: Diagnosis not present

## 2023-12-07 DIAGNOSIS — J301 Allergic rhinitis due to pollen: Secondary | ICD-10-CM | POA: Diagnosis not present

## 2023-12-07 DIAGNOSIS — J3089 Other allergic rhinitis: Secondary | ICD-10-CM | POA: Diagnosis not present

## 2023-12-07 DIAGNOSIS — J3081 Allergic rhinitis due to animal (cat) (dog) hair and dander: Secondary | ICD-10-CM | POA: Diagnosis not present

## 2023-12-13 ENCOUNTER — Other Ambulatory Visit: Payer: Self-pay

## 2023-12-13 DIAGNOSIS — J3081 Allergic rhinitis due to animal (cat) (dog) hair and dander: Secondary | ICD-10-CM | POA: Diagnosis not present

## 2023-12-13 DIAGNOSIS — J3089 Other allergic rhinitis: Secondary | ICD-10-CM | POA: Diagnosis not present

## 2023-12-13 DIAGNOSIS — D0511 Intraductal carcinoma in situ of right breast: Secondary | ICD-10-CM

## 2023-12-13 DIAGNOSIS — J301 Allergic rhinitis due to pollen: Secondary | ICD-10-CM | POA: Diagnosis not present

## 2023-12-13 NOTE — Assessment & Plan Note (Signed)
-  Diagnosed in 10/2014. S/p right breast lumpectomy. Adjuvant radiation not recommended due to lack of residual malignancy at time of definitive surgery -Given her ER/PR positive disease, she started on anastrozole  for chemoprevention in 03/2015 but stopped in July 2019 due to worsening arthralgia.  -continue cancer surveillance

## 2023-12-14 ENCOUNTER — Inpatient Hospital Stay: Payer: Medicare PPO | Admitting: Hematology

## 2023-12-14 ENCOUNTER — Inpatient Hospital Stay: Payer: Medicare PPO | Attending: Hematology

## 2023-12-14 VITALS — BP 112/78 | HR 88 | Temp 98.1°F | Resp 16 | Ht 65.0 in | Wt 233.3 lb

## 2023-12-14 DIAGNOSIS — N3941 Urge incontinence: Secondary | ICD-10-CM | POA: Diagnosis not present

## 2023-12-14 DIAGNOSIS — Z9011 Acquired absence of right breast and nipple: Secondary | ICD-10-CM | POA: Diagnosis not present

## 2023-12-14 DIAGNOSIS — D0511 Intraductal carcinoma in situ of right breast: Secondary | ICD-10-CM | POA: Diagnosis not present

## 2023-12-14 DIAGNOSIS — Z86000 Personal history of in-situ neoplasm of breast: Secondary | ICD-10-CM | POA: Diagnosis not present

## 2023-12-14 DIAGNOSIS — E559 Vitamin D deficiency, unspecified: Secondary | ICD-10-CM | POA: Insufficient documentation

## 2023-12-14 LAB — CBC WITH DIFFERENTIAL (CANCER CENTER ONLY)
Abs Immature Granulocytes: 0.02 K/uL (ref 0.00–0.07)
Basophils Absolute: 0.1 K/uL (ref 0.0–0.1)
Basophils Relative: 1 %
Eosinophils Absolute: 0.1 K/uL (ref 0.0–0.5)
Eosinophils Relative: 2 %
HCT: 39.4 % (ref 36.0–46.0)
Hemoglobin: 13 g/dL (ref 12.0–15.0)
Immature Granulocytes: 0 %
Lymphocytes Relative: 31 %
Lymphs Abs: 1.8 K/uL (ref 0.7–4.0)
MCH: 30.2 pg (ref 26.0–34.0)
MCHC: 33 g/dL (ref 30.0–36.0)
MCV: 91.6 fL (ref 80.0–100.0)
Monocytes Absolute: 0.4 K/uL (ref 0.1–1.0)
Monocytes Relative: 7 %
Neutro Abs: 3.4 K/uL (ref 1.7–7.7)
Neutrophils Relative %: 59 %
Platelet Count: 199 K/uL (ref 150–400)
RBC: 4.3 MIL/uL (ref 3.87–5.11)
RDW: 13.3 % (ref 11.5–15.5)
WBC Count: 5.8 K/uL (ref 4.0–10.5)
nRBC: 0 % (ref 0.0–0.2)

## 2023-12-14 LAB — CMP (CANCER CENTER ONLY)
ALT: 13 U/L (ref 0–44)
AST: 16 U/L (ref 15–41)
Albumin: 4.4 g/dL (ref 3.5–5.0)
Alkaline Phosphatase: 63 U/L (ref 38–126)
Anion gap: 8 (ref 5–15)
BUN: 18 mg/dL (ref 8–23)
CO2: 28 mmol/L (ref 22–32)
Calcium: 10.1 mg/dL (ref 8.9–10.3)
Chloride: 104 mmol/L (ref 98–111)
Creatinine: 0.75 mg/dL (ref 0.44–1.00)
GFR, Estimated: >60 mL/min (ref >60)
Glucose, Bld: 132 mg/dL — ABNORMAL HIGH (ref 70–99)
Potassium: 3.8 mmol/L (ref 3.5–5.1)
Sodium: 140 mmol/L (ref 135–145)
Total Bilirubin: 0.4 mg/dL (ref 0.0–1.2)
Total Protein: 8 g/dL (ref 6.5–8.1)

## 2023-12-14 LAB — VITAMIN D 25 HYDROXY (VIT D DEFICIENCY, FRACTURES): Vit D, 25-Hydroxy: 40.13 ng/mL (ref 30–100)

## 2023-12-14 NOTE — Progress Notes (Signed)
 [facility]   Telephone:([phone] Fax:([phone]) [phone]   Clinic Follow up Note   Patient Care Team: Theophilus Andrews, Tully GRADE, MD as PCP - General (Internal Medicine) Vanderbilt Ned, MD as Consulting Physician (General Surgery) Jason Charleston, MD (Inactive) as Consulting Physician (Radiation Oncology) Lanny Callander, MD as Consulting Physician (Hematology) Moses Powell Hummer, NP as Nurse Practitioner (Hematology and Oncology)  Date of Service:  12/14/2023  CHIEF COMPLAINT: f/u of DCIS  Oncology History   Ductal carcinoma in situ (DCIS) of right breast -Diagnosed in 10/2014. S/p right breast lumpectomy. Adjuvant radiation not recommended due to lack of residual malignancy at time of definitive surgery -Given her ER/PR positive disease, she started on anastrozole  for chemoprevention in 03/2015 but stopped in July 2019 due to worsening arthralgia.  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 No current breast pain or discomfort. Right breast surgery site is well-healed. Increased risk for second breast cancer. - Ensure annual mammogram in November - Discuss breast exam during annual checkup with family doctor - Encourage self-breast exams  Lower extremity swelling Swelling after minor fall on grass, likely due to fall with no severe injury indicated. - Advise elevating feet at home to reduce swelling  Urinary incontinence Reports urinary urgency and incontinence. Potential causes include age-related changes, pelvic floor weakness, and previous pregnancies. - Discuss urinary symptoms with family doctor - Consider referral to urologist or gynecologist if symptoms persist - Consider pelvic floor physical therapy if recommended by primary care  Plan - Lab reviewed, exam was unremarkable, no clinical concern for breast cancer. - She is scheduled for annual mammogram next  months - She will follow-up with her PCP, I will see her as needed in future.   SUMMARY OF ONCOLOGIC HISTORY: Oncology History Overview Note  Breast cancer of lower-outer quadrant of right female breast [facility])   Staging form: Breast, AJCC 7th Edition     Clinical stage from 11/15/2014: Stage 0 (Tis (DCIS), N0, M0) - Signed by Callander Lanny, MD on 11/26/2014     Ductal carcinoma in situ (DCIS) of right breast  11/06/2014 Mammogram   Screening mammogram showed calcifications in the lower outer quadrant of the right breast, which was confirmed by diagnostic mammogram   11/15/2014 Initial Biopsy   Breast core needle biopsy showed ductal carcinoma in situ with calcifications.   11/15/2014 Receptors her2   ER+ (100%); PR+ (90%)   11/15/2014 Clinical Stage   Stage 0: Tis N0   12/17/2014 Surgery   Right breast lumpectomy.   12/17/2014 Pathology Results   Right breast lumpectomy showed usual ductal Hyperplasia and fibroadenoma. No residual ductal carcinoma in situ.     Radiation Therapy   Not recommended due to lack of residual malignancy at time of definitive surgery   03/28/2015 - 08/2017 Anti-estrogen oral therapy   Anastrozole  1 mg daily begun 03/28/2015 and stopped 08/2017 due to poor tolerance.    05/01/2015 Survivorship   Survivorship visit completed and copy of care plan provided to patient   11/06/2015 Imaging   MM DIAG BREAST TOMO BILATERAL 11/06/15 IMPRESSION: No mammographic evidence of malignancy. BI-RADS CATEGORY  2: Benign.   08/04/2016 Pathology Results   Diagnosis  Surgical [P], cecum, polyp - TUBULAR ADENOMA. - NO HIGH GRADE DYSPLASIA OR MALIGNANCY.   08/04/2016 Procedure   Colonoscopy A 2 mm polyp was found in the cecum. The polyp was sessile. The polyp was removed with a cold  snare. Resection and retrieval were complete. Findings: - The exam was otherwise without abnormality on direct and retroflexion views.   11/11/2016 Mammogram   IMPRESSION: 1. No mammographic  evidence of malignancy. 2. Stable right breast postsurgical changes.   01/05/2022 Mammogram   3D Screening Mammogram bilateral IMPRESSION: No mammographic evidence of malignancy. A result letter of this screening mammogram will be mailed directly to the patient.  RECOMMENDATION: Screening mammogram in one year. (Code:[id number]-01Y)  BI-RADS CATEGORY  2: Benign.        Discussed the use of AI scribe software for clinical note transcription with the patient, who gave verbal consent to proceed.  History of Present Illness Darlene Mclaughlin is a 73 year old female who presents for follow-up.  She has a history of ductal carcinoma in situ (DCIS) in the right breast, treated surgically. There are no new concerns related to her breast health, and she experiences no pain or discomfort in the breasts. She is scheduled for a mammogram in November.     All other systems were reviewed with the patient and are negative.  MEDICAL HISTORY:  Past Medical History:  Diagnosis Date   ALLERGIC RHINITIS 05/15/2008   Allergy    Anemia    ASTHMA 05/15/2008   Asthma    Blood transfusion without reported diagnosis 1991   anemia   Breast cancer (HCC) 2016   right breast   Cancer (HCC)    DCIS right breast   Diabetes mellitus without complication (HCC)    GERD (gastroesophageal reflux disease)    Headache(784.0) 05/15/2008   Hyperlipidemia    HYPERTENSION 05/15/2008   IMPAIRED GLUCOSE TOLERANCE 05/15/2008   Obesity    OSTEOARTHRITIS 05/15/2008   back    SURGICAL HISTORY: Past Surgical History:  Procedure Laterality Date   ABDOMINAL HYSTERECTOMY     ANTERIOR LAT LUMBAR FUSION Left 05/21/2020   Procedure: LUMBAR TWO-THREE, LUMBAR THREE-FOUR DIRECT LATERAL LUMBAR INTERBODY FUSION;  Surgeon: Debby Dorn MATSU, MD;  Location: [facility] OR;  Service: Neurosurgery;  Laterality: Left;   APPLICATION OF ROBOTIC ASSISTANCE FOR SPINAL PROCEDURE N/A 05/21/2020   Procedure: APPLICATION OF ROBOTIC ASSISTANCE  FOR SPINAL PROCEDURE;  Surgeon: Debby Dorn MATSU, MD;  Location[facility];  Service: Neurosurgery;  Laterality: N/A;   BREAST LUMPECTOMY Right 12/17/2014   BREAST LUMPECTOMY WITH RADIOACTIVE SEED LOCALIZATION Right 12/17/2014   Procedure: RIGHT BREAST RADIOACTIVE SEED GUIDED PARTIAL MASTECTOMY;  Surgeon: Debby Shipper, MD;  Location: [facility];  Service: General;  Laterality: Right;   CESAREAN SECTION     x2   COLONOSCOPY     KNEE SURGERY     arthroscopic left   TRANSFORAMINAL LUMBAR INTERBODY FUSION W/ MIS 2 LEVEL Right 05/21/2020   Procedure: LUMBAR FOUR-FIVE, LUMBAR FIVE-SACRAL ONE MINIMALLY INVASIVE TRANSFORAMINAL LUMBAR INTERBODY FUSION WITH INSTRUMENTATION LUMBAR TWO-SACRAL ONE;  Surgeon: Debby Dorn MATSU, MD;  Location: [facility] OR;  Service: Neurosurgery;  Laterality: Right;    I have reviewed the social history and family history with the patient and they are unchanged from previous note.  ALLERGIES:  is allergic to atorvastatin , hydrochlorothiazide , hydrocodone, macrobid  [nitrofurantoin  monohyd macro], naproxen , simvastatin , and zanaflex  [tizanidine  hcl].  MEDICATIONS:  Current Outpatient Medications  Medication Sig Dispense Refill   Accu-Chek FastClix Lancets MISC 1 EACH BY DOES NOT APPLY ROUTE DAILY. USE FOR ACCU-CHEK GUIDE 102 each 3   ACCU-CHEK GUIDE test strip USE AS DIRECTED 100 strip 3   acetaminophen  (TYLENOL ) 650 MG CR tablet Take 650 mg by mouth every 8 (  eight) hours as needed for pain.     aspirin 81 MG tablet Take 81 mg by mouth daily.     azelastine (OPTIVAR) 0.05 % ophthalmic solution Place 1 drop into both eyes daily as needed (allergies).     Blood Glucose Monitoring Suppl (ACCU-CHEK GUIDE ME) w/Device KIT USE TO CHECK BLOOD SUGAR ONCE DAILY 1 kit 1   CALCIUM  PO Take 1,000 mg by mouth daily.     cetirizine (ZYRTEC) 10 MG tablet Take 10 mg by mouth daily as needed for allergies.     chlorthalidone  (HYGROTON ) 25 MG tablet TAKE 1 TABLET BY MOUTH EVERY  DAY 90 tablet 1   cyclobenzaprine  (FLEXERIL ) 10 MG tablet Take 1 tablet (10 mg total) by mouth 3 (three) times daily as needed for muscle spasms. 60 tablet 0   Docusate Sodium  (DSS) 100 MG CAPS Take 1 capsule by mouth 2 (two) times daily.     EPIPEN  2-PAK 0.3 MG/0.3ML SOAJ injection Inject 0.3 mg into the muscle as needed for anaphylaxis.  1   fluticasone  (FLONASE ) 50 MCG/ACT nasal spray Place 1 spray into both nostrils daily. (Patient taking differently: Place 1 spray into both nostrils daily as needed for allergies.) 16 g 5   KLOR-CON  M20 20 MEQ tablet TAKE 1 TABLET BY MOUTH TWICE A DAY 180 tablet 1   Melatonin 10 MG TABS Take 10 mg by mouth at bedtime as needed (sleep).     metFORMIN  (GLUCOPHAGE -XR) 500 MG 24 hr tablet Take 2 tablets (1,000 mg total) by mouth daily with supper. 180 tablet 1   Multiple Vitamin (MULTIVITAMIN) tablet Take 1 tablet by mouth daily.     neomycin -polymyxin-hydrocortisone (CORTISPORIN) 3.5-10000-1 OTIC suspension APPLY 1-2 DROPS DAILY AFTER SOAKING AND COVER WITH BANDAID 10 mL 0   NON FORMULARY EVERY OTHER WEEK ALLERGY INJECTIONS     PROAIR HFA 108 (90 BASE) MCG/ACT inhaler Inhale 1 puff into the lungs every 6 (six) hours as needed for shortness of breath or wheezing.  0   Simethicone  (GAS-X PO) Take 1 tablet by mouth daily as needed (gas).     traMADol  (ULTRAM ) 50 MG tablet Take 1 tablet by mouth every 6 (six) hours as needed.     No current facility-administered medications for this visit.    PHYSICAL EXAMINATION: ECOG PERFORMANCE STATUS: 2 - Symptomatic, <50% confined to bed  Vitals:   12/14/23 1046  BP: 112/78  Pulse: 88  Resp: 16  Temp: 98.1 F (36.7 C)  SpO2: 99%   Wt Readings from Last 3 Encounters:  12/14/23 233 lb 4.8 oz (105.8 kg)  08/09/23 235 lb 8 oz (106.8 kg)  07/28/23 235 lb 8 oz (106.8 kg)     GENERAL:alert, no distress and comfortable SKIN: skin color, texture, turgor are normal, no rashes or significant lesions EYES: normal,  Conjunctiva are pink and non-injected, sclera clear NECK: supple, thyroid  normal size, non-tender, without nodularity LYMPH:  no palpable lymphadenopathy in the cervical, axillary  LUNGS: clear to auscultation and percussion with normal breathing effort HEART: regular rate & rhythm and no murmurs, (+) mild b/l lower extremity edema ABDOMEN:abdomen soft, non-tender and normal bowel sounds Musculoskeletal:no cyanosis of digits and no clubbing  NEURO: alert & oriented x 3 with fluent speech, no focal motor/sensory deficits BREAST: Right breast soft, no pain or discomfort.  No palpable mass in breast or axillary adenopathy, right breast surgery site healed well. EXTREMITIES: Feet slightly swollen. Physical Exam   LABORATORY DATA:  I have reviewed the data as  listed    Latest Ref Rng & Units 12/14/2023    9:48 AM 12/08/2022    8:35 AM 04/06/2022    8:44 AM  CBC  WBC 4.0 - 10.5 K/uL 5.8  5.8  6.2   Hemoglobin 12.0 - 15.0 g/dL 86.9  87.0  87.0   Hematocrit 36.0 - 46.0 % 39.4  38.5  39.2   Platelets 150 - 400 K/uL 199  200  189.0         Latest Ref Rng & Units 12/14/2023    9:48 AM 12/08/2022    8:35 AM 04/06/2022    8:44 AM  CMP  Glucose 70 - 99 mg/dL 867  825  834   BUN 8 - 23 mg/dL 18  16  14    Creatinine 0.44 - 1.00 mg/dL 9.24  9.38  9.38   Sodium 135 - 145 mmol/L 140  139  138   Potassium 3.5 - 5.1 mmol/L 3.8  3.2  3.5   Chloride 98 - 111 mmol/L 104  101  99   CO2 22 - 32 mmol/L 28  26  28    Calcium  8.9 - 10.3 mg/dL 89.8  9.6  9.7   Total Protein 6.5 - 8.1 g/dL 8.0  7.7  7.6   Total Bilirubin 0.0 - 1.2 mg/dL 0.4  0.7  0.4   Alkaline Phos 38 - 126 U/L 63  68  76   AST 15 - 41 U/L 16  18  15    ALT 0 - 44 U/L 13  17  13        RADIOGRAPHIC STUDIES: I have personally reviewed the radiological images as listed and agreed with the findings in the report. No results found.    No orders of the defined types were placed in this encounter.  All questions were answered. The  patient knows to call the clinic with any problems, questions or concerns. No barriers to learning was detected. The total time spent in the appointment was 20 minutes, including review of chart and various tests results, discussions about plan of care and coordination of care plan     Onita Mattock, MD 12/14/2023

## 2023-12-16 DIAGNOSIS — J3081 Allergic rhinitis due to animal (cat) (dog) hair and dander: Secondary | ICD-10-CM | POA: Diagnosis not present

## 2023-12-16 DIAGNOSIS — J301 Allergic rhinitis due to pollen: Secondary | ICD-10-CM | POA: Diagnosis not present

## 2023-12-16 DIAGNOSIS — J3089 Other allergic rhinitis: Secondary | ICD-10-CM | POA: Diagnosis not present

## 2023-12-21 DIAGNOSIS — J3081 Allergic rhinitis due to animal (cat) (dog) hair and dander: Secondary | ICD-10-CM | POA: Diagnosis not present

## 2023-12-21 DIAGNOSIS — J301 Allergic rhinitis due to pollen: Secondary | ICD-10-CM | POA: Diagnosis not present

## 2023-12-21 DIAGNOSIS — J3089 Other allergic rhinitis: Secondary | ICD-10-CM | POA: Diagnosis not present

## 2023-12-30 DIAGNOSIS — J3089 Other allergic rhinitis: Secondary | ICD-10-CM | POA: Diagnosis not present

## 2023-12-30 DIAGNOSIS — J3081 Allergic rhinitis due to animal (cat) (dog) hair and dander: Secondary | ICD-10-CM | POA: Diagnosis not present

## 2023-12-30 DIAGNOSIS — J301 Allergic rhinitis due to pollen: Secondary | ICD-10-CM | POA: Diagnosis not present

## 2024-01-05 DIAGNOSIS — J3089 Other allergic rhinitis: Secondary | ICD-10-CM | POA: Diagnosis not present

## 2024-01-05 DIAGNOSIS — J301 Allergic rhinitis due to pollen: Secondary | ICD-10-CM | POA: Diagnosis not present

## 2024-01-09 ENCOUNTER — Ambulatory Visit
Admission: RE | Admit: 2024-01-09 | Discharge: 2024-01-09 | Disposition: A | Discharge status: Home / Self Care | Source: Ambulatory Visit | Attending: Hematology | Admitting: Hematology

## 2024-01-09 DIAGNOSIS — Z1231 Encounter for screening mammogram for malignant neoplasm of breast: Secondary | ICD-10-CM

## 2024-01-13 DIAGNOSIS — J3089 Other allergic rhinitis: Secondary | ICD-10-CM | POA: Diagnosis not present

## 2024-01-13 DIAGNOSIS — J301 Allergic rhinitis due to pollen: Secondary | ICD-10-CM | POA: Diagnosis not present

## 2024-01-13 DIAGNOSIS — J3081 Allergic rhinitis due to animal (cat) (dog) hair and dander: Secondary | ICD-10-CM | POA: Diagnosis not present

## 2024-01-16 DIAGNOSIS — Z6838 Body mass index (BMI) 38.0-38.9, adult: Secondary | ICD-10-CM | POA: Diagnosis not present

## 2024-01-16 DIAGNOSIS — M5412 Radiculopathy, cervical region: Secondary | ICD-10-CM | POA: Diagnosis not present

## 2024-01-17 DIAGNOSIS — J3089 Other allergic rhinitis: Secondary | ICD-10-CM | POA: Diagnosis not present

## 2024-01-17 DIAGNOSIS — J3081 Allergic rhinitis due to animal (cat) (dog) hair and dander: Secondary | ICD-10-CM | POA: Diagnosis not present

## 2024-01-17 DIAGNOSIS — J301 Allergic rhinitis due to pollen: Secondary | ICD-10-CM | POA: Diagnosis not present

## 2024-01-23 ENCOUNTER — Other Ambulatory Visit: Payer: Self-pay | Admitting: Internal Medicine

## 2024-01-25 DIAGNOSIS — J3081 Allergic rhinitis due to animal (cat) (dog) hair and dander: Secondary | ICD-10-CM | POA: Diagnosis not present

## 2024-01-25 DIAGNOSIS — J3089 Other allergic rhinitis: Secondary | ICD-10-CM | POA: Diagnosis not present

## 2024-01-25 DIAGNOSIS — J301 Allergic rhinitis due to pollen: Secondary | ICD-10-CM | POA: Diagnosis not present

## 2024-02-21 ENCOUNTER — Ambulatory Visit: Admitting: Physical Therapy

## 2024-02-29 ENCOUNTER — Ambulatory Visit: Attending: Internal Medicine | Admitting: Physical Therapy

## 2024-02-29 ENCOUNTER — Encounter: Payer: Self-pay | Admitting: Physical Therapy

## 2024-02-29 ENCOUNTER — Other Ambulatory Visit: Payer: Self-pay | Admitting: Internal Medicine

## 2024-02-29 ENCOUNTER — Other Ambulatory Visit: Payer: Self-pay

## 2024-02-29 VITALS — BP 131/57 | HR 99

## 2024-02-29 DIAGNOSIS — R2689 Other abnormalities of gait and mobility: Secondary | ICD-10-CM | POA: Diagnosis present

## 2024-02-29 DIAGNOSIS — M5412 Radiculopathy, cervical region: Secondary | ICD-10-CM | POA: Diagnosis present

## 2024-02-29 DIAGNOSIS — M542 Cervicalgia: Secondary | ICD-10-CM | POA: Diagnosis present

## 2024-02-29 DIAGNOSIS — E119 Type 2 diabetes mellitus without complications: Secondary | ICD-10-CM

## 2024-02-29 DIAGNOSIS — R2681 Unsteadiness on feet: Secondary | ICD-10-CM | POA: Diagnosis present

## 2024-02-29 DIAGNOSIS — M6281 Muscle weakness (generalized): Secondary | ICD-10-CM | POA: Insufficient documentation

## 2024-02-29 NOTE — Therapy (Signed)
 " OUTPATIENT PHYSICAL THERAPY NEURO EVALUATION   Patient Name: Darlene Mclaughlin MRN: [id number] DOB:12/22/1950, 74 y.o., female Today's Date: 02/29/2024   PCP: Theophilus Andrews, Tully GRADE, MD REFERRING PROVIDER: Darnella Dorn SAUNDERS, MD  END OF SESSION:  PT End of Session - 02/29/24 1138     Visit Number 1    Number of Visits 9   8 + eval   Authorization Type HUMANA    PT Start Time 1130    PT Stop Time 1220    PT Time Calculation (min) 50 min    Equipment Utilized During Treatment Gait belt    Activity Tolerance Patient tolerated treatment well    Behavior During Therapy WFL for tasks assessed/performed          Past Medical History:  Diagnosis Date   ALLERGIC RHINITIS 05/15/2008   Allergy    Anemia    ASTHMA 05/15/2008   Asthma    Blood transfusion without reported diagnosis 1991   anemia   Breast cancer (HCC) 2016   right breast   Cancer (HCC)    DCIS right breast   Diabetes mellitus without complication (HCC)    GERD (gastroesophageal reflux disease)    Headache(784.0) 05/15/2008   Hyperlipidemia    HYPERTENSION 05/15/2008   IMPAIRED GLUCOSE TOLERANCE 05/15/2008   Obesity    OSTEOARTHRITIS 05/15/2008   back   Past Surgical History:  Procedure Laterality Date   ABDOMINAL HYSTERECTOMY     ANTERIOR LAT LUMBAR FUSION Left 05/21/2020   Procedure: LUMBAR TWO-THREE, LUMBAR THREE-FOUR DIRECT LATERAL LUMBAR INTERBODY FUSION;  Surgeon: Debby Dorn MATSU, MD;  Location: [facility] OR;  Service: Neurosurgery;  Laterality: Left;   APPLICATION OF ROBOTIC ASSISTANCE FOR SPINAL PROCEDURE N/A 05/21/2020   Procedure: APPLICATION OF ROBOTIC ASSISTANCE FOR SPINAL PROCEDURE;  Surgeon: Debby Dorn MATSU, MD;  Location: [facility] OR;  Service: Neurosurgery;  Laterality: N/A;   BREAST LUMPECTOMY Right 12/17/2014   BREAST LUMPECTOMY WITH RADIOACTIVE SEED LOCALIZATION Right 12/17/2014   Procedure: RIGHT BREAST RADIOACTIVE SEED GUIDED PARTIAL MASTECTOMY;  Surgeon: Debby Shipper, MD;  Location: [facility];  Service: General;  Laterality: Right;   CESAREAN SECTION     x2   COLONOSCOPY     KNEE SURGERY     arthroscopic left   TRANSFORAMINAL LUMBAR INTERBODY FUSION W/ MIS 2 LEVEL Right 05/21/2020   Procedure: LUMBAR FOUR-FIVE, LUMBAR FIVE-SACRAL ONE MINIMALLY INVASIVE TRANSFORAMINAL LUMBAR INTERBODY FUSION WITH INSTRUMENTATION LUMBAR TWO-SACRAL ONE;  Surgeon: Debby Dorn MATSU, MD;  Location: [facility] OR;  Service: Neurosurgery;  Laterality: Right;   Patient Active Problem List   Diagnosis Date Noted   Closed fracture of lateral malleolus of right fibula 02/20/2021   Lumbar radiculopathy 05/21/2020   Statin intolerance 01/16/2018   Bilateral leg edema 12/21/2017   Bilateral lower extremity edema 12/21/2017   History of colonic polyps 10/18/2017   Dyslipidemia 10/18/2017   History of colon polyps 10/18/2017   Essential hypertension 03/26/2016   Family history of colon cancer 03/26/2016   Osteopenia 08/28/2015   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   Spinal stenosis of lumbar region 12/05/2013   Morbid obesity (HCC) 12/19/2012   Diabetes mellitus with coincident hypertension (HCC) 05/15/2008   Allergic rhinitis 05/15/2008   Asthma, mild intermittent 05/15/2008   Osteoarthritis 05/15/2008   HEADACHE 05/15/2008    ONSET DATE: 2 months  REFERRING DIAG: M54.12 (ICD-10-CM) - Radiculopathy, cervical region  THERAPY DIAG:  Radiculopathy, cervical region  Other abnormalities of gait and mobility  Muscle weakness (generalized)  Unsteadiness on feet  Cervicalgia  Rationale for Evaluation and Treatment: Rehabilitation  SUBJECTIVE:                                                                                                                                                                                             SUBJECTIVE STATEMENT: Pt reports that her neck does not bother her much at rest when sitting, but if she is laying down it has  impacted her sleep quite a bit.  She reports soreness and stiffness down into both shoulders and hands.  She has been dropping things and having difficulty grabbing objects despite efforts to flex hands throughout the day.  She reports some thigh spasms and imbalance.  She was going to the [facility] previously up until it got cold.  She was doing water aerobics and plans to return during early spring.  She ambulates w/ a SPC. Pt accompanied by: self (she drives herself)  PERTINENT HISTORY: DM2, asthma, OA, headaches, lumbar stenosis, R lumbar interbody fusion L4-5 and L5-S1 as well as left anterior lateral lumbar fusion L2-3 and L3-4 2022  PAIN:  Are you having pain? Yes: NPRS scale: 6 Pain location: neck into hands, thighs, low back Pain description: ache Aggravating factors: cold weather Relieving factors: heat  PRECAUTIONS: Fall  RED FLAGS: Bowel or bladder incontinence: Yes: urge incontinence - pt states she has discussed w/ PCP   WEIGHT BEARING RESTRICTIONS: No  FALLS: Has patient fallen in last 6 months? Yes. Number of falls 1 - unsure if LE gave out or if she slipped on grass  LIVING ENVIRONMENT: Lives with: lives alone Lives in: House/apartment Stairs: No Has following equipment at home: Single point cane and Shower bench  PLOF: Independent and Requires assistive device for independence - prior to new symptoms she could sometimes go without her cane, but in recent months she relies on it more consistently  PATIENT GOALS: I would love to be able to just go and come and walk freely without my cane  OBJECTIVE:  Note: Objective measures were completed at Evaluation unless otherwise noted.  DIAGNOSTIC FINDINGS: no recent relevant imaging  COGNITION: Overall cognitive status: Within functional limits for tasks assessed   SENSATION: Light touch: Impaired  - unable to identify correct locations from mid-calf to toes (R seems worse than left) - pt confirms more tingling in her  RLE  COORDINATION: LE RAMS:  impaired (R dec DF)  EDEMA:  1+ edema from 3 inches above lateral malleoli bilaterally to dorsum of foot - pt reports this is chronic and R swells more than L  MUSCLE  TONE: None noted in BLE  POSTURE: rounded shoulders, forward head, posterior pelvic tilt, and weight shift right  LOWER EXTREMITY ROM:     Active  Right Eval Left Eval  Hip flexion Grossly WFL Grossly WFL  Hip extension    Hip abduction    Hip adduction    Hip internal rotation    Hip external rotation    Knee flexion    Knee extension    Ankle dorsiflexion 2-3 degrees   Ankle plantarflexion    Ankle inversion    Ankle eversion     (Blank rows = not tested)  LOWER EXTREMITY MMT:    MMT Right Eval Left Eval  Hip flexion 3+ 3+  Hip extension    Hip abduction    Hip adduction    Hip internal rotation    Hip external rotation    Knee flexion 4+ 4+  Knee extension 4+ 4+  Ankle dorsiflexion 2+ 4  Ankle plantarflexion    Ankle inversion    Ankle eversion    (Blank rows = not tested)  BED MOBILITY:  Findings: Sit to supine Complete Independence Supine to sit Complete Independence Rolling to Right Complete Independence Rolling to Left Complete Independence  TRANSFERS: Sit to stand: SBA  Assistive device utilized: Single point cane     Stand to sit: SBA  Assistive device utilized: Single point cane     Chair to chair: SBA  Assistive device utilized: Single point cane       RAMP:  Not tested  CURB:  Not tested  STAIRS: Not tested GAIT: Findings: Gait Characteristics: step to pattern, step through pattern, decreased step length- Right, decreased step length- Left, decreased stride length, decreased ankle dorsiflexion- Right, shuffling, decreased trunk rotation, trunk flexed, and wide BOS, Distance walked: various clinic distances, Assistive device utilized:Single point cane, Level of assistance: SBA, and Comments: no toe drag or foot slap, but pt maintains some  bilateral ER during steps and slow pace  FUNCTIONAL TESTS:  5 times sit to stand: 23.25 sec w/ BUE support Timed up and go (TUG): 25.38 sec w/ SPC SBA 10 meter walk test: TBA Berg Balance Scale:  TBA  PATIENT SURVEYS:  NDI:  NECK DISABILITY INDEX  Date: 02/29/2024 Score  Pain intensity 0 = I have no pain at the moment  2. Personal care (washing, dressing, etc.) 1 =  I can look after myself normally but it causes extra pain  3. Lifting 3 = Pain prevents me from lifting heavy weights but I can manage light to medium   weights if they are conveniently positioned  4. Reading 0 = I can read as much as I want to with no pain in my neck  5. Headaches 2 =  I have moderate headaches, which come infrequently  6. Concentration 2 = I have a fair degree of difficulty in concentrating when I want to  7. Work 1 =  I can only do my usual work, but no more  8. Driving 2 =  I can drive my car as long as I want with moderate pain in my neck  9. Sleeping 3 =  My sleep is moderately disturbed (2-3 hrs sleepless)  10. Recreation 3 = I am able to engage in a few of my usual recreation activities because of pain in   my neck  Total 17/50   Minimum Detectable Change (90% confidence): 5 points or 10% points  TREATMENT DATE: 02/29/2024    PATIENT EDUCATION: Education details: PT POC, assessments used and to be used, and goals to be set.  OT referral for bil hand ache, tightness, and impaired grasp and object manipulation (has impacted her cooking and hobbies greatly).  Discussed aquatic therapy in heated pool during POC, pt wanting to focus on land only as she plans to return to [employer] in spring.  Not rushing to restroom/using timed breaks to decrease risk of falling going to restroom until pt can discuss urgency with PCP again.  Ask PCP if they have any concerns for her using  compression stockings for BLE edema management.  Can wear personal back brace to PT if needed for comfort.  Initial informal established HEP. Person educated: Patient Education method: Explanation, Demonstration, and Verbal cues Education comprehension: verbalized understanding and returned demonstration  HOME EXERCISE PROGRAM: STS, walking w/ cane in home (hallway), and weight shifting laterally and forwards/backwards at countertop daily 2-3 times per day to tolerance.  GOALS: Goals reviewed with patient? Yes  SHORT TERM GOALS: Target date: 03/30/2024  Pt will be independent and compliant with initial strength and balance HEP in order to maintain functional progress and improve mobility. Baseline:  Informally established on eval Goal status: INITIAL  2.  Pt will decrease 5xSTS to </=18.25 seconds in order to demonstrate decreased risk for falls and improved functional bilateral LE strength and power. Baseline: 23.25 sec w/ BUE support Goal status: INITIAL  3.  Pt will demonstrate TUG of </=20.38 seconds in order to decrease risk of falls and improve functional mobility using LRAD. Baseline: 25.38 sec w/ SPC SBA Goal status: INITIAL  4.  to be assessed w/ goal set. Baseline: TBA Goal status: INITIAL  5.  BERG to be assessed w/ goal set. Baseline: TBA Goal status: INITIAL  LONG TERM GOALS: Target date: 04/27/2024  Pt will be independent and compliant with advanced and finalized strength and balance HEP in order to maintain functional progress and improve mobility. Baseline: Informally established on eval Goal status: INITIAL  2.  Pt will decrease 5xSTS to </=13.25 seconds in order to demonstrate decreased risk for falls and improved functional bilateral LE strength and power. Baseline: 23.25 sec w/ BUE support Goal status: INITIAL  3.  Pt will demonstrate TUG of </=15.38 seconds in order to decrease risk of falls and improve functional mobility using LRAD. Baseline: 25.38  sec w/ SPC SBA Goal status: INITIAL  4.  to be assessed w/ goal set. Baseline: TBA Goal status: INITIAL  5.  BERG to be assessed w/ goal set. Baseline: TBA Goal status: INITIAL  6.  Pt will improve NDI score to </=12/50 in order to demonstrate improved pain management and functional capacity. Baseline: 17/50 Goal status: INITIAL  ASSESSMENT:  CLINICAL IMPRESSION: Patient is a 74 y.o. female who was seen today for physical therapy evaluation and treatment for cervical radiculopathy.  Pt has a significant PMH of DM2, asthma, OA, headaches, lumbar stenosis, R lumbar interbody fusion L4-5 and L5-S1 as well as left anterior lateral lumbar fusion L2-3 and L3-4 2022.  Identified impairments include gait instability, functional weakness, impaired grasp, impaired sensation in LE, impaired LE coordination, multiple areas of pain and baseline low level LE edema.  Evaluation via the following assessment tools: 5xSTS and TUG indicate fall risk.  She would benefit from skilled PT to address impairments as noted and progress towards long term goals.   OBJECTIVE IMPAIRMENTS: Abnormal gait, decreased activity tolerance, decreased  balance, decreased coordination, decreased endurance, decreased mobility, difficulty walking, decreased strength, increased edema, impaired sensation, improper body mechanics, postural dysfunction, and pain.   ACTIVITY LIMITATIONS: carrying, lifting, bending, standing, squatting, stairs, transfers, and locomotion level  PARTICIPATION LIMITATIONS: shopping and community activity  PERSONAL FACTORS: Age, Fitness, Past/current experiences, Time since onset of injury/illness/exacerbation, and 1-2 comorbidities: lumbar fusion, OA are also affecting patient's functional outcome.   REHAB POTENTIAL: Good  CLINICAL DECISION MAKING: Evolving/moderate complexity  EVALUATION COMPLEXITY: Moderate  PLAN:  PT FREQUENCY: 1x/week  PT DURATION: 8 weeks  PLANNED INTERVENTIONS:  97164- PT Re-evaluation, 97750- Physical Performance Testing, 97110-Therapeutic exercises, 97530- Therapeutic activity, [id number]- Neuromuscular re-education, 97535- Self Care, [id number]- Manual therapy, [id number]- Gait training, [phone]- Orthotic Initial, [phone]- Orthotic/Prosthetic subsequent, [phone]- Aquatic Therapy, [phone]- Electrical stimulation (manual), Patient/Family education, Balance training, Stair training, Taping, Joint mobilization, Vestibular training, DME instructions, Cryotherapy, and Moist heat  PLAN FOR NEXT SESSION: ASSESS and BERG - set goals.  Expand formal HEP for strength and balance.  Does she need R foot-up/AFO?  Gait training, balance, coordination, BLE/BUE strength and stretching; SciFit; cervical mobility prn   Daved KATHEE Bull, PT, DPT 02/29/2024, 12:21 PM        "

## 2024-03-06 ENCOUNTER — Encounter: Payer: Self-pay | Admitting: Podiatry

## 2024-03-06 ENCOUNTER — Ambulatory Visit: Admitting: Podiatry

## 2024-03-06 DIAGNOSIS — M79675 Pain in left toe(s): Secondary | ICD-10-CM | POA: Diagnosis not present

## 2024-03-06 DIAGNOSIS — M79674 Pain in right toe(s): Secondary | ICD-10-CM | POA: Diagnosis not present

## 2024-03-06 DIAGNOSIS — B351 Tinea unguium: Secondary | ICD-10-CM

## 2024-03-06 DIAGNOSIS — E1151 Type 2 diabetes mellitus with diabetic peripheral angiopathy without gangrene: Secondary | ICD-10-CM

## 2024-03-07 ENCOUNTER — Ambulatory Visit: Admitting: Physical Therapy

## 2024-03-07 ENCOUNTER — Encounter: Payer: Self-pay | Admitting: Physical Therapy

## 2024-03-07 ENCOUNTER — Telehealth: Payer: Self-pay | Admitting: Physical Therapy

## 2024-03-07 DIAGNOSIS — M5412 Radiculopathy, cervical region: Secondary | ICD-10-CM | POA: Diagnosis not present

## 2024-03-07 DIAGNOSIS — M6281 Muscle weakness (generalized): Secondary | ICD-10-CM

## 2024-03-07 DIAGNOSIS — M542 Cervicalgia: Secondary | ICD-10-CM

## 2024-03-07 DIAGNOSIS — R2689 Other abnormalities of gait and mobility: Secondary | ICD-10-CM

## 2024-03-07 DIAGNOSIS — R2681 Unsteadiness on feet: Secondary | ICD-10-CM

## 2024-03-07 NOTE — Telephone Encounter (Signed)
 Dr. Darnella,  [name]  was evaluated by PT on 02/29/2024.  The patient would benefit from OT evaluation for bilateral hand tightness, impaired grasp and ADL management.   If you agree, please place an order in [facility] workque in [facility] or fax the order to [phone].  Thank you,  Daved Bull, PT, DPT   [facility] [address] [place], [place]  [address] Phone:  [phone] Fax:  [phone]

## 2024-03-07 NOTE — Therapy (Signed)
 " OUTPATIENT PHYSICAL THERAPY NEURO TREATMENT   Patient Name: Darlene Mclaughlin MRN: [id number] DOB:1951-01-03, 74 y.o., female Today's Date: 03/07/2024   PCP: Theophilus Andrews, Tully GRADE, MD REFERRING PROVIDER: Darnella Dorn SAUNDERS, MD  END OF SESSION:  PT End of Session - 03/07/24 1151     Visit Number 2    Number of Visits 9   8 + eval   Date for Recertification  05/04/24   pushed out due to potential scheduling delay   Authorization Type HUMA[employer]    PT Start Time 1148    PT Stop Time 1228    PT Time Calculation (min) 40 min    Equipment Utilized During Treatment Gait belt    Activity Tolerance Patient tolerated treatment well    Behavior During Therapy [facility] for tasks assessed/performed          Past Medical History:  Diagnosis Date   ALLERGIC RHINITIS 05/15/2008   Allergy    Anemia    ASTHMA 05/15/2008   Asthma    Blood transfusion without reported diagnosis 1991   anemia   Breast cancer (HCC) 2016   right breast   Cancer (HCC)    DCIS right breast   Diabetes mellitus without complication (HCC)    GERD (gastroesophageal reflux disease)    Headache(784.0) 05/15/2008   Hyperlipidemia    HYPERTENSION 05/15/2008   IMPAIRED GLUCOSE TOLERANCE 05/15/2008   Obesity    OSTEOARTHRITIS 05/15/2008   back   Past Surgical History:  Procedure Laterality Date   ABDOMINAL HYSTERECTOMY     ANTERIOR LAT LUMBAR FUSION Left 05/21/2020   Procedure: LUMBAR TWO-THREE, LUMBAR THREE-FOUR DIRECT LATERAL LUMBAR INTERBODY FUSION;  Surgeon: Debby Dorn MATSU, MD;  Location: [facility] OR;  Service: Neurosurgery;  Laterality: Left;   APPLICATION OF ROBOTIC ASSISTANCE FOR SPINAL PROCEDURE N/A 05/21/2020   Procedure: APPLICATION OF ROBOTIC ASSISTANCE FOR SPINAL PROCEDURE;  Surgeon: Debby Dorn MATSU, MD;  Location: [facility] OR;  Service: Neurosurgery;  Laterality: N/A;   BREAST LUMPECTOMY Right 12/17/2014   BREAST LUMPECTOMY WITH RADIOACTIVE SEED LOCALIZATION Right 12/17/2014   Procedure: RIGHT BREAST  RADIOACTIVE SEED GUIDED PARTIAL MASTECTOMY;  Surgeon: Debby Shipper, MD;  Location: [facility];  Service: General;  Laterality: Right;   CESAREAN SECTION     x2   COLONOSCOPY     KNEE SURGERY     arthroscopic left   TRANSFORAMINAL LUMBAR INTERBODY FUSION W/ MIS 2 LEVEL Right 05/21/2020   Procedure: LUMBAR FOUR-FIVE, LUMBAR FIVE-SACRAL ONE MINIMALLY INVASIVE TRANSFORAMINAL LUMBAR INTERBODY FUSION WITH INSTRUMENTATION LUMBAR TWO-SACRAL ONE;  Surgeon: Debby Dorn MATSU, MD;  Location: [facility] OR;  Service: Neurosurgery;  Laterality: Right;   Patient Active Problem List   Diagnosis Date Noted   Closed fracture of lateral malleolus of right fibula 02/20/2021   Lumbar radiculopathy 05/21/2020   Statin intolerance 01/16/2018   Bilateral leg edema 12/21/2017   Bilateral lower extremity edema 12/21/2017   History of colonic polyps 10/18/2017   Dyslipidemia 10/18/2017   History of colon polyps 10/18/2017   Essential hypertension 03/26/2016   Family history of colon cancer 03/26/2016   Osteopenia 08/28/2015   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   Spinal stenosis of lumbar region 12/05/2013   Morbid obesity (HCC) 12/19/2012   Diabetes mellitus with coincident hypertension (HCC) 05/15/2008   Allergic rhinitis 05/15/2008   Asthma, mild intermittent 05/15/2008   Osteoarthritis 05/15/2008   HEADACHE 05/15/2008    ONSET DATE: 2 months  REFERRING DIAG: M54.12 (ICD-10-CM) - Radiculopathy,  cervical region  THERAPY DIAG:  Other abnormalities of gait and mobility  Muscle weakness (generalized)  Unsteadiness on feet  Cervicalgia  Radiculopathy, cervical region  Rationale for Evaluation and Treatment: Rehabilitation  SUBJECTIVE:                                                                                                                                                                                             SUBJECTIVE STATEMENT: Pt reports that her  joints were achy yesterday and attributes this to the weather today.  No falls.  She ambulates w/ a SPC. Pt accompanied by: self (she drives herself)  PERTINENT HISTORY: DM2, asthma, OA, headaches, lumbar stenosis, R lumbar interbody fusion L4-5 and L5-S1 as well as left anterior lateral lumbar fusion L2-3 and L3-4 2022  PAIN:  Are you having pain? Yes: NPRS scale: 7 Pain location: neck into hands, thighs, low back Pain description: ache Aggravating factors: cold weather Relieving factors: heat  PRECAUTIONS: Fall  RED FLAGS: Bowel or bladder incontinence: Yes: urge incontinence - pt states she has discussed w/ PCP   WEIGHT BEARING RESTRICTIONS: No  FALLS: Has patient fallen in last 6 months? Yes. Number of falls 1 - unsure if LE gave out or if she slipped on grass  LIVING ENVIRONMENT: Lives with: lives alone Lives in: House/apartment Stairs: No Has following equipment at home: Single point cane and Shower bench  PLOF: Independent and Requires assistive device for independence - prior to new symptoms she could sometimes go without her cane, but in recent months she relies on it more consistently  PATIENT GOALS: I would love to be able to just go and come and walk freely without my cane  OBJECTIVE:  Note: Objective measures were completed at Evaluation unless otherwise noted.  DIAGNOSTIC FINDINGS: no recent relevant imaging  COGNITION: Overall cognitive status: Within functional limits for tasks assessed   SENSATION: Light touch: Impaired  - unable to identify correct locations from mid-calf to toes (R seems worse than left) - pt confirms more tingling in her RLE  COORDINATION: LE RAMS:  impaired (R dec DF)  EDEMA:  1+ edema from 3 inches above lateral malleoli bilaterally to dorsum of foot - pt reports this is chronic and R swells more than L  MUSCLE TONE: None noted in BLE  POSTURE: rounded shoulders, forward head, posterior pelvic tilt, and weight shift  right  LOWER EXTREMITY ROM:     Active  Right Eval Left Eval  Hip flexion Grossly WFL Grossly WFL  Hip extension    Hip abduction    Hip adduction    Hip internal rotation  Hip external rotation    Knee flexion    Knee extension    Ankle dorsiflexion 2-3 degrees   Ankle plantarflexion    Ankle inversion    Ankle eversion     (Blank rows = not tested)  LOWER EXTREMITY MMT:    MMT Right Eval Left Eval  Hip flexion 3+ 3+  Hip extension    Hip abduction    Hip adduction    Hip internal rotation    Hip external rotation    Knee flexion 4+ 4+  Knee extension 4+ 4+  Ankle dorsiflexion 2+ 4  Ankle plantarflexion    Ankle inversion    Ankle eversion    (Blank rows = not tested)  BED MOBILITY:  Findings: Sit to supine Complete Independence Supine to sit Complete Independence Rolling to Right Complete Independence Rolling to Left Complete Independence  TRANSFERS: Sit to stand: SBA  Assistive device utilized: Single point cane     Stand to sit: SBA  Assistive device utilized: Single point cane     Chair to chair: SBA  Assistive device utilized: Single point cane       RAMP:  Not tested  CURB:  Not tested  STAIRS: Not tested GAIT: Findings: Gait Characteristics: step to pattern, step through pattern, decreased step length- Right, decreased step length- Left, decreased stride length, decreased ankle dorsiflexion- Right, shuffling, decreased trunk rotation, trunk flexed, and wide BOS, Distance walked: various clinic distances, Assistive device utilized:Single point cane, Level of assistance: SBA, and Comments: no toe drag or foot slap, but pt maintains some bilateral ER during steps and slow pace  FUNCTIONAL TESTS:  5 times sit to stand: 23.25 sec w/ BUE support Timed up and go (TUG): 25.38 sec w/ SPC SBA 10 meter walk test: TBA Berg Balance Scale:  TBA  PATIENT SURVEYS:  NDI:  NECK DISABILITY INDEX  Date: 02/29/2024 Score  Pain intensity 0 = I have no pain  at the moment  2. Personal care (washing, dressing, etc.) 1 =  I can look after myself normally but it causes extra pain  3. Lifting 3 = Pain prevents me from lifting heavy weights but I can manage light to medium   weights if they are conveniently positioned  4. Reading 0 = I can read as much as I want to with no pain in my neck  5. Headaches 2 =  I have moderate headaches, which come infrequently  6. Concentration 2 = I have a fair degree of difficulty in concentrating when I want to  7. Work 1 =  I can only do my usual work, but no more  8. Driving 2 =  I can drive my car as long as I want with moderate pain in my neck  9. Sleeping 3 =  My sleep is moderately disturbed (2-3 hrs sleepless)  10. Recreation 3 = I am able to engage in a few of my usual recreation activities because of pain in   my neck  Total 17/50   Minimum Detectable Change (90% confidence): 5 points or 10% points  TREATMENT DATE: 03/07/2024  -BERGBETHA [employer]ANTS PT Assessment - 03/07/24 1154       Standardized Balance Assessment   Standardized Balance Assessment Berg Balance Test      Berg Balance Test   Sit to Stand Able to stand  independently using hands    Standing Unsupported Able to stand safely 2 minutes    Sitting with Back Unsupported but Feet Supported on Floor or Stool Able to sit safely and securely 2 minutes    Stand to Sit Controls descent by using hands    Transfers Able to transfer safely, minor use of hands    Standing Unsupported with Eyes Closed Able to stand 10 seconds safely    Standing Unsupported with Feet Together Able to place feet together independently and stand 1 minute safely   limited by body habitus   From Standing, Reach Forward with Outstretched Arm Can reach confidently >25 cm (10)    From Standing Position, Pick up Object from Floor Able to pick up shoe safely and  easily    From Standing Position, Turn to Look Behind Over each Shoulder Looks behind from both sides and weight shifts well    Turn 360 Degrees Able to turn 360 degrees safely but slowly    Standing Unsupported, Alternately Place Feet on Step/Stool Able to complete 4 steps without aid or supervision    Standing Unsupported, One Foot in Front Able to take small step independently and hold 30 seconds    Standing on One Leg Tries to lift leg/unable to hold 3 seconds but remains standing independently    Total Score 45    Berg comment: 45/56 = significant fall risk/cane use         - :  22.75 sec SPC SBA = 0.44 m/sec OR 1.45 ft/sec  Reviewed formal HEP using frequency as established today: - Standing Tandem Balance with Counter Support  - 2-3 reps - 30 seconds hold (each LE in rear) - Standing March with Counter Support  - 2 sets - 20 reps - Sit to Stand with Armchair  - 2-3 sets - 5 reps - Side to Side Weight Shift with Counter Support  - 2 sets - 10 reps - pt reports relief in low back - 360 Degree Turn in Both Directions  - 1 x 4-5 reps (edu on visual fixation to prevent dizziness and improve stability - encouraged hand support on counter as needed and rest between reps; discussed starting w/ wider BOS and working towards her normal BOS)  PATIENT EDUCATION: Education details: Outcome interpretations and goals set.  Continue HEP w/ additions today. Person educated: Patient Education method: Explanation, Demonstration, and Verbal cues Education comprehension: verbalized understanding and returned demonstration  HOME EXERCISE PROGRAM: walking w/ cane in home (hallway) 2-3 times per day to her tolerance  Access Code: [email or web address] URL: [email or web address] Date: 03/07/2024 Prepared by: Daved Bull  Exercises - Standing Tandem Balance with Counter Support  - 1 x daily - 5 x weekly - 1 sets - 2-3 reps - 30 seconds hold - Standing March with Counter Support  - 1 x  daily - 5 x weekly - 2 sets - 20 reps - Sit to Stand with Armchair  - 1 x daily - 5 x weekly - 2-3 sets - 5 reps - Side to Side Weight Shift with Counter Support  - 1 x daily - 5 x weekly - 2 sets - 10 reps - 360 Degree Turn in Both Directions  - 1 x  daily - 5 x weekly - 1 sets - 4-5 reps  GOALS: Goals reviewed with patient? Yes  SHORT TERM GOALS: Target date: 03/30/2024  Pt will be independent and compliant with initial strength and balance HEP in order to maintain functional progress and improve mobility. Baseline:  Informally established on eval Goal status: INITIAL  2.  Pt will decrease 5xSTS to </=18.25 seconds in order to demonstrate decreased risk for falls and improved functional bilateral LE strength and power. Baseline: 23.25 sec w/ BUE support Goal status: INITIAL  3.  Pt will demonstrate TUG of </=20.38 seconds in order to decrease risk of falls and improve functional mobility using LRAD. Baseline: 25.38 sec w/ SPC SBA Goal status: INITIAL  4.  Pt will demonstrate a gait speed of >/=1.65 feet/sec in order to decrease risk for falls. Baseline: 1.45 ft/sec SPC SBA (1/14) Goal status: INITIAL  5.  Pt will increase BERG balance score to >/=48/56 to demonstrate improved static balance. Baseline: 45/56 (1/14) Goal status: INITIAL  LONG TERM GOALS: Target date: 04/27/2024  Pt will be independent and compliant with advanced and finalized strength and balance HEP in order to maintain functional progress and improve mobility. Baseline: Informally established on eval Goal status: INITIAL  2.  Pt will decrease 5xSTS to </=13.25 seconds in order to demonstrate decreased risk for falls and improved functional bilateral LE strength and power. Baseline: 23.25 sec w/ BUE support Goal status: INITIAL  3.  Pt will demonstrate TUG of </=15.38 seconds in order to decrease risk of falls and improve functional mobility using LRAD. Baseline: 25.38 sec w/ SPC SBA Goal status: INITIAL  4.   Pt will demonstrate a gait speed of >/=1.85 feet/sec in order to decrease risk for falls. Baseline: 1.45 ft/sec SPC SBA (1/14) Goal status: INITIAL  5.  Pt will increase BERG balance score to >/=51/56 to demonstrate improved static balance. Baseline: 45/56 (1/14) Goal status: INITIAL  6.  Pt will improve NDI score to </=12/50 in order to demonstrate improved pain management and functional capacity. Baseline: 17/50 Goal status: INITIAL  ASSESSMENT:  CLINICAL IMPRESSION: Patient returns to setting today for initial treatment session where BERG not captured on evaluation was assessed.  Pt scored 45/56 on test indicating increased fall risk and difficulty w/ SLS, narrowed BOS, and turning stability.  Time spent reviewing prior assigned home tasks and expanding HEP based upon deficits identified this visit.  She continues to benefit from skilled PT in this setting to improve limits of stability and balance strategies to reduce fall risk.  Continue per POC.   OBJECTIVE IMPAIRMENTS: Abnormal gait, decreased activity tolerance, decreased balance, decreased coordination, decreased endurance, decreased mobility, difficulty walking, decreased strength, increased edema, impaired sensation, improper body mechanics, postural dysfunction, and pain.   ACTIVITY LIMITATIONS: carrying, lifting, bending, standing, squatting, stairs, transfers, and locomotion level  PARTICIPATION LIMITATIONS: shopping and community activity  PERSONAL FACTORS: Age, Fitness, Past/current experiences, Time since onset of injury/illness/exacerbation, and 1-2 comorbidities: lumbar fusion, OA are also affecting patient's functional outcome.   REHAB POTENTIAL: Good  CLINICAL DECISION MAKING: Evolving/moderate complexity  EVALUATION COMPLEXITY: Moderate  PLAN:  PT FREQUENCY: 1x/week  PT DURATION: 8 weeks  PLANNED INTERVENTIONS: 97164- PT Re-evaluation, 97750- Physical Performance Testing, 97110-Therapeutic exercises, 97530-  Therapeutic activity, [id number]- Neuromuscular re-education, 97535- Self Care, 02859- Manual therapy, [id number]- Gait training, [phone]- Orthotic Initial, [phone]- Orthotic/Prosthetic subsequent, [phone]- Aquatic Therapy, [phone]- Electrical stimulation (manual), Patient/Family education, Balance training, Stair training, Taping, Joint mobilization, Vestibular training, DME instructions, Cryotherapy, and Moist  heat  PLAN FOR NEXT SESSION: Expand formal HEP for strength and balance.  Does she need R foot-up/AFO/ankle strengthening (seated vs standing tilt board/airex)?  Gait training, balance, coordination, BLE/BUE strength and stretching; SciFit; cervical mobility prn   Daved KATHEE Bull, PT, DPT 03/07/2024, 5:24 PM        "

## 2024-03-07 NOTE — Patient Instructions (Signed)
 Access Code: [email or web address] URL: [email or web address] Date: 03/07/2024 Prepared by: Daved Bull  Exercises - Standing Tandem Balance with Counter Support  - 1 x daily - 5 x weekly - 1 sets - 2-3 reps - 30 seconds hold - Standing March with Counter Support  - 1 x daily - 5 x weekly - 2 sets - 20 reps - Sit to Stand with Armchair  - 1 x daily - 5 x weekly - 2-3 sets - 5 reps - Side to Side Weight Shift with Counter Support  - 1 x daily - 5 x weekly - 2 sets - 10 reps - 360 Degree Turn in Both Directions  - 1 x daily - 5 x weekly - 1 sets - 4-5 reps

## 2024-03-12 NOTE — Progress Notes (Signed)
 "  Subjective:  Patient ID: Darlene Mclaughlin, female    DOB: 03-May-1950,  MRN: [id number]  Darlene Mclaughlin presents to clinic today for at risk foot care. Pt has h/o NIDDM with PAD and painful mycotic toenails of both feet that are difficult to trim. Pain interferes with daily activities and wearing enclosed shoe gear comfortably.  Chief Complaint  Patient presents with   Diabetes    Do my toenails and check my feet.  Saw Dr. Tully Nap - 07/28/2023; A1C - 6.8   New problem(s): None.   PCP is Nap Andrews, Tully GRADE, MD.  Allergies[1]  Review of Systems: Negative except as noted in the HPI.  Objective: No changes noted in today's physical examination. There were no vitals filed for this visit. Darlene Mclaughlin is a pleasant 73 y.o. female in NAD. AAO x 3.    Subjective:  Patient ID: Darlene Mclaughlin, female    DOB: May 02, 1950,  MRN: [id number]  Darlene Mclaughlin presents to clinic today for at risk foot care. Pt has h/o NIDDM with PAD and painful thick toenails that are difficult to trim. Pain interferes with ambulation. Aggravating factors include wearing enclosed shoe gear. Pain is relieved with periodic professional debridement. She states her left great toe lateral border was sore and she trimmed it due to length. After trimming, it did provide some relief, but is still tender.  Chief Complaint  Patient presents with   Diabetes    Do my toenails and check my feet.  Saw Dr. Tully Nap - 07/28/2023; A1C - 6.8    PCP is Nap Andrews, Tully GRADE, MD.  Allergies  Allergen Reactions   Atorvastatin      Hallucinations    Hydrochlorothiazide      Muscle pain   Hydrocodone     Swollen tongue   Macrobid  [Nitrofurantoin  Monohyd Macro]      Liver problems   Naproxen  Swelling    Mouth and tongue swelling   Simvastatin      Hallucinations, muscle pains   Zanaflex  [Tizanidine  Hcl] Other (See Comments)     Liver problems   Tizanidine  Rash    Review of Systems:  Negative except as noted in the HPI.  Objective:  There were no vitals filed for this visit. Darlene Mclaughlin is a pleasant 74 y.o. female in NAD. AAO x 3.  Vascular Examination: CFT <3 seconds b/l. Nonpalpable pulses RLE and nonpalpable PT LLE. Faintly palpable DP pulses LLE.  Digital hair absent. Skin temperature gradient warm to cool b/l. No ischemia or gangrene. No cyanosis or clubbing noted b/l. No edema noted b/l LE.   Neurological Examination: Sensation grossly intact b/l with 10 gram monofilament. Vibratory sensation intact b/l.   Dermatological Examination: Pedal skin thin, shiny and atrophic b/l. No open wounds. No interdigital macerations.   Toenails 1-5 b/l thick, discolored, elongated with subungual debris and pain on dorsal palpation.   Musculoskeletal Examination: Muscle strength 5/5 to all lower extremity muscle groups bilaterally. HAV with bunion deformity noted b/l LE. Hammertoe(s) 2-5 b/l.  Radiographs: None  Assessment/Plan: 1. Pain due to onychomycosis of toenails of both feet   2. Type II diabetes mellitus with peripheral circulatory disorder [facility])    Patient was evaluated and treated. All patient's and/or POA's questions/concerns addressed on today's visit. Mycotic toenails 1-5 b/l debrided in length and girth without incident.  Continue daily foot inspections and monitor blood glucose per PCP/Endocrinologist's recommendations.Continue soft, supportive shoe gear daily. Report any pedal injuries to medical  professional. Call office if there are any quesitons/concerns. -Patient/POA to call should there be question/concern in the interim.   Return in about 3 months (around 06/04/2024).  Delon LITTIE Merlin, DPM      [place] LOCATION: [address] [place], [place] [address]                   Office [phone]   [place] LOCATION: [address] [place], [place] [address] Office [phone]      [1]  Allergies Allergen Reactions   Atorvastatin      Hallucinations    Hydrochlorothiazide      Muscle pain   Hydrocodone     Swollen tongue   Macrobid  [Nitrofurantoin  Monohyd Macro]      Liver problems   Naproxen  Swelling    Mouth and tongue swelling   Simvastatin      Hallucinations, muscle pains   Zanaflex  [Tizanidine  Hcl] Other (See Comments)     Liver problems   Tizanidine  Rash   "

## 2024-03-14 ENCOUNTER — Encounter: Payer: Self-pay | Admitting: Physical Therapy

## 2024-03-14 ENCOUNTER — Ambulatory Visit: Admitting: Physical Therapy

## 2024-03-14 DIAGNOSIS — M6281 Muscle weakness (generalized): Secondary | ICD-10-CM

## 2024-03-14 DIAGNOSIS — M5412 Radiculopathy, cervical region: Secondary | ICD-10-CM

## 2024-03-14 DIAGNOSIS — R2681 Unsteadiness on feet: Secondary | ICD-10-CM

## 2024-03-14 DIAGNOSIS — M542 Cervicalgia: Secondary | ICD-10-CM

## 2024-03-14 DIAGNOSIS — R2689 Other abnormalities of gait and mobility: Secondary | ICD-10-CM

## 2024-03-14 NOTE — Therapy (Signed)
 " OUTPATIENT PHYSICAL THERAPY NEURO TREATMENT   Patient Name: Darlene Mclaughlin MRN: [id number] DOB:1950-07-10, 74 y.o., female Today's Date: 03/14/2024   PCP: Theophilus Andrews, Tully GRADE, MD REFERRING PROVIDER: Darnella Dorn SAUNDERS, MD  END OF SESSION:  PT End of Session - 03/14/24 1155     Visit Number 3    Number of Visits 9   8 + eval   Date for Recertification  05/04/24   pushed out due to potential scheduling delay   Authorization Type [employer]    PT Start Time 1149    PT Stop Time 1230    PT Time Calculation (min) 41 min    Equipment Utilized During Treatment Gait belt    Activity Tolerance Patient tolerated treatment well    Behavior During Therapy [place] for tasks assessed/performed          Past Medical History:  Diagnosis Date   ALLERGIC RHINITIS 05/15/2008   Allergy    Anemia    ASTHMA 05/15/2008   Asthma    Blood transfusion without reported diagnosis 1991   anemia   Breast cancer (HCC) 2016   right breast   Cancer (HCC)    DCIS right breast   Diabetes mellitus without complication (HCC)    GERD (gastroesophageal reflux disease)    Headache(784.0) 05/15/2008   Hyperlipidemia    HYPERTENSION 05/15/2008   IMPAIRED GLUCOSE TOLERANCE 05/15/2008   Obesity    OSTEOARTHRITIS 05/15/2008   back   Past Surgical History:  Procedure Laterality Date   ABDOMINAL HYSTERECTOMY     ANTERIOR LAT LUMBAR FUSION Left 05/21/2020   Procedure: LUMBAR TWO-THREE, LUMBAR THREE-FOUR DIRECT LATERAL LUMBAR INTERBODY FUSION;  Surgeon: Debby Dorn MATSU, MD;  Location: [facility] OR;  Service: Neurosurgery;  Laterality: Left;   APPLICATION OF ROBOTIC ASSISTANCE FOR SPINAL PROCEDURE N/A 05/21/2020   Procedure: APPLICATION OF ROBOTIC ASSISTANCE FOR SPINAL PROCEDURE;  Surgeon: Debby Dorn MATSU, MD;  Location: [facility] OR;  Service: Neurosurgery;  Laterality: N/A;   BREAST LUMPECTOMY Right 12/17/2014   BREAST LUMPECTOMY WITH RADIOACTIVE SEED LOCALIZATION Right 12/17/2014   Procedure: RIGHT BREAST  RADIOACTIVE SEED GUIDED PARTIAL MASTECTOMY;  Surgeon: Debby Shipper, MD;  Location: [facility];  Service: General;  Laterality: Right;   CESAREAN SECTION     x2   COLONOSCOPY     KNEE SURGERY     arthroscopic left   TRANSFORAMINAL LUMBAR INTERBODY FUSION W/ MIS 2 LEVEL Right 05/21/2020   Procedure: LUMBAR FOUR-FIVE, LUMBAR FIVE-SACRAL ONE MINIMALLY INVASIVE TRANSFORAMINAL LUMBAR INTERBODY FUSION WITH INSTRUMENTATION LUMBAR TWO-SACRAL ONE;  Surgeon: Debby Dorn MATSU, MD;  Location: [facility] OR;  Service: Neurosurgery;  Laterality: Right;   Patient Active Problem List   Diagnosis Date Noted   Closed fracture of lateral malleolus of right fibula 02/20/2021   Lumbar radiculopathy 05/21/2020   Statin intolerance 01/16/2018   Bilateral leg edema 12/21/2017   Bilateral lower extremity edema 12/21/2017   History of colonic polyps 10/18/2017   Dyslipidemia 10/18/2017   History of colon polyps 10/18/2017   Essential hypertension 03/26/2016   Family history of colon cancer 03/26/2016   Osteopenia 08/28/2015   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   Spinal stenosis of lumbar region 12/05/2013   Morbid obesity (HCC) 12/19/2012   Diabetes mellitus with coincident hypertension (HCC) 05/15/2008   Allergic rhinitis 05/15/2008   Asthma, mild intermittent 05/15/2008   Osteoarthritis 05/15/2008   HEADACHE 05/15/2008    ONSET DATE: 2 months  REFERRING DIAG: M54.12 (ICD-10-CM) - Radiculopathy,  cervical region  THERAPY DIAG:  Other abnormalities of gait and mobility  Muscle weakness (generalized)  Unsteadiness on feet  Cervicalgia  Radiculopathy, cervical region  Rationale for Evaluation and Treatment: Rehabilitation  SUBJECTIVE:                                                                                                                                                                                             SUBJECTIVE STATEMENT: Pt reports that her  pain is better today though she can feel the colder weather.  No falls or near falls and still relying on [facility].   Pt accompanied by: self (she drives herself)  PERTINENT HISTORY: DM2, asthma, OA, headaches, lumbar stenosis, R lumbar interbody fusion L4-5 and L5-S1 as well as left anterior lateral lumbar fusion L2-3 and L3-4 2022  PAIN:  Are you having pain? Yes: NPRS scale: 0 Pain location: neck into hands, thighs, low back Pain description: ache Aggravating factors: cold weather Relieving factors: heat  PRECAUTIONS: Fall  RED FLAGS: Bowel or bladder incontinence: Yes: urge incontinence - pt states she has discussed w/ PCP   WEIGHT BEARING RESTRICTIONS: No  FALLS: Has patient fallen in last 6 months? Yes. Number of falls 1 - unsure if LE gave out or if she slipped on grass  LIVING ENVIRONMENT: Lives with: lives alone Lives in: House/apartment Stairs: No Has following equipment at home: Single point cane and Shower bench  PLOF: Independent and Requires assistive device for independence - prior to new symptoms she could sometimes go without her cane, but in recent months she relies on it more consistently  PATIENT GOALS: I would love to be able to just go and come and walk freely without my cane  OBJECTIVE:  Note: Objective measures were completed at Evaluation unless otherwise noted.  DIAGNOSTIC FINDINGS: no recent relevant imaging  COGNITION: Overall cognitive status: Within functional limits for tasks assessed   SENSATION: Light touch: Impaired  - unable to identify correct locations from mid-calf to toes (R seems worse than left) - pt confirms more tingling in her RLE  COORDINATION: LE RAMS:  impaired (R dec DF)  EDEMA:  1+ edema from 3 inches above lateral malleoli bilaterally to dorsum of foot - pt reports this is chronic and R swells more than L  MUSCLE TONE: None noted in BLE  POSTURE: rounded shoulders, forward head, posterior pelvic tilt, and weight shift  right  LOWER EXTREMITY ROM:     Active  Right Eval Left Eval  Hip flexion Grossly WFL Grossly WFL  Hip extension    Hip abduction    Hip adduction  Hip internal rotation    Hip external rotation    Knee flexion    Knee extension    Ankle dorsiflexion 2-3 degrees   Ankle plantarflexion    Ankle inversion    Ankle eversion     (Blank rows = not tested)  LOWER EXTREMITY MMT:    MMT Right Eval Left Eval  Hip flexion 3+ 3+  Hip extension    Hip abduction    Hip adduction    Hip internal rotation    Hip external rotation    Knee flexion 4+ 4+  Knee extension 4+ 4+  Ankle dorsiflexion 2+ 4  Ankle plantarflexion    Ankle inversion    Ankle eversion    (Blank rows = not tested)  BED MOBILITY:  Findings: Sit to supine Complete Independence Supine to sit Complete Independence Rolling to Right Complete Independence Rolling to Left Complete Independence  TRANSFERS: Sit to stand: SBA  Assistive device utilized: Single point cane     Stand to sit: SBA  Assistive device utilized: Single point cane     Chair to chair: SBA  Assistive device utilized: Single point cane       RAMP:  Not tested  CURB:  Not tested  STAIRS: Not tested GAIT: Findings: Gait Characteristics: step to pattern, step through pattern, decreased step length- Right, decreased step length- Left, decreased stride length, decreased ankle dorsiflexion- Right, shuffling, decreased trunk rotation, trunk flexed, and wide BOS, Distance walked: various clinic distances, Assistive device utilized:Single point cane, Level of assistance: SBA, and Comments: no toe drag or foot slap, but pt maintains some bilateral ER during steps and slow pace  FUNCTIONAL TESTS:  5 times sit to stand: 23.25 sec w/ BUE support Timed up and go (TUG): 25.38 sec w/ SPC SBA 10 meter walk test: TBA Berg Balance Scale:  TBA  PATIENT SURVEYS:  NDI:  NECK DISABILITY INDEX  Date: 02/29/2024 Score  Pain intensity 0 = I have no pain  at the moment  2. Personal care (washing, dressing, etc.) 1 =  I can look after myself normally but it causes extra pain  3. Lifting 3 = Pain prevents me from lifting heavy weights but I can manage light to medium   weights if they are conveniently positioned  4. Reading 0 = I can read as much as I want to with no pain in my neck  5. Headaches 2 =  I have moderate headaches, which come infrequently  6. Concentration 2 = I have a fair degree of difficulty in concentrating when I want to  7. Work 1 =  I can only do my usual work, but no more  8. Driving 2 =  I can drive my car as long as I want with moderate pain in my neck  9. Sleeping 3 =  My sleep is moderately disturbed (2-3 hrs sleepless)  10. Recreation 3 = I am able to engage in a few of my usual recreation activities because of pain in   my neck  Total 17/50   Minimum Detectable Change (90% confidence): 5 points or 10% points  TREATMENT DATE: 03/14/2024  Seated tilt board:  -A/P tilts focused on slow movement and end range stretch x2-3 sec each rep over 2 minutes  -Lateral tilts using same technique as A/P over 2 minutes Seated Baps board on medium height setting:  -CW circles x1 minute  -CCW circles x1 minute  -R ankle DF/PF x1 minute for increased instability -Seated DF stretch w/ board 2x60 sec using overpressure at knees as tolerated Airex // bars:  -Alternating normal and slightly narrowed stance for eyes open > eyes closed several reps of varying length working into unsupported stance, pt has difficulty w/ narrowed stance due to valgus knee positioning  -Marching unsupported CGA x2 minutes working on maintaining positioning on pad vs migration  -SciFit x8 minutes in twin peaks mode using BUE/BLE up to level 4.0 for endurance challenge and large amplitude reciprocal mobility; achieved 7.9 inch avg stride;  cued for 10 inch  PATIENT EDUCATION: Education details: Outcome interpretations and goals set.  Continue HEP w/ additions today.  Discussed anatomy and weakness related to knee positioning and goals of PT to compensate for this with strength and balance. Person educated: Patient Education method: Explanation, Demonstration, and Verbal cues Education comprehension: verbalized understanding and returned demonstration  HOME EXERCISE PROGRAM: walking w/ cane in home (hallway) 2-3 times per day to her tolerance  Access Code: [email or web address] URL: [email or web address] Date: 03/07/2024 Prepared by: Daved Bull  Exercises - Standing Tandem Balance with Counter Support  - 1 x daily - 5 x weekly - 1 sets - 2-3 reps - 30 seconds hold - Standing March with Counter Support  - 1 x daily - 5 x weekly - 2 sets - 20 reps - Sit to Stand with Armchair  - 1 x daily - 5 x weekly - 2-3 sets - 5 reps - Side to Side Weight Shift with Counter Support  - 1 x daily - 5 x weekly - 2 sets - 10 reps - 360 Degree Turn in Both Directions  - 1 x daily - 5 x weekly - 1 sets - 4-5 reps  GOALS: Goals reviewed with patient? Yes  SHORT TERM GOALS: Target date: 03/30/2024  Pt will be independent and compliant with initial strength and balance HEP in order to maintain functional progress and improve mobility. Baseline:  Informally established on eval Goal status: INITIAL  2.  Pt will decrease 5xSTS to </=18.25 seconds in order to demonstrate decreased risk for falls and improved functional bilateral LE strength and power. Baseline: 23.25 sec w/ BUE support Goal status: INITIAL  3.  Pt will demonstrate TUG of </=20.38 seconds in order to decrease risk of falls and improve functional mobility using LRAD. Baseline: 25.38 sec w/ SPC SBA Goal status: INITIAL  4.  Pt will demonstrate a gait speed of >/=1.65 feet/sec in order to decrease risk for falls. Baseline: 1.45 ft/sec SPC SBA (1/14) Goal status:  INITIAL  5.  Pt will increase BERG balance score to >/=48/56 to demonstrate improved static balance. Baseline: 45/56 (1/14) Goal status: INITIAL  LONG TERM GOALS: Target date: 04/27/2024  Pt will be independent and compliant with advanced and finalized strength and balance HEP in order to maintain functional progress and improve mobility. Baseline: Informally established on eval Goal status: INITIAL  2.  Pt will decrease 5xSTS to </=13.25 seconds in order to demonstrate decreased risk for falls and improved functional bilateral LE strength and power. Baseline: 23.25 sec w/ BUE support Goal status: INITIAL  3.  Pt will demonstrate TUG  of </=15.38 seconds in order to decrease risk of falls and improve functional mobility using LRAD. Baseline: 25.38 sec w/ SPC SBA Goal status: INITIAL  4.  Pt will demonstrate a gait speed of >/=1.85 feet/sec in order to decrease risk for falls. Baseline: 1.45 ft/sec SPC SBA (1/14) Goal status: INITIAL  5.  Pt will increase BERG balance score to >/=51/56 to demonstrate improved static balance. Baseline: 45/56 (1/14) Goal status: INITIAL  6.  Pt will improve NDI score to </=12/50 in order to demonstrate improved pain management and functional capacity. Baseline: 17/50 Goal status: INITIAL  ASSESSMENT:  CLINICAL IMPRESSION: Continued work on ankle mobility and strengthening particularly of the RLE.  She continues to have some chronic right ankle edema limiting flexibility and impacting balance strategies and possibly sensory perception.  She would benefit from further work on hip strengthening as knee valgus impacting static balance and general RLE positioning for dynamic tasks.  Will expand HEP for higher level strength and balance in future visits as appropriate. Continue per POC.   OBJECTIVE IMPAIRMENTS: Abnormal gait, decreased activity tolerance, decreased balance, decreased coordination, decreased endurance, decreased mobility, difficulty walking,  decreased strength, increased edema, impaired sensation, improper body mechanics, postural dysfunction, and pain.   ACTIVITY LIMITATIONS: carrying, lifting, bending, standing, squatting, stairs, transfers, and locomotion level  PARTICIPATION LIMITATIONS: shopping and community activity  PERSONAL FACTORS: Age, Fitness, Past/current experiences, Time since onset of injury/illness/exacerbation, and 1-2 comorbidities: lumbar fusion, OA are also affecting patient's functional outcome.   REHAB POTENTIAL: Good  CLINICAL DECISION MAKING: Evolving/moderate complexity  EVALUATION COMPLEXITY: Moderate  PLAN:  PT FREQUENCY: 1x/week  PT DURATION: 8 weeks  PLANNED INTERVENTIONS: [id number]- PT Re-evaluation, 97750- Physical Performance Testing, 97110-Therapeutic exercises, 97530- Therapeutic activity, [id number]- Neuromuscular re-education, 97535- Self Care, [id number]- Manual therapy, [id number]- Gait training, [phone]- Orthotic Initial, [phone]- Orthotic/Prosthetic subsequent, [phone]- Aquatic Therapy, [phone]- Electrical stimulation (manual), Patient/Family education, Balance training, Stair training, Taping, Joint mobilization, Vestibular training, DME instructions, Cryotherapy, and Moist heat  PLAN FOR NEXT SESSION: Expand formal HEP for strength and balance.  Does she need R foot-up/AFO/ankle strengthening (standing tilt board/airex)?  Gait training, balance, coordination, BLE/BUE strength and stretching; SciFit; cervical mobility prn, hip strengthening for knee valgus protection   Daved KATHEE Bull, PT, DPT 03/14/2024, 12:36 PM        "

## 2024-03-21 ENCOUNTER — Ambulatory Visit: Admitting: Physical Therapy

## 2024-03-26 ENCOUNTER — Other Ambulatory Visit: Payer: Self-pay | Admitting: Internal Medicine

## 2024-03-26 ENCOUNTER — Ambulatory Visit: Admitting: Internal Medicine

## 2024-03-27 ENCOUNTER — Telehealth: Payer: Self-pay

## 2024-03-27 MED ORDER — CHLORTHALIDONE 25 MG PO TABS: 25.0000 mg | ORAL_TABLET | Freq: Every day | ORAL | 1 refills | Status: AC

## 2024-03-27 NOTE — Telephone Encounter (Signed)
 Copied from CRM [id number]. Topic: Clinical - Medication Question >> Mar 26, 2024  4:57 PM Drema MATSU wrote: Reason for CRM: Pt called to check on chlorthalidone  (HYGROTON ) 25 MG tablet. Advised pharmacy just sent request today. Pt has been out of meds for 3 days.  Pharmacy stated that she picked it up on 01/18 and she did not.

## 2024-03-27 NOTE — Telephone Encounter (Signed)
 Refill sent

## 2024-03-28 ENCOUNTER — Encounter: Payer: Self-pay | Admitting: Physical Therapy

## 2024-03-28 ENCOUNTER — Ambulatory Visit: Admitting: Physical Therapy

## 2024-03-28 DIAGNOSIS — R2689 Other abnormalities of gait and mobility: Secondary | ICD-10-CM

## 2024-03-28 DIAGNOSIS — R2681 Unsteadiness on feet: Secondary | ICD-10-CM

## 2024-03-28 DIAGNOSIS — M5412 Radiculopathy, cervical region: Secondary | ICD-10-CM

## 2024-03-28 DIAGNOSIS — M542 Cervicalgia: Secondary | ICD-10-CM

## 2024-03-28 DIAGNOSIS — M6281 Muscle weakness (generalized): Secondary | ICD-10-CM

## 2024-03-28 NOTE — Patient Instructions (Signed)
 Access Code: [email or web address] URL: [email or web address] Date: 03/28/2024 Prepared by: Daved Bull  Exercises - Standing Tandem Balance with Counter Support  - 1 x daily - 5 x weekly - 1 sets - 2-3 reps - 30 seconds hold - Standing March with Counter Support  - 1 x daily - 5 x weekly - 2 sets - 20 reps - Sit to Stand with Armchair  - 1 x daily - 5 x weekly - 2-3 sets - 5 reps - Side to Side Weight Shift with Counter Support  - 1 x daily - 5 x weekly - 2 sets - 10 reps - 360 Degree Turn in Both Directions  - 1 x daily - 5 x weekly - 1 sets - 4-5 reps - Forward Backward Monster Walk with Band at Thighs and Counter Support  - 1 x daily - 5 x weekly - 3 sets - 10 reps - Side Stepping with Resistance at Thighs and Counter Support  - 1 x daily - 5 x weekly - 3 sets - 10 reps - Corner Balance Feet Together With Eyes Closed  - 1 x daily - 5 x weekly - 1 sets - 3-4 reps - 30 seconds hold - Seated Heel Toe Raises  - 1 x daily - 5 x weekly - 3 sets - 10 reps

## 2024-03-28 NOTE — Therapy (Signed)
 " OUTPATIENT PHYSICAL THERAPY NEURO TREATMENT   Patient Name: Darlene Mclaughlin MRN: [id number] DOB:1950/03/14, 74 y.o., female Today's Date: 03/28/2024   PCP: Darlene Mclaughlin, Darlene GRADE, MD REFERRING PROVIDER: Darnella Dorn SAUNDERS, MD  END OF SESSION:  PT End of Session - 03/28/24 1121     Visit Number 4    Number of Visits 9   8 + eval   Date for Recertification  05/04/24   pushed out due to potential scheduling delay   Authorization Type [employer]    PT Start Time 1117    PT Stop Time 1202    PT Time Calculation (min) 45 min    Equipment Utilized During Treatment Gait belt    Activity Tolerance Patient tolerated treatment well    Behavior During Therapy [facility] for tasks assessed/performed          Past Medical History:  Diagnosis Date   ALLERGIC RHINITIS 05/15/2008   Allergy    Anemia    ASTHMA 05/15/2008   Asthma    Blood transfusion without reported diagnosis 1991   anemia   Breast cancer (HCC) 2016   right breast   Cancer (HCC)    DCIS right breast   Diabetes mellitus without complication (HCC)    GERD (gastroesophageal reflux disease)    Headache(784.0) 05/15/2008   Hyperlipidemia    HYPERTENSION 05/15/2008   IMPAIRED GLUCOSE TOLERANCE 05/15/2008   Obesity    OSTEOARTHRITIS 05/15/2008   back   Past Surgical History:  Procedure Laterality Date   ABDOMINAL HYSTERECTOMY     ANTERIOR LAT LUMBAR FUSION Left 05/21/2020   Procedure: LUMBAR TWO-THREE, LUMBAR THREE-FOUR DIRECT LATERAL LUMBAR INTERBODY FUSION;  Surgeon: Darlene Dorn MATSU, MD;  Location: [facility] OR;  Service: Neurosurgery;  Laterality: Left;   APPLICATION OF ROBOTIC ASSISTANCE FOR SPINAL PROCEDURE N/A 05/21/2020   Procedure: APPLICATION OF ROBOTIC ASSISTANCE FOR SPINAL PROCEDURE;  Surgeon: Darlene Dorn MATSU, MD;  Location[facility];  Service: Neurosurgery;  Laterality: N/A;   BREAST LUMPECTOMY Right 12/17/2014   BREAST LUMPECTOMY WITH RADIOACTIVE SEED LOCALIZATION Right 12/17/2014   Procedure: RIGHT BREAST  RADIOACTIVE SEED GUIDED PARTIAL MASTECTOMY;  Surgeon: Darlene Shipper, MD;  Location: [facility];  Service: General;  Laterality: Right;   CESAREAN SECTION     x2   COLONOSCOPY     KNEE SURGERY     arthroscopic left   TRANSFORAMINAL LUMBAR INTERBODY FUSION W/ MIS 2 LEVEL Right 05/21/2020   Procedure: LUMBAR FOUR-FIVE, LUMBAR FIVE-SACRAL ONE MINIMALLY INVASIVE TRANSFORAMINAL LUMBAR INTERBODY FUSION WITH INSTRUMENTATION LUMBAR TWO-SACRAL ONE;  Surgeon: Darlene Dorn MATSU, MD;  Location: [facility] OR;  Service: Neurosurgery;  Laterality: Right;   Patient Active Problem List   Diagnosis Date Noted   Closed fracture of lateral malleolus of right fibula 02/20/2021   Lumbar radiculopathy 05/21/2020   Statin intolerance 01/16/2018   Bilateral leg edema 12/21/2017   Bilateral lower extremity edema 12/21/2017   History of colonic polyps 10/18/2017   Dyslipidemia 10/18/2017   History of colon polyps 10/18/2017   Essential hypertension 03/26/2016   Family history of colon cancer 03/26/2016   Osteopenia 08/28/2015   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   Spinal stenosis of lumbar region 12/05/2013   Morbid obesity (HCC) 12/19/2012   Diabetes mellitus with coincident hypertension (HCC) 05/15/2008   Allergic rhinitis 05/15/2008   Asthma, mild intermittent 05/15/2008   Osteoarthritis 05/15/2008   HEADACHE 05/15/2008    ONSET DATE: 2 months  REFERRING DIAG: M54.12 (ICD-10-CM) - Radiculopathy,  cervical region  THERAPY DIAG:  Other abnormalities of gait and mobility  Muscle weakness (generalized)  Unsteadiness on feet  Cervicalgia  Radiculopathy, cervical region  Rationale for Evaluation and Treatment: Rehabilitation  SUBJECTIVE:                                                                                                                                                                                             SUBJECTIVE STATEMENT: Pt reports she is  not feeling as well as prior visit due to the snow.  She is wearing her low back belt.  She reports low level discomfort of 2-3/10 in her low back.  No falls or near falls and still relying on [facility].   Pt accompanied by: self (she drives herself)  PERTINENT HISTORY: DM2, asthma, OA, headaches, lumbar stenosis, R lumbar interbody fusion L4-5 and L5-S1 as well as left anterior lateral lumbar fusion L2-3 and L3-4 2022  PAIN:  Are you having pain? Yes: NPRS scale: 2-3 Pain location: neck into hands, thighs, low back Pain description: ache Aggravating factors: cold weather Relieving factors: heat  PRECAUTIONS: Fall  RED FLAGS: Bowel or bladder incontinence: Yes: urge incontinence - pt states she has discussed w/ PCP   WEIGHT BEARING RESTRICTIONS: No  FALLS: Has patient fallen in last 6 months? Yes. Number of falls 1 - unsure if LE gave out or if she slipped on grass  LIVING ENVIRONMENT: Lives with: lives alone Lives in: House/apartment Stairs: No Has following equipment at home: Single point cane and Shower bench  PLOF: Independent and Requires assistive device for independence - prior to new symptoms she could sometimes go without her cane, but in recent months she relies on it more consistently  PATIENT GOALS: I would love to be able to just go and come and walk freely without my cane  OBJECTIVE:  Note: Objective measures were completed at Evaluation unless otherwise noted.  DIAGNOSTIC FINDINGS: no recent relevant imaging  COGNITION: Overall cognitive status: Within functional limits for tasks assessed   SENSATION: Light touch: Impaired  - unable to identify correct locations from mid-calf to toes (R seems worse than left) - pt confirms more tingling in her RLE  COORDINATION: LE RAMS:  impaired (R dec DF)  EDEMA:  1+ edema from 3 inches above lateral malleoli bilaterally to dorsum of foot - pt reports this is chronic and R swells more than L  MUSCLE TONE: None noted in  BLE  POSTURE: rounded shoulders, forward head, posterior pelvic tilt, and weight shift right  LOWER EXTREMITY ROM:     Active  Right Eval Left Eval  Hip  flexion Grossly WFL Grossly [facility]  Hip extension    Hip abduction    Hip adduction    Hip internal rotation    Hip external rotation    Knee flexion    Knee extension    Ankle dorsiflexion 2-3 degrees   Ankle plantarflexion    Ankle inversion    Ankle eversion     (Blank rows = not tested)  LOWER EXTREMITY MMT:    MMT Right Eval Left Eval  Hip flexion 3+ 3+  Hip extension    Hip abduction    Hip adduction    Hip internal rotation    Hip external rotation    Knee flexion 4+ 4+  Knee extension 4+ 4+  Ankle dorsiflexion 2+ 4  Ankle plantarflexion    Ankle inversion    Ankle eversion    (Blank rows = not tested)  BED MOBILITY:  Findings: Sit to supine Complete Independence Supine to sit Complete Independence Rolling to Right Complete Independence Rolling to Left Complete Independence  TRANSFERS: Sit to stand: SBA  Assistive device utilized: Single point cane     Stand to sit: SBA  Assistive device utilized: Single point cane     Chair to chair: SBA  Assistive device utilized: Single point cane       RAMP:  Not tested  CURB:  Not tested  STAIRS: Not tested GAIT: Findings: Gait Characteristics: step to pattern, step through pattern, decreased step length- Right, decreased step length- Left, decreased stride length, decreased ankle dorsiflexion- Right, shuffling, decreased trunk rotation, trunk flexed, and wide BOS, Distance walked: various clinic distances, Assistive device utilized:Single point cane, Level of assistance: SBA, and Comments: no toe drag or foot slap, but pt maintains some bilateral ER during steps and slow pace  FUNCTIONAL TESTS:  5 times sit to stand: 23.25 sec w/ BUE support Timed up and go (TUG): 25.38 sec w/ SPC SBA 10 meter walk test: TBA Berg Balance Scale:  TBA  PATIENT SURVEYS:   NDI:  NECK DISABILITY INDEX  Date: 02/29/2024 Score  Pain intensity 0 = I have no pain at the moment  2. Personal care (washing, dressing, etc.) 1 =  I can look after myself normally but it causes extra pain  3. Lifting 3 = Pain prevents me from lifting heavy weights but I can manage light to medium   weights if they are conveniently positioned  4. Reading 0 = I can read as much as I want to with no pain in my neck  5. Headaches 2 =  I have moderate headaches, which come infrequently  6. Concentration 2 = I have a fair degree of difficulty in concentrating when I want to  7. Work 1 =  I can only do my usual work, but no more  8. Driving 2 =  I can drive my car as long as I want with moderate pain in my neck  9. Sleeping 3 =  My sleep is moderately disturbed (2-3 hrs sleepless)  10. Recreation 3 = I am able to engage in a few of my usual recreation activities because of pain in   my neck  Total 17/50   Minimum Detectable Change (90% confidence): 5 points or 10% points  TREATMENT DATE: 03/28/2024  At counter: -Side stepping w/ red band at thigh 8x10 ft -Forward and backward monster walks w/ red band at thigh 8x10 ft -Attempted standing heel and toe raises w/ pt unable to complete partial ROM and increased difficulty keeping RLE in place -FT EO > FT EC 3x30 sec each unsupported, SBA, min sway, pt has valgus positioning of knees making it difficult to obtain complete FT -Seated heel and toe raises 2x2 minutes each direction, reduced ROM in R ankle  -SciFit x8 minutes in twin peaks mode using BUE/BLE up to level 4.0 for endurance challenge and large amplitude reciprocal mobility; achieved 8.8 inch avg stride; cued for 10 inch stride (mod cuing provided as pt distractible during task)  PATIENT EDUCATION: Education details: Continue HEP w/ additions today-answered questions  throughout.  Neuro overflow principle and paying increased attention to RLE (foot/ankle especially) during HEP to improve muscle engagement and to make form correction when it is difficult to hold or obtain positions with that limb.  Scheduling OT eval - we have referral. Person educated: Patient Education method: Explanation, Demonstration, and Verbal cues Education comprehension: verbalized understanding and returned demonstration  HOME EXERCISE PROGRAM: walking w/ cane in home (hallway) 2-3 times per day to her tolerance  Access Code: [email or web address] URL: [email or web address] Date: 03/07/2024 Prepared by: Daved Bull  Exercises - Standing Tandem Balance with Counter Support  - 1 x daily - 5 x weekly - 1 sets - 2-3 reps - 30 seconds hold - Standing March with Counter Support  - 1 x daily - 5 x weekly - 2 sets - 20 reps - Sit to Stand with Armchair  - 1 x daily - 5 x weekly - 2-3 sets - 5 reps - Side to Side Weight Shift with Counter Support  - 1 x daily - 5 x weekly - 2 sets - 10 reps - 360 Degree Turn in Both Directions  - 1 x daily - 5 x weekly - 1 sets - 4-5 reps - Forward Backward Monster Walk with Band at Thighs and Counter Support  - 1 x daily - 5 x weekly - 3 sets - 10 reps - Side Stepping with Resistance at Thighs and Counter Support  - 1 x daily - 5 x weekly - 3 sets - 10 reps - Corner Balance Feet Together With Eyes Closed  - 1 x daily - 5 x weekly - 1 sets - 3-4 reps - 30 seconds hold - Seated Heel Toe Raises  - 1 x daily - 5 x weekly - 3 sets - 10 reps  GOALS: Goals reviewed with patient? Yes  SHORT TERM GOALS: Target date: 03/30/2024  Pt will be independent and compliant with initial strength and balance HEP in order to maintain functional progress and improve mobility. Baseline:  Informally established on eval Goal status: INITIAL  2.  Pt will decrease 5xSTS to </=18.25 seconds in order to demonstrate decreased risk for falls and improved functional  bilateral LE strength and power. Baseline: 23.25 sec w/ BUE support Goal status: INITIAL  3.  Pt will demonstrate TUG of </=20.38 seconds in order to decrease risk of falls and improve functional mobility using LRAD. Baseline: 25.38 sec w/ SPC SBA Goal status: INITIAL  4.  Pt will demonstrate a gait speed of >/=1.65 feet/sec in order to decrease risk for falls. Baseline: 1.45 ft/sec SPC SBA (1/14) Goal status: INITIAL  5.  Pt will increase BERG balance score to >/=48/56 to demonstrate improved static balance.  Baseline: 45/56 (1/14) Goal status: INITIAL  LONG TERM GOALS: Target date: 04/27/2024  Pt will be independent and compliant with advanced and finalized strength and balance HEP in order to maintain functional progress and improve mobility. Baseline: Informally established on eval Goal status: INITIAL  2.  Pt will decrease 5xSTS to </=13.25 seconds in order to demonstrate decreased risk for falls and improved functional bilateral LE strength and power. Baseline: 23.25 sec w/ BUE support Goal status: INITIAL  3.  Pt will demonstrate TUG of </=15.38 seconds in order to decrease risk of falls and improve functional mobility using LRAD. Baseline: 25.38 sec w/ SPC SBA Goal status: INITIAL  4.  Pt will demonstrate a gait speed of >/=1.85 feet/sec in order to decrease risk for falls. Baseline: 1.45 ft/sec SPC SBA (1/14) Goal status: INITIAL  5.  Pt will increase BERG balance score to >/=51/56 to demonstrate improved static balance. Baseline: 45/56 (1/14) Goal status: INITIAL  6.  Pt will improve NDI score to </=12/50 in order to demonstrate improved pain management and functional capacity. Baseline: 17/50 Goal status: INITIAL  ASSESSMENT:  CLINICAL IMPRESSION: Emphasis of skilled PT session on expanding HEP to include high level strength and balance tasks.  She continues to have chronic bilateral knee valgus contributing to poor gait mechanics and RLE instability.  She has  noticeable weakness in her R ankle that she compensates for using toe extensors when trying to DF ankle.  DF is weaker than PF in her RLE though she is able to complete partial ROM when in sitting so encouraged use of bilateral integration and isolating R from L during HEP practice to improve motor output.  She would benefit from ongoing education on activity tolerance, safety, and progression to LRAD as able if gait mechanics improve.  Continue per POC.   OBJECTIVE IMPAIRMENTS: Abnormal gait, decreased activity tolerance, decreased balance, decreased coordination, decreased endurance, decreased mobility, difficulty walking, decreased strength, increased edema, impaired sensation, improper body mechanics, postural dysfunction, and pain.   ACTIVITY LIMITATIONS: carrying, lifting, bending, standing, squatting, stairs, transfers, and locomotion level  PARTICIPATION LIMITATIONS: shopping and community activity  PERSONAL FACTORS: Age, Fitness, Past/current experiences, Time since onset of injury/illness/exacerbation, and 1-2 comorbidities: lumbar fusion, OA are also affecting patient's functional outcome.   REHAB POTENTIAL: Good  CLINICAL DECISION MAKING: Evolving/moderate complexity  EVALUATION COMPLEXITY: Moderate  PLAN:  PT FREQUENCY: 1x/week  PT DURATION: 8 weeks  PLANNED INTERVENTIONS: [id number]- PT Re-evaluation, 97750- Physical Performance Testing, 97110-Therapeutic exercises, 97530- Therapeutic activity, [id number]- Neuromuscular re-education, 97535- Self Care, [id number]- Manual therapy, [id number]- Gait training, [phone]- Orthotic Initial, [phone]- Orthotic/Prosthetic subsequent, [phone]- Aquatic Therapy, [phone]- Electrical stimulation (manual), Patient/Family education, Balance training, Stair training, Taping, Joint mobilization, Vestibular training, DME instructions, Cryotherapy, and Moist heat  PLAN FOR NEXT SESSION: Expand formal HEP for strength and balance - supine and side-lying options?  Does she need R  foot-up/AFO/ankle strengthening (standing tilt board/airex)?  Gait training, balance, coordination - blaze pods, BLE/BUE strength and stretching; SciFit; cervical mobility prn, hip strengthening for knee valgus protection - standing hip mobility tasks; ASSESS STGs!   Daved KATHEE Bull, PT, DPT 03/28/2024, 12:09 PM        "

## 2024-04-04 ENCOUNTER — Ambulatory Visit: Admitting: Physical Therapy

## 2024-04-11 ENCOUNTER — Ambulatory Visit: Admitting: Occupational Therapy

## 2024-04-11 ENCOUNTER — Ambulatory Visit: Admitting: Physical Therapy

## 2024-04-12 ENCOUNTER — Ambulatory Visit: Admitting: Internal Medicine

## 2024-04-13 ENCOUNTER — Ambulatory Visit: Payer: Medicare PPO

## 2024-04-18 ENCOUNTER — Ambulatory Visit: Admitting: Physical Therapy

## 2024-04-18 ENCOUNTER — Ambulatory Visit: Admitting: Occupational Therapy

## 2024-04-25 ENCOUNTER — Ambulatory Visit: Admitting: Occupational Therapy

## 2024-04-25 ENCOUNTER — Ambulatory Visit: Admitting: Physical Therapy

## 2024-06-13 ENCOUNTER — Ambulatory Visit: Admitting: Podiatry
